# Patient Record
Sex: Male | Born: 1950
Health system: Southern US, Community
[De-identification: ages and names within clinical notes are randomized; demographics above are authoritative.]

## PROBLEM LIST (undated history)

## (undated) DIAGNOSIS — I219 Acute myocardial infarction, unspecified: Secondary | ICD-10-CM

## (undated) DIAGNOSIS — Z720 Tobacco use: Secondary | ICD-10-CM

## (undated) DIAGNOSIS — I714 Abdominal aortic aneurysm, without rupture, unspecified: Secondary | ICD-10-CM

## (undated) DIAGNOSIS — G4700 Insomnia, unspecified: Secondary | ICD-10-CM

## (undated) DIAGNOSIS — F329 Major depressive disorder, single episode, unspecified: Secondary | ICD-10-CM

## (undated) DIAGNOSIS — K259 Gastric ulcer, unspecified as acute or chronic, without hemorrhage or perforation: Secondary | ICD-10-CM

## (undated) DIAGNOSIS — D696 Thrombocytopenia, unspecified: Secondary | ICD-10-CM

## (undated) DIAGNOSIS — Z951 Presence of aortocoronary bypass graft: Secondary | ICD-10-CM

## (undated) DIAGNOSIS — I10 Essential (primary) hypertension: Secondary | ICD-10-CM

## (undated) DIAGNOSIS — F32A Depression, unspecified: Secondary | ICD-10-CM

## (undated) DIAGNOSIS — N200 Calculus of kidney: Secondary | ICD-10-CM

## (undated) DIAGNOSIS — I2510 Atherosclerotic heart disease of native coronary artery without angina pectoris: Secondary | ICD-10-CM

## (undated) DIAGNOSIS — K760 Fatty (change of) liver, not elsewhere classified: Secondary | ICD-10-CM

## (undated) DIAGNOSIS — F419 Anxiety disorder, unspecified: Secondary | ICD-10-CM

## (undated) DIAGNOSIS — E785 Hyperlipidemia, unspecified: Secondary | ICD-10-CM

## (undated) DIAGNOSIS — E119 Type 2 diabetes mellitus without complications: Secondary | ICD-10-CM

## (undated) DIAGNOSIS — N529 Male erectile dysfunction, unspecified: Secondary | ICD-10-CM

## (undated) HISTORY — PX: INGUINAL HERNIA REPAIR: SUR1180

## (undated) HISTORY — PX: LITHOTRIPSY: SUR834

## (undated) HISTORY — DX: Tobacco use: Z72.0

## (undated) HISTORY — DX: Depression, unspecified: F32.A

## (undated) HISTORY — DX: Abdominal aortic aneurysm, without rupture, unspecified: I71.40

## (undated) HISTORY — DX: Male erectile dysfunction, unspecified: N52.9

## (undated) HISTORY — DX: Major depressive disorder, single episode, unspecified: F32.9

## (undated) HISTORY — DX: Atherosclerotic heart disease of native coronary artery without angina pectoris: I25.10

## (undated) HISTORY — DX: Fatty (change of) liver, not elsewhere classified: K76.0

## (undated) HISTORY — DX: Abdominal aortic aneurysm, without rupture: I71.4

## (undated) HISTORY — DX: Calculus of kidney: N20.0

## (undated) HISTORY — DX: Hyperlipidemia, unspecified: E78.5

## (undated) HISTORY — PX: CORONARY ANGIOPLASTY WITH STENT PLACEMENT: SHX49

## (undated) HISTORY — DX: Thrombocytopenia, unspecified: D69.6

## (undated) HISTORY — DX: Insomnia, unspecified: G47.00

## (undated) HISTORY — DX: Anxiety disorder, unspecified: F41.9

---

## 1998-06-18 ENCOUNTER — Inpatient Hospital Stay (HOSPITAL_COMMUNITY): Admission: EM | Admit: 1998-06-18 | Discharge: 1998-06-21 | Payer: Self-pay | Admitting: Cardiology

## 2001-06-27 ENCOUNTER — Emergency Department (HOSPITAL_COMMUNITY): Admission: EM | Admit: 2001-06-27 | Discharge: 2001-06-27 | Payer: Self-pay | Admitting: Emergency Medicine

## 2001-06-27 ENCOUNTER — Encounter: Payer: Self-pay | Admitting: Emergency Medicine

## 2003-03-14 ENCOUNTER — Emergency Department (HOSPITAL_COMMUNITY): Admission: EM | Admit: 2003-03-14 | Discharge: 2003-03-14 | Payer: Self-pay | Admitting: *Deleted

## 2003-03-14 ENCOUNTER — Emergency Department (HOSPITAL_COMMUNITY): Admission: EM | Admit: 2003-03-14 | Discharge: 2003-03-15 | Payer: Self-pay | Admitting: *Deleted

## 2003-03-14 ENCOUNTER — Encounter: Payer: Self-pay | Admitting: *Deleted

## 2003-03-17 ENCOUNTER — Encounter: Payer: Self-pay | Admitting: Urology

## 2003-03-17 ENCOUNTER — Ambulatory Visit (HOSPITAL_COMMUNITY): Admission: RE | Admit: 2003-03-17 | Discharge: 2003-03-17 | Payer: Self-pay | Admitting: Urology

## 2003-03-22 ENCOUNTER — Encounter: Payer: Self-pay | Admitting: Urology

## 2003-03-22 ENCOUNTER — Ambulatory Visit (HOSPITAL_COMMUNITY): Admission: RE | Admit: 2003-03-22 | Discharge: 2003-03-22 | Payer: Self-pay | Admitting: *Deleted

## 2003-04-19 ENCOUNTER — Ambulatory Visit (HOSPITAL_COMMUNITY): Admission: RE | Admit: 2003-04-19 | Discharge: 2003-04-19 | Payer: Self-pay | Admitting: Urology

## 2003-04-19 ENCOUNTER — Encounter: Payer: Self-pay | Admitting: Urology

## 2003-04-26 ENCOUNTER — Encounter: Payer: Self-pay | Admitting: Urology

## 2003-04-26 ENCOUNTER — Ambulatory Visit (HOSPITAL_COMMUNITY): Admission: RE | Admit: 2003-04-26 | Discharge: 2003-04-26 | Payer: Self-pay | Admitting: Urology

## 2004-05-09 ENCOUNTER — Ambulatory Visit (HOSPITAL_COMMUNITY): Admission: RE | Admit: 2004-05-09 | Discharge: 2004-05-09 | Payer: Self-pay | Admitting: Family Medicine

## 2005-09-18 ENCOUNTER — Ambulatory Visit (HOSPITAL_COMMUNITY): Admission: RE | Admit: 2005-09-18 | Discharge: 2005-09-18 | Payer: Self-pay | Admitting: Family Medicine

## 2005-09-27 ENCOUNTER — Ambulatory Visit: Payer: Self-pay | Admitting: *Deleted

## 2005-09-28 ENCOUNTER — Ambulatory Visit (HOSPITAL_COMMUNITY): Admission: RE | Admit: 2005-09-28 | Discharge: 2005-09-28 | Payer: Self-pay | Admitting: *Deleted

## 2005-10-03 ENCOUNTER — Ambulatory Visit: Payer: Self-pay | Admitting: *Deleted

## 2005-10-09 ENCOUNTER — Ambulatory Visit (HOSPITAL_COMMUNITY): Admission: RE | Admit: 2005-10-09 | Discharge: 2005-10-09 | Payer: Self-pay | Admitting: General Surgery

## 2005-10-24 ENCOUNTER — Ambulatory Visit: Payer: Self-pay | Admitting: *Deleted

## 2006-01-09 ENCOUNTER — Encounter: Admission: RE | Admit: 2006-01-09 | Discharge: 2006-01-09 | Payer: Self-pay | Admitting: Oncology

## 2006-01-09 ENCOUNTER — Encounter (HOSPITAL_COMMUNITY): Admission: RE | Admit: 2006-01-09 | Discharge: 2006-02-08 | Payer: Self-pay | Admitting: Oncology

## 2006-01-09 ENCOUNTER — Ambulatory Visit (HOSPITAL_COMMUNITY): Payer: Self-pay | Admitting: Oncology

## 2006-02-15 ENCOUNTER — Emergency Department (HOSPITAL_COMMUNITY): Admission: EM | Admit: 2006-02-15 | Discharge: 2006-02-15 | Payer: Self-pay | Admitting: Emergency Medicine

## 2006-02-19 ENCOUNTER — Encounter: Admission: RE | Admit: 2006-02-19 | Discharge: 2006-02-19 | Payer: Self-pay | Admitting: Oncology

## 2006-02-19 ENCOUNTER — Ambulatory Visit (HOSPITAL_COMMUNITY): Admission: RE | Admit: 2006-02-19 | Discharge: 2006-02-19 | Payer: Self-pay | Admitting: Urology

## 2006-02-21 ENCOUNTER — Ambulatory Visit (HOSPITAL_COMMUNITY): Admission: RE | Admit: 2006-02-21 | Discharge: 2006-02-21 | Payer: Self-pay | Admitting: Urology

## 2006-02-26 ENCOUNTER — Ambulatory Visit (HOSPITAL_COMMUNITY): Payer: Self-pay | Admitting: Oncology

## 2006-02-26 ENCOUNTER — Ambulatory Visit (HOSPITAL_COMMUNITY): Admission: RE | Admit: 2006-02-26 | Discharge: 2006-02-26 | Payer: Self-pay | Admitting: Urology

## 2006-02-27 ENCOUNTER — Ambulatory Visit (HOSPITAL_COMMUNITY): Admission: RE | Admit: 2006-02-27 | Discharge: 2006-02-27 | Payer: Self-pay | Admitting: Urology

## 2006-03-01 ENCOUNTER — Ambulatory Visit (HOSPITAL_COMMUNITY): Admission: RE | Admit: 2006-03-01 | Discharge: 2006-03-01 | Payer: Self-pay | Admitting: Urology

## 2006-03-22 ENCOUNTER — Ambulatory Visit (HOSPITAL_COMMUNITY): Admission: RE | Admit: 2006-03-22 | Discharge: 2006-03-22 | Payer: Self-pay | Admitting: Urology

## 2006-04-03 ENCOUNTER — Ambulatory Visit: Payer: Self-pay | Admitting: Cardiology

## 2006-04-24 ENCOUNTER — Encounter (HOSPITAL_COMMUNITY): Admission: RE | Admit: 2006-04-24 | Discharge: 2006-05-24 | Payer: Self-pay | Admitting: Oncology

## 2006-04-24 ENCOUNTER — Encounter: Admission: RE | Admit: 2006-04-24 | Discharge: 2006-04-24 | Payer: Self-pay | Admitting: Oncology

## 2006-04-24 ENCOUNTER — Ambulatory Visit (HOSPITAL_COMMUNITY): Payer: Self-pay | Admitting: Oncology

## 2007-04-10 ENCOUNTER — Ambulatory Visit (HOSPITAL_COMMUNITY): Admission: RE | Admit: 2007-04-10 | Discharge: 2007-04-10 | Payer: Self-pay | Admitting: Family Medicine

## 2007-11-11 ENCOUNTER — Ambulatory Visit (HOSPITAL_COMMUNITY): Admission: RE | Admit: 2007-11-11 | Discharge: 2007-11-11 | Payer: Self-pay | Admitting: Family Medicine

## 2008-01-20 ENCOUNTER — Ambulatory Visit: Payer: Self-pay | Admitting: Cardiology

## 2008-01-20 ENCOUNTER — Inpatient Hospital Stay (HOSPITAL_COMMUNITY): Admission: AD | Admit: 2008-01-20 | Discharge: 2008-01-23 | Payer: Self-pay | Admitting: Internal Medicine

## 2008-01-22 ENCOUNTER — Encounter: Payer: Self-pay | Admitting: Internal Medicine

## 2008-01-23 ENCOUNTER — Encounter: Payer: Self-pay | Admitting: Internal Medicine

## 2008-01-23 ENCOUNTER — Ambulatory Visit: Payer: Self-pay | Admitting: Surgery

## 2008-01-29 ENCOUNTER — Ambulatory Visit: Payer: Self-pay | Admitting: Cardiology

## 2008-01-30 ENCOUNTER — Ambulatory Visit: Payer: Self-pay

## 2008-02-19 ENCOUNTER — Ambulatory Visit: Payer: Self-pay | Admitting: Cardiology

## 2008-03-17 ENCOUNTER — Ambulatory Visit: Payer: Self-pay

## 2008-04-06 ENCOUNTER — Inpatient Hospital Stay (HOSPITAL_COMMUNITY): Admission: EM | Admit: 2008-04-06 | Discharge: 2008-04-07 | Payer: Self-pay | Admitting: Emergency Medicine

## 2008-04-06 ENCOUNTER — Ambulatory Visit: Payer: Self-pay | Admitting: Cardiology

## 2008-04-07 ENCOUNTER — Encounter: Payer: Self-pay | Admitting: Cardiology

## 2008-04-12 ENCOUNTER — Emergency Department (HOSPITAL_COMMUNITY): Admission: EM | Admit: 2008-04-12 | Discharge: 2008-04-12 | Payer: Self-pay | Admitting: Emergency Medicine

## 2008-04-13 ENCOUNTER — Encounter: Payer: Self-pay | Admitting: Cardiology

## 2008-04-13 LAB — CONVERTED CEMR LAB
HCT: 45.9 %
Platelets: 105 10*3/uL

## 2008-04-17 ENCOUNTER — Emergency Department (HOSPITAL_COMMUNITY): Admission: EM | Admit: 2008-04-17 | Discharge: 2008-04-17 | Payer: Self-pay | Admitting: Emergency Medicine

## 2008-04-20 ENCOUNTER — Ambulatory Visit (HOSPITAL_COMMUNITY): Admission: RE | Admit: 2008-04-20 | Discharge: 2008-04-20 | Payer: Self-pay | Admitting: Family Medicine

## 2008-05-03 ENCOUNTER — Emergency Department (HOSPITAL_COMMUNITY): Admission: EM | Admit: 2008-05-03 | Discharge: 2008-05-03 | Payer: Self-pay | Admitting: Emergency Medicine

## 2008-05-11 ENCOUNTER — Ambulatory Visit: Payer: Self-pay | Admitting: Cardiology

## 2008-05-19 ENCOUNTER — Ambulatory Visit (HOSPITAL_COMMUNITY): Admission: RE | Admit: 2008-05-19 | Discharge: 2008-05-19 | Payer: Self-pay | Admitting: Urology

## 2008-05-26 ENCOUNTER — Ambulatory Visit (HOSPITAL_COMMUNITY): Admission: RE | Admit: 2008-05-26 | Discharge: 2008-05-26 | Payer: Self-pay | Admitting: Urology

## 2008-08-06 ENCOUNTER — Emergency Department (HOSPITAL_COMMUNITY): Admission: EM | Admit: 2008-08-06 | Discharge: 2008-08-06 | Payer: Self-pay | Admitting: Emergency Medicine

## 2008-08-13 ENCOUNTER — Emergency Department (HOSPITAL_COMMUNITY): Admission: EM | Admit: 2008-08-13 | Discharge: 2008-08-13 | Payer: Self-pay | Admitting: Emergency Medicine

## 2008-09-06 ENCOUNTER — Ambulatory Visit: Payer: Self-pay | Admitting: Cardiology

## 2008-11-25 IMAGING — CR DG ABDOMEN 1V
1 series · 1 of 1 positions shown · non-contrast
Comparison: 05/19/2008

CLINICAL DATA: Left ureteral calculus.

ABDOMEN - 1 VIEW

[view not recorded]
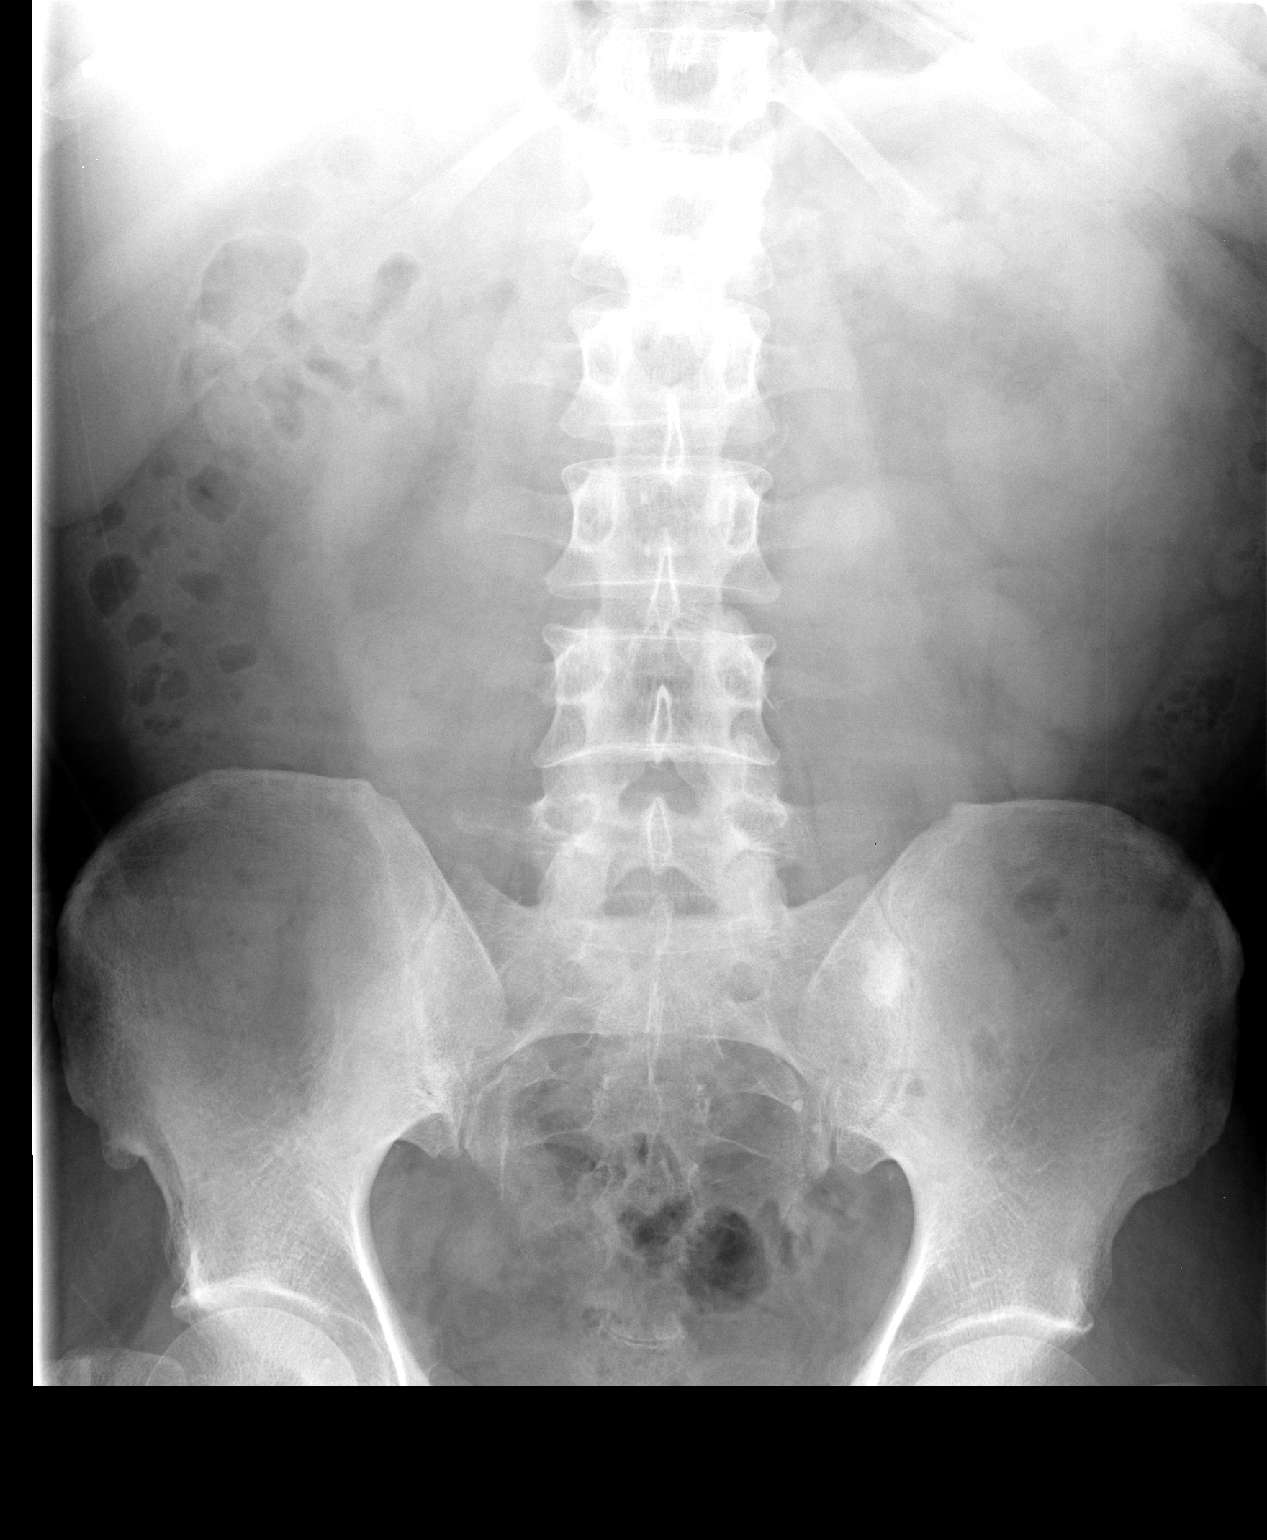

[1 of 1 positions shown; findings below may reference images not displayed]

FINDINGS: A calcific density seen in the left paraspinal region on
the prior study, is not well seen today.  Previously seen 5 x 6 mm
calcification in the left anatomic pelvis, adjacent to the left
sacral ala, is again seen.  The stones seen slightly more
inferiorly in the left anatomic pelvis on the prior study, are no
longer visualized.  There are phleboliths in the anatomic pelvis.
Bowel gas pattern unremarkable.
IMPRESSION: 1.  Of the three possible left ureteral stones seen on the prior
study, only one is still visualized, along the lateral aspect of
the left sacrum.

## 2009-03-10 ENCOUNTER — Encounter: Payer: Self-pay | Admitting: Cardiology

## 2009-03-11 LAB — CONVERTED CEMR LAB
Albumin: 4.4 g/dL (ref 3.5–5.2)
BUN: 18 mg/dL (ref 6–23)
CO2: 23 meq/L (ref 19–32)
Calcium: 9.5 mg/dL (ref 8.4–10.5)
Chloride: 106 meq/L (ref 96–112)
Glucose, Bld: 122 mg/dL — ABNORMAL HIGH (ref 70–99)
HDL: 44 mg/dL (ref >39)
Potassium: 4.7 meq/L (ref 3.5–5.3)
Triglycerides: 141 mg/dL (ref <150)

## 2009-03-14 ENCOUNTER — Encounter (INDEPENDENT_AMBULATORY_CARE_PROVIDER_SITE_OTHER): Payer: Self-pay | Admitting: *Deleted

## 2009-06-22 ENCOUNTER — Emergency Department (HOSPITAL_COMMUNITY): Admission: EM | Admit: 2009-06-22 | Discharge: 2009-06-23 | Payer: Self-pay | Admitting: Emergency Medicine

## 2009-06-25 ENCOUNTER — Emergency Department (HOSPITAL_COMMUNITY): Admission: EM | Admit: 2009-06-25 | Discharge: 2009-06-25 | Payer: Self-pay | Admitting: Emergency Medicine

## 2009-06-26 ENCOUNTER — Emergency Department (HOSPITAL_COMMUNITY): Admission: EM | Admit: 2009-06-26 | Discharge: 2009-06-26 | Payer: Self-pay | Admitting: Emergency Medicine

## 2009-11-03 ENCOUNTER — Ambulatory Visit (HOSPITAL_BASED_OUTPATIENT_CLINIC_OR_DEPARTMENT_OTHER): Admission: RE | Admit: 2009-11-03 | Discharge: 2009-11-03 | Payer: Self-pay | Admitting: Urology

## 2009-11-07 ENCOUNTER — Encounter: Payer: Self-pay | Admitting: Adult Health

## 2009-11-07 ENCOUNTER — Ambulatory Visit: Payer: Self-pay | Admitting: Cardiology

## 2009-11-07 DIAGNOSIS — I714 Abdominal aortic aneurysm, without rupture, unspecified: Secondary | ICD-10-CM

## 2009-11-07 HISTORY — DX: Abdominal aortic aneurysm, without rupture: I71.4

## 2009-11-07 HISTORY — DX: Abdominal aortic aneurysm, without rupture, unspecified: I71.40

## 2009-11-18 ENCOUNTER — Encounter: Payer: Self-pay | Admitting: Cardiology

## 2009-11-18 ENCOUNTER — Ambulatory Visit (HOSPITAL_COMMUNITY): Admission: RE | Admit: 2009-11-18 | Discharge: 2009-11-18 | Payer: Self-pay | Admitting: Cardiology

## 2009-11-18 ENCOUNTER — Ambulatory Visit: Payer: Self-pay | Admitting: Cardiology

## 2009-11-22 ENCOUNTER — Ambulatory Visit: Payer: Self-pay | Admitting: Cardiology

## 2010-06-09 ENCOUNTER — Encounter (INDEPENDENT_AMBULATORY_CARE_PROVIDER_SITE_OTHER): Payer: Self-pay | Admitting: *Deleted

## 2010-07-20 ENCOUNTER — Emergency Department (HOSPITAL_COMMUNITY)
Admission: EM | Admit: 2010-07-20 | Discharge: 2010-07-20 | Payer: Self-pay | Source: Home / Self Care | Admitting: Emergency Medicine

## 2010-07-28 ENCOUNTER — Encounter: Payer: Self-pay | Admitting: Cardiology

## 2010-07-28 LAB — CONVERTED CEMR LAB
Cholesterol: 144 mg/dL
LDL (calc): 86 mg/dL

## 2010-08-01 NOTE — Assessment & Plan Note (Signed)
Summary: f1y   Visit Type:  Follow-up Primary Provider:  Dr.Mark Nobie Putnam   History of Present Illness: Mr. Bearse is a 60 CM with known history of CAD, (s/p cardic cath in 7/09 with stent to the RCA using a BMS, with history of stent to LAD in 1999 found to be patent, with a 50-60% prox circumflex stenosis.), AAA with most recent abdominal study Dec 2010, revealing  3.6 cm focal abdominal aortic aneurysm at the distal abdominal aorta; the IMA arises from the aneurysmal dilatation; recent admission for renal calculi removal, diabetes, and anxiety.  During procedure to remove renal calculi, the patient states that the anesthesiologist thought that the EKG was concerning.  We, unfortunately do not have a copy of that recording.  He was told to make sure he followed up with his cardiologist.  He was due for an appt at this time and therefore is here for continued assessment.  He states that he has been having palpatations more frequently lately, and exertional chest discomfort over the last couple of months. However, he states that he rarely exerts himself.  He denies SOB, dizziness, radiation of discomfort.  No abdominal pain.   Current Medications (verified): 1)  Niacin 500 Mg Tabs (Niacin) .... Take 1 Tab Three Times A Day 2)  Glimepiride 4 Mg Tabs (Glimepiride) .... Take 1 Tab Daily 3)  Metoprolol Succinate 100 Mg Xr24h-Tab (Metoprolol Succinate) .... Take 1 Tab Daily 4)  Actoplus Met 15-500 Mg Tabs (Pioglitazone Hcl-Metformin Hcl) .... Take 1 Tab Two Times A Day 5)  Ramipril 10 Mg Caps (Ramipril) .... Take 1 Tab Daily 6)  Lipitor 10 Mg Tabs (Atorvastatin Calcium) .... Take 1 Tab Daily 7)  Norvasc 2.5 Mg Tabs (Amlodipine Besylate) .... Take 1 Tab Daily 8)  Aspir-Low 81 Mg Tbec (Aspirin) .... Take 1 Tab Daily 9)  Fish Oil 1000 Mg Caps (Omega-3 Fatty Acids) .... Take 1 Tab Two Times A Day  Allergies (verified): 1)  ! Demerol 2)  ! Phenergan (Promethazine Hcl)  Social History: Tobacco Use -  Yes  Alcohol Use - no Regular Exercise - no Drug Use - no  Review of Systems       The patient complains of chest pain.  The patient denies syncope.         Pain is usually with exertion. All other systems have been reviewed and are negative unless stated above.   Vital Signs:  Patient profile:   60 year old male Height:      71 inches Weight:      187 pounds BMI:     26.18 Pulse rate:   74 / minute BP sitting:   133 / 81  (right arm)  Vitals Entered By: Dreama Saa, CNA (Nov 07, 2009 4:02 PM)  Physical Exam  General:  Well developed, well nourished, in no acute distress. Neck:  Neck supple, no JVD. No masses, thyromegaly or abnormal cervical nodes. Lungs:  Clear bilaterally to auscultation and percussion. Heart:  RRR without MRG Abdomen:  No abdominal bruits are appreciated. Msk:  Back normal, normal gait. Muscle strength and tone normal. Extremities:  No clubbing or cyanosis. Neurologic:  Alert and oriented x 3. Psych:  Normal affect.   CT Scan  Procedure date:  06/25/2009  Findings:      IMPRESSION:CT SCAN PELVIS   Resolution of prior left hydronephrosis. Small nonobstructing left   renal calculi remain.  CT Scan  Procedure date:  06/25/2009  Findings:  IMPRESSION:CT SCAN ABDOMEN   2 mm calcification within the urinary bladder consistent with a   recently passed ureteral calculus presumably from the left.   EKG  Procedure date:  11/07/2009  Findings:       CLINICAL DATA:  A 60 year old gentleman with previous myocardial   infarction and percutaneous intervention, now admitted to hospital with   chest pain.   1. Treadmill exercise performed to a workload of 12.8 METS and a heart       rate of 163, 98% of age - predicted maximum.  Exercise discontinued       due to dyspnea and fatigue; no chest pain reported.   2. Blood pressure increased from a resting value of 145/80-180/80       during exercise and 210/70 early in recovery, a mildly  hypertensive       response.   3. No arrhythmias is noted.   4. EKG:  Sinus bradycardia; prominent QRS voltage; slightly delayed R-       wave progression; otherwise unremarkable.   Stress EKG:  Prominent, but insignificant, upsloping ST-segment   depression reaching a maximum of 2 mm in lead II.  Tracings reverted   towards normal within the first minute of recovery.   1. Baseline echocardiogram:  Normal chamber dimensions; no significant       valvular abnormalities; normal left ventricular size and function.      Post - exercise echocardiogram:  Increased contractility in all   myocardial segments.      IMPRESSION:  Negative stress echocardiogram revealing no significant   structural heart disease, good exercise tolerance, and neither   electrocardiographic nor echocardiographic evidence for myocardial   ischemia or infarction.  Other findings as noted.          Normal sinus rhythm with rate of:    Echocardiogram  Procedure date:  04/07/2008  Findings:       CLINICAL DATA:  A 60 year old gentleman with previous myocardial   infarction and percutaneous intervention, now admitted to hospital with   chest pain.   1. Treadmill exercise performed to a workload of 12.8 METS and a heart       rate of 163, 98% of age - predicted maximum.  Exercise discontinued       due to dyspnea and fatigue; no chest pain reported.   2. Blood pressure increased from a resting value of 145/80-180/80       during exercise and 210/70 early in recovery, a mildly hypertensive       response.   3. No arrhythmias is noted.   4. EKG:  Sinus bradycardia; prominent QRS voltage; slightly delayed R-       wave progression; otherwise unremarkable.   Stress EKG:  Prominent, but insignificant, upsloping ST-segment   depression reaching a maximum of 2 mm in lead II.  Tracings reverted   towards normal within the first minute of recovery.   1. Baseline echocardiogram:  Normal chamber dimensions; no significant         valvular abnormalities; normal left ventricular size and function.      Post - exercise echocardiogram:  Increased contractility in all   myocardial segments.      IMPRESSION:  Negative stress echocardiogram revealing no significant   structural heart disease, good exercise tolerance, and neither   electrocardiographic nor echocardiographic evidence for myocardial   ischemia or infarction.  Other findings as noted.            Impression &  Recommendations:  Problem # 1:  CORONARY ATHEROSCLEROSIS NATIVE CORONARY ARTERY (ICD-414.01) He complains of angina pain for the last couple of months.  I have discussed this with Dr. Dietrich Pates in the setting of known CAD with stents to the LAD and RCA with Cx disease.  After his assessment of the patient, review of EKG, we have elected to proceed with stress echo.  He knows to call us if chest pain becomes more prominent on occurs nonexertionally. His updated medication list for this problem includes:    Metoprolol Succinate 100 Mg Xr24h-tab (Metoprolol succinate) .Marland Kitchen... Take 1 tab daily    Ramipril 10 Mg Caps (Ramipril) .Marland Kitchen... Take 1 tab daily    Norvasc 2.5 Mg Tabs (Amlodipine besylate) .Marland Kitchen... Take 1 tab daily    Aspir-low 81 Mg Tbec (Aspirin) .Marland Kitchen... Take 1 tab daily  Orders: Stress Echo (Stress Echo)  Problem # 2:  ABDOMINAL AORTIC ANEURYSM (ICD-441.4) We have reviewed the most  3.6 cm focal abdominal aortic aneurysm at the distal abdominal   aorta; the IMA arises from the aneurysmal dilatation, increased in size from 3.1 in 2009.  We will continue to follow this.  No intervention at this time.  Patient Instructions: 1)  Your physician recommends that you schedule a follow-up appointment in: post stress echo 2)  Your physician recommends that you continue on your current medications as directed. Please refer to the Current Medication list given to you today. 3)  Your physician has requested that you have a stress echocardiogram. For further  information please visit [email or web address]  Please follow instruction sheet as given.

## 2010-08-01 NOTE — Letter (Signed)
Summary: Stress Echocardiogram Information Sheet  [facility]  [address], [place] [address]   Phone: [phone]  Fax: [phone]      Nov 07, 2009 MRN: [id number] light prior to the test.   Cain Sieve  Doctor: Appointment Date: Appointment Time: Appointment Location: [facility]  Stress Echocardiogram Information Sheet    Instructions:   1. DO NOT  take your Metoprolol, Glimepiride and Actoplus Met  the morning of test.  2. Eat light prior to the test.  3. Dress prepared to exercise.  4. DO NOT use ANY caffine or tobacco products 3 hours before appointment.  5. Report to the Short Stay Center on the1st floor.  6. Please bring all current prescription medications.  7. If you have any questions, please call [phone]

## 2010-08-01 NOTE — Assessment & Plan Note (Signed)
Summary: rov   Visit Type:  Follow-up Primary Provider:  Dr.Mark Nobie Putnam  CC:  no cardiology complaints.  History of Present Illness: [name] is a 68 CM with known history of CAD, (s/p cardic cath in 7/09 with stent to the RCA using a BMS, with history of stent to LAD in 1999 found to be patent, with a 50-60% prox circumflex stenosis.), AAA with most recent abdominal study Dec 2010, revealing  3.6 cm focal abdominal aortic aneurysm at the distal abdominal aorta; the IMA arises from the aneurysmal dilatation; recent admission for renal calculi removal, diabetes, and anxiety.  During procedure to remove renal calculi, the patient states that the anesthesiologist thought that the EKG was concerning.  We, unfortunately do not have a copy of that recording.  He was told to make sure he followed up with his cardiologist.    We saw him 1 week ago and planned a stress echo.  He continues to have "some" chest pain, with palpatations,.  He admits to being under a lot of stress with sister who is termally ill.  Current Medications (verified): 1)  Glimepiride 4 Mg Tabs (Glimepiride) .... Take 1 Tab Daily 2)  Metoprolol Succinate 100 Mg Xr24h-Tab (Metoprolol Succinate) .... Take 1 Tab Daily 3)  Actoplus Met 15-500 Mg Tabs (Pioglitazone Hcl-Metformin Hcl) .... Take 1 Tab Two Times A Day 4)  Ramipril 10 Mg Caps (Ramipril) .... Take 1 Tab Daily 5)  Lipitor 10 Mg Tabs (Atorvastatin Calcium) .... Take 1 Tab Daily 6)  Norvasc 2.5 Mg Tabs (Amlodipine Besylate) .... Take 1 Tab Daily 7)  Aspir-Low 81 Mg Tbec (Aspirin) .... Take 1 Tab Daily 8)  Fish Oil 1000 Mg Caps (Omega-3 Fatty Acids) .... Take 1 Tab Two Times A Day  Allergies (verified): 1)  ! Demerol 2)  ! Phenergan (Promethazine Hcl)  Review of Systems       Palpatations.  All other systems have been reviewed and are negative unless stated above.   Vital Signs:  Patient profile:   60 year old male Weight:      187 pounds BMI:     26.18 Pulse  rate:   75 / minute BP sitting:   130 / 68  (right arm)  Vitals Entered By: Dreama Saa, CNA (Nov 22, 2009 3:30 PM)  Physical Exam  General:  Well developed, well nourished, in no acute distress. Lungs:  Clear bilaterally to auscultation and percussion. Heart:  Non-displaced PMI, chest non-tender; regular rate and rhythm, S1, S2 without murmurs, rubs or gallops. Carotid upstroke normal, no bruit. Normal abdominal aortic size, no bruits. Femorals normal pulses, no bruits. Pedals normal pulses. No edema, no varicosities. Psych:  depressed affect.     Impression & Recommendations:  Problem # 1:  CORONARY ATHEROSCLEROSIS NATIVE CORONARY ARTERY (ICD-414.01) Review of the stress echo revealed no echocardiographic evidence for stress induced ischemia.  LVEF was 60-65%.  Reassurance was given to the patient concerning test results.  If he continues to have chest discomfort or worsening symptoms, will proceed with cardiac cath for further evaluation.  He wishes to wait to proceed with this until symptoms worsen. His updated medication list for this problem includes:    Metoprolol Succinate 100 Mg Xr24h-tab (Metoprolol succinate) .Marland Kitchen... Take 1 tab daily    Ramipril 10 Mg Caps (Ramipril) .Marland Kitchen... Take 1 tab daily    Norvasc 2.5 Mg Tabs (Amlodipine besylate) .Marland Kitchen... Take 1 tab daily    Aspir-low 81 Mg Tbec (Aspirin) .Marland Kitchen... Take 1 tab  daily  Problem # 2:  ANXIETY STATE, UNSPECIFIED (ICD-300.00) He admits to more stress in his life with sister who is terminal along with worries about his own health.  He is advised to take xanax which he has a prescription, on a as needed basis.  Counseling is also recommended if anxiety becomes unmanageble.  Patient Instructions: 1)  Your physician recommends that you schedule a follow-up appointment in: 6 months 2)  Your physician recommends that you continue on your current medications as directed. Please refer to the Current Medication list given to you today.

## 2010-08-01 NOTE — Letter (Signed)
Summary: Appointment - Reminder 2  [facility]. [address]   [place], [place] [address]   Phone: [phone]  Fax: [phone]     June 09, 2010 MRN: [id number]   [name] [address] [place], [place]  [address]   Dear [name],  Our records indicate that it is time to schedule a follow-up appointment.  Dr.  Dietrich Pates        recommended that you follow up with [place] in 11.2011 PAST DUE           . It is very important that we reach you to schedule this appointment. We look forward to participating in your health care needs. Please contact us at the number listed above at your earliest convenience to schedule your appointment.  If you are unable to make an appointment at this time, give [place] a call so we can update our records.     Sincerely,   Glass blower/designer

## 2010-08-09 ENCOUNTER — Encounter: Payer: Self-pay | Admitting: Cardiology

## 2010-08-09 ENCOUNTER — Ambulatory Visit (INDEPENDENT_AMBULATORY_CARE_PROVIDER_SITE_OTHER): Payer: PRIVATE HEALTH INSURANCE | Admitting: Cardiology

## 2010-08-09 DIAGNOSIS — D696 Thrombocytopenia, unspecified: Secondary | ICD-10-CM | POA: Insufficient documentation

## 2010-08-09 DIAGNOSIS — G4700 Insomnia, unspecified: Secondary | ICD-10-CM | POA: Insufficient documentation

## 2010-08-09 DIAGNOSIS — N200 Calculus of kidney: Secondary | ICD-10-CM

## 2010-08-09 DIAGNOSIS — N529 Male erectile dysfunction, unspecified: Secondary | ICD-10-CM | POA: Insufficient documentation

## 2010-08-09 DIAGNOSIS — E785 Hyperlipidemia, unspecified: Secondary | ICD-10-CM

## 2010-08-09 DIAGNOSIS — I2510 Atherosclerotic heart disease of native coronary artery without angina pectoris: Secondary | ICD-10-CM | POA: Insufficient documentation

## 2010-08-09 DIAGNOSIS — I10 Essential (primary) hypertension: Secondary | ICD-10-CM

## 2010-08-09 DIAGNOSIS — E1169 Type 2 diabetes mellitus with other specified complication: Secondary | ICD-10-CM | POA: Insufficient documentation

## 2010-08-09 DIAGNOSIS — E119 Type 2 diabetes mellitus without complications: Secondary | ICD-10-CM

## 2010-08-09 DIAGNOSIS — E782 Mixed hyperlipidemia: Secondary | ICD-10-CM

## 2010-08-09 HISTORY — DX: Hyperlipidemia, unspecified: E78.5

## 2010-08-09 HISTORY — DX: Thrombocytopenia, unspecified: D69.6

## 2010-08-09 HISTORY — DX: Type 2 diabetes mellitus without complications: E11.9

## 2010-08-09 HISTORY — DX: Calculus of kidney: N20.0

## 2010-08-17 ENCOUNTER — Telehealth (INDEPENDENT_AMBULATORY_CARE_PROVIDER_SITE_OTHER): Payer: Self-pay

## 2010-08-23 NOTE — Assessment & Plan Note (Signed)
Summary: [facility]   Visit Type:  Follow-up Primary Provider:  Robbie Lis   History of Present Illness: [name] returns to the office for continued assessment treatment of coronary disease and cardiovascular risk factors.  Since his last visit, he has done quite well with no chest discomfort and no dyspnea.  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  He has recently received alprazolam and zolpidem for treatment of anxiety and insomnia.  With the latter, he is able to fall asleep, but awakens in the early morning hours.  He has noted some pressure in his left upper quadrant at the costal margin that he attributes to irritation from his belt.  He was seen in the emergency department for an abdominal wall mass and told that he has a hernia.   Stress Echocardiogram  Procedure date:  11/18/2009  Findings:       Maximal heart rate during stress was 149bpm (91% of maximal predicted heart rate). The maximal predicted heart rate was 164bpm. The target heart rate was achieved. The heart rate response to stress was normal. There was a normal resting blood pressure with an appropriate response to stress. The rate-pressure product for the peak heart rate and blood pressure was Hg/min. The patient experienced no chest pain during stress. Functional capacity was normal.  Baseline:   - LV size was normal. Normal left atrium, aortic root, mitral valve,     aortic valve and RV.   - LV global systolic function was normal. The estimated LV ejection     fraction was 60% to 65%.   - Normal wall motion; no LV regional wall motion abnormalities.   Immediate post stress; treadmill 3.4mph, 14degrees; ; 3 min:   Study Conclusions    - Stress ECG conclusions: There were no significant stress     arrhythmias or conduction abnormalities. Occassional PVCs were     present, more frequent in early recovery. The stress ECG was     negative for  ischemia with insignificant rapidly upsloping ST     segment depression noted.   - Staged echo: There was no echocardiographic evidence for     stress-induced ischemia.    -  Date:  07/28/2010    Cholesterol: 144    LDL-calculated: 86    HDL: 34    Triglycerides: 696  Date:  04/13/2008    TSH: 0.62    WBC: 9.1    HGB: 15.4    HCT: 45.9    PLT: 105    MCV: 88   Current Medications (verified): 1)  Glimepiride 4 Mg Tabs (Glimepiride) .... Take 1 Tab Daily 2)  Metoprolol Succinate 100 Mg Xr24h-Tab (Metoprolol Succinate) .... Take 1 Tab Daily 3)  Actoplus Met 15-500 Mg Tabs (Pioglitazone Hcl-Metformin Hcl) .... Take 1 Tab Two Times A Day 4)  Ramipril 10 Mg Caps (Ramipril) .... Take 1 Tab Daily 5)  Lipitor 20 Mg Tabs (Atorvastatin Calcium) .... Take 1 Tablet By Mouth At Bedtime 6)  Norvasc 2.5 Mg Tabs (Amlodipine Besylate) .... Take 1 Tab Daily 7)  Aspir-Low 81 Mg Tbec (Aspirin) .... Take 1 Tab Daily 8)  Fish Oil 1000 Mg Caps (Omega-3 Fatty Acids) .... Take 1 Tab Two Times A Day 9)  Meloxicam 7.5 Mg Tabs (Meloxicam) .... Take 1 Tab Two Times A Day 10)  Alprazolam 0.5 Mg Tabs (Alprazolam) .... Take 1 Tab Three Times A Day 11)  Zolpidem Tartrate 10 Mg Tabs (Zolpidem  Tartrate) .... Take 1 Tab At Bedtime  Allergies (verified): 1)  ! Demerol 2)  ! Phenergan (Promethazine Hcl)  Comments:  Nurse/Medical Assistant: [facility] [place] also reviewed med from last ov  Past History:  PMH, FH, and Social History reviewed and updated.  Past Medical History: ASCVD: Acute MI requiring stent to LAD in 1999;  BMS to RCA in 12/2007, 60% cx+OM1 Abdominal aortic aneurysm: 3.6 cm in 06/2009 Hyperlipidemia Diabetes-no insulin Thrombocytopenia: 90,000 in 7/09 Tobacco abuse-40-50 pack years discontinued in 12/2007 Hepatic steatosis Erectile dysfunction Nephrolithiasis Anxiety/depression/insomnia  Past Surgical History: CABG Lithotripsy for  nephrolithiasis  Review of Systems       The patient complains of abdominal pain.  The patient denies weight loss, weight gain, decreased hearing, hoarseness, chest pain, syncope, dyspnea on exertion, peripheral edema, prolonged cough, and headaches.    Vital Signs:  Patient profile:   60 year old male Weight:      187 pounds BMI:     26.18 O2 Sat:      97 % on Room air Pulse rate:   74 / minute BP sitting:   144 / 85  (left arm)  Vitals Entered By: Dreama Saa, CNA (August 09, 2010 2:39 PM)  O2 Flow:  Room air  Physical Exam  General:  Mildly overweight; well developed; no acute distress:   Neck-No JVD; no carotid bruits: Lungs-No tachypnea, no rales; no rhonchi; no wheezes: Cardiovascular-normal PMI; normal S1 and S2: Abdomen-BS normal; soft and non-tender without masses or organomegaly:  Musculoskeletal-No deformities, no cyanosis or clubbing: Neurologic-Normal cranial nerves; symmetric strength and tone:  Skin-Warm, no significant lesions: Extremities-Nl distal pulses; no edema:     Impression & Recommendations:  Problem # 1:  ATHEROSCLEROTIC CARDIOVASCULAR DISEASE (ICD-429.2) [name] is asymptomatic with no apparent recurrent myocardial ischemia.  Treatment will continue to focus on optimal control of cardiovascular risk factors.  Problem # 2:  HYPERLIPIDEMIA (ICD-272.4) Recent lipid profile was good on just 10 mg of atorvastatin.  In order to lower LDL further, his dose will be increased to 20 mg q.d.  CHOL: 144 (07/28/2010)   LDL: 86 (07/28/2010)   HDL: 34 (07/28/2010)   TG: 124 (07/28/2010)  Problem # 3:  INSOMNIA (ICD-780.52) We discussed strategies for using alprazolam and zolpidem more effectively to treat his difficulty falling asleep and early morning awakening.  Problem # 4:  ? of ABDOMINAL WALL HERNIA (ICD-553.20) This appears to be a diastases recti on my exam.  I advised the patient that this is a normal variant without pathologic  significance.  I will reassess this nice gentleman in one year.  Other Orders: Durable Medical Equipment (DME)  Patient Instructions: 1)  Your physician recommends that you schedule a follow-up appointment in: 1 year 2)  Your physician has recommended you make the following change in your medication: Increase Lipitor to 20mg  by mouth at bedtime  3)  ***Please use Xanax and Zolpidem as discussed with Dr. Dietrich Pates*** 4)  You have been given a prescription for a Blood Glucosemeter machine to monitor BS daily Prescriptions: LIPITOR 20 MG TABS (ATORVASTATIN CALCIUM) take 1 tablet by mouth at bedtime  #30 x 12   Entered by:   Larita Fife Via LPN   Authorized by:   Kathlen Brunswick, MD, [facility]   Signed by:   Larita Fife Via LPN on 04/54/0981   Method used:   [facility]ically to        [facility] [phone]* (retail)  [address]       [place], [place]  [address]       Ph: [phone]       Fax: [phone]   RxID:   [id number]

## 2010-08-23 NOTE — Progress Notes (Signed)
**Note De-Identified Catlin Doria Obfuscation** Summary: NEED [facility] CALLED [facility]  Phone Note Call from Patient Call back at [phone]   Caller: PT WIFE SHARON Reason for Call: Talk to Nurse Summary of Call: WHEN PATIENT WAS HER LAST HE WAS GIVEN A PAPER TO BE ABLE TO GET FREE DIABETIC MEDS, BUT WHEN THEY GAVE IT TO THE PHARMACY THEY WERE TOLD THAT IT WAS NOT A RX AND NEED TO GET A RX CALLED IN. [facility] AND WANTS TO KNOW IF THEY WILL STILL BE FREE. Initial call taken by: Faythe Ghee,  August 17, 2010 11:13 AM  Follow-up for Phone Call        Marcelino Duster, [facility]ist at [facility], states that they do except DME orders for medical equipment and asked that we ask pt. to bring RX back to them and they will fill. Pt's wife, Jasmine December, advised. Follow-up by: Larita Fife Aleera Gilcrease LPN,  August 17, 2010 1:06 PM

## 2010-09-19 LAB — BASIC METABOLIC PANEL
CO2: 29 mEq/L (ref 19–32)
Calcium: 9.6 mg/dL (ref 8.4–10.5)
Chloride: 108 mEq/L (ref 96–112)
Creatinine, Ser: 1.04 mg/dL (ref 0.4–1.5)
Glucose, Bld: 81 mg/dL (ref 70–99)
Sodium: 144 mEq/L (ref 135–145)

## 2010-09-19 LAB — GLUCOSE, CAPILLARY: Glucose-Capillary: 132 mg/dL — ABNORMAL HIGH (ref 70–99)

## 2010-10-02 LAB — URINALYSIS, ROUTINE W REFLEX MICROSCOPIC
Bilirubin Urine: NEGATIVE
Bilirubin Urine: NEGATIVE
Glucose, UA: NEGATIVE mg/dL
Ketones, ur: NEGATIVE mg/dL
Leukocytes, UA: NEGATIVE
Leukocytes, UA: NEGATIVE
Nitrite: NEGATIVE
Nitrite: NEGATIVE
Protein, ur: NEGATIVE mg/dL
Specific Gravity, Urine: 1.015 (ref 1.005–1.030)
Specific Gravity, Urine: 1.02 (ref 1.005–1.030)
Urobilinogen, UA: 0.2 mg/dL (ref 0.0–1.0)
Urobilinogen, UA: 0.2 mg/dL (ref 0.0–1.0)
pH: 6 (ref 5.0–8.0)
pH: 6 (ref 5.0–8.0)

## 2010-10-02 LAB — BASIC METABOLIC PANEL
BUN: 14 mg/dL (ref 6–23)
CO2: 23 mEq/L (ref 19–32)
Calcium: 9.5 mg/dL (ref 8.4–10.5)
Creatinine, Ser: 0.99 mg/dL (ref 0.4–1.5)
GFR calc non Af Amer: >60 mL/min (ref >60)
Glucose, Bld: 153 mg/dL — ABNORMAL HIGH (ref 70–99)
Sodium: 140 mEq/L (ref 135–145)

## 2010-10-02 LAB — CBC
Hemoglobin: 15 g/dL (ref 13.0–17.0)
MCHC: 33.9 g/dL (ref 30.0–36.0)
Platelets: 88 10*3/uL — ABNORMAL LOW (ref 150–400)
RDW: 13.5 % (ref 11.5–15.5)

## 2010-10-02 LAB — DIFFERENTIAL
Basophils Absolute: 0 10*3/uL (ref 0.0–0.1)
Basophils Relative: 0 % (ref 0–1)
Lymphocytes Relative: 22 % (ref 12–46)
Neutro Abs: 3.4 10*3/uL (ref 1.7–7.7)
Neutrophils Relative %: 67 % (ref 43–77)

## 2010-10-02 LAB — URINE MICROSCOPIC-ADD ON

## 2010-10-17 LAB — GLUCOSE, CAPILLARY: Glucose-Capillary: 118 mg/dL — ABNORMAL HIGH (ref 70–99)

## 2010-10-17 LAB — URINALYSIS, ROUTINE W REFLEX MICROSCOPIC
Glucose, UA: NEGATIVE mg/dL
Leukocytes, UA: NEGATIVE
pH: 5.5 (ref 5.0–8.0)

## 2010-10-17 LAB — URINE MICROSCOPIC-ADD ON

## 2010-11-14 NOTE — Letter (Signed)
September 06, 2008    [name], MD  [address], [address]  [place], [place] [address]   RE:  KENLY, XIAO  MRN:  [id number]  /  DOB:  April 29, 1951   Dear Loraine Leriche,   Mr. Guerrette returns to the office for continued assessment and treatment  of coronary disease, now 10 years following acute myocardial infarction.  Unfortunately, he continues to smoke cigarettes, albeit at a reduced  rate.  His diabetes is controlled with a fairly low dose of a compound  agent.  His lipid profile has been good, but somewhat suboptimal with  elevated triglycerides and a low HDL.  Fortunately, his issues with  depression and anxiety have completely resolved.  He has returned to  work and has not required any psychoactive agents in recent months.  I  was called to give permission for him to stop antiplatelet agents for  lithotripsy, but he ended up passing small stones after temporary  treatment with Flomax.   PHYSICAL EXAMINATION:  GENERAL:  Pleasant gentleman in no acute  distress.  VITAL SIGNS:  The weight is 193, 5 pounds more than 4 months ago.  Blood  pressure 150/90, heart rate 64 and regular, respirations 14 and  unlabored.  NECK:  No jugular venous distention; no carotid bruits.  LUNGS:  Clear.  CARDIAC:  Normal first and second heart sounds, fourth heart sound  present.  ABDOMEN:  Soft and nontender; no masses; no bruits.  EXTREMITIES:  Normal distal pulses; no edema.   IMPRESSION:  Mr. Braniff is doing quite well with no symptomatic  manifestations of coronary disease.  I explained that his prognosis  would be much improved if he completely gave up tobacco products.  At  any rate, a substantial reduction is of some benefit.  Diabetic control  appears to be excellent - I have no fasting blood glucose values over  115.  You may wish to check a hemoglobin A1c level.  He has a minimal  abdominal aortic aneurysm that was checked twice in 2009 incidentally  during CT scans.  There has been no  change in the diameter, which is  around 3.2 cm.  Due to his young age, I would like for him to have a  lipid profile as close to optimal as possible.  We will start niacin 100  mg t.i.d. and attempt to titrate to 500 mg t.i.d.  A lipid profile will  be reassessed in 6 months with a return visit in 1 year.    Sincerely,      Gerrit Friends. Dietrich Pates, MD, [facility]  Electronically Signed    RMR/MedQ  DD: 09/06/2008  DT: 09/07/2008  Job #: [id number]

## 2010-11-14 NOTE — H&P (Signed)
[name] Kitchen  Guy, Thompson NO.:  [id number]   MEDICAL RECORD NO.:  [id number]          PATIENT TYPE:  INP   LOCATION:  2903                         FACILITY:  [facility]   PHYSICIAN:  Guy Friends. Dietrich Pates, MD, FACCDATE OF BIRTH:  02/11/1951   DATE OF ADMISSION:  01/20/2008  DATE OF DISCHARGE:                              HISTORY & PHYSICAL   PRIMARY CARDIOLOGIST:  Guy Friends. Dietrich Pates, MD, [place].   PRIMARY CARE PHYSICIAN:  [name], M.D.   HISTORY OF PRESENT ILLNESS:  A 60 year old Caucasian male with known  history of CAD, stent to the LAD in 1999, status post MI in 1999.  He  also has a history of diabetes.  Today, while eating an early dinner, he  felt pressure in his chest radiating into both arms with no associated  symptoms of shortness of breath, nausea, vomiting, or diaphoresis.  He  admits to having chest pain and pressure over the last couple of months  with and without exertion, usually feels it in the evening.  As a result  of this, he went to [facility] emergency room, at which time the patient  had cardiac markers cycled along with other labs, also had an EKG  completed, which revealed normal sinus rhythm without evidence of acute  ischemic event.  The patient was given Lovenox subcutaneously and  nitroglycerin sublingual and placed on a nitroglycerin patch 1-inch, and  transferred to [facility] after talking with Dr. Arvilla Thompson and accepting the patient with need for cardiac  catheterization.   REVIEW OF SYSTEMS:  Positive for chest pain, mild shortness of breath,  and generalized fatigue, otherwise negative for nausea, vomiting,  diaphoresis, or syncope.  Review of systems has been examined and found  to be negative.   PAST MEDICAL HISTORY:  Myocardial infarction in 1999.  The patient also  has a history of a stent to his LAD in 1999 as well.  He has a history  of nephrolithiasis, with lithotripsy.  He also has a history of non-  insulin-dependent diabetes, thrombocytopenia, and a triple A which is  being followed by Dr. Nobie Thompson and was recently evaluated per ultrasound  within the last 6 months.   CURRENT LABORATORIES:  Sodium 139, potassium 3.8, chloride 107, CO2 of  25, glucose 127, BUN 19, creatinine 1.13, and calcium 9.5.  Hemoglobin  15.6, hematocrit 46.0, white blood cells 7.2, and platelets 101.  EKG  revealing normal sinus rhythm without acute ST-T wave abnormalities.   CURRENT MEDICATIONS:  1. Aspirin 325 mg daily.  2. Atorvastatin 10 mg at bedtime.  3. Metoprolol 100 mg daily.  4. Actos/metformin 15/500 mg daily.  5. Ramipril 10 mg b.i.d.   ALLERGIES:  DEMEROL and PHENERGAN.   SOCIAL HISTORY:  The patient lives in [place] with his wife.  He is a  Emergency planning/management officer.  He is a 58-pack-year smoker.  Negative EtOH or drug  use.   FAMILY HISTORY:  Diabetes on his mother's side and father deceased from  lung disease.  He has siblings with no history of cardiac disease.   PHYSICAL  EXAMINATION:  GENERAL:  The patient is awake, alert, and  oriented in no acute distress.  HEENT:  Head is normocephalic and atraumatic.  Eyes, PERRLA.  Mucous  membranes of mouth pink and moist.  Tongue is midline.  NECK:  Supple without JVD or carotid bruits appreciated.  CARDIOVASCULAR:  Regular rate and rhythm without murmurs, rubs, or  gallops.  LUNGS:  Clear to auscultation.  ABDOMEN:  Soft, nontender, 2+ bowel sounds.  EXTREMITIES:  Without clubbing, cyanosis, or edema.  NEUROLOGIC:  Cranial nerves II through XII are grossly intact.   IMPRESSION:  1. Chest pain worrisome for angina in a patient with known coronary      artery disease with stent to the left anterior descending with no      EKG changes at present.  2. History of diabetes.  3. History of thrombocytopenia.  4. History of abdominal aortic aneurysm.   PLAN:  The patient has been transferred from [facility] and  will be prepared for cardiac  catheterization in the a.m.  Risks,  benefits, and description of the procedure have been discussed with the  patient and his wife.  They verbalize understanding and are willing to  proceed.  The patient in the interim will be monitored closely on  telemetry with continued lab work.  We will hold his metformin secondary  to catheterization in the morning and make further recommendation based  upon results of catheterization and the patient's response during  hospitalization.      Guy Mare. Lyman Bishop, NP      Guy Friends. Dietrich Pates, MD, [facility]  Electronically Signed    KML/MEDQ  D:  01/20/2008  T:  01/21/2008  Job:  [id number]   cc:   [name], M.D.

## 2010-11-14 NOTE — Cardiovascular Report (Signed)
NAME:  [name], [name] NO.:  [id number]   MEDICAL RECORD NO.:  [id number]          PATIENT TYPE:  INP   LOCATION:  3740                         FACILITY:  [facility]   PHYSICIAN:  Marca Ancona, MD      DATE OF BIRTH:  01-04-1951   DATE OF PROCEDURE:  01/21/2008  DATE OF DISCHARGE:                            CARDIAC CATHETERIZATION   I did this case with Dr. Shawnie Pons as a proctor.   PROCEDURE:  Left heart catheterization and left ventriculography.   INDICATION:  Unstable angina.   PROCEDURE NOTE:  After informed consent was obtained, the right groin  was sterilely prepped and draped.  1% lidocaine was used to locally  anesthetize the right groin site.  The right common femoral artery was  entered using Seldinger technique and a 5-French sheath was placed in  the artery.  The left coronary artery was engaged with the 5-French JL4  catheter and the right coronary artery was engaged with the 5-French JR4  catheter and the ventricle was entered using the 5-French pigtail  catheter.  There were no complications.   FINDINGS:  1. Left ventriculography, EF was estimated at 65%.  There was normal      wall motion.  There was 1+ mitral regurgitation.  LVEDP was 18      mmHg.  2. Coronary angiography.  The patient has a right dominant system.      The left main coronary artery had mild 20% distal stenosis.  There      was an approximately 50% proximal LAD stenosis just proximal to the      prior placed proximal LAD stent.  Within the proximal LAD stent,      there was approximately 30% in-stent restenosis.  There was 60%      ostial circumflex stenosis, which also involved the ostium of a      high obtuse marginal with approximately 60% stenosis as well.  The      RCA had a 99% midvessel stenosis, this is a long lesion.  There      were left to right collaterals.   ASSESSMENT:  There is critical mid RCA stenosis, this is likely the  patient's culprit lesion.  This  case is complicated by the patient's  history of chronic thrombocytopenia with the platelets being 101,000  today.   PLAN:  The case was discussed with the interventional cardiologist, Dr.  Riley Kill and we planned a percutaneous coronary intervention to the right  coronary artery.  Given the patient's history of low platelets, we  elected to place a bare-metal stent to allow a lesser absolute duration  of Plavix.   This heart catheterization was done under Dr. Shawnie Pons as a  proctor.      Marca Ancona, MD  Electronically Signed     DM/MEDQ  D:  01/21/2008  T:  01/22/2008  Job:  [id number]

## 2010-11-14 NOTE — Consult Note (Signed)
NAME:  NOBLE, CICALESE NO.:  [id number]   MEDICAL RECORD NO.:  [id number]          PATIENT TYPE:  EMS   LOCATION:  ED                            FACILITY:  [facility]   PHYSICIAN:  Gerrit Friends. Dietrich Pates, MD, FACCDATE OF BIRTH:  1950/08/14   DATE OF CONSULTATION:  04/06/2008  DATE OF DISCHARGE:                                 CONSULTATION   PRIMARY CARE PHYSICIAN:  [name], M.D.   CARDIOLOGIST:  Gerrit Friends. Dietrich Pates, MD.   REASON FOR REFERRAL:  Chest pain.   HISTORY OF PRESENT ILLNESS:  Mr. Gassett is a 60 year old male patient  with a history of coronary artery disease status post remote stenting in  1999 who recently presented with what sounds like non-ST-elevation  myocardial infarction.  He was treated at [facility] in July  2009.  It should be noted that our computer system is currently down and  we do not have complete access to all of his records.  I did obtain a  cath report that demonstrated 50% proximal LAD stenosis at that time  with patent stent, 50-60% proximal circumflex stenosis and mid RCA that  was subtotally occluded with collateralization.  This was treated with a  bare-metal stent to the RCA.  His EF was well preserved.  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  He has had serial followup arterial Dopplers in our  [place] office.  Those have been stable over time.  The patient  recently developed some flu-like symptoms with fevers and cough.  His  cough has been fairly nonproductive.  He saw a physician at an urgent  care facility late last week who treated him empirically with Tamiflu.  The patient complained of significant malaise throughout the weekend,  but is overall feeling better from an infectious symptom standpoint.  He  does note a history of chest pain over the last 3-4 days.  This has only  been with lying down at night.  He can only describe it as a discomfort.  At its worst, it has been 1-2/10 on a pain scale.  He denies any  associated dyspnea, nausea or diaphoresis.  His symptoms last throughout  the night and they are  intermittent.  He denies any aggravating or  alleviating factors.  He denies any exertional pain.  Last evening, he  could not sleep.  He was worried about his symptoms.  He did come to our  office, but after he was told that the schedule was full, he went to the  emergency room.  The ER physician reports that his symptoms resolved  with sublingual nitroglycerin.  The patient notes that his symptoms are  not entirely similar to what he had in July.  In July before his  myocardial infarction, he did have some exertional symptoms and he had  continuous chest pain, and he presented to the emergency room.  It  should be noted that his present symptoms were not made any worse by his  recent flu-like symptoms.   PAST MEDICAL HISTORY:  1. Coronary  disease as outlined above.  2. In addition, he has a history of diabetes mellitus.  3. Hyperlipidemia.  4. Hypertension.  5. He has a significant history of nephrolithiasis and has undergone      lithotripsy many times in the past.  6. He also has a history of thrombocytopenia.  This has been      previously evaluated by Dr. Mariel Sleet.  7. He is status post right inguinal hernia repair in the past.   HOME MEDICATIONS:  1. Aspirin 81 mg daily.  2. Glimepiride 4 mg daily.  3. Metoprolol 50 mg 3 times daily.  4. Actoplus/MET 15/500 mg b.i.d.  5. Lipitor 10 mg daily.  6. Ramipril 10 mg daily.  7. Fish oil 1 gm b.i.d.   ALLERGIES:  He thinks he has had some sort of reaction to [facility] in  the past.   SOCIAL HISTORY:  The patient lives in [place] with his wife.  He has  3 children.  He has a 35 pack year history of smoking and quit in July  2009.  He denies alcohol or drug abuse.  He works as a Psychologist, forensic at  [employer].   FAMILY HISTORY:  Insignificant for premature CAD.   His mother is still  alive at age 30.  His father died at age 8 from some type of lung  issue.  Multiple family members have diabetes mellitus.   REVIEW OF SYSTEMS:  Please see HPI.  He does have noted fevers, but has  not recorded his temperature.  He denies headaches or sore throat.  He  does note sinus congestion.  He denies rash.  He denies dysuria or  hematuria.  He does note fatigue.  He denies bright red blood per rectum  or melena.  He denies dysphagia or belching.  He does note some  cervicalgia which is worse with moving his head to the right.  He denies  orthopnea, PND or pedal edema.  He denies syncope.  He does note he has  been under quite a bit of stress recently.  He is concerned about dying.  He is also concerned about his stent closing off.  He has been on report  through the police department for some type of incident recently.  He is  concerned that most of his symptoms may be due to anxiety at this time.  Rest of the review of systems are negative.   PHYSICAL EXAMINATION:  GENERAL:  He is a well-nourished, well-developed  male in no acute distress.  VITAL SIGNS:  Blood pressure upon presentation in the emergency room  149/83, pulse 59, respirations 20, temperature not recorded, oxygen  saturation 97% on room air.  HEENT:  Normal.  NECK:  Without JVD or lymphadenopathy.  ENDOCRINE:  Without thyromegaly.  CARDIAC:  Normal S1 and S2, regular rate and rhythm without murmur.  LUNGS:  Clear to auscultation bilaterally.  SKIN:  Without rash.  ABDOMEN:  Soft, nontender with normoactive bowel sounds.  No  organomegaly.  EXTREMITIES:  No clubbing, cyanosis or edema.  MUSCULOSKELETAL:  Without joint deformity.  NEUROLOGIC:  He is alert and oriented x3.  Cranial nerves II- XII are  grossly intact.  VASCULAR:  He has no carotid artery bruits noted  bilaterally.  His dorsalis pedis and posterior tibialis pulses are 2+ on  the left.  His dorsalis pedis is trace to 1+ on the  right, his posterior  tibialis pulses are 2+ on the right.   DIAGNOSTICS:  1. Chest x-ray is pending.  2. EKG:  Normal sinus rhythm with heart rate of 61, normal axis, no      acute changes.   LABORATORY DATA:  White count 6100, hemoglobin 15, platelet count  116,000, sodium 140, potassium 4.4, BUN 14, creatinine 1.09, glucose  122.  Cardiac markers pending.   ASSESSMENT/PLAN:  1. Chest pain.  His symptoms are somewhat atypical.  He has also had      recent URI symptoms and has been treated empirically for the flu.      He also has a great deal of anxiety.  His symptoms are not entirely      like his previous angina.  The patient should be observed overnight      at [facility] and ruled out for myocardial infarction.  If      he does rule out for myocardial infarction, then we could plan on a      stress echocardiogram in the morning.  Proton pump inhibitor will      be added to his medical therapy to cover for a GI etiology.  2. Coronary artery disease status post bare-metal stenting to the      right coronary artery in July 2009 in the setting of what sounds      like a non-ST elevation myocardial infarction.  He will be      continued on aspirin and beta blocker therapy at this time.  3. Hyperlipidemia.  He will be continued on Lipitor.  4. Diabetes mellitus.  His oral hypoglycemics will be continued per      his primary care service.  5. Thrombocytopenia.  This has been evaluated in the past by Dr.      Mariel Sleet.  His platelet count seems to be stable.  6. 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  This seems to be stable overall.  He has been followed      closely by Dr. Riley Kill in [place].   DISPOSITION:  Thank you very much for consultation.  We will follow the  patient throughout the remainder of this admission.      Tereso Newcomer, PA-C      Gerrit Friends. Dietrich Pates, MD, [facility]  Electronically Signed    SW/MEDQ  D:   04/06/2008  T:  04/06/2008  Job:  [id number]   cc:   Rhae Lerner. Margretta Ditty, M.D.  [address]  [place]  [place] [address]

## 2010-11-14 NOTE — Letter (Signed)
May 11, 2008    [name], MD  [address], [address]  [place], [place] [address]   RE:  [name], [name]  MRN:  [id number]  /  DOB:  [birth year]   Dear [name],   [name] returns to the office for continued assessment and treatment  of coronary artery disease today.  As you know, he has had a very  difficult time of that in recent weeks.  He has had an episode  consistent with influenza for which he received antiviral agents.  He  has completely recovered from those symptoms.  He subsequently developed  severe anxiety and has been unable to work.  He was seen by a  psychiatrist at [facility] on one occasion, but was unable to  appear for his scheduled followup visit.  He has improved substantially  with alprazolam 0.5 mg q.i.d. and Lexapro 10 mg daily.  He reports no  chest discomfort or dyspnea.  He developed flank pain and was found to  have recurrent nephrolithiasis.  He is scheduled to see Dr. Rito Ehrlich  tomorrow for further evaluation of that issue.   CURRENT MEDICATIONS:  1. Glimepiride 4 mg daily.  2. Metoprolol 50 mg b.i.d.  3. Actos/metformin 15/500 mg b.i.d.  4. Ramipril 10 mg daily.  5. Atorvastatin 10 mg daily.  6. Aspirin 81 mg daily.  7. Fish oil 4000 mg daily.  8. Lexapro 10 mg daily.  9. Flomax 0.4 mg nightly.  10.Alprazolam 0.5 mg q.6 h.   PHYSICAL EXAMINATION:  GENERAL:  Pleasant gentleman in no acute  distress.  VITAL SIGNS:  The weight is 188, stable.  Blood pressure 135/80, heart  rate 65 and regular, and respirations 14.  NECK:  No jugular venous distention; normal carotid upstrokes without  bruits.  LUNGS:  Clear.  CARDIAC:  Normal first and second heart sounds; fourth heart sound  present.  ABDOMEN:  Soft and nontender; no bruits; no masses; no organomegaly.  EXTREMITIES:  No edema.   A recent lipid profile was obtained at work.  This shows a total  cholesterol 136, HDL 41, triglycerides of 208, and LDL of 53.   IMPRESSION:  Mr.  Thompson is doing well from a cardiovascular standpoint.  Unfortunately, he has numerous other health challenges.  I encouraged  him to schedule a followup with his psychiatrist.  He appears to be  doing well with his current therapy, but wonders if he should be  working.  I told him that I was afraid that there would be significant  liability performing his work while taking moderate doses of  benzodiazepines.  He will follow up with you for these issues.  Control  of hyperlipidemia and other cardiovascular risk factors is good.  I will  reassess this nice gentleman in 4 months.    Sincerely,      Gerrit Friends. Dietrich Pates, MD, [facility]  Electronically Signed    RMR/MedQ  DD: 05/11/2008  DT: 05/12/2008  Job #: [id number]

## 2010-11-14 NOTE — Cardiovascular Report (Signed)
NAME:  [name], [name] NO.:  [id number]   MEDICAL RECORD NO.:  [id number]          PATIENT TYPE:  INP   LOCATION:  2903                         FACILITY:  [facility]   PHYSICIAN:  Arturo Morton. Riley Kill, MD, FACCDATE OF BIRTH:  02/09/1951   DATE OF PROCEDURE:  DATE OF DISCHARGE:                            CARDIAC CATHETERIZATION   INDICATIONS:  Mr. Strohecker is a 60 year old who underwent diagnostic  cardiac catheterization by Dr. Marca Ancona.  This revealed  approximately 50% proximal LAD disease with a patent stent previously  placed in 1999.  He had probably 50-60% proximal circumflex stenosis as  well.  There was subtotal occlusion of the mid right coronary with left  to right collaterals and preserved overall LV function.  The patient had  a platelet count of 101,000.  The patient tells me that he has had a  workup by Dr. Mariel Sleet.  We called Dr. Thornton Papas office.  He has not  had inordinate bleeding.  The patient has remained generally stable.  As  a result, and given his presentation with non-ST-elevation MI, it was  felt that we should proceed with percutaneous intervention.  This was  discussed with the patient and the patient was agreeable to proceed.   PROCEDURE:  Percutaneous stenting of the right coronary artery using a  non-drug-eluting platform.   DESCRIPTION OF PROCEDURE:  The procedure was performed by Dr. Clifton James  with me in attendance.  The right sheath was in place, bivalirudin was  given according to protocol, and oral clopidogrel was administered.  The  patient had previous chewable aspirin.  A right guiding catheter was  utilized, JR-4.  A Cougar wire was used to cross into the distal vessel.  There was nice crossing and a 2.25 x 12 apex balloon was then passed  across the lesion and the lesion was predilated.  There was a nice  angiographic appearance.  We then passed a 15 x 2.75 mm Multi-Link  Vision stent.  This was deployed at between 14 and 15  atmospheres with  an excellent appearance.  A 3.25 x 12 [place] balloon was then taken to the  lesion, and deployed at 16 atmospheres.  There was a nice step-up and  step-down with an excellent angiographic appearance.  Following this,  Dr. Clifton James prepped the right groin.  A femoral angiogram was  performed.  An Angio-Seal closure device was then deployed, but  continued to ooze after the deployment.  Direct manual compression was  then applied to the site, and the patient taken to the holding area  where Dr. Clifton James accompanied the patient to continue direct manual  compression.   ANGIOGRAPHIC DATA:  The right coronary artery demonstrates a subtotal  occlusion near the junction of the proximal and mid vessel.  Just distal  to this, there is diffuse luminal irregularity with multiple areas of 20-  30% narrowing.  In addition, as noted with a diagnostic study, there are  some diffuse distal luminal irregularities.  No  critical obstruction is noted.  Following percutaneous intervention,  there is a step-up and step-down with a negative 10%  stenosis.  Final  MLD is optimal.  There is no evidence of edge tear.   The patient will be continued to be held in the holding area.      Arturo Morton. Riley Kill, MD, [facility]  Electronically Signed     TDS/MEDQ  D:  01/21/2008  T:  01/22/2008  Job:  [id number]   cc:   [name], M.D.  Gerrit Friends. Dietrich Pates, MD, [facility]

## 2010-11-14 NOTE — H&P (Signed)
NAME:  [name], [name]               ACCOUNT NO.:  [id number]   MEDICAL RECORD NO.:  [id number]          PATIENT TYPE:  AMB   LOCATION:  DAY                           FACILITY:  [facility]   PHYSICIAN:  Ky Barban, M.D.DATE OF BIRTH:  26-Jan-1951   DATE OF ADMISSION:  DATE OF DISCHARGE:  LH                              HISTORY & PHYSICAL   CHIEF COMPLAINT:  Left renal colic.   HISTORY:  This patient who has history of multiple kidney stones went to  the emergency room on November 2 at [facility].  He is a 60 years old  with a left renal colic.  CT scan that showed there were 2 stones in the  left ureter with distal stone measuring 5 x 7 x 9 at the level of the  iliac crossover, moderate left hydroureter.  There is a 5-mm stone in  the left inter-pole collecting system.  A 2-mm nonobstructing calculus  in the right renal collecting system.  So, in the past, he had stone  basket and lithotripsies done.  I have told him that I would recommend  to go ahead and have ESL done, so we will try to break the lower stone  on the left ureter to see if it comes out.   PAST HISTORY:  He has non-insulin-dependent diabetes and also has open  heart surgery.  He had myocardial infarction in 1998, he subsequently  had a coronary angioplasty done with stent placement.  He was taking  blood thinner, but it has been discontinued, and the only medicine he is  taking is aspirin.  I have called his cardiologist and spoke to Dr.  Dietrich Pates, he said that it is okay to proceed and do his lithotripsy and  he assured me he is not taking any Plavix or any other blood thinner.  So we have discontinued his aspirin 3 days before his scheduled ESL.   ALLERGIES:  He is allergic to DEMEROL and PHENERGAN.   PERSONAL HISTORY:  Does not smoke or drink.   REVIEW OF SYSTEMS:  Unremarkable.   PHYSICAL EXAMINATION:  GENERAL:  Well-nourished, well-developed male not  in acute distress, fully conscious, alert, oriented.  VITAL SIGNS:  Blood pressure is 130/80 and temperature is normal.  CENTRAL NERVOUS SYSTEM:  Negative.  HEAD, NECK, EYE, AND ENT:  Negative.  CHEST:  Symmetrical.  HEART:  Regular sinus rhythm, no murmur.  ABDOMEN:  Soft and flat.  Liver, spleen, and kidneys are not palpable.  No CVA tenderness  EXTERNAL GENITALIA:  Unremarkable.   IMPRESSION:  1. Left distal ureteral calculi, left renal calculus, and has tiny      right renal calculus.  2. Non-insulin-dependent diabetes.   PLAN:  ESL, distal left ureteral calculus.      Ky Barban, M.D.  Electronically Signed     MIJ/MEDQ  D:  05/18/2008  T:  05/19/2008  Job:  [id number]

## 2010-11-14 NOTE — H&P (Signed)
NAME:  [name], [name] NO.:  [id number]   MEDICAL RECORD NO.:  [id number]          PATIENT TYPE:  INP   LOCATION:  A303                          FACILITY:  [facility]   PHYSICIAN:  Skeet Latch, DO    DATE OF BIRTH:  Sep 30, 1950   DATE OF ADMISSION:  04/06/2008  DATE OF DISCHARGE:  LH                              HISTORY & PHYSICAL   PRIMARY CARE PHYSICIAN:  Dr. Nobie Putnam.   PRIMARY CARDIOLOGIST:  Dr. Dietrich Pates.   HISTORY OF PRESENT ILLNESS:  This is a 60 year old Caucasian male who  has a history of coronary artery disease with 2 stents, 1 in 1999, last  one done this past summer, who presents with some chest discomfort.  The  patient was treated at [facility] back in January 2009 with I  believe chest pain.  Had his second stent placed.  The EF seems to be  preserved.  The patient states that he had some dull chest discomfort  the last 2 days, has progressively gotten worse.  The patient states,  last night, he could not sleep, upset worrying about his history of  coronary artery disease with stent placement, and decided to come to the  emergency room to be evaluated.  The patient also complains of flu-like  symptoms and went to Urgent Care 4 to 5 days ago.  For his flu-like  symptoms was placed on Tamiflu, and states that his flu-like symptoms  have improved.  On my exam, the patient states that he still has some  discomfort, but rates it 1/10.   PAST MEDICAL HISTORY:  Includes coronary artery disease with stent  placement, hyperlipidemia, diabetes, hypertension, nephrolithiasis,  thrombocytopenia.   PAST SURGICAL HISTORY:  Two stent placements, hernia right inguinal  repair, multiple lithotripsies.   HOME MEDICATIONS:  1. Aspirin 81 mg daily.  2. Loperamide 40 mg daily.  3. Metoprolol tartrate 50 mg t.i.d.  4. Actos Plus Met 50/500 mg twice a day.  5. Ramipril 10 mg daily.  6. Lipitor 10 mg daily.  7. Fish oil 1000 mg b.i.d.   ALLERGIES:  The  patient is not sure if he is allergic to Phenergan.   SOCIAL HISTORY:  Lives in [place] with his wife.  Has 3 children.  He  has a 35-pack-year history of smoking.  Quit in 2009 in July.  No  history of alcohol or illicit drug use.   FAMILY HISTORY:  States his mother has diabetes.   REVIEW OF SYSTEMS:  Please see HPI.   PHYSICAL EXAM:  VITAL SIGNS:  Temperature is 98.2, pulse 61,  respirations 18, blood pressure 143/76, satting 95% on room air.  GENERAL:  He is awake and alert, well-developed, well-nourished well-  hydrated in no acute distress.  HEENT:  Normocephalic, atraumatic.  Eyes PERRLA, EOMI.  No scleral  icterus.  NECK:  Soft, supple, nontender, nondistended.  HEART:  Regular rate and rhythm.  No murmurs, rubs, or gallops.  LUNGS:  Clear to auscultation bilaterally.  No rhonchi, rales, or  wheezing.  ABDOMEN:  Soft, nontender, nondistended.  No rigidity or guarding.  No  hepatosplenomegaly.  EXTREMITIES:  He has trace edema to the bilateral lower extremities.  No  clubbing or cyanosis.  No erythema.   LABS:  White count is 6.1, hemoglobin 15.0, hematocrit 44.5, platelet  count 116,000.  Total creatinine kinase 55, CK-MB 0.8, troponin 0.02.  Total bilirubin 1.0, direct bilirubin 0.2, alkaline phosphatase 656, AST  21, ALT 30, total protein 6.5, albumin 3.7.  Sodium 140, potassium 4.4,  chloride 104, CO2 21, glucose 122, BUN 14, creatinine 1.09, calcium 9.5.  Chest x-ray showed chronic lung changes and mild cardiac enlargement.  No acute pulmonary findings.   ASSESSMENT:  1. Atypical chest pain.  2. History of coronary artery disease with stent placement.  3. History of type 2 diabetes.  4. History of hypertension.  5. History of hypercholesterolemia.   PLAN:  1. The patient will be admitted to service of [facility] to telemetry      unit.  2. For his chest pain, Cardiology is seeing the patient.  The patient      will be on nitro as needed for any pain.  Will  add IV pain      medication in case pain returns and is severe.  3. The patient scheduled to be n.p.o. at midnight for stress test.  4. Coronary artery disease.  The patient continue aspirin as well as      beta blocker and Statin, and ACE inhibitor at this time.  5. Diabetes.  The patient will be placed on sliding scale with night      coverage.  The patient will get CBG q.a.c. and      at bedtime.  6. Hypertension and hyperlipidemia.  The patient will continue his      home medications.  7. The patient will have DVT as well as GI prophylaxis.      Skeet Latch, DO  Electronically Signed     SM/MEDQ  D:  04/06/2008  T:  04/06/2008  Job:  [id number]   cc:   [name], M.D.  Fax: [phone]   Gerrit Friends. Dietrich Pates, MD, [facility]  [address]  [place], [place] [address]

## 2010-11-14 NOTE — Letter (Signed)
February 19, 2008    [name], MD  [address]  [place], [place] [address]   RE:  TAVARIS, EUDY  MRN:  [id number]  /  DOB:  Jan 02, 1951   Dear Loraine Leriche,   Mr. Waterson returns to the office for continued assessment and treatment  of coronary artery disease, now 48-month following a non-Q myocardial  infarction.  He continues to do well from a cardiac standpoint with no  chest discomfort nor dyspnea.  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  He wonders whether he can resume sexual  activity with the help of Cialis.   His other medications include,  1. Glimepiride 4 mg daily.  2. Metoprolol 50 mg b.i.d.  3. ActoMet 15/500 mg b.i.d.  4. Ramipril 10 mg daily.  5. Atorvastatin 10 mg daily.  6. Clopidogrel 75 mg daily.  7. Aspirin 81 mg daily.   He has chronic thrombocytopenia.  His last platelet count was 90,000 in  July 2009.  His catheterization was complicated by stenosis of the right  femoral artery due to his closure device.  A repeat ultrasound study at  the end of July 2009 showed no change in this problem.  A subsequent  followup study is planned in September 2009.   PHYSICAL EXAMINATION:  GENERAL:  A pleasant proportionate young  gentleman in no acute distress.  VITAL SIGNS:  The weight is 187, 2 pounds more than in July 2009.  Blood  pressure 135/80, heart rate 60 and regular, and respirations 16.  NECK:  No jugular venous distention; no carotid bruits.  LUNGS:  Clear.  CARDIAC:  Normal first and second heart sounds; fourth heart sound  present.  ABDOMEN:  Soft and nontender; no organomegaly.  EXTREMITIES:  No bruit over the right femoral artery; somewhat decreased  distal pulses on the right; normal on the left.   IMPRESSION:  Mr. Camey is doing well.  His CBC and chemistry profile  will be repeated.  His last lipid profile was only a month ago and was  excellent.  Atorvastatin will be continued.  Clopidogrel will be  discontinued.   I will see this nice gentleman again in 3 months.     Sincerely,      Gerrit Friends. Dietrich Pates, MD, [facility]  Electronically Signed    RMR/MedQ  DD: 02/19/2008  DT: 02/20/2008  Job #: [id number]

## 2010-11-14 NOTE — Discharge Summary (Signed)
NAME:  [name], [name] NO.:  [id number]   MEDICAL RECORD NO.:  [id number]          PATIENT TYPE:  INP   LOCATION:  A303                          FACILITY:  [facility]   PHYSICIAN:  Osvaldo Shipper, MD     DATE OF BIRTH:  03-09-1951   DATE OF ADMISSION:  04/06/2008  DATE OF DISCHARGE:  10/07/2009LH                               DISCHARGE SUMMARY   Please review H and P dictated by Dr. Lilian Kapur for details regarding the  patient's presenting illness.   PRIMARY CARE PHYSICIAN:  The patient's PMD is Dr. Nobie Putnam.   CARDIOLOGIST:  Dr. Dietrich Pates.   DISCHARGE DIAGNOSES:  1. Chest pain atypical for coronary artery disease.  2. History of coronary artery disease status post PTCA.  3. Dyslipidemia.  4. Diabetes type 2.  5. Hypertension.   BRIEF HOSPITAL COURSE:  Briefly, this is a 60 year old Caucasian male  who presented to the hospital with complaints of chest pain.  He had  undergone a PTCI with stent placement to his right coronary artery back  in July of 2009.  The patient sees Dr. Dietrich Pates at [facility].  The patient's EKG did not show any acute findings.  He ruled out for  acute coronary syndrome by serial cardiac enzymes.  He underwent a  stress echocardiogram yesterday which I was told was negative and so Dr.  Dietrich Pates cleared him for discharge yesterday evening and the patient  went home last night.   DISCHARGE MEDICATIONS:  1. Include aspirin 81 mg daily.  2. Glimepiride 4 mg daily.  3. Metoprolol 50 mg t.i.d.  4. ACTOplus/met 15/500 b.i.d.  5. Avapro 10 mg daily.  6. Lipitor 10 mg daily.  7. Fish oil b.i.d.   FOLLOWUP:  Follow up with Dr. Dietrich Pates as determined by the group, with  PMD as needed.   DIET:  Heart-healthy, modified carbohydrate.   PHYSICAL ACTIVITY:  As before.   HOSPITALIZATION STUDIES:  Include a chest x-ray which revealed chronic  lung changes and mild cardiomegaly but no acute findings.   TOTAL TIME DISCHARGE ENCOUNTER:  Less  than 30 minutes.      Osvaldo Shipper, MD  Electronically Signed     GK/MEDQ  D:  04/08/2008  T:  04/08/2008  Job:  [id number]   cc:   Gerrit Friends. Dietrich Pates, MD, [facility]  [address]  [place], [place] [address]   [name], M.D.  Fax: [phone]

## 2010-11-14 NOTE — Procedures (Signed)
NAMEHERSHEL, CORKERY NO.:  [id number]   MEDICAL RECORD NO.:  [id number]          PATIENT TYPE:  INP   LOCATION:  A303                          FACILITY:  [facility]   PHYSICIAN:  Gerrit Friends. Dietrich Pates, MD, FACCDATE OF BIRTH:  Nov 08, 1950   DATE OF PROCEDURE:  04/07/2008  DATE OF DISCHARGE:  04/07/2008                                ECHOCARDIOGRAM   REFERRING PHYSICIAN:  Osvaldo Shipper, MD.   CLINICAL DATA:  A 60 year old gentleman with previous myocardial  infarction and percutaneous intervention, now admitted to hospital with  chest pain.  1. Treadmill exercise performed to a workload of 12.8 METS and a heart      rate of 163, 98% of age - predicted maximum.  Exercise discontinued      due to dyspnea and fatigue; no chest pain reported.  2. Blood pressure increased from a resting value of 145/80-180/80      during exercise and 210/70 early in recovery, a mildly hypertensive      response.  3. No arrhythmias is noted.  4. EKG:  Sinus bradycardia; prominent QRS voltage; slightly delayed R-      wave progression; otherwise unremarkable.  Stress EKG:  Prominent, but insignificant, upsloping ST-segment  depression reaching a maximum of 2 mm in lead II.  Tracings reverted  towards normal within the first minute of recovery.  1. Baseline echocardiogram:  Normal chamber dimensions; no significant      valvular abnormalities; normal left ventricular size and function.   Post - exercise echocardiogram:  Increased contractility in all  myocardial segments.   IMPRESSION:  Negative stress echocardiogram revealing no significant  structural heart disease, good exercise tolerance, and neither  electrocardiographic nor echocardiographic evidence for myocardial  ischemia or infarction.  Other findings as noted.      Gerrit Friends. Dietrich Pates, MD, [facility]  Electronically Signed     RMR/MEDQ  D:  04/08/2008  T:  04/09/2008  Job:  [id number]

## 2010-11-14 NOTE — Letter (Signed)
January 29, 3008    [name], M.D.  [address]  [place], [place] [address]   RE:  Guy, Thompson  MRN:  [id number]  /  DOB:  [birth year]   Dear Loraine Leriche:   [name] returns to the office following recent admission to [facility] for unstable angina.  Cardiac catheterization revealed moderate  stenosis of the left anterior descending and circumflex with a total  obstruction of the right.  The right was opened and stented with a bare-  metal stent.  [name] has chronic thrombocytopenia, which was mild.  Unfortunately, he was noted to have problems with the right femoral  artery closure site.  An ultrasound study revealed stenosis of the  vessel related to the closure device with an intraluminal density,  perhaps a thrombus.  His typical post stent anticoagulation with Plavix  and aspirin was thought to be adequate therapy.  He is scheduled for  repeat ultrasound tomorrow.  He reports no symptoms either  cardiopulmonary or related to the right lower extremity.   PHYSICAL EXAMINATION:  Pleasant young gentleman in no acute distress.  The lungs are clear.  Cardiac examination is unremarkable.  There is minimal bruising in the right inguinal region with a short  early systolic bruits and a good femoral pulse.  Both dorsalis pedis  pulses are absent, but both posterior tibial pulses are normal, both by  physical exam and by Doppler.   IMPRESSION:  [name] is stable following catheterization and  percutaneous intervention.  The importance of not interrupting Plavix  therapy was explained to him.  The vascular surgeon who saw him in the  hospital thought that this would resolve spontaneously.  I will reassess  this nice gentleman in 3 weeks.  He can return to work in 1 week.    Sincerely,      Gerrit Friends. Dietrich Pates, MD, [facility]  Electronically Signed    RMR/MedQ  DD: 01/29/2008  DT: 01/30/2008  Job #: [id number]

## 2010-11-14 NOTE — Discharge Summary (Signed)
NAME:  [name], [name] NO.:  [id number]   MEDICAL RECORD NO.:  [id number]          PATIENT TYPE:  INP   LOCATION:  3740                         FACILITY:  [facility]   PHYSICIAN:  Arturo Morton. Riley Kill, MD, FACCDATE OF BIRTH:  09/14/1950   DATE OF ADMISSION:  01/20/2008  DATE OF DISCHARGE:  01/23/2008                               DISCHARGE SUMMARY   PRIMARY CARDIOLOGIST:  Gerrit Friends. Dietrich Pates, MD, [facility]   PRIMARY CARE PHYSICIAN:  [name], MD   PROCEDURES PERFORMED DURING HOSPITALIZATION:  1. Cardiac catheterization per Dr. Shawnie Pons completed on January 21, 2008.      a.     Critical mid right coronary artery stenosis of 99% in the       mid vessel otherwise nonobstructive coronary artery disease with       30% in-stent stenosis of the left anterior descending.  2. Status post 2.75 x 50-mm Vision stent multilength bare-metal stent      placement per Dr. Tonny Bollman.  3. Diabetes.  4. Thrombocytopenia.  5. Ongoing tobacco abuse.  6. History of abdominal aortic aneurysm.  7. Right groin bruit.      a.     Revealing some narrowing at the Angio-Seal site with 4 msec       jet.   HOSPITAL COURSE:  This is a 60 year old Caucasian male with known  history of CAD and stent to the LAD in 1999 and status post MI in 1999  with history of diabetes who while eating dinner felt pressure in his  chest radiating into both arms with no associated symptoms of shortness  of breath, nausea, or vomiting.  However, the patient admitted to having  chest pain and pressure over the last couple months without exertion  usually feeling in the evening.  The patient went to [facility]  Emergency Room and had cardiac markers cycled along with other labs and  EKG completed which was normal at that time; however, the patient had  had recurrent discomfort in the [facility] and it was felt to be benefit  to the patient to be transferred to [facility].  The patient  was given  Lovenox subcutaneously, nitroglycerin, and placed on the  nitroglycerin drip prior to transfer and was arriving at [facility] for  followup and planned cardiac catheterization.   The patient did arrive at [facility] on January 21, 2008 and was followed  by Dr. Marca Ancona on arrival after being seen initially by Dr. [place] Bing.  The patient was made comfortable.  The patient was without  complaints of pain on the nitroglycerin.  The patient had cardiac  catheterization planned the following morning for evaluation of known  CAD.  Cardiac catheterization did reveal a 20% distal stenosis and 50%  proximal LAD proximal to the prior stent with 30% in-stent stenosis of  the proximal LAD and 60% proximal circumflex with a 99% mid right  coronary artery long lesion with left-to-right collaterals.  The case  was reviewed by Dr. Riley Kill and felt the patient would benefit  from PCI  to the right coronary artery.  The patient did undergo PCI of the right  coronary artery using a 2.75 x 15-mm Vision non-drug-eluting stent.  The  patient recovered well without evidence of infection or hematoma.  On  followup check post catheterization 24 hours later, the patient was  noted to have a right groin bruit.  Doppler studies were completed to  evaluate for pseudoaneurysm which was negative.  However, the patient  was kept overnight to evaluate the patient further with a followup  ultrasound in 10 days for re-evaluation.  The patient was instructed on  noting for coolness or discoloration in the lower extremities and  evaluation of pulse in that extremity at home.  The patient will be sent  home on Plavix and aspirin for his outpatient Plavix to be taken for  approximately 1 month.   LABORATORY DATA ON DISCHARGE:  Hemoglobin 15.9, hematocrit 47.8, white  blood cells 5.2, platelets 90, cholesterol 119, lipids 127, HDL 32, LDL  62, sodium 141, potassium 3.9, chloride 107, CO2 29, glucose 96, BUN 10,  and  creatinine 1.17.   VITAL SIGNS ON DISCHARGE:  Blood pressure 135/86, heart rate 64,  respirations 20, temperature 98% on room air.   DISCHARGE MEDICATIONS:  1. Plavix 75 mg 1 p.o. daily x30 days.  2. Aspirin 325 mg daily.  3. Lipitor 80 mg daily.  4. Lopressor 50 mg twice a day.  5. Ramipril 10 mg daily.  6. Nitroglycerin 0.4 mg p.r.n. chest discomfort.  7. Actos metformin 15/500 daily to start 24 hours after discharge.  8. Loperamide 4 mg daily.   ALLERGIES:  To PHENERGAN.   FOLLOWUP PLANS AND APPOINTMENT:  1. The patient is to follow up with Dr. [place] Bing in [place]      on February 13, 2008, at 10:15 a.m.  2. The patient is to have a BMET and a CBC 1 week post discharge.  3. The patient is to follow up with primary care physician for      continued medical management.  4. The patient is has been given discharge instructions on cardiac      catheterization with particular emphasis on the right groin site      for checking all pulses, signs of hematoma, bleeding, infection, or      for coolness in the extremities.   TIME SPENT WITH THE PATIENT TO INCLUDE PHYSICIAN TIME:  1 hour.      Bettey Mare. Lyman Bishop, NP      Arturo Morton. Riley Kill, MD, [facility]  Electronically Signed    KML/MEDQ  D:  02/15/2008  T:  02/16/2008  Job:  [id number]

## 2010-11-16 ENCOUNTER — Ambulatory Visit (INDEPENDENT_AMBULATORY_CARE_PROVIDER_SITE_OTHER): Payer: PRIVATE HEALTH INSURANCE | Admitting: Internal Medicine

## 2010-11-16 DIAGNOSIS — R195 Other fecal abnormalities: Secondary | ICD-10-CM

## 2010-11-16 DIAGNOSIS — Z7982 Long term (current) use of aspirin: Secondary | ICD-10-CM

## 2010-11-17 NOTE — H&P (Signed)
NAME:  [name], [name]                         ACCOUNT NO.:  [id number]   MEDICAL RECORD NO.:  [id number]                   PATIENT TYPE:  AMB   LOCATION:  DAY                                  FACILITY:  [facility]   PHYSICIAN:  Dennie Maizes, M.D.                DATE OF BIRTH:  [birth year]   DATE OF ADMISSION:  03/17/2003  DATE OF DISCHARGE:                                HISTORY & PHYSICAL   CHIEF COMPLAINT:  Severe left flank pain radiating to the front, mild  hematuria.   HISTORY OF PRESENT ILLNESS:  This 60 year old male has a past history of  recurrent lithiasis for about 10 years. He experienced the sudden onset of  severe left flank pain, radiating to the front. This was associated with  mild intermittent hematuria. He denied having any fever, chills, or voiding  difficulty. He had been to the emergency room x2 at [facility].  A  contrast spiral CT scan of the abdomen and pelvis has been done. This  revealed multiple bilateral small renal calculi. There is a 1 cm size left  proximal ureteral calculus with obstruction. There is left hydronephrosis  with perinephric stranding. The patient has not passed the stone. He still  has intermittent moderate flank pain. He was brought to the day hospital  today for cystoscopy, left retrograde pyelogram and left ureteral stent  placement.   PAST MEDICAL HISTORY:  History of recurrent lithiasis, status post ESL x2,  status post ureteroscopic stone extraction. Status post right inguinal  hernia repair. History of coronary artery disease and diabetes mellitus.  History of acute MI in 1999. Status post coronary artery stent placement.   MEDICATIONS:  Amaryl, Glucophage, Lipitor. Toprol XL, Altace and aspirin.  The patient does not remember the doses. The aspirin has been stopped for  the surgery and lithotripsy.   ALLERGIES:  None.   PHYSICAL EXAMINATION:  VITAL SIGNS:  Height 5'11, weight 186 pounds.  HEENT:  Normal.  NECK:  No  masses.  LUNGS:  Clear to auscultation.  HEART:  Regular rate and rhythm. No murmurs.  ABDOMEN:  Soft. No palpable flank mass. Moderate left costovertebral angle  tenderness was noted.  GENITOURINARY:  Bladder not palpable. Penis and testes are normal.  RECTAL:  Benign prostate about 30 g in size.   IMPRESSION:  Left upper ureteral calculus with obstruction, left  hydronephrosis, bilateral renal calculi, left renal colic.    PLAN:  Cystoscopy, left retrograde pyelogram and left ureteral stent  placement under anesthesia in the hospital. I have discussed with the  patient regarding the diagnosis, operative details, outcome, alternate  treatments, possible risks and complications and he has agreed for the  procedure to be done.  The obstructing left upper ureteral calculus will  later undergo ESL as an outpatient.  Dennie Maizes, M.D.    SK/MEDQ  D:  03/16/2003  T:  03/16/2003  Job:  [id number]   cc:   [name], M.D.  [address]  [place]  [place] [address]  Fax: [phone]

## 2010-11-17 NOTE — Op Note (Signed)
NAME:  [name], [name] NO.:  [id number]   MEDICAL RECORD NO.:  [id number]          PATIENT TYPE:  AMB   LOCATION:  DAY                           FACILITY:  [facility]   PHYSICIAN:  Dennie Maizes, M.D.   DATE OF BIRTH:  Apr 13, 1951   DATE OF PROCEDURE:  02/21/2006  DATE OF DISCHARGE:                                 OPERATIVE REPORT   PREOPERATIVE DIAGNOSIS:  Large left upper ureteral calculi with obstruction,  left hydronephrosis, left renal colic.   POSTOPERATIVE DIAGNOSIS:  Large left upper ureteral calculi with  obstruction, left hydronephrosis, left renal colic.   OPERATIVE PROCEDURE:  Cystoscopy, left retrograde pyelogram and left  ureteral stent placement.   ANESTHESIA:  General.   SURGEON:  Dennie Maizes, M.D.   COMPLICATIONS:  None.   DRAINS:  4.8 x 26 cm size left ureteral stent.   SPECIMEN:  None.   INDICATIONS FOR PROCEDURE:  This 60 year old male has a history of recurrent  urolithiasis in the past.  He was evaluated for severe left flank pain.  His  x-rays revealed three large left upper ureteral calculi with obstruction and  hydronephrosis.  The calculi measured 10 x 9, 10 x 8 and 6 x 5 mm in size.  The patient was taken to the OR today for cystoscopy, left retrograde  pyelogram and left ureteral stent placement.  Obstructing calculi will later  be treated ESL as an outpatient.   DESCRIPTION OF PROCEDURE:  General anesthesia was induced and the patient  was placed on the OR table in the dorsal lithotomy position.  The lower  abdomen and genitalia were prepped and draped in a sterile fashion.  Cystoscopy was done with the 25-French scope.  The urethra, bladder, and  prostate were normal.  There was no abnormality inside the bladder.  A 5-  French catheter was then placed in the left ureteral orifice.  About 7 mL of  Renografin 60 was injected into the collecting system and retrograde  pyelogram was done.  The distal ureter was normal.  The large  filling defect  in the left upper ureter suggestive of large stones.  There was proximal  hydronephrosis.   A 5-French open-ended catheter was then placed in the left distal ureter.  A  0.038 inch Bentson guide with a flexible tip was then advanced into the  ureter.  Obstruction was noted at the level of the stones and this guidewire  could not be passed beyond the stones.  The Bentson guide was then there was  then removed.  A Glidewire was then inserted and this could be passed into  the renal pelvis without any difficulty.  The open-ended catheter was then  pushed over the guidewire above the level of the stone.  The Glidewire was  then removed and a 0.038-inch Bentson guidewire was inserted into the  collecting system through the open-ended catheter.  The open-ended catheter  was then removed.  The 4.8 French 26 cm size stent was then inserted to the  left collecting system.  A 6-French stent could not be inserted due to  obstruction  at the level of the stone.  The instruments were then removed.  The patient was transferred to the PACU in a satisfactory condition.   The obstructing left upper ureteral calculi will later be treated with ESL  as an outpatient.      Dennie Maizes, M.D.  Electronically Signed     SK/MEDQ  D:  02/21/2006  T:  02/21/2006  Job:  [id number]   cc:   [name], M.D.  Fax: [phone]

## 2010-11-17 NOTE — H&P (Signed)
NAME:  [name], [name] NO.:  [id number]   MEDICAL RECORD NO.:  [id number]          PATIENT TYPE:  AMB   LOCATION:  DAY                           FACILITY:  [facility]   PHYSICIAN:  Dennie Maizes, M.D.   DATE OF BIRTH:  03/23/1951   DATE OF ADMISSION:  02/26/2006  DATE OF DISCHARGE:  LH                                HISTORY & PHYSICAL   CHIEF COMPLAINT:  Severe left flank pain radiating to the front, left  ureteral calculi with obstruction, status post left ureteral stent  placement.   HISTORY OF PRESENT ILLNESS:  This is 60 year old male has a history of  recurrent urolithiasis in the past.  He has had kidney stones for over 12  years.  He has undergone several lithotripsies as well as ureteroscopic  stone extractions in the past.  He has passed three stones recently.  He  experienced sudden onset of severe left flank pain radiating to the front.  He came to the emergency room at [facility] on February 15, 2006.  He  was evaluated with CT scan of the abdomen and pelvis.  This revealed  multiple stones in the left kidney as well as a 9 by 8 mm size stone in the  upj area.  He could not pass the stone.  He had persistent severe left flank  pain.  There is no history of fever, chills, or hematuria.  An x-ray in the  KUB area revealed three large stones in the proximal left ureter.  The  stones measured 10 by 9 mm, 10 by 8 mm, and 6 by 5 mm in size.  The patient  has undergone cystoscopy, left retrograde pyelogram, and left ureteral stent  placement on February 21, 2006.  He is brought to the short stay center today  for extracorporeal shock wave lithotripsy of the left ureteral calculi.   PAST MEDICAL HISTORY:  Type 2 diabetes mellitus. Recurrent urolithiasis  status post multiple ESWLs and ureteroscopic stone extractions. Coronary  artery disease status post acute MI in 1998.  He has undergone angioplasty  as well as stent placement in the past.   MEDICATIONS:   Amaryl, Metformin, Altace, Toprol, Lipitor, aspirin which has  been stopped for the surgery.   ALLERGIES:  DEMEROL AND PHENERGAN.   PHYSICAL EXAMINATION:  HEENT:  Normal.  NECK:  No masses.  LUNGS:  Clear to auscultation.  HEART:  Regular rate and rhythm, no murmurs.  ABDOMEN:  Soft, no palpable flank mass or costovertebral angle tenderness.  Bladder not palpable.  GU:  Penis and testes are normal.   IMPRESSION:  Left upper ureteral calculus with obstruction, left  hydronephrosis, left renal colic, status post left ureteral stent placement.   PLAN:  Extracorporeal shock wave lithotripsy of the left ureteral calculi  with IV sedation in day hospital.  I have discussed with the patient  regarding the diagnosis, operative details, alternate treatments, outcome,  possible risks and complications, and he has agreed for the procedure to be  done.  The patient has multiple large stones.  He may require more than  one  session of treatment.  This has been explained to the patient in detail.      Dennie Maizes, M.D.  Electronically Signed     SK/MEDQ  D:  02/26/2006  T:  02/26/2006  Job:  [id number]   cc:   [name], M.D.  Fax: [phone]

## 2010-11-17 NOTE — H&P (Signed)
NAME:  JEARL, SOTO NO.:  [id number]   MEDICAL RECORD NO.:  [id number]          PATIENT TYPE:  AMB   LOCATION:  DAY                           FACILITY:  [facility]   PHYSICIAN:  Dennie Maizes, M.D.   DATE OF BIRTH:  01/31/1951   DATE OF ADMISSION:  02/21/2006  DATE OF DISCHARGE:  LH                                HISTORY & PHYSICAL   CHIEF COMPLAINT:  Severe left flank pain radiating into the front  ,left  ureteral calculi   HISTORY OF PRESENT ILLNESS:  This is a 60 year old male with a past history  of recurrent urolithiasis.  He has had kidney stones for about 12 years.He  has  undergone several lithotripsies as well as ureteroscopic stone  extraction in the past.  He passed 3 stones recently.  He experienced sudden  onset of severe left flank pain radiating to the front.  He went to the  emergency room at [facility] on February 15, 2006.  Non contrast CT  Scan revealed multiple stones in the left kidney as well as a 9 x 8 mm size  stone in the ureteropelvic junction.  The patient has not passed the stones.  He has persistentsevere left flank pain.  He did no have any fever, chills  or gross hematuria.  A x-ray of the KUB area was done on February 19, 2006.  This revealed the 3 large stones in the proximal left ureter.  The stones  measured 9 x 10 mm, 10 x 8 mm and 6 x 5 mm in size.  The patient is brought  to the short stay center today for cystoscopy, left retrograde pyelogram and  left ureteral stent placement.  Obstructing stones will later be treated  with ESL as an outpatient.   PAST MEDICAL HISTORY:  1. History of type 2 diabetes mellitus.  2. Recurrent urolithiasis, status post multiple ESLs and ureteroscopic      stone extractions.  3. Coronary artery disease, status post acute MI in 1998.  The patient has      undergone angioplasty as well as stent placement.   MEDICATIONS:  Amaryl, metformin, Altace, Toprol, Lipitor, aspirin (which has  been  stopped for the surgery).   ALLERGIES:  DEMEROL and PHENERGAN.   PHYSICAL EXAMINATION:  HEENT:  Normal.  NECK:  No masses.  LUNGS:  Clear to auscultation.  HEART:  Regular rate and rhythm, no murmurs.  ABDOMEN:  Soft, no palpable flank mass.  Moderate costovertebral angle  tenderness is noted.  Bladder is not palpable.  GENITOURINARY:  Penis and testes are normal.   IMPRESSION:  1. Left upper ureteral calculi with obstruction.  2. Left hydronephrosis.  3. Left renal colic.   PLAN:  Cystoscopy, left retrograde pyelogram, left ureteral stent placement  with anesthesia in short stay center.  I have discussed with the patient  regarding the diagnosis, operative details, alternate treatments, outcome,  possible risks and complications, and he has agreed for the procedure to be  done.  Obstructing calculi will later be treated with ESL as an outpatient.  Dennie Maizes, M.D.  Electronically Signed     SK/MEDQ  D:  02/21/2006  T:  02/21/2006  Job:  [id number]   cc:   [name], M.D.  Fax: [phone]   [facility]  Fax: [phone]

## 2010-11-17 NOTE — Op Note (Signed)
NAME:  [name], [name]                         ACCOUNT NO.:  [id number]   MEDICAL RECORD NO.:  [id number]                   PATIENT TYPE:  AMB   LOCATION:  DAY                                  FACILITY:  [facility]   PHYSICIAN:  Dennie Maizes, M.D.                DATE OF BIRTH:  01-Dec-1950   DATE OF PROCEDURE:  03/17/2003  DATE OF DISCHARGE:  03/17/2003                                 OPERATIVE REPORT   PREOPERATIVE DIAGNOSES:  1. Left upper ureteral calculus.  2. Left hydronephrosis.  3. Left renal colic.   POSTOPERATIVE DIAGNOSES:  1. Left upper ureteral calculus.  2. Left hydronephrosis.  3. Left renal colic.   OPERATIVE PROCEDURES:  1. Cystoscopy.  2. Left retrograde pyelogram.  3. Left ureteral stent placement.   ANESTHESIA:  General.   SURGEON:  Dennie Maizes, M.D.   COMPLICATIONS:  None.   DRAINS:  6 French 26 cm size left ureteral stent.   INDICATIONS FOR THE PROCEDURE:  This 60 year old male was evaluated for left  renal colic.  He had a 1 cm sized left proximal ureteral calculus with  obstruction.  Also had left hydronephrosis and paranephric stranding.  He  was taken to the OR today for a cystoscopy, left retrograde pyelogram, and  left ureteral stent placement.   DESCRIPTION OF PROCEDURE:  General anesthesia was induced, and the patient  was placed on the OR table in the dorsal lithotomy position.  The lower  abdomen and genitalia were prepped and draped in a sterile fashion.  Cystoscopy was done with the 24 [place] scope.  The urethra, prostate, and  bladder were normal.  No abnormality was noted inside the bladder.  A 5  French rigid catheter was then placed in the left ureteral orifice.  About 7  mL of Renografin 60 was injected into the collecting system, and a  retrograde pyelogram was done.  This revealed a large filling defect in the  left upper ureter.   An open-ended catheter was then placed in the left distal ureter.  A 0.038  tension Bentson  guidewire with the flexible tip was then advanced into the  left renal pelvis without any difficulty.  The open-ended catheter was  removed.  A 6 French 26 cm size stent was then inserted into the left  collecting system.  The instruments are removed.  The patient was  transferred to the PACU in a satisfactory condition.      ___________________________________________                                            Dennie Maizes, M.D.   SK/MEDQ  D:  05/23/2003  T:  05/23/2003  Job:  [id number]

## 2010-11-17 NOTE — Letter (Signed)
April 03, 2006     [name], M.D.  [address], [address]  [place], [place] [address]   RE:  MACKEY, VARRICCHIO  MRN:  [id number]  /  DOB:  [birth year]   Dear Loraine Leriche,   Mr. Helt returns to the office for control of cardiovascular risk factors  now eight years following acute myocardial infarction for which a stent was  placed in to the left anterior descending artery.  He is doing well from a  cardiac standpoint.  He has no chest discomfort nor dyspnea.  Unfortunately,  he continues to smoke cigarettes.  He recently had significant problems with  a recurrent nephrolithiasis requiring lithotripsy.  Diabetic control has  recently been improved with modification of his medical regimen.  His last  lipid profile from March was good except for elevated triglycerides which  may be related to his hemoglobin A1c value of 8.5 at that time.  He is being  followed by Dr. Mariel Sleet for thrombocytopenia, which is mild.  He was found  to have a very small abdominal aortic aneurysm during the past year.   CURRENT MEDICATIONS:  1. Include aspirin 325 mg daily.  2. Atorvastatin 10 mg daily.  3. Metoprolol 100 mg daily.  4. Actos/metformin 15/500 mg daily.  5. Ramipril 10 mg b.i.d.   On exam, youthful, pleasant gentleman in no acute distress.  The weight is  179-stable.  Blood pressure 120/80, heart rate 70 and regular, respirations  16.  NECK:  No jugular venous distention; normal carotid upstrokes without  bruits.  LUNGS:  Clear.  CARDIAC:  Normal first and second heart sounds.  ABDOMEN:  Soft and nontender.  No organomegaly.  EXTREMITIES:  No edema.   IMPRESSION:  Mr. Saric is doing generally well except for cigarette smoking.  He has a nicotine inhaler and will start to use this in the near future.  His lipid profile is suboptimal but was obtained when diabetes was under  less good control.  A hemoglobin A1c level will be reassessed as well as a  chemistry profile.  We will also add  fish oil two capsules b.i.d. to his  medical regimen.   His abdominal aortic aneurysm is miniscule and will not need additional  imaging for perhaps two years.  We will decrease his dose of aspirin to 81  mg daily and Ramipril to 10 mg daily.  He will follow blood pressures and  return in seven months for reassessment.    Sincerely,       Gerrit Friends. Dietrich Pates, MD, [facility]     RMR/MedQ  DD:  04/03/2006  DT:  04/05/2006  Job #:  [id number]

## 2010-11-17 NOTE — H&P (Signed)
NAME:  [name], [name] NO.:  [id number]   MEDICAL RECORD NO.:  [id number]          PATIENT TYPE:  AMB   LOCATION:                                FACILITY:  [facility]   PHYSICIAN:  Dalia Heading, M.D.  DATE OF BIRTH:  [birth year]   DATE OF ADMISSION:  DATE OF DISCHARGE:  LH                                HISTORY & PHYSICAL   CHIEF COMPLAINT:  Need for screening colonoscopy.   HISTORY OF PRESENT ILLNESS:  The patient is a 60 year old white male who was  referred for endoscopic evaluation.  He needs a colonoscopy for screening  purposes.  No abdominal pain, weight loss, nausea, vomiting, diarrhea,  constipation, melena or hematochezia been noted.  He has never had a  colonoscopy.  No family history colon carcinoma.   PAST MEDICAL HISTORY:  Hypertension and non-insulin-dependent diabetes  mellitus.   PAST SURGICAL HISTORY:  Kidney stone removal.   CURRENT MEDICATIONS:  Amaryl, Toprol, metformin, Lipitor, Altace, baby  aspirin.   ALLERGIES:  No known drug allergies.   REVIEW OF SYSTEMS:  The patient smokes a pack cigarettes a day.  He denies  alcohol use.  He denies any other cardiopulmonary difficulties or bleeding  disorders.   PHYSICAL EXAMINATION:  GENERAL:  On physical examination, the patient is a  well-developed, well-nourished white male in no acute distress.  LUNGS:  Clear to auscultation with equal breath sounds bilaterally.  HEART:  Heart examination reveals a regular rate rhythm without S3, S4,  murmurs.  ABDOMEN:  The abdomen is soft, nontender, nondistended.  No  hepatosplenomegaly or masses noted.  RECTAL:  Rectal examination was deferred to the procedure.   IMPRESSION:  Need for screening colonoscopy.   PLAN:  The patient is scheduled for colonoscopy on October 09, 2005.  Risks  and benefits of the procedure including bleeding and perforation were fully  explained to the patient, who gave informed consent.      Dalia Heading, M.D.  Electronically Signed     MAJ/MEDQ  D:  09/25/2005  T:  09/25/2005  Job:  [id number]   cc:   [facility]  Fax: [phone]   [name], M.D.  Fax: [phone]

## 2010-11-21 ENCOUNTER — Encounter (HOSPITAL_BASED_OUTPATIENT_CLINIC_OR_DEPARTMENT_OTHER): Payer: PRIVATE HEALTH INSURANCE | Admitting: Internal Medicine

## 2010-11-21 ENCOUNTER — Other Ambulatory Visit (INDEPENDENT_AMBULATORY_CARE_PROVIDER_SITE_OTHER): Payer: Self-pay | Admitting: Internal Medicine

## 2010-11-21 ENCOUNTER — Ambulatory Visit (HOSPITAL_COMMUNITY)
Admission: RE | Admit: 2010-11-21 | Discharge: 2010-11-21 | Disposition: A | Discharge status: Home / Self Care | Payer: PRIVATE HEALTH INSURANCE | Source: Ambulatory Visit | Attending: Internal Medicine | Admitting: Internal Medicine

## 2010-11-21 DIAGNOSIS — Z860100 Personal history of colon polyps, unspecified: Secondary | ICD-10-CM | POA: Insufficient documentation

## 2010-11-21 DIAGNOSIS — R198 Other specified symptoms and signs involving the digestive system and abdomen: Secondary | ICD-10-CM | POA: Insufficient documentation

## 2010-11-21 DIAGNOSIS — Z7982 Long term (current) use of aspirin: Secondary | ICD-10-CM | POA: Insufficient documentation

## 2010-11-21 DIAGNOSIS — K921 Melena: Secondary | ICD-10-CM

## 2010-11-21 DIAGNOSIS — D126 Benign neoplasm of colon, unspecified: Secondary | ICD-10-CM | POA: Insufficient documentation

## 2010-11-21 DIAGNOSIS — K644 Residual hemorrhoidal skin tags: Secondary | ICD-10-CM

## 2010-11-21 DIAGNOSIS — E119 Type 2 diabetes mellitus without complications: Secondary | ICD-10-CM | POA: Insufficient documentation

## 2010-11-21 LAB — GLUCOSE, CAPILLARY: Glucose-Capillary: 140 mg/dL — ABNORMAL HIGH (ref 70–99)

## 2010-12-10 ENCOUNTER — Emergency Department (HOSPITAL_COMMUNITY)
Admission: EM | Admit: 2010-12-10 | Discharge: 2010-12-10 | Disposition: A | Discharge status: Home / Self Care | Payer: PRIVATE HEALTH INSURANCE | Attending: Emergency Medicine | Admitting: Emergency Medicine

## 2010-12-10 ENCOUNTER — Emergency Department (HOSPITAL_COMMUNITY): Payer: PRIVATE HEALTH INSURANCE

## 2010-12-10 DIAGNOSIS — E119 Type 2 diabetes mellitus without complications: Secondary | ICD-10-CM | POA: Insufficient documentation

## 2010-12-10 DIAGNOSIS — I10 Essential (primary) hypertension: Secondary | ICD-10-CM | POA: Insufficient documentation

## 2010-12-10 DIAGNOSIS — N200 Calculus of kidney: Secondary | ICD-10-CM | POA: Insufficient documentation

## 2010-12-10 DIAGNOSIS — N390 Urinary tract infection, site not specified: Secondary | ICD-10-CM | POA: Insufficient documentation

## 2010-12-10 DIAGNOSIS — F17200 Nicotine dependence, unspecified, uncomplicated: Secondary | ICD-10-CM | POA: Insufficient documentation

## 2010-12-10 LAB — BASIC METABOLIC PANEL
CO2: 29 mEq/L (ref 19–32)
Chloride: 104 mEq/L (ref 96–112)
Glucose, Bld: 162 mg/dL — ABNORMAL HIGH (ref 70–99)
Potassium: 4.4 mEq/L (ref 3.5–5.1)
Sodium: 141 mEq/L (ref 135–145)

## 2010-12-10 LAB — URINALYSIS, ROUTINE W REFLEX MICROSCOPIC
Glucose, UA: NEGATIVE mg/dL
Protein, ur: >300 mg/dL — AB
pH: 6.5 (ref 5.0–8.0)

## 2010-12-10 LAB — DIFFERENTIAL
Basophils Relative: 1 % (ref 0–1)
Eosinophils Absolute: 0.6 10*3/uL (ref 0.0–0.7)
Lymphs Abs: 1.8 10*3/uL (ref 0.7–4.0)
Monocytes Absolute: 0.6 10*3/uL (ref 0.1–1.0)
Monocytes Relative: 7 % (ref 3–12)
Neutro Abs: 5.1 10*3/uL (ref 1.7–7.7)

## 2010-12-10 LAB — CBC
Hemoglobin: 14.3 g/dL (ref 13.0–17.0)
MCH: 29.8 pg (ref 26.0–34.0)
MCHC: 32.8 g/dL (ref 30.0–36.0)
MCV: 90.8 fL (ref 78.0–100.0)

## 2010-12-10 LAB — URINE MICROSCOPIC-ADD ON

## 2010-12-11 NOTE — Consult Note (Addendum)
NAME:  [name], [name] NO.:  [id number]  MEDICAL RECORD NO.:  [id number]           PATIENT TYPE:  LOCATION:                                 FACILITY:  PHYSICIAN:  [name], M.D.    DATE OF BIRTH:  11/29/1950  DATE OF CONSULTATION:  11/16/2010 DATE OF DISCHARGE:                                CONSULTATION   REASON FOR CONSULT:  Change in his stools, rectal bleeding.  HISTORY OF PRESENT ILLNESS:  [name] is a 60 year old white male referred to our office by Dr. Phillips Odor.  He states that his stools have been flat for 6-7 months.  He also states that he noticed pinkish blood on the toilet tissue when he wipes.  He denies similar symptoms in the past.  He has had no weight loss. His appetite is good.  He does complain of left flank pain at times.  He does have a history of kidney stones and he wears a gun belt which he thinks may aggravate this pain.  He does state this flank pain is worse after eating a large meal.  There is no family history of colon cancer; however, he does have a sister that is deceased at age 57 from ovarian cancer.  His last colonoscopy was in 2007 by Dr. Lovell Sheehan which revealed a small pale, hyperplastic polyp in the rectum.  Colonoscopy, otherwise normal.  He did have several other small hyperplastic polyps that were fulgurated in the rectum.  Per medical record, there is no pathology report on these polyps.  He denies any melena.  No fever, fatigue, dysphagia.  ALLERGIES:  No known allergies.  SURGERIES:  He had two cardiac stents.  One was placed in 1999, the other in 2009.  He did have an MI in 1999.  He had a right hernia repair in 1986.  He has had lithotripsy in the past multiple times for kidney stones.  Medical history includes an MI in 1999, history of multiple kidney stones and he has an aortic aneurysm which is being observed at present. Recently, this month he underwent a cystoscopy with removal of a prostatic ureteral  stone.  He had been a diabetic type 2 since 1997.  FAMILY HISTORY:  His mother is alive with CAD and a pacemaker at age 36. Father is deceased from lung disease.  Six sisters, five are alive in good health, two, however, have diabetes type 2, one sister is deceased from ovarian cancer at age 65.  He has three brothers, two are in good health, one has diabetes.  He is married.  He is a Emergency planning/management officer.  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  He smokes about a half- a-pack a day for over 30 years.  He does not drink or do drugs, and he has two children in good health.  HOME MEDICATIONS: 1. Fish oil twice a day. 2. Maalox again 7.5 twice a day. 3. Actoplus Met 50/500 mg twice a day. 4. Metoprolol 100 mg a day. 5. Ramipril 10 mg a day. 6. Norvasc 2.5 a day. 7. Lipitor 20 mg day. 8. Aspirin 81  mg a day.  OBJECTIVE:  His weight is 187, his height is 5 feet 11 inches, his temperature is 97, his blood pressure is 114/70, his pulse is 72.  He has natural teeth in good condition.  His oral mucosa is moist.  There are no lesions.  His conjunctivae are pink.  His sclerae are anicteric. His thyroid is normal.  There is no cervical lymphadenopathy.  His lungs are clear.  Heart regular rate and rhythm.  His abdomen is soft.  Bowel sounds are positive.  No masses and no tenderness.  His stool is brown today, guaiac positive.  There is no edema to his lower extremities.  ASSESSMENT:  [name] is a 60 year old male presenting today with complaints of rectal bleeding and change in stool caliber.  His stools are flat.  He is guaiac-positive today.  I did discuss this case with Dr. Karilyn Cota.  A colonic neoplasm or polyp needs to be ruled out as well as an arteriovenous malformation.  He has been on chronic nonsteroidal antiinflammatory drug therapy.  RECOMMENDATIONS:  Will schedule a colonoscopy next week.  We will also get a CBC on him today as a baseline.  The benefits and risks were  reviewed with the patient as he is agreeable.     ______________________________ Dorene Ar, NP   ______________________________ [name], M.D.    TS/MEDQ  D:  11/16/2010  T:  11/16/2010  Job:  [id number]  cc:   Dr. Phillips Odor.  Electronically Signed by Dorene Ar PA on 11/24/2010 09:36:40 AM Electronically Signed by [name] M.D. on 12/11/2010 11:19:03 AM

## 2010-12-11 NOTE — Op Note (Signed)
  NAME:  [name] NO.:  [id number]  MEDICAL RECORD NO.:  [id number]           PATIENT TYPE:  O  LOCATION:  DAYP                          FACILITY:  [facility]  PHYSICIAN:  [name], M.D.    DATE OF BIRTH:  07-14-1950  DATE OF PROCEDURE:  11/21/2010 DATE OF DISCHARGE:                              OPERATIVE REPORT   PROCEDURE:  Colonoscopy.  INDICATIONS:  [name] is a 60 year old Caucasian male who presents with few month history of change in his bowel habits.  He is passing flat stools.  He has also noted scant amount of blood with his bowel movements.  He is undergoing diagnostic colonoscopy.  Family history is negative for colorectal carcinoma.  Procedure risks were reviewed with the patient and informed consent was obtained.  MEDICATIONS FOR CONSCIOUS SEDATION: 1. Demerol 50 mg IV. 2. Versed 6 mg IV.  FINDINGS:  Procedure performed in endoscopy suite.  The patient's vital signs and O2 sats were monitored during the procedure and remained stable.  The patient was placed in left lateral position and rectal examination performed.  No abnormality noted on external or digital exam.  Pentax videoscope was placed in the rectum and advanced under vision into sigmoid colon and beyond.  Preparation was excellent.  Scope was passed into cecum which was identified by appendiceal orifice and ileocecal valve.  Short segment was also examined was normal.  As the scope was withdrawn, the colonic mucosa was carefully examined.  In the proximal sigmoid colon, there were three small polyps 3-4 mm, these were ablated via cold biopsy and submitted in one container and the distal sigmoid colon.  There was another 4-mm polyp which was ablated via cold biopsy and submitted in separate container.  Mucosa in the rest of the colon was normal.  Rectal mucosa similarly was normal.  The scope was retroflexed to examine anorectal junction and small hemorrhoids noted below the  dentate line.  Endoscope was withdrawn.  Withdrawal time was 12 minutes.  The patient tolerated the procedure well.  FINAL DIAGNOSIS:  Normal terminal ileum.  Four small polyps ablated via cold biopsy, three from proximal sigmoid colon and fourth one from distal sigmoid.  External hemorrhoids.  RECOMMENDATIONS:  High-fiber diet plus fiber supplement 3-4 g daily.  Anusol-HC suppository one per rectum at bedtime daily for 2 weeks, prescription given for 14 with one refill.  I will be contacting the patient with results of biopsy and further recommendations.          ______________________________ [name], M.D.     NR/MEDQ  D:  11/21/2010  T:  11/21/2010  Job:  [id number]  cc:   Corrie Mckusick, M.D. Fax: [phone]  Electronically Signed by [name] M.D. on 12/11/2010 11:19:21 AM

## 2010-12-12 LAB — URINE CULTURE
Colony Count: NO GROWTH
Culture  Setup Time: 201206101939
Culture: NO GROWTH

## 2010-12-15 ENCOUNTER — Ambulatory Visit (INDEPENDENT_AMBULATORY_CARE_PROVIDER_SITE_OTHER): Payer: PRIVATE HEALTH INSURANCE | Admitting: Urology

## 2010-12-15 DIAGNOSIS — N200 Calculus of kidney: Secondary | ICD-10-CM

## 2010-12-15 DIAGNOSIS — N201 Calculus of ureter: Secondary | ICD-10-CM

## 2011-03-30 LAB — CBC
HCT: 46
HCT: 47.8
MCV: 86
Platelets: 101 — ABNORMAL LOW
Platelets: 76 — ABNORMAL LOW
Platelets: 90 — ABNORMAL LOW
RDW: 14
WBC: 7.2
WBC: 5.2
WBC: 5.7

## 2011-03-30 LAB — LIPID PANEL
Cholesterol: 109
Triglycerides: 127
VLDL: 25

## 2011-03-30 LAB — HEMOGLOBIN A1C: Mean Plasma Glucose: 129

## 2011-03-30 LAB — COMPREHENSIVE METABOLIC PANEL
ALT: 20
AST: 16
Albumin: 3.6
BUN: 15
CO2: 26
Calcium: 9
Chloride: 107
Chloride: 109
Creatinine, Ser: 1.07
Creatinine, Ser: 1.17
GFR calc Af Amer: >60
GFR calc non Af Amer: >60
Potassium: 3.9
Sodium: 141
Total Bilirubin: 0.9
Total Bilirubin: 1

## 2011-03-30 LAB — CARDIAC PANEL(CRET KIN+CKTOT+MB+TROPI)
CK, MB: 1.2
CK, MB: 1.5
CK, MB: 1.6
Relative Index: INVALID
Relative Index: INVALID
Total CK: 58
Total CK: 80
Troponin I: 0.06
Troponin I: 0.11 — ABNORMAL HIGH

## 2011-03-30 LAB — BASIC METABOLIC PANEL
BUN: 19
Creatinine, Ser: 1.13
GFR calc non Af Amer: >60
Glucose, Bld: 127 — ABNORMAL HIGH
Potassium: 3.8

## 2011-03-30 LAB — POCT CARDIAC MARKERS
CKMB, poc: 1
Operator id: 207241
Troponin i, poc: <0.05

## 2011-03-30 LAB — PROTIME-INR
INR: 1
Prothrombin Time: 13.5

## 2011-03-30 LAB — APTT: aPTT: 31

## 2011-04-02 LAB — BASIC METABOLIC PANEL
BUN: 20
CO2: 26
CO2: 29
Calcium: 9.2
Calcium: 9.5
Chloride: 104
Chloride: 105
Chloride: 106
Creatinine, Ser: 1.09
GFR calc Af Amer: >60
GFR calc non Af Amer: >60
Glucose, Bld: 149 — ABNORMAL HIGH
Glucose, Bld: 84
Potassium: 3.6
Sodium: 139
Sodium: 139

## 2011-04-02 LAB — COMPREHENSIVE METABOLIC PANEL
Alkaline Phosphatase: 63
BUN: 17
Chloride: 110
Creatinine, Ser: 1.1
GFR calc non Af Amer: >60
Glucose, Bld: 83
Potassium: 3.7
Total Bilirubin: 0.7

## 2011-04-02 LAB — POCT CARDIAC MARKERS
Myoglobin, poc: 44.8
Myoglobin, poc: 92.3
Troponin i, poc: <0.05

## 2011-04-02 LAB — DIFFERENTIAL
Basophils Absolute: 0
Basophils Absolute: 0
Basophils Absolute: 0
Basophils Relative: 0
Basophils Relative: 0
Basophils Relative: 0
Eosinophils Absolute: 0.2
Eosinophils Absolute: 0.3
Eosinophils Absolute: 0.3
Eosinophils Relative: 3
Eosinophils Relative: 4
Lymphocytes Relative: 24
Lymphocytes Relative: 32
Lymphs Abs: 2
Monocytes Absolute: 0.5
Monocytes Absolute: 0.6
Monocytes Relative: 8
Neutro Abs: 3.2
Neutro Abs: 4.4
Neutrophils Relative %: 63
Neutrophils Relative %: 67

## 2011-04-02 LAB — CBC
HCT: 42.6
HCT: 45.2
Hemoglobin: 14.4
Hemoglobin: 14.5
Hemoglobin: 15.3
MCHC: 33.7
MCHC: 34.3
MCV: 87.4
MCV: 87.5
MCV: 87.6
MCV: 87.9
Platelets: 102 — ABNORMAL LOW
Platelets: 116 — ABNORMAL LOW
RDW: 13.5
RDW: 13.6
RDW: 13.7
WBC: 6.6

## 2011-04-02 LAB — CARDIAC PANEL(CRET KIN+CKTOT+MB+TROPI)
CK, MB: 0.7
Relative Index: INVALID
Total CK: 48
Total CK: 52
Troponin I: 0.01
Troponin I: 0.02

## 2011-04-02 LAB — ETHANOL: Alcohol, Ethyl (B): <5

## 2011-04-02 LAB — HEPATIC FUNCTION PANEL
ALT: 30
AST: 21
Indirect Bilirubin: 0.8
Total Protein: 6.5

## 2011-04-02 LAB — GLUCOSE, CAPILLARY
Glucose-Capillary: 112 — ABNORMAL HIGH
Glucose-Capillary: 185 — ABNORMAL HIGH
Glucose-Capillary: 95

## 2011-04-03 LAB — CBC
HCT: 45.9
Hemoglobin: 15.4
MCHC: 33.6
MCV: 88.4
RBC: 5.19
WBC: 9.1

## 2011-04-03 LAB — URINE MICROSCOPIC-ADD ON

## 2011-04-03 LAB — BASIC METABOLIC PANEL
BUN: 16
CO2: 28
Calcium: 9.7
Glucose, Bld: 105 — ABNORMAL HIGH
Sodium: 140

## 2011-04-03 LAB — URINALYSIS, ROUTINE W REFLEX MICROSCOPIC
Nitrite: POSITIVE — AB
Specific Gravity, Urine: >1.03 — ABNORMAL HIGH
Urobilinogen, UA: 1
pH: 5.5

## 2011-04-03 LAB — DIFFERENTIAL
Eosinophils Absolute: 0.3
Eosinophils Relative: 3
Lymphs Abs: 1.9
Monocytes Absolute: 0.7
Monocytes Relative: 8

## 2011-04-03 LAB — GLUCOSE, CAPILLARY

## 2011-06-13 ENCOUNTER — Ambulatory Visit (HOSPITAL_COMMUNITY)
Admission: RE | Admit: 2011-06-13 | Discharge: 2011-06-13 | Disposition: A | Discharge status: Home / Self Care | Payer: PRIVATE HEALTH INSURANCE | Source: Ambulatory Visit | Attending: Urology | Admitting: Urology

## 2011-06-13 ENCOUNTER — Other Ambulatory Visit: Payer: Self-pay | Admitting: Urology

## 2011-06-13 DIAGNOSIS — N200 Calculus of kidney: Secondary | ICD-10-CM

## 2011-06-13 DIAGNOSIS — R109 Unspecified abdominal pain: Secondary | ICD-10-CM | POA: Insufficient documentation

## 2011-06-15 ENCOUNTER — Ambulatory Visit (INDEPENDENT_AMBULATORY_CARE_PROVIDER_SITE_OTHER): Payer: PRIVATE HEALTH INSURANCE | Admitting: Urology

## 2011-06-15 DIAGNOSIS — N200 Calculus of kidney: Secondary | ICD-10-CM

## 2011-06-22 ENCOUNTER — Encounter: Payer: Self-pay | Admitting: Cardiology

## 2011-08-09 ENCOUNTER — Ambulatory Visit (INDEPENDENT_AMBULATORY_CARE_PROVIDER_SITE_OTHER): Payer: PRIVATE HEALTH INSURANCE | Admitting: Adult Health

## 2011-08-09 ENCOUNTER — Encounter: Payer: Self-pay | Admitting: Adult Health

## 2011-08-09 VITALS — BP 148/88 | HR 58 | Resp 16 | Ht 71.0 in | Wt 180.0 lb

## 2011-08-09 DIAGNOSIS — I2510 Atherosclerotic heart disease of native coronary artery without angina pectoris: Secondary | ICD-10-CM

## 2011-08-09 DIAGNOSIS — E785 Hyperlipidemia, unspecified: Secondary | ICD-10-CM

## 2011-08-09 NOTE — Assessment & Plan Note (Signed)
This is being checked in a couple of weeks. He is to continue his low fat diet and exercise.

## 2011-08-09 NOTE — Progress Notes (Signed)
HPI: [name] is a 61 y/o patient of Dr. Dietrich Pates we are following for ongoing assessment and treatment of CAD,(s/p cardic cath in 7/09 with stent to the RCA using a BMS, with history of stent to LAD in 1999 found to be patent, with a 50-60% prox circumflex stenosis.), AAA with most recent abdominal study Dec 2010, revealing 3.6 cm focal abdominal aortic aneurysm at the distal abdominal aorta; the IMA arises from the aneurysmal dilatation; recent admission for renal calculi removal, diabetes, and anxiety. He comes today without complaint. He is now retired for one month and is golfing more and walking 3-4 times a week. He has lost 8 lbs since being seen last. His stress level is completely manageable now. He has not been seen in the hospital or ER since last office visit. He is going to a health fair in 2 weeks to have his cholesterol checked.   Allergies  Allergen Reactions  . Meperidine Hcl   . Promethazine Hcl     Current Outpatient Prescriptions  Medication Sig Dispense Refill  . ALPRAZolam (XANAX) 0.5 MG tablet Take 0.5 mg by mouth 3 (three) times daily.        Marland Kitchen amLODipine (NORVASC) 2.5 MG tablet Take 2.5 mg by mouth daily.        Marland Kitchen aspirin 81 MG tablet Take 81 mg by mouth daily.        Marland Kitchen atorvastatin (LIPITOR) 20 MG tablet Take 20 mg by mouth at bedtime.        . fish oil-omega-3 fatty acids 1000 MG capsule Take 1 capsule by mouth 2 (two) times daily.        . Linagliptin-Metformin HCl (JENTADUETO) 2.5-500 MG TABS Take by mouth 2 (two) times daily.      . meloxicam (MOBIC) 7.5 MG tablet Take 7.5 mg by mouth 2 (two) times daily.        . metoprolol (TOPROL-XL) 100 MG 24 hr tablet Take 100 mg by mouth 2 (two) times daily.       . potassium chloride (K-DUR) 10 MEQ tablet Take 10 mEq by mouth 3 (three) times daily.      . ramipril (ALTACE) 10 MG tablet Take 10 mg by mouth daily.        . tadalafil (CIALIS) 10 MG tablet Take 10 mg by mouth daily as needed.      . zolpidem (AMBIEN) 10 MG  tablet Take 10 mg by mouth at bedtime as needed.          Past Medical History  Diagnosis Date  . ASCVD (arteriosclerotic cardiovascular disease)   . ASCVD (arteriosclerotic cardiovascular disease)   . AAA (abdominal aortic aneurysm)   . Hyperlipidemia   . Diabetes mellitus   . Thrombocytopenia   . Tobacco abuse   . Hepatic steatosis   . ED (erectile dysfunction)   . Nephrolithiasis   . Anxiety   . Depression   . Insomnia     Past Surgical History  Procedure Date  . Coronary artery bypass graft   . Lithotripsy     OZH:YQMVHQ of systems complete and found to be negative unless listed above PHYSICAL EXAM BP 148/88  Pulse 58  Resp 16  Ht 5\' 11"  (1.803 m)  Wt 180 lb (81.647 kg)  BMI 25.10 kg/m2  General: Well developed, well nourished, in no acute distress Head: Eyes PERRLA, No xanthomas.   Normal cephalic and atramatic  Lungs: Clear bilaterally to auscultation and percussion. Heart: HRRR S1 S2,  without MRG.  Pulses are 2+ & equal.            No carotid bruit. No JVD.  No abdominal bruits. No femoral bruits. Abdomen: Bowel sounds are positive, abdomen soft and non-tender without masses or                  Hernia's noted. Msk:  Back normal, normal gait. Normal strength and tone for age. Extremities: No clubbing, cyanosis or edema.  DP +1 Neuro: Alert and oriented X 3. Psych:  Good affect, responds appropriately  EKG:NSR rate of 60 bpm.  ASSESSMENT AND PLAN

## 2011-08-09 NOTE — Assessment & Plan Note (Addendum)
He is stable and without complaint today. He is more active and is less stressed since retirement one month ago. Will continue to see him biannually. Refills provided on his medications. His BP can be better controlled and I will therefore increase his norvasc to 5 mg daily from 2.5 mg. Will monitor the results.

## 2011-08-09 NOTE — Patient Instructions (Signed)
Your physician recommends that you schedule a follow-up appointment in: 6 months  Your physician has recommended you make the following change in your medication:  INCREASE Norvasc to 5 mg daily

## 2011-08-16 ENCOUNTER — Other Ambulatory Visit: Payer: Self-pay | Admitting: Cardiology

## 2011-08-16 NOTE — Telephone Encounter (Signed)
PT IS OUT OF  MEDS

## 2011-09-05 ENCOUNTER — Encounter: Payer: Self-pay | Admitting: Adult Health

## 2012-01-01 ENCOUNTER — Other Ambulatory Visit: Payer: Self-pay | Admitting: Urology

## 2012-01-01 ENCOUNTER — Ambulatory Visit (HOSPITAL_COMMUNITY)
Admission: RE | Admit: 2012-01-01 | Discharge: 2012-01-01 | Disposition: A | Discharge status: Home / Self Care | Payer: PRIVATE HEALTH INSURANCE | Source: Ambulatory Visit | Attending: Urology | Admitting: Urology

## 2012-01-01 DIAGNOSIS — N200 Calculus of kidney: Secondary | ICD-10-CM

## 2012-01-11 ENCOUNTER — Ambulatory Visit (INDEPENDENT_AMBULATORY_CARE_PROVIDER_SITE_OTHER): Payer: PRIVATE HEALTH INSURANCE | Admitting: Urology

## 2012-01-11 DIAGNOSIS — N200 Calculus of kidney: Secondary | ICD-10-CM

## 2012-01-11 DIAGNOSIS — N201 Calculus of ureter: Secondary | ICD-10-CM

## 2012-02-05 ENCOUNTER — Ambulatory Visit: Payer: PRIVATE HEALTH INSURANCE | Admitting: Adult Health

## 2012-02-11 ENCOUNTER — Ambulatory Visit: Payer: PRIVATE HEALTH INSURANCE | Admitting: Adult Health

## 2012-02-13 ENCOUNTER — Ambulatory Visit (HOSPITAL_COMMUNITY)
Admission: RE | Admit: 2012-02-13 | Discharge: 2012-02-13 | Disposition: A | Discharge status: Home / Self Care | Payer: PRIVATE HEALTH INSURANCE | Source: Ambulatory Visit | Attending: Urology | Admitting: Urology

## 2012-02-13 ENCOUNTER — Other Ambulatory Visit: Payer: Self-pay | Admitting: Urology

## 2012-02-13 DIAGNOSIS — N201 Calculus of ureter: Secondary | ICD-10-CM

## 2012-02-13 DIAGNOSIS — I998 Other disorder of circulatory system: Secondary | ICD-10-CM | POA: Insufficient documentation

## 2012-02-18 ENCOUNTER — Ambulatory Visit: Payer: PRIVATE HEALTH INSURANCE | Admitting: Adult Health

## 2012-02-26 ENCOUNTER — Ambulatory Visit (INDEPENDENT_AMBULATORY_CARE_PROVIDER_SITE_OTHER): Payer: PRIVATE HEALTH INSURANCE | Admitting: Cardiology

## 2012-02-26 ENCOUNTER — Encounter: Payer: Self-pay | Admitting: Cardiology

## 2012-02-26 VITALS — BP 128/75 | HR 67 | Ht 71.0 in | Wt 172.0 lb

## 2012-02-26 DIAGNOSIS — E785 Hyperlipidemia, unspecified: Secondary | ICD-10-CM

## 2012-02-26 DIAGNOSIS — N200 Calculus of kidney: Secondary | ICD-10-CM

## 2012-02-26 DIAGNOSIS — N529 Male erectile dysfunction, unspecified: Secondary | ICD-10-CM

## 2012-02-26 DIAGNOSIS — I714 Abdominal aortic aneurysm, without rupture: Secondary | ICD-10-CM

## 2012-02-26 DIAGNOSIS — I2510 Atherosclerotic heart disease of native coronary artery without angina pectoris: Secondary | ICD-10-CM

## 2012-02-26 DIAGNOSIS — E119 Type 2 diabetes mellitus without complications: Secondary | ICD-10-CM

## 2012-02-26 DIAGNOSIS — G4700 Insomnia, unspecified: Secondary | ICD-10-CM

## 2012-02-26 DIAGNOSIS — D696 Thrombocytopenia, unspecified: Secondary | ICD-10-CM

## 2012-02-26 MED ORDER — METOPROLOL SUCCINATE ER 50 MG PO TB24: 50.0000 mg | ORAL_TABLET | Freq: Every day | ORAL | Status: DC

## 2012-02-26 NOTE — Assessment & Plan Note (Signed)
Patient has had multiple frequent recurrent episodes of symptomatic nephrolithiasis.  He is followed by Dr. Annabell Howells for this problem.

## 2012-02-26 NOTE — Patient Instructions (Addendum)
Your physician recommends that you schedule a follow-up appointment in: 1 year  Your physician recommends that you return for lab work in: Within the week  Your physician has recommended you make the following change in your medication:  1 - DECREASE Metoprolol to 50 mg daily - Call if Blood pressure trends > 140 (top number) and > 90 (bottom number)

## 2012-02-26 NOTE — Assessment & Plan Note (Addendum)
Patient reports using hypnotics 3-4 times per week.  He does not note mental clouding in the morning.  I suggest he attempt to minimize use of these agents but to continue to take them as necessary to provide reasonable sleep.

## 2012-02-26 NOTE — Assessment & Plan Note (Addendum)
Dr. Mariel Sleet assessed thrombocytopenia in 2007.  Apparently his diagnosis was mild idiopathic thrombocytopenia purpura requiring no specific therapy.

## 2012-02-26 NOTE — Progress Notes (Signed)
Patient ID: CHRISTINE SCHIEFELBEIN, male   DOB: [birth year], 61 y.o.   MRN: [id number]  HPI: Scheduled return visit for this nice gentleman with coronary artery disease.  Since his last visit, he is retired from work with the [employer] department.  So far, he describes retirement as enjoyable and busy.  He has moderated his diet with a substantial resulting weight loss.  He exerts himself without significant difficulty, but does report a degree of fatigue and exercise intolerance.  Prior to Admission medications   Medication Sig Start Date End Date Taking? Authorizing Provider  ALPRAZolam Prudy Feeler) 0.5 MG tablet Take 0.5 mg by mouth 3 (three) times daily.     Yes Historical Provider, MD  amLODipine (NORVASC) 2.5 MG tablet Take 2.5 mg by mouth daily.     Yes Historical Provider, MD  aspirin 81 MG tablet Take 81 mg by mouth daily.     Yes Historical Provider, MD  atorvastatin (LIPITOR) 20 MG tablet TAKE 1 TABLET BY MOUTH AT BEDTIME 08/16/11  Yes Jodelle Gross, NP  fish oil-omega-3 fatty acids 1000 MG capsule Take 1 capsule by mouth 2 (two) times daily.     Yes Historical Provider, MD  Linagliptin-Metformin HCl (JENTADUETO) 2.5-500 MG TABS Take 2 tablets by mouth 2 (two) times daily.    Yes Historical Provider, MD  meloxicam (MOBIC) 7.5 MG tablet Take 7.5 mg by mouth 2 (two) times daily.     Yes Historical Provider, MD  potassium chloride (K-DUR) 10 MEQ tablet Take 10 mEq by mouth 3 (three) times daily.   Yes Historical Provider, MD  ramipril (ALTACE) 10 MG tablet Take 10 mg by mouth daily.     Yes Historical Provider, MD  tadalafil (CIALIS) 10 MG tablet Take 10 mg by mouth daily as needed.   Yes Historical Provider, MD  zolpidem (AMBIEN) 10 MG tablet Take 10 mg by mouth at bedtime as needed.     Yes Historical Provider, MD  metoprolol succinate (TOPROL-XL) 50 MG 24 hr tablet Take 1 tablet (50 mg total) by mouth daily. Take with or immediately following a meal. 02/26/12 02/25/13  Kathlen Brunswick, MD     Allergies  Allergen Reactions  . Meperidine Hcl   . Promethazine Hcl       Past medical history, social history, and family history reviewed and updated.  ROS: Denies pedal edema, orthopnea, PND, palpitations, lightheadedness or syncope.  All other systems reviewed and are negative.  PHYSICAL EXAM: BP 128/75  Pulse 67  Ht 5\' 11"  (1.803 m)  Wt 78.019 kg (172 lb)  BMI 23.99 kg/m2  General-Well developed; no acute distress Body habitus-proportionate weight and height Neck-No JVD; no carotid bruits Lungs-clear lung fields; resonant to percussion Cardiovascular-normal PMI; normal S1 and S2; grade 1-2/6 systolic ejection murmur; S4 present Abdomen-normal bowel sounds; soft and non-tender without masses or organomegaly Musculoskeletal-No deformities, no cyanosis or clubbing Neurologic-Normal cranial nerves; symmetric strength and tone Skin-Warm, no significant lesions Extremities-distal pulses intact; no edema  ASSESSMENT AND PLAN:  Volcano Bing, MD 02/26/2012 5:45 PM

## 2012-02-26 NOTE — Assessment & Plan Note (Signed)
Abdominal aortic aneurysm remains minimal and likely will not present any clinical issues for quite a few years.  Repeat imaging anticipated in approximately 2 years.

## 2012-02-26 NOTE — Progress Notes (Deleted)
Name: [name]    DOB: April 26, 1951  Age: 61 y.o.  MR#: [id number]       PCP:  Colette Ribas, MD      Insurance: @PAYORNAME @   CC:    Chief Complaint  Patient presents with  . Fatigue    States fatigued / Med list/TC    VS BP 128/75  Pulse 67  Ht 5\' 11"  (1.803 m)  Wt 172 lb (78.019 kg)  BMI 23.99 kg/m2  Weights Current Weight  02/26/12 172 lb (78.019 kg)  08/09/11 180 lb (81.647 kg)  08/09/10 187 lb (84.823 kg)    Blood Pressure  BP Readings from Last 3 Encounters:  02/26/12 128/75  08/09/11 148/88  08/09/10 144/85     Admit date:  (Not on file) Last encounter with RMR:  Visit date not found   Allergy Allergies  Allergen Reactions  . Meperidine Hcl   . Promethazine Hcl     Current Outpatient Prescriptions  Medication Sig Dispense Refill  . ALPRAZolam (XANAX) 0.5 MG tablet Take 0.5 mg by mouth 3 (three) times daily.        Marland Kitchen amLODipine (NORVASC) 2.5 MG tablet Take 2.5 mg by mouth daily.        Marland Kitchen aspirin 81 MG tablet Take 81 mg by mouth daily.        Marland Kitchen atorvastatin (LIPITOR) 20 MG tablet TAKE 1 TABLET BY MOUTH AT BEDTIME  30 tablet  6  . fish oil-omega-3 fatty acids 1000 MG capsule Take 1 capsule by mouth 2 (two) times daily.        . Linagliptin-Metformin HCl (JENTADUETO) 2.5-500 MG TABS Take 2 tablets by mouth 2 (two) times daily.       . meloxicam (MOBIC) 7.5 MG tablet Take 7.5 mg by mouth 2 (two) times daily.        . metoprolol (TOPROL-XL) 100 MG 24 hr tablet Take 100 mg by mouth 2 (two) times daily.       . potassium chloride (K-DUR) 10 MEQ tablet Take 10 mEq by mouth 3 (three) times daily.      . ramipril (ALTACE) 10 MG tablet Take 10 mg by mouth daily.        . tadalafil (CIALIS) 10 MG tablet Take 10 mg by mouth daily as needed.      . zolpidem (AMBIEN) 10 MG tablet Take 10 mg by mouth at bedtime as needed.          Discontinued Meds:   There are no discontinued medications.  Patient Active Problem List  Diagnosis  . ABDOMINAL AORTIC  ANEURYSM  . DIABETES MELLITUS, TYPE II  . HYPERLIPIDEMIA  . THROMBOCYTOPENIA  . ATHEROSCLEROTIC CARDIOVASCULAR DISEASE  . NEPHROLITHIASIS, RECURRENT  . ERECTILE DYSFUNCTION, ORGANIC  . INSOMNIA    LABS No visits with results within 3 Month(s) from this visit. Latest known visit with results is:  Admission on 12/10/2010, Discharged on 12/10/2010  Component Date Value  . Neutrophils Relative 12/10/2010 62   . Neutro Abs 12/10/2010 5.1   . Lymphocytes Relative 12/10/2010 22   . Lymphs Abs 12/10/2010 1.8   . Monocytes Relative 12/10/2010 7   . Monocytes Absolute 12/10/2010 0.6   . Eosinophils Relative 12/10/2010 8*  . Eosinophils Absolute 12/10/2010 0.6   . Basophils Relative 12/10/2010 1   . Basophils Absolute 12/10/2010 0.0   . WBC 12/10/2010 8.1   . RBC 12/10/2010 4.80   . Hemoglobin 12/10/2010 14.3   . HCT 12/10/2010 43.6   .  MCV 12/10/2010 90.8   . [facility] 12/10/2010 29.8   . MCHC 12/10/2010 32.8   . RDW 12/10/2010 13.5   . Platelets 12/10/2010 114*  . Color, Urine 12/10/2010 AMBER*  . APPearance 12/10/2010 CLOUDY*  . Specific Gravity, Urine 12/10/2010 >1.030*  . pH 12/10/2010 6.5   . Glucose, UA 12/10/2010 NEGATIVE   . Hgb urine dipstick 12/10/2010 LARGE*  . Bilirubin Urine 12/10/2010 SMALL*  . Ketones, ur 12/10/2010 TRACE*  . Protein, ur 12/10/2010 >300*  . Urobilinogen, UA 12/10/2010 1.0   . Nitrite 12/10/2010 POSITIVE*  . Leukocytes, UA 12/10/2010 TRACE*  . WBC, UA 12/10/2010 21-50   . RBC / HPF 12/10/2010 TOO NUMEROUS TO COUNT   . Bacteria, UA 12/10/2010 MANY*  . Sodium 12/10/2010 141   . Potassium 12/10/2010 4.4   . Chloride 12/10/2010 104   . CO2 12/10/2010 29   . Glucose, Bld 12/10/2010 162*  . BUN 12/10/2010 24*  . Creatinine, Ser 12/10/2010 1.25   . Calcium 12/10/2010 10.6*  . GFR calc non Af Amer 12/10/2010 60*  . GFR calc Af Amer 12/10/2010 >60   . Specimen Description 12/10/2010 URINE, CLEAN CATCH   . Special Requests 12/10/2010 NONE   . Culture   Setup Time 12/10/2010 [id number]   . Colony Count 12/10/2010 NO GROWTH   . Culture 12/10/2010 NO GROWTH   . Report Status 12/10/2010 12/12/2010 FINAL      Results for this Opt Visit:     Results for orders placed during the hospital encounter of 12/10/10  DIFFERENTIAL      Component Value Range   Neutrophils Relative 62  43 - 77 %   Neutro Abs 5.1  1.7 - 7.7 K/uL   Lymphocytes Relative 22  12 - 46 %   Lymphs Abs 1.8  0.7 - 4.0 K/uL   Monocytes Relative 7  3 - 12 %   Monocytes Absolute 0.6  0.1 - 1.0 K/uL   Eosinophils Relative 8 (*) 0 - 5 %   Eosinophils Absolute 0.6  0.0 - 0.7 K/uL   Basophils Relative 1  0 - 1 %   Basophils Absolute 0.0  0.0 - 0.1 K/uL  CBC      Component Value Range   WBC 8.1  4.0 - 10.5 K/uL   RBC 4.80  4.22 - 5.81 MIL/uL   Hemoglobin 14.3  13.0 - 17.0 g/dL   HCT 81.1  91.4 - 78.2 %   MCV 90.8  78.0 - 100.0 fL   MCH 29.8  26.0 - 34.0 pg   MCHC 32.8  30.0 - 36.0 g/dL   RDW 95.6  21.3 - 08.6 %   Platelets 114 (*) 150 - 400 K/uL  URINALYSIS, ROUTINE W REFLEX MICROSCOPIC      Component Value Range   Color, Urine AMBER (*) YELLOW   APPearance CLOUDY (*) CLEAR   Specific Gravity, Urine >1.030 (*) 1.005 - 1.030   pH 6.5  5.0 - 8.0   Glucose, UA NEGATIVE  NEGATIVE mg/dL   Hgb urine dipstick LARGE (*) NEGATIVE   Bilirubin Urine SMALL (*) NEGATIVE   Ketones, ur TRACE (*) NEGATIVE mg/dL   Protein, ur >578 (*) NEGATIVE mg/dL   Urobilinogen, UA 1.0  0.0 - 1.0 mg/dL   Nitrite POSITIVE (*) NEGATIVE   Leukocytes, UA TRACE (*) NEGATIVE  URINE MICROSCOPIC-ADD ON      Component Value Range   WBC, UA 21-50  <3 WBC/hpf   RBC / HPF TOO NUMEROUS TO COUNT  <  3 RBC/hpf   Bacteria, UA MANY (*) RARE  BASIC METABOLIC PANEL      Component Value Range   Sodium 141  135 - 145 mEq/L   Potassium 4.4  3.5 - 5.1 mEq/L   Chloride 104  96 - 112 mEq/L   CO2 29  19 - 32 mEq/L   Glucose, Bld 162 (*) 70 - 99 mg/dL   BUN 24 (*) 6 - 23 mg/dL   Creatinine, Ser 6.21  0.4 - 1.5  mg/dL   Calcium 30.8 (*) 8.4 - 10.5 mg/dL   GFR calc non Af Amer 60 (*) >60 mL/min   GFR calc Af Amer >60  >60 mL/min  URINE CULTURE      Component Value Range   Specimen Description URINE, CLEAN CATCH     Special Requests NONE     Culture  Setup Time [id number]     Colony Count NO GROWTH     Culture NO GROWTH     Report Status 12/12/2010 FINAL      EKG Orders placed in visit on 08/09/11  . EKG 12-LEAD     Prior Assessment and Plan Problem List as of 02/26/2012            Cardiology Problems   ABDOMINAL AORTIC ANEURYSM   HYPERLIPIDEMIA   Last Assessment & Plan Note   08/09/2011 Office Visit Signed 08/09/2011  4:34 PM by Jodelle Gross, NP    This is being checked in a couple of weeks. He is to continue his low fat diet and exercise.    ATHEROSCLEROTIC CARDIOVASCULAR DISEASE   Last Assessment & Plan Note   08/09/2011 Office Visit Addendum 08/09/2011  4:33 PM by Jodelle Gross, NP    He is stable and without complaint today. He is more active and is less stressed since retirement one month ago. Will continue to see him biannually. Refills provided on his medications. His BP can be better controlled and I will therefore increase his norvasc to 5 mg daily from 2.5 mg. Will monitor the results.      Other   DIABETES MELLITUS, TYPE II   THROMBOCYTOPENIA   NEPHROLITHIASIS, RECURRENT   ERECTILE DYSFUNCTION, ORGANIC   INSOMNIA       Imaging: Dg Abd 1 View  02/13/2012  *RADIOLOGY REPORT*  Clinical Data: Ureteral calculus  ABDOMEN - 1 VIEW  Comparison: 01/01/2012  Findings: There is nonspecific nonobstructive bowel gas pattern. Again noted small calcified calculi in the left renal region the largest measures 4.7 mm.  A probable bone island left iliac bone is stable. Left pelvic phleboliths are stable.  No definite ureteral calculi are identified.  Ovoid density is noted adjacent to lower endplate L3 vertebral body on the left side measures about 1.8 cm. This is less likely to  represent a ureteral calculus and was not present on the prior exam.  IMPRESSION:  Again noted small calcified calculi in the left renal region the largest measures 4.7 mm.  A probable bone island left iliac bone is stable. Left pelvic phleboliths are stable.  No definite ureteral calculi are identified.  Ovoid density is noted adjacent to lower endplate L3 vertebral body on the left side measures about 1.8 cm. This is less likely to represent a ureteral calculus and was not present on the prior exam.  Original Report Authenticated By: Natasha Mead, M.D.     [facility] Calculation: Score not calculated. Missing: Total Cholesterol

## 2012-02-27 ENCOUNTER — Encounter: Payer: Self-pay | Admitting: Cardiology

## 2012-02-27 LAB — CBC
MCV: 86.1 fL (ref 78.0–100.0)
Platelets: 120 10*3/uL — ABNORMAL LOW (ref 150–400)
RDW: 14.1 % (ref 11.5–15.5)
WBC: 9 10*3/uL (ref 4.0–10.5)

## 2012-02-27 LAB — LIPID PANEL
HDL: 34 mg/dL — ABNORMAL LOW (ref >39)
LDL Cholesterol: 43 mg/dL (ref 0–99)
Total CHOL/HDL Ratio: 2.7 Ratio
VLDL: 16 mg/dL (ref 0–40)

## 2012-02-27 NOTE — Assessment & Plan Note (Signed)
Patient is doing well symptomatically with respect to coronary disease.  He reports nonspecific symptoms, which could reflect an adverse effect of beta blocker.  The dose of metoprolol will be decreased by 50%.  He will call if control of hypertension is lost or if fatigue and exercise intolerance persists.

## 2012-02-27 NOTE — Assessment & Plan Note (Signed)
Control of hyperlipidemia was adequate when last assessed in 2012; repeat profile will be obtained.

## 2012-03-02 ENCOUNTER — Encounter: Payer: Self-pay | Admitting: Cardiology

## 2012-03-04 ENCOUNTER — Encounter: Payer: Self-pay | Admitting: *Deleted

## 2012-03-20 ENCOUNTER — Other Ambulatory Visit: Payer: Self-pay | Admitting: Adult Health

## 2012-03-25 ENCOUNTER — Emergency Department (HOSPITAL_COMMUNITY)
Admission: EM | Admit: 2012-03-25 | Discharge: 2012-03-25 | Disposition: A | Discharge status: Home / Self Care | Payer: PRIVATE HEALTH INSURANCE | Attending: Emergency Medicine | Admitting: Emergency Medicine

## 2012-03-25 ENCOUNTER — Emergency Department (HOSPITAL_COMMUNITY): Payer: PRIVATE HEALTH INSURANCE

## 2012-03-25 ENCOUNTER — Encounter (HOSPITAL_COMMUNITY): Payer: Self-pay | Admitting: Emergency Medicine

## 2012-03-25 DIAGNOSIS — K3189 Other diseases of stomach and duodenum: Secondary | ICD-10-CM

## 2012-03-25 DIAGNOSIS — I714 Abdominal aortic aneurysm, without rupture, unspecified: Secondary | ICD-10-CM | POA: Insufficient documentation

## 2012-03-25 DIAGNOSIS — N200 Calculus of kidney: Secondary | ICD-10-CM | POA: Insufficient documentation

## 2012-03-25 DIAGNOSIS — I2510 Atherosclerotic heart disease of native coronary artery without angina pectoris: Secondary | ICD-10-CM | POA: Insufficient documentation

## 2012-03-25 DIAGNOSIS — N210 Calculus in bladder: Secondary | ICD-10-CM

## 2012-03-25 DIAGNOSIS — R109 Unspecified abdominal pain: Secondary | ICD-10-CM | POA: Insufficient documentation

## 2012-03-25 DIAGNOSIS — Z79899 Other long term (current) drug therapy: Secondary | ICD-10-CM | POA: Insufficient documentation

## 2012-03-25 DIAGNOSIS — K319 Disease of stomach and duodenum, unspecified: Secondary | ICD-10-CM | POA: Insufficient documentation

## 2012-03-25 DIAGNOSIS — E119 Type 2 diabetes mellitus without complications: Secondary | ICD-10-CM | POA: Insufficient documentation

## 2012-03-25 LAB — CBC WITH DIFFERENTIAL/PLATELET
Basophils Absolute: 0 10*3/uL (ref 0.0–0.1)
Basophils Relative: 0 % (ref 0–1)
Hemoglobin: 16.1 g/dL (ref 13.0–17.0)
MCHC: 34.8 g/dL (ref 30.0–36.0)
Monocytes Relative: 4 % (ref 3–12)
Neutro Abs: 11.1 10*3/uL — ABNORMAL HIGH (ref 1.7–7.7)
Neutrophils Relative %: 83 % — ABNORMAL HIGH (ref 43–77)
RDW: 13.4 % (ref 11.5–15.5)
WBC: 13.4 10*3/uL — ABNORMAL HIGH (ref 4.0–10.5)

## 2012-03-25 LAB — BASIC METABOLIC PANEL
Chloride: 101 mEq/L (ref 96–112)
GFR calc Af Amer: >90 mL/min (ref >90)
Potassium: 4.3 mEq/L (ref 3.5–5.1)

## 2012-03-25 LAB — HEPATIC FUNCTION PANEL
Albumin: 4.5 g/dL (ref 3.5–5.2)
Alkaline Phosphatase: 81 U/L (ref 39–117)
Indirect Bilirubin: 0.5 mg/dL (ref 0.3–0.9)
Total Bilirubin: 0.6 mg/dL (ref 0.3–1.2)

## 2012-03-25 LAB — URINALYSIS, ROUTINE W REFLEX MICROSCOPIC
Leukocytes, UA: NEGATIVE
Nitrite: NEGATIVE
Specific Gravity, Urine: 1.01 (ref 1.005–1.030)
pH: 7 (ref 5.0–8.0)

## 2012-03-25 LAB — LIPASE, BLOOD: Lipase: 37 U/L (ref 11–59)

## 2012-03-25 MED ORDER — MORPHINE SULFATE 4 MG/ML IJ SOLN
4.0000 mg | Freq: Once | INTRAMUSCULAR | Status: AC
  Administered 2012-03-25: 4 mg via INTRAVENOUS
  Filled 2012-03-25: qty 1

## 2012-03-25 MED ORDER — TRAMADOL-ACETAMINOPHEN 37.5-325 MG PO TABS: ORAL_TABLET | ORAL | Status: DC

## 2012-03-25 MED ORDER — SODIUM CHLORIDE 0.9 % IV SOLN
1000.0000 mL | Freq: Once | INTRAVENOUS | Status: AC
  Administered 2012-03-25: 1000 mL via INTRAVENOUS

## 2012-03-25 MED ORDER — SODIUM CHLORIDE 0.9 % IV SOLN
20.0000 mL | INTRAVENOUS | Status: DC
  Administered 2012-03-25: 20 mL via INTRAVENOUS

## 2012-03-25 MED ORDER — ONDANSETRON HCL 4 MG PO TABS: 4.0000 mg | ORAL_TABLET | Freq: Four times a day (QID) | ORAL | Status: DC

## 2012-03-25 MED ORDER — METOCLOPRAMIDE HCL 5 MG/ML IJ SOLN
10.0000 mg | Freq: Once | INTRAMUSCULAR | Status: AC
  Administered 2012-03-25: 10 mg via INTRAVENOUS
  Filled 2012-03-25: qty 2

## 2012-03-25 MED ORDER — DIPHENHYDRAMINE HCL 50 MG/ML IJ SOLN
50.0000 mg | Freq: Once | INTRAMUSCULAR | Status: AC
  Administered 2012-03-25: 50 mg via INTRAVENOUS
  Filled 2012-03-25: qty 1

## 2012-03-25 MED ORDER — IOHEXOL 300 MG/ML  SOLN
100.0000 mL | Freq: Once | INTRAMUSCULAR | Status: AC | PRN
  Administered 2012-03-25: 100 mL via INTRAVENOUS

## 2012-03-25 MED ORDER — SODIUM CHLORIDE 0.9 % IV SOLN
1000.0000 mL | INTRAVENOUS | Status: DC
  Administered 2012-03-25: 1000 mL via INTRAVENOUS

## 2012-03-25 MED ORDER — PANTOPRAZOLE SODIUM 20 MG PO TBEC: DELAYED_RELEASE_TABLET | ORAL | Status: DC

## 2012-03-25 NOTE — ED Provider Notes (Cosign Needed)
History   This chart was scribed for Ward Givens, MD by Gerlean Ren. This patient was seen in room [facility]04/[facility]04 and the patient's care was started at 1:53PM.   CSN: [id number]  Arrival date & time 03/25/12  1238   First MD Initiated Contact with Patient 03/25/12 1257      Chief Complaint  Patient presents with  . Abdominal Pain    (Consider location/radiation/quality/duration/timing/severity/associated sxs/prior treatment) Patient is a 61 y.o. male presenting with abdominal pain. The history is provided by the patient. No language interpreter was used.  Abdominal Pain The primary symptoms of the illness include abdominal pain.   [name] is a 61 y.o. male who presents to the Emergency Department complaining of waxing-and-waning, cramping, non-radiating LUQ and LLQ pain that is described as moderate to severe (7/10 now up to 9.5/10 earlier) that pt noticed 9 hours ago shortly after waking up.  Pain is not worsened or improved by any particular activities or positions, although he states holding pressure over the area makes it feel better.   Pt reports nausea and 2 episodes of non-bloody,bilious emesis since pain onset.  Pt has a h/o kidney stones, but states that current symptoms are quite different from those with kidney stones.  Pt denies diarrhea, fever, back pain, and groin pain, however he did have diarrhea a few days ago.  He denies having this pain before.  Pt reports eating hot dog and tater tots for dinner last night.  Pt also ate ice cream that he claims may have been in the freezer for an extended period of time.  Pt has a h/o DM, anxiety, and depression.  Pt is a current everyday smoker (0.5pack/day) but denies alcohol use.     PCP is Dr. Sherwood Gambler. Dr. Dietrich Pates is Cardiologist.    Past Medical History  Diagnosis Date  . ASCVD (arteriosclerotic cardiovascular disease)     LAD stent in 1999; RCA bare-metal stent in 12/2007, LAD stent patent, 50-60% proximal Cx lesion treated  medically  . AAA (abdominal aortic aneurysm)     3.3 cm in 2012  . Hyperlipidemia   . Diabetes mellitus   . Thrombocytopenia     Chronic; evaluated by Dr. Mariel Sleet in 2007  . Tobacco abuse   . Hepatic steatosis   . ED (erectile dysfunction)   . Nephrolithiasis   . Anxiety   . Depression   . Insomnia     Past Surgical History  Procedure Date  . Coronary artery bypass graft   . Lithotripsy   . Cardiac surgery   . Inguinal hernia repair     No family history on file.  History  Substance Use Topics  . Smoking status: Current Every Day Smoker  . Smokeless tobacco: Not on file  . Alcohol Use: No   retired Patent examiner   Review of Systems  Gastrointestinal: Positive for abdominal pain.  All other systems reviewed and are negative.    Allergies  Meperidine hcl and Promethazine hcl  Home Medications   Current Outpatient Rx  Name Route Sig Dispense Refill  . ALPRAZOLAM 0.5 MG PO TABS Oral Take 0.5 mg by mouth 3 (three) times daily.      Marland Kitchen AMLODIPINE BESYLATE 2.5 MG PO TABS Oral Take 2.5 mg by mouth daily.      . ASPIRIN 81 MG PO TABS Oral Take 81 mg by mouth daily.      . ATORVASTATIN CALCIUM 20 MG PO TABS  TAKE 1 TABLET BY MOUTH  AT BEDTIME 30 tablet 6  . OMEGA-3 FATTY ACIDS 1000 MG PO CAPS Oral Take 1 capsule by mouth 2 (two) times daily.      Marland Kitchen LINAGLIPTIN-METFORMIN HCL 2.5-500 MG PO TABS Oral Take 2 tablets by mouth 2 (two) times daily.     . MELOXICAM 7.5 MG PO TABS Oral Take 7.5 mg by mouth 2 (two) times daily.      Marland Kitchen POTASSIUM CHLORIDE ER 10 MEQ PO TBCR Oral Take 10 mEq by mouth 3 (three) times daily.    Marland Kitchen RAMIPRIL 10 MG PO TABS Oral Take 10 mg by mouth daily.      Marland Kitchen TADALAFIL 10 MG PO TABS Oral Take 10 mg by mouth daily as needed.    Marland Kitchen ZOLPIDEM TARTRATE 10 MG PO TABS Oral Take 10 mg by mouth at bedtime as needed.        BP 152/85  Pulse 89  Temp 97.8 F (36.6 C) (Oral)  Resp 19  Ht 5\' 11"  (1.803 m)  Wt 172 lb (78.019 kg)  BMI 23.99 kg/m2  SpO2  96%  Laboratory interpretation all normal except hypertension  Physical Exam  Nursing note and vitals reviewed. Constitutional: He is oriented to person, place, and time. He appears well-developed and well-nourished.  Non-toxic appearance. He does not appear ill. No distress.  HENT:  Head: Normocephalic and atraumatic.  Right Ear: External ear normal.  Left Ear: External ear normal.  Nose: Nose normal. No mucosal edema or rhinorrhea.  Mouth/Throat: Mucous membranes are normal. No dental abscesses or uvula swelling.       Dry tongue.  Eyes: Conjunctivae normal and EOM are normal. Pupils are equal, round, and reactive to light.  Neck: Normal range of motion and full passive range of motion without pain. Neck supple.  Cardiovascular: Normal rate, regular rhythm and normal heart sounds.  Exam reveals no gallop and no friction rub.   No murmur heard. Pulmonary/Chest: Effort normal and breath sounds normal. No respiratory distress. He has no wheezes. He has no rhonchi. He has no rales. He exhibits no tenderness and no crepitus.  Abdominal: Soft. Normal appearance and bowel sounds are normal. He exhibits no distension. There is no tenderness. There is no rebound and no guarding.       Non-tender to palpation but he indicates entire left abdomen as location of pain.    Musculoskeletal: Normal range of motion. He exhibits no edema and no tenderness.       Moves all extremities well.   Neurological: He is alert and oriented to person, place, and time. He has normal strength. No cranial nerve deficit.  Skin: Skin is warm, dry and intact. No rash noted. No erythema. No pallor.  Psychiatric: He has a normal mood and affect. His speech is normal and behavior is normal. His mood appears not anxious.    ED Course  Procedures (including critical care time)   Medications  0.9 %  sodium chloride infusion (20 mL Intravenous New Bag/Given 03/25/12 1337)  0.9 %  sodium chloride infusion (1000 mL  Intravenous New Bag/Given 03/25/12 1411)    Followed by  0.9 %  sodium chloride infusion (not administered)  morphine 4 MG/ML injection 4 mg (not administered)  metoCLOPramide (REGLAN) injection 10 mg (10 mg Intravenous Given 03/25/12 1412)  diphenhydrAMINE (BENADRYL) injection 50 mg (50 mg Intravenous Given 03/25/12 1411)  morphine 4 MG/ML injection 4 mg (4 mg Intravenous Given 03/25/12 1412)    DIAGNOSTIC STUDIES: Oxygen Saturation is 96% on  room air, adequate by my interpretation.    COORDINATION OF CARE: 2:15PM- Ordered reglan, benadryl, morphine, IV fluids, and abdominal CT.  We discussed all his CT results including bladder stones, renal stones, AAA and thickened stomach wall. Have discussed need to have endoscopy, he does not have a GI physician will give name of on call doctor.    Results for orders placed during the hospital encounter of 03/25/12  CBC WITH DIFFERENTIAL      Component Value Range   WBC 13.4 (*) 4.0 - 10.5 K/uL   RBC 5.34  4.22 - 5.81 MIL/uL   Hemoglobin 16.1  13.0 - 17.0 g/dL   HCT 52.8  41.3 - 24.4 %   MCV 86.7  78.0 - 100.0 fL   MCH 30.1  26.0 - 34.0 pg   MCHC 34.8  30.0 - 36.0 g/dL   RDW 01.0  27.2 - 53.6 %   Platelets 119 (*) 150 - 400 K/uL   Neutrophils Relative 83 (*) 43 - 77 %   Neutro Abs 11.1 (*) 1.7 - 7.7 K/uL   Lymphocytes Relative 11 (*) 12 - 46 %   Lymphs Abs 1.4  0.7 - 4.0 K/uL   Monocytes Relative 4  3 - 12 %   Monocytes Absolute 0.6  0.1 - 1.0 K/uL   Eosinophils Relative 2  0 - 5 %   Eosinophils Absolute 0.3  0.0 - 0.7 K/uL   Basophils Relative 0  0 - 1 %   Basophils Absolute 0.0  0.0 - 0.1 K/uL  BASIC METABOLIC PANEL      Component Value Range   Sodium 138  135 - 145 mEq/L   Potassium 4.3  3.5 - 5.1 mEq/L   Chloride 101  96 - 112 mEq/L   CO2 23  19 - 32 mEq/L   Glucose, Bld 146 (*) 70 - 99 mg/dL   BUN 18  6 - 23 mg/dL   Creatinine, Ser 6.44  0.50 - 1.35 mg/dL   Calcium 03.4  8.4 - 74.2 mg/dL   GFR calc non Af Amer 81 (*) >90  mL/min   GFR calc Af Amer >90  >90 mL/min  URINALYSIS, ROUTINE W REFLEX MICROSCOPIC      Component Value Range   Color, Urine YELLOW  YELLOW   APPearance CLEAR  CLEAR   Specific Gravity, Urine 1.010  1.005 - 1.030   pH 7.0  5.0 - 8.0   Glucose, UA NEGATIVE  NEGATIVE mg/dL   Hgb urine dipstick NEGATIVE  NEGATIVE   Bilirubin Urine NEGATIVE  NEGATIVE   Ketones, ur TRACE (*) NEGATIVE mg/dL   Protein, ur NEGATIVE  NEGATIVE mg/dL   Urobilinogen, UA 0.2  0.0 - 1.0 mg/dL   Nitrite NEGATIVE  NEGATIVE   Leukocytes, UA NEGATIVE  NEGATIVE  HEPATIC FUNCTION PANEL      Component Value Range   Total Protein 7.8  6.0 - 8.3 g/dL   Albumin 4.5  3.5 - 5.2 g/dL   AST 23  0 - 37 U/L   ALT 40  0 - 53 U/L   Alkaline Phosphatase 81  39 - 117 U/L   Total Bilirubin 0.6  0.3 - 1.2 mg/dL   Bilirubin, Direct 0.1  0.0 - 0.3 mg/dL   Indirect Bilirubin 0.5  0.3 - 0.9 mg/dL  LIPASE, BLOOD      Component Value Range   Lipase 37  11 - 59 U/L   Laboratory interpretation all normal except leukocytosis   Ct  Abdomen Pelvis W Contrast  03/25/2012  *RADIOLOGY REPORT*  Clinical Data: Pain mostly on left side, vomiting, history of kidney stones, question colitis; history abdominal aortic aneurysm, diabetes, smoking  CT ABDOMEN AND PELVIS WITH CONTRAST  Technique:  Multidetector CT imaging of the abdomen and pelvis was performed following the standard protocol during bolus administration of intravenous contrast. Sagittal and coronal MPR images reconstructed from axial data set.  Contrast: OMNIPAQUE IOHEXOL 300 MG/ML  SOLN Dilute oral contrast.  Comparison: 12/10/2010  Findings: Dependent atelectasis at lung bases. Bilateral nonobstructing renal calculi and renal cysts. Calcified granuloma within right lobe liver. Liver, spleen, pancreas, and adrenal glands normal appearance. Tiny high attenuation nodule posterior right kidney 8 mm diameter image 36 unchanged. Abdominal aortic aneurysm, 3.6 x 3.6 cm, grossly unchanged.  Scattered atherosclerotic calcifications aorta and iliac arteries. Normal appendix.  Dependent calculi within urinary bladder. Minimal bladder wall thickening could be related to chronic outlet obstruction, with mild enlargement of prostate gland noted. Fat distends the left inguinal canal question tiny hernia. No distant ureteral calculi identified. Bowel loops grossly normal appearance. Wall thickening of gastric antrum. No mass, adenopathy, free fluid or inflammatory process. Large bone island left ilium. No acute osseous findings.  IMPRESSION: Stable fusiform abdominal aortic aneurysm 3.6 x 3.6 cm. Wall thickening of the gastric antrum could be related to ulcer disease or gastric neoplasm; recommend endoscopic evaluation. Bilateral nonobstructing renal calculi and cysts. Two dependent calculi within urinary bladder, cannot exclude passed ureteral calculi; recommend correlation with urinalysis. Potential tiny left inguinal hernia.   Original Report Authenticated By: Lollie Marrow, M.D.      Date: 03/25/2012  Rate: 78  Rhythm: normal sinus rhythm  QRS Axis: normal  Intervals: normal  ST/T Wave abnormalities: normal  Conduction Disutrbances:none  Narrative Interpretation: rare PVC  Old EKG Reviewed: unchanged from 5/5 2011    1. Abdominal pain   2. Bladder stones   3. Gastric wall thickening     New Prescriptions   ONDANSETRON (ZOFRAN) 4 MG TABLET    Take 1 tablet (4 mg total) by mouth every 6 (six) hours.   PANTOPRAZOLE (PROTONIX) 20 MG TABLET    Take twice a day for 2 weeks then once a day   TRAMADOL-ACETAMINOPHEN (ULTRACET) 37.5-325 MG PER TABLET    2 tabs po QID prn pain    Plan discharge  Devoria Albe, MD, FACEP   MDM   I personally performed the services described in this documentation, which was scribed in my presence. The recorded information has been reviewed and considered.  Devoria Albe, MD, FACEP         Ward Givens, MD 03/25/12 [id number]

## 2012-03-25 NOTE — ED Notes (Signed)
Pt is aware of a urine sample, pt states he will let staff know when he can void.

## 2012-03-25 NOTE — ED Notes (Signed)
Patient with no complaints at this time. Respirations even and unlabored. Skin warm/dry. Discharge instructions reviewed with patient at this time. Patient given opportunity to voice concerns/ask questions. IV removed per policy and band-aid applied to site. Patient discharged at this time and left Emergency Department with family and with steady gait.

## 2012-03-25 NOTE — ED Notes (Signed)
Patient c/o increased pain. Dr Lynelle Doctor aware and verbal order for 4 mg morphine obtained.

## 2012-03-25 NOTE — ED Notes (Signed)
Patient with c/o diffuse abdominal pain since 0530 today, describes pain as sharp. +n/v. Denies diarrhea.

## 2012-03-31 ENCOUNTER — Other Ambulatory Visit (INDEPENDENT_AMBULATORY_CARE_PROVIDER_SITE_OTHER): Payer: Self-pay | Admitting: *Deleted

## 2012-03-31 ENCOUNTER — Encounter (INDEPENDENT_AMBULATORY_CARE_PROVIDER_SITE_OTHER): Payer: Self-pay | Admitting: Internal Medicine

## 2012-03-31 ENCOUNTER — Ambulatory Visit (INDEPENDENT_AMBULATORY_CARE_PROVIDER_SITE_OTHER): Payer: No Typology Code available for payment source | Admitting: Internal Medicine

## 2012-03-31 ENCOUNTER — Encounter (INDEPENDENT_AMBULATORY_CARE_PROVIDER_SITE_OTHER): Payer: Self-pay | Admitting: *Deleted

## 2012-03-31 VITALS — BP 124/60 | HR 64 | Temp 98.7°F | Ht 71.0 in | Wt 167.0 lb

## 2012-03-31 DIAGNOSIS — R935 Abnormal findings on diagnostic imaging of other abdominal regions, including retroperitoneum: Secondary | ICD-10-CM

## 2012-03-31 DIAGNOSIS — R933 Abnormal findings on diagnostic imaging of other parts of digestive tract: Secondary | ICD-10-CM

## 2012-03-31 NOTE — Progress Notes (Signed)
Subjective:     Patient ID: [name], male   DOB: 1951/02/14, 61 y.o.   MRN: [id number]  HPIHere today for f/u after recent visit to the ED. He was having nausea, cramps, vomiting. He tried to lie down, but he still had nausea and severe pain across his upper abdomen. The pain lasted for 24 hrs and then resolved. In the ED he underwent a CT abdomen/pelvis. He was started on Protonix daily. Appetite is pretty good but he is afraid to eat. He has lost 20 pounds over the past year which was unintentional. He has cut back on the amt of food he eats. The morning his symptoms had resolved. Sometimes he feels pressure against his left upper rib cage. CT/ abdomen/pelvis with cm: IMPRESSION:  Stable fusiform abdominal aortic aneurysm 3.6 x 3.6 cm.  Wall thickening of the gastric antrum could be related to ulcer  disease or gastric neoplasm; recommend endoscopic evaluation.  Bilateral nonobstructing renal calculi and cysts.  Two dependent calculi within urinary bladder, cannot exclude passed  ureteral calculi; recommend correlation with urinalysis.  Potential tiny left inguinal hernia.   CBC    Component Value Date/Time   WBC 13.4* 03/25/2012 1308   RBC 5.34 03/25/2012 1308   HGB 16.1 03/25/2012 1308   HCT 46.3 03/25/2012 1308   PLT 119* 03/25/2012 1308   MCV 86.7 03/25/2012 1308   MCH 30.1 03/25/2012 1308   MCHC 34.8 03/25/2012 1308   RDW 13.4 03/25/2012 1308   LYMPHSABS 1.4 03/25/2012 1308   MONOABS 0.6 03/25/2012 1308   EOSABS 0.3 03/25/2012 1308   BASOSABS 0.0 03/25/2012 1308          Review of Systems see hpi Current Outpatient Prescriptions  Medication Sig Dispense Refill  . ALPRAZolam (XANAX) 0.5 MG tablet Take 0.5 mg by mouth at bedtime as needed. Sleep      . amLODipine (NORVASC) 2.5 MG tablet Take 2.5 mg by mouth daily.        Marland Kitchen aspirin 81 MG tablet Take 81 mg by mouth daily.        Marland Kitchen atorvastatin (LIPITOR) 20 MG tablet Take 20 mg by mouth daily.      . fish oil-omega-3  fatty acids 1000 MG capsule Take 1 capsule by mouth 2 (two) times daily.        . Linagliptin-Metformin HCl (JENTADUETO) 2.5-500 MG TABS Take 2 tablets by mouth 2 (two) times daily.       . meloxicam (MOBIC) 7.5 MG tablet Take 7.5 mg by mouth 2 (two) times daily.        . metoprolol succinate (TOPROL-XL) 100 MG 24 hr tablet Take 100 mg by mouth 2 (two) times daily. Take with or immediately following a meal.      . pantoprazole (PROTONIX) 20 MG tablet Take twice a day for 2 weeks then once a day  60 tablet  0  . Potassium Citrate (UROCIT-K 15) 15 MEQ (1620 MG) TBCR Take 1 tablet by mouth 2 (two) times daily.      . ramipril (ALTACE) 10 MG tablet Take 10 mg by mouth daily.        . tadalafil (CIALIS) 10 MG tablet Take 10 mg by mouth daily as needed.      . zolpidem (AMBIEN) 10 MG tablet Take 10 mg by mouth at bedtime as needed.         Past Medical History  Diagnosis Date  . ASCVD (arteriosclerotic cardiovascular disease)  LAD stent in 1999; RCA bare-metal stent in 12/2007, LAD stent patent, 50-60% proximal Cx lesion treated medically  . AAA (abdominal aortic aneurysm)     3.3 cm in 2012  . Hyperlipidemia   . Diabetes mellitus   . Thrombocytopenia     Chronic; evaluated by Dr. Mariel Sleet in 2007  . Tobacco abuse   . Hepatic steatosis   . ED (erectile dysfunction)   . Nephrolithiasis   . Anxiety   . Depression   . Insomnia    History   Social History  . Marital Status: Married    Spouse Name: N/A    Number of Children: N/A  . Years of Education: N/A   Occupational History  . Not on file.   Social History Main Topics  . Smoking status: Current Every Day Smoker  . Smokeless tobacco: Not on file   Comment: 1/2 pack a day  . Alcohol Use: No  . Drug Use: No  . Sexually Active: Not on file   Other Topics Concern  . Not on file   Social History Narrative   No regular exercise   Family Status  Relation Status Death Age  . Mother Alive     DM2  . Father Deceased      lung disease  . Sister Deceased     One deceased from ovarian cancer. 5 in pretty good health  . Brother Alive     pretty good health. Some have DM2   Allergies  Allergen Reactions  . Meperidine Hcl Other (See Comments)    Was mixed with phenergan and turned blood vessels red.   . Promethazine Hcl Other (See Comments)    Was mixed with the Demerol and caused veins to turn red.         Objective:   Physical Exam Filed Vitals:   03/31/12 1411  BP: 124/60  Pulse: 64  Temp: 98.7 F (37.1 C)  Height: 5\' 11"  (1.803 m)  Weight: 167 lb (75.751 kg)  Alert and oriented. Skin warm and dry. Oral mucosa is moist.   . Sclera anicteric, conjunctivae is pink. Thyroid not enlarged. No cervical lymphadenopathy. Lungs clear. Heart regular rate and rhythm.  Abdomen is soft. Bowel sounds are positive. No hepatomegaly. No abdominal masses felt. No tenderness.  No edema to lower extremities.        Assessment:    Abnormal CT which show wall thickening of the gastric antrum.    Plan:    EGD.

## 2012-03-31 NOTE — Patient Instructions (Addendum)
EGD. The risks and benefits such as perforation, bleeding, and infection were reviewed with the patient and is agreeable. 

## 2012-04-04 ENCOUNTER — Encounter (HOSPITAL_COMMUNITY)
Admission: RE | Disposition: A | Discharge status: Home / Self Care | Payer: Self-pay | Source: Ambulatory Visit | Attending: Internal Medicine

## 2012-04-04 ENCOUNTER — Encounter (HOSPITAL_COMMUNITY): Payer: Self-pay | Admitting: *Deleted

## 2012-04-04 ENCOUNTER — Ambulatory Visit (HOSPITAL_COMMUNITY)
Admission: RE | Admit: 2012-04-04 | Discharge: 2012-04-04 | Disposition: A | Discharge status: Home / Self Care | Payer: PRIVATE HEALTH INSURANCE | Source: Ambulatory Visit | Attending: Internal Medicine | Admitting: Internal Medicine

## 2012-04-04 ENCOUNTER — Telehealth: Payer: Self-pay | Admitting: Internal Medicine

## 2012-04-04 DIAGNOSIS — K2940 Chronic atrophic gastritis without bleeding: Secondary | ICD-10-CM | POA: Insufficient documentation

## 2012-04-04 DIAGNOSIS — R112 Nausea with vomiting, unspecified: Secondary | ICD-10-CM | POA: Insufficient documentation

## 2012-04-04 DIAGNOSIS — R109 Unspecified abdominal pain: Secondary | ICD-10-CM | POA: Insufficient documentation

## 2012-04-04 DIAGNOSIS — E119 Type 2 diabetes mellitus without complications: Secondary | ICD-10-CM | POA: Insufficient documentation

## 2012-04-04 DIAGNOSIS — K257 Chronic gastric ulcer without hemorrhage or perforation: Secondary | ICD-10-CM | POA: Insufficient documentation

## 2012-04-04 DIAGNOSIS — R935 Abnormal findings on diagnostic imaging of other abdominal regions, including retroperitoneum: Secondary | ICD-10-CM

## 2012-04-04 DIAGNOSIS — R933 Abnormal findings on diagnostic imaging of other parts of digestive tract: Secondary | ICD-10-CM

## 2012-04-04 DIAGNOSIS — E785 Hyperlipidemia, unspecified: Secondary | ICD-10-CM | POA: Insufficient documentation

## 2012-04-04 HISTORY — PX: ESOPHAGOGASTRODUODENOSCOPY: SHX5428

## 2012-04-04 SURGERY — EGD (ESOPHAGOGASTRODUODENOSCOPY)
Anesthesia: Moderate Sedation

## 2012-04-04 MED ORDER — SODIUM CHLORIDE 0.45 % IV SOLN
INTRAVENOUS | Status: DC
  Administered 2012-04-04: 08:00:00 via INTRAVENOUS

## 2012-04-04 MED ORDER — MIDAZOLAM HCL 5 MG/5ML IJ SOLN
INTRAMUSCULAR | Status: AC
  Filled 2012-04-04: qty 10

## 2012-04-04 MED ORDER — STERILE WATER FOR IRRIGATION IR SOLN
Status: DC | PRN
  Administered 2012-04-04: 08:00:00

## 2012-04-04 MED ORDER — FENTANYL CITRATE 0.05 MG/ML IJ SOLN
INTRAMUSCULAR | Status: DC | PRN
  Administered 2012-04-04 (×2): 25 ug via INTRAVENOUS

## 2012-04-04 MED ORDER — MEPERIDINE HCL 50 MG/ML IJ SOLN
INTRAMUSCULAR | Status: AC
  Filled 2012-04-04: qty 1

## 2012-04-04 MED ORDER — FENTANYL CITRATE 0.05 MG/ML IJ SOLN
INTRAMUSCULAR | Status: AC
  Filled 2012-04-04: qty 2

## 2012-04-04 MED ORDER — MIDAZOLAM HCL 5 MG/5ML IJ SOLN
INTRAMUSCULAR | Status: DC | PRN
  Administered 2012-04-04 (×4): 2 mg via INTRAVENOUS

## 2012-04-04 MED ORDER — BUTAMBEN-TETRACAINE-BENZOCAINE 2-2-14 % EX AERO
INHALATION_SPRAY | CUTANEOUS | Status: DC | PRN
  Administered 2012-04-04: 2 via TOPICAL

## 2012-04-04 NOTE — Telephone Encounter (Signed)
noted 

## 2012-04-04 NOTE — Telephone Encounter (Signed)
pts wife called today asking if pt could have noodles in chicken noodle soup. EGD report reviewed; no post procedure N/V noted; I recommended full liquids for today and advance diet slowly over weekend - reinterated recommendations from Dr. Lorrin Jackson PPI and no NSAIDS)

## 2012-04-04 NOTE — H&P (Signed)
[name] is an 61 y.o. male.   Chief Complaint: Patient is here for esophagogastroduodenoscopy. HPI:  Patient is 61 year old Caucasian male who became acutely ill 10 days ago with severe pain across his upper abdomen with nausea and vomiting. He was seen in emergency room. He had renal calculi without obstruction. The thickening to distal stomach. One of his acute symptoms have resolved. Because of CT findings further evaluation is advised. He denies anorexia or involuntary weight loss. He also denies melena or rectal bleeding. He may have occasional fullness at left mid abdomen after meals. I have reviewed the patient's abdominopelvic CT Past Medical History  Diagnosis Date  . ASCVD (arteriosclerotic cardiovascular disease)     LAD stent in 1999; RCA bare-metal stent in 12/2007, LAD stent patent, 50-60% proximal Cx lesion treated medically  . AAA (abdominal aortic aneurysm)     3.3 cm in 2012  . Hyperlipidemia   . Diabetes mellitus   . Thrombocytopenia     Chronic; evaluated by Dr. Mariel Sleet in 2007  . Tobacco abuse   . Hepatic steatosis   . ED (erectile dysfunction)   . Nephrolithiasis   . Anxiety   . Depression   . Insomnia     Past Surgical History  Procedure Date  . Coronary artery bypass graft   . Lithotripsy   . Cardiac surgery   . Inguinal hernia repair   . Coronary angioplasty with stent placement     1999, 2009    Family History  Problem Relation Age of Onset  . Ovarian cancer Sister    Social History:  reports that he has been smoking Cigarettes.  He has a 20 pack-year smoking history. He does not have any smokeless tobacco history on file. He reports that he does not drink alcohol or use illicit drugs.  Allergies:  Allergies  Allergen Reactions  . Meperidine Hcl Other (See Comments)    Was mixed with phenergan and turned blood vessels red.   . Promethazine Hcl Other (See Comments)    Was mixed with the Demerol and caused veins to turn red.      Medications Prior to Admission  Medication Sig Dispense Refill  . ALPRAZolam (XANAX) 0.5 MG tablet Take 0.5 mg by mouth at bedtime as needed. Sleep      . amLODipine (NORVASC) 2.5 MG tablet Take 2.5 mg by mouth daily.        Marland Kitchen aspirin 81 MG tablet Take 81 mg by mouth daily.        Marland Kitchen atorvastatin (LIPITOR) 20 MG tablet Take 20 mg by mouth daily.      . fish oil-omega-3 fatty acids 1000 MG capsule Take 1 capsule by mouth 2 (two) times daily.        . Linagliptin-Metformin HCl (JENTADUETO) 2.5-500 MG TABS Take 2 tablets by mouth 2 (two) times daily.       . meloxicam (MOBIC) 7.5 MG tablet Take 7.5 mg by mouth 2 (two) times daily.        . metoprolol succinate (TOPROL-XL) 100 MG 24 hr tablet Take 100 mg by mouth 2 (two) times daily. Take with or immediately following a meal.      . pantoprazole (PROTONIX) 20 MG tablet Take twice a day for 2 weeks then once a day  60 tablet  0  . Potassium Citrate (UROCIT-K 15) 15 MEQ (1620 MG) TBCR Take 1 tablet by mouth 2 (two) times daily.      . ramipril (ALTACE) 10 MG  tablet Take 10 mg by mouth daily.        . tadalafil (CIALIS) 10 MG tablet Take 10 mg by mouth daily as needed.      . zolpidem (AMBIEN) 10 MG tablet Take 10 mg by mouth at bedtime as needed.          No results found for this or any previous visit (from the past 48 hour(s)). No results found.  ROS  Blood pressure 120/67, pulse 74, temperature 97.9 F (36.6 C), temperature source Oral, resp. rate 14, SpO2 98.00%. Physical Exam  Constitutional: He appears well-developed and well-nourished.  HENT:  Mouth/Throat: Oropharynx is clear and moist.  Eyes: Conjunctivae normal are normal. No scleral icterus.  Neck: No thyromegaly present.  Cardiovascular: Normal rate, regular rhythm and normal heart sounds.   No murmur heard. Respiratory: Effort normal and breath sounds normal.  GI: Soft. He exhibits no distension and no mass. There is no tenderness. There is no guarding.  Musculoskeletal:  He exhibits no edema.  Lymphadenopathy:    He has no cervical adenopathy.  Neurological: He is alert.  Skin: Skin is warm.     Assessment/Plan Abnormal CT revealing thickening to gastric wall. Study done in emergency room for acute symptoms which have resolved. Diagnostic EGD.  [name] 04/04/2012, 8:24 AM

## 2012-04-04 NOTE — Op Note (Signed)
EGD PROCEDURE REPORT  PATIENT:  [name]  MR#:  [id number] Birthdate:  07/06/1950, 61 y.o., male Endoscopist:  Dr. Malissa Hippo, MD Referred By:  Dr. Colette Ribas, MD Procedure Date: 04/04/2012  Procedure:   EGD  Indications:  Patient is 61 year old Caucasian male who was seen in emergency room for acute onset of nausea vomiting abdominal pain and at CT revealing thickening to gastric antrum. He is undergoing diagnostic EGD. Patient states he has lost 20 pounds voluntarily since he retired 9 or 10 months ago. Patient is on low-dose aspirin and meloxicam.            Informed Consent:  The risks, benefits, alternatives & imponderables which include, but are not limited to, bleeding, infection, perforation, drug reaction and potential missed lesion have been reviewed.  The potential for biopsy, lesion removal, esophageal dilation, etc. have also been discussed.  Questions have been answered.  All parties agreeable.  Please see history & physical in medical record for more information.  Medications:  Demerol 50 mg IV Versed 8 mg IV Cetacaine spray topically for oropharyngeal anesthesia  Description of procedure:  The endoscope was introduced through the mouth and advanced to the second portion of the duodenum without difficulty or limitations. The mucosal surfaces were surveyed very carefully during advancement of the scope and upon withdrawal.  Findings:  Esophagus:  Mucosa of the esophagus was normal. GE junction was unremarkable. GEJ:  43.cm Stomach:  Moderate to large amount of food debris noted in the stomach. Therefore examination was limited. Large ulcer noted at antrum involving two thirds of the circumference about 15 mm wide in the middle. Pyloric channel was patent. End breast fundus and cardia were examined by retroflexing the scope and were normal. Duodenum:  Normal bulbar and post bulbar mucosa.  Therapeutic/Diagnostic Maneuvers Performed:  Multiple biopsies taken  from ulcer margin.  Complications:  None  Impression: Large ulcer at gastric antrum involving two thirds of the circumference. Suspect chronic benign ulcer secondary to NSAID therapy. He could also have H. pylori infection as well Food debris in the stomach with patent pylorus.  Recommendations:  Discontinue meloxicam. Hold aspirin for disease 2 weeks. Continue pantoprazole 40 mg by mouth twice a day. I will contact patient with biopsy results and further recommendations.  REHMAN,NAJEEB U  04/04/2012  8:53 AM  CC: Dr. Colette Ribas, MD & Dr. Bonnetta Barry ref. provider found

## 2012-04-08 ENCOUNTER — Other Ambulatory Visit (INDEPENDENT_AMBULATORY_CARE_PROVIDER_SITE_OTHER): Payer: Self-pay | Admitting: Internal Medicine

## 2012-04-08 ENCOUNTER — Telehealth (INDEPENDENT_AMBULATORY_CARE_PROVIDER_SITE_OTHER): Payer: Self-pay | Admitting: *Deleted

## 2012-04-08 DIAGNOSIS — R109 Unspecified abdominal pain: Secondary | ICD-10-CM

## 2012-04-08 NOTE — Telephone Encounter (Signed)
Per Dr.Rehman the patient needs to have H-Pylori done

## 2012-04-09 ENCOUNTER — Ambulatory Visit (HOSPITAL_COMMUNITY)
Admission: RE | Admit: 2012-04-09 | Discharge: 2012-04-09 | Disposition: A | Discharge status: Home / Self Care | Payer: PRIVATE HEALTH INSURANCE | Source: Ambulatory Visit | Attending: Urology | Admitting: Urology

## 2012-04-09 ENCOUNTER — Other Ambulatory Visit: Payer: Self-pay | Admitting: Urology

## 2012-04-09 ENCOUNTER — Encounter (INDEPENDENT_AMBULATORY_CARE_PROVIDER_SITE_OTHER): Payer: Self-pay | Admitting: *Deleted

## 2012-04-09 ENCOUNTER — Encounter (HOSPITAL_COMMUNITY): Payer: Self-pay | Admitting: Internal Medicine

## 2012-04-09 DIAGNOSIS — N201 Calculus of ureter: Secondary | ICD-10-CM

## 2012-04-09 DIAGNOSIS — N200 Calculus of kidney: Secondary | ICD-10-CM | POA: Insufficient documentation

## 2012-04-09 LAB — H. PYLORI ANTIBODY, IGG: H Pylori IgG: 0.6 {ISR}

## 2012-04-10 ENCOUNTER — Encounter (INDEPENDENT_AMBULATORY_CARE_PROVIDER_SITE_OTHER): Payer: Self-pay | Admitting: *Deleted

## 2012-04-11 ENCOUNTER — Ambulatory Visit (INDEPENDENT_AMBULATORY_CARE_PROVIDER_SITE_OTHER): Payer: No Typology Code available for payment source | Admitting: Urology

## 2012-04-11 DIAGNOSIS — N210 Calculus in bladder: Secondary | ICD-10-CM

## 2012-04-11 DIAGNOSIS — N200 Calculus of kidney: Secondary | ICD-10-CM

## 2012-04-11 DIAGNOSIS — N401 Enlarged prostate with lower urinary tract symptoms: Secondary | ICD-10-CM

## 2012-04-14 ENCOUNTER — Telehealth (INDEPENDENT_AMBULATORY_CARE_PROVIDER_SITE_OTHER): Payer: Self-pay | Admitting: *Deleted

## 2012-04-14 NOTE — Telephone Encounter (Signed)
Per Dr Karilyn Cota op note, patient needs repeat EGD in 12 weeks, added to recall, patient aware I will mail letter as it gets closer to recall date

## 2012-04-14 NOTE — Telephone Encounter (Signed)
Patient rec'd letter and called our office for results. Results given. He would like to know when his next EGD will be. Next one is in 12 weeks per [facility]

## 2012-07-07 ENCOUNTER — Encounter (INDEPENDENT_AMBULATORY_CARE_PROVIDER_SITE_OTHER): Payer: Self-pay | Admitting: *Deleted

## 2012-07-09 ENCOUNTER — Other Ambulatory Visit (INDEPENDENT_AMBULATORY_CARE_PROVIDER_SITE_OTHER): Payer: Self-pay | Admitting: *Deleted

## 2012-07-09 ENCOUNTER — Encounter (INDEPENDENT_AMBULATORY_CARE_PROVIDER_SITE_OTHER): Payer: Self-pay | Admitting: *Deleted

## 2012-07-09 ENCOUNTER — Encounter (HOSPITAL_COMMUNITY): Payer: Self-pay | Admitting: Pharmacy Technician

## 2012-07-09 ENCOUNTER — Telehealth (INDEPENDENT_AMBULATORY_CARE_PROVIDER_SITE_OTHER): Payer: Self-pay | Admitting: *Deleted

## 2012-07-09 DIAGNOSIS — K259 Gastric ulcer, unspecified as acute or chronic, without hemorrhage or perforation: Secondary | ICD-10-CM

## 2012-07-09 NOTE — Telephone Encounter (Signed)
  Procedure: egd  Reason/Indication:  Gastric ulcer  Has patient had this procedure before?  yes  If so, when, by whom and where?  04/2012  Is there a family history of colon cancer?    Who?  What age when diagnosed?    Is patient diabetic?   yes      Does patient have prosthetic heart valve?  no  Do you have a pacemaker?  no  Has patient had joint replacement within last 12 months?  no  Is patient on Coumadin, Plavix and/or Aspirin? yes  Medications: see EPIC  Allergies: see EPIC  Medication Adjustment: asa 2 days  Procedure date & time: 07/25/12 at 830

## 2012-07-09 NOTE — Telephone Encounter (Signed)
agree

## 2012-07-25 ENCOUNTER — Encounter (HOSPITAL_COMMUNITY): Payer: Self-pay | Admitting: *Deleted

## 2012-07-25 ENCOUNTER — Ambulatory Visit (HOSPITAL_COMMUNITY)
Admission: RE | Admit: 2012-07-25 | Discharge: 2012-07-25 | Disposition: A | Discharge status: Home / Self Care | Payer: PRIVATE HEALTH INSURANCE | Source: Ambulatory Visit | Attending: Internal Medicine | Admitting: Internal Medicine

## 2012-07-25 ENCOUNTER — Encounter (HOSPITAL_COMMUNITY)
Admission: RE | Disposition: A | Discharge status: Home / Self Care | Payer: Self-pay | Source: Ambulatory Visit | Attending: Internal Medicine

## 2012-07-25 DIAGNOSIS — R1013 Epigastric pain: Secondary | ICD-10-CM | POA: Insufficient documentation

## 2012-07-25 DIAGNOSIS — R112 Nausea with vomiting, unspecified: Secondary | ICD-10-CM | POA: Insufficient documentation

## 2012-07-25 DIAGNOSIS — E119 Type 2 diabetes mellitus without complications: Secondary | ICD-10-CM | POA: Insufficient documentation

## 2012-07-25 DIAGNOSIS — K2970 Gastritis, unspecified, without bleeding: Secondary | ICD-10-CM | POA: Insufficient documentation

## 2012-07-25 DIAGNOSIS — Z01812 Encounter for preprocedural laboratory examination: Secondary | ICD-10-CM | POA: Insufficient documentation

## 2012-07-25 DIAGNOSIS — K2990 Gastroduodenitis, unspecified, without bleeding: Secondary | ICD-10-CM | POA: Insufficient documentation

## 2012-07-25 DIAGNOSIS — K259 Gastric ulcer, unspecified as acute or chronic, without hemorrhage or perforation: Secondary | ICD-10-CM

## 2012-07-25 HISTORY — DX: Gastric ulcer, unspecified as acute or chronic, without hemorrhage or perforation: K25.9

## 2012-07-25 HISTORY — PX: ESOPHAGOGASTRODUODENOSCOPY: SHX5428

## 2012-07-25 LAB — GLUCOSE, CAPILLARY

## 2012-07-25 SURGERY — EGD (ESOPHAGOGASTRODUODENOSCOPY)
Anesthesia: Moderate Sedation

## 2012-07-25 MED ORDER — BUTAMBEN-TETRACAINE-BENZOCAINE 2-2-14 % EX AERO
INHALATION_SPRAY | CUTANEOUS | Status: DC | PRN
  Administered 2012-07-25: 2 via TOPICAL

## 2012-07-25 MED ORDER — FENTANYL CITRATE 0.05 MG/ML IJ SOLN
INTRAMUSCULAR | Status: DC | PRN
  Administered 2012-07-25 (×2): 25 ug via INTRAVENOUS

## 2012-07-25 MED ORDER — MIDAZOLAM HCL 5 MG/5ML IJ SOLN
INTRAMUSCULAR | Status: AC
  Filled 2012-07-25: qty 5

## 2012-07-25 MED ORDER — SODIUM CHLORIDE 0.45 % IV SOLN
INTRAVENOUS | Status: DC
  Administered 2012-07-25: 08:00:00 via INTRAVENOUS

## 2012-07-25 MED ORDER — MEPERIDINE HCL 50 MG/ML IJ SOLN
INTRAMUSCULAR | Status: AC
  Filled 2012-07-25: qty 1

## 2012-07-25 MED ORDER — FENTANYL CITRATE 0.05 MG/ML IJ SOLN
INTRAMUSCULAR | Status: AC
  Filled 2012-07-25: qty 2

## 2012-07-25 MED ORDER — MIDAZOLAM HCL 5 MG/5ML IJ SOLN
INTRAMUSCULAR | Status: DC | PRN
  Administered 2012-07-25 (×3): 2 mg via INTRAVENOUS

## 2012-07-25 MED ORDER — STERILE WATER FOR IRRIGATION IR SOLN
Status: DC | PRN
  Administered 2012-07-25: 09:00:00

## 2012-07-25 MED ORDER — MIDAZOLAM HCL 5 MG/5ML IJ SOLN
INTRAMUSCULAR | Status: AC
  Filled 2012-07-25: qty 10

## 2012-07-25 NOTE — H&P (Signed)
[name] is an 62 y.o. male.   Chief Complaint: Patient is here for EGD. HPI: Patient is 63 year old Caucasian male who was found to have large gastric ulcer in October 2013. Histology was benign. He is returning for repeat EGD to document complete healing. He denies nausea vomiting abdominal pain melena or rectal bleeding. He has not lost any weight.  Past Medical History  Diagnosis Date  . ASCVD (arteriosclerotic cardiovascular disease)     LAD stent in 1999; RCA bare-metal stent in 12/2007, LAD stent patent, 50-60% proximal Cx lesion treated medically  . AAA (abdominal aortic aneurysm)     3.3 cm in 2012  . Hyperlipidemia   . Diabetes mellitus   . Thrombocytopenia     Chronic; evaluated by Dr. Mariel Sleet in 2007  . Tobacco abuse   . Hepatic steatosis   . ED (erectile dysfunction)   . Nephrolithiasis   . Anxiety   . Depression   . Insomnia   . Gastric ulcer     Past Surgical History  Procedure Date  . Coronary artery bypass graft   . Lithotripsy   . Cardiac surgery   . Inguinal hernia repair   . Coronary angioplasty with stent placement     1999, 2009  . Esophagogastroduodenoscopy 04/04/2012    Procedure: ESOPHAGOGASTRODUODENOSCOPY (EGD);  Surgeon: Malissa Hippo, MD;  Location: [facility];  Service: Endoscopy;  Laterality: N/A;  830    Family History  Problem Relation Age of Onset  . Ovarian cancer Sister    Social History:  reports that he has been smoking Cigarettes.  He has a 20 pack-year smoking history. He does not have any smokeless tobacco history on file. He reports that he does not drink alcohol or use illicit drugs.  Allergies:  Allergies  Allergen Reactions  . Meperidine Hcl Other (See Comments)    Was mixed with phenergan and turned blood vessels red.   . Promethazine Hcl Other (See Comments)    Was mixed with the Demerol and caused veins to turn red.     Medications Prior to Admission  Medication Sig Dispense Refill  . ALPRAZolam (XANAX)  0.5 MG tablet Take 0.5 mg by mouth at bedtime as needed. Sleep      . amLODipine (NORVASC) 2.5 MG tablet Take 2.5 mg by mouth daily.        Marland Kitchen atorvastatin (LIPITOR) 20 MG tablet Take 20 mg by mouth daily.      . fish oil-omega-3 fatty acids 1000 MG capsule Take 1 capsule by mouth 2 (two) times daily.        . Linagliptin-Metformin HCl (JENTADUETO) 2.5-500 MG TABS Take 2 tablets by mouth 2 (two) times daily.       . metoprolol succinate (TOPROL-XL) 50 MG 24 hr tablet Take 50 mg by mouth daily. Take with or immediately following a meal.      . pantoprazole (PROTONIX) 20 MG tablet Take 40 mg by mouth daily.      . Potassium Citrate (UROCIT-K 15) 15 MEQ (1620 MG) TBCR Take 1 tablet by mouth 2 (two) times daily.      . ramipril (ALTACE) 10 MG tablet Take 10 mg by mouth daily.        Marland Kitchen aspirin EC 81 MG tablet Take 81 mg by mouth daily.      . tadalafil (CIALIS) 10 MG tablet Take 10 mg by mouth daily as needed.      . zolpidem (AMBIEN) 10 MG tablet Take  10 mg by mouth at bedtime as needed. Sleep        Results for orders placed during the hospital encounter of 07/25/12 (from the past 48 hour(s))  GLUCOSE, CAPILLARY     Status: Abnormal   Collection Time   07/25/12  7:53 AM      Component Value Range Comment   Glucose-Capillary 133 (*) 70 - 99 mg/dL    Comment 1 Documented in Chart      No results found.  ROS  Blood pressure 137/88, pulse 62, temperature 97.7 F (36.5 C), temperature source Oral, resp. rate 19, height 5\' 11"  (1.803 m), weight 164 lb (74.39 kg), SpO2 99.00%. Physical Exam  Constitutional: He appears well-developed and well-nourished.  HENT:  Mouth/Throat: Oropharynx is clear and moist.  Eyes: Conjunctivae normal are normal. No scleral icterus.  Neck: No thyromegaly present.  Cardiovascular: Normal rate, regular rhythm and normal heart sounds.   No murmur heard. Respiratory: Effort normal and breath sounds normal.  GI: He exhibits no distension and no mass. There is no  tenderness.  Musculoskeletal: He exhibits no edema.  Lymphadenopathy:    He has no cervical adenopathy.  Neurological: He is alert.  Skin: Skin is warm and dry.     Assessment/Plan History of gastric ulcer. EGD.  [name] 07/25/2012, 8:44 AM

## 2012-07-25 NOTE — Op Note (Signed)
EGD PROCEDURE REPORT  PATIENT:  [name]  MR#:  [id number] Birthdate:  1950/11/15, 62 y.o., male Endoscopist:  Dr. Malissa Hippo, MD Referred By:  Dr. Colette Ribas, MD Procedure Date: 07/25/2012  Procedure:   EGD  Indications:  Patient is 62 year old Caucasian male who was found to have large gastric ulcer in October 2013. He presented emergency room with epigastric pain nausea and vomiting and CT revealed marked thickening to gastric wall leading to EGD which revealed large antral ulcer involving half the circumference. Biopsy revealed benign etiology and H. pylori stains and serology were negative. He's been asymptomatic on PPI. He is on low-dose aspirin. He is undergoing EGD to document complete healing of this ulcer.            Informed Consent:  The risks, benefits, alternatives & imponderables which include, but are not limited to, bleeding, infection, perforation, drug reaction and potential missed lesion have been reviewed.  The potential for biopsy, lesion removal, esophageal dilation, etc. have also been discussed.  Questions have been answered.  All parties agreeable.  Please see history & physical in medical record for more information.  Medications:  Fentanyl 50 mcg IV Versed 6 mg IV Cetacaine spray topically for oropharyngeal anesthesia  Description of procedure:  The endoscope was introduced through the mouth and advanced to the second portion of the duodenum without difficulty or limitations. The mucosal surfaces were surveyed very carefully during advancement of the scope and upon withdrawal.  Findings:  Esophagus:  Mucosa of the esophagus was normal. GE junction was unremarkable. GEJ:  41 cm Stomach:  Stomach was empty and distended very well with insufflation. Folds in the proximal stomach were normal. Examination of mucosa revealed single erosion at antrum and focal erythema. Large scar noted at antrum site of previous ulceration. Pyloric channel was patent.  Annularis fundus and cardia were examined by retroflexing the scope and were normal. Duodenum:  Normal bulbar and post bulbar mucosa.  Therapeutic/Diagnostic Maneuvers Performed:  None  Complications:  None  Impression: Gastric ulcer has completely healed. Antral gastritis with single erosion [name] secondary to aspirin use.   Recommendations:  Continue pantoprazole at 40 mg by mouth every morning. Can stay on aspirin 81 mg by mouth daily but do not take other NSAIDs.   [name] U  07/25/2012  9:09 AM  CC: Dr. Phillips Odor, Chancy Hurter, MD & Dr. Bonnetta Barry ref. provider found

## 2012-07-28 ENCOUNTER — Encounter (HOSPITAL_COMMUNITY): Payer: Self-pay | Admitting: Internal Medicine

## 2012-10-02 ENCOUNTER — Encounter (HOSPITAL_COMMUNITY): Payer: Self-pay | Admitting: *Deleted

## 2012-10-02 ENCOUNTER — Emergency Department (HOSPITAL_COMMUNITY): Payer: PRIVATE HEALTH INSURANCE

## 2012-10-02 ENCOUNTER — Inpatient Hospital Stay (HOSPITAL_COMMUNITY)
Admission: EM | Admit: 2012-10-02 | Discharge: 2012-10-11 | DRG: 236 | Disposition: A | Discharge status: Home / Self Care | Payer: PRIVATE HEALTH INSURANCE | Attending: Thoracic Surgery (Cardiothoracic Vascular Surgery) | Admitting: Thoracic Surgery (Cardiothoracic Vascular Surgery)

## 2012-10-02 DIAGNOSIS — G4700 Insomnia, unspecified: Secondary | ICD-10-CM | POA: Diagnosis present

## 2012-10-02 DIAGNOSIS — F17200 Nicotine dependence, unspecified, uncomplicated: Secondary | ICD-10-CM | POA: Diagnosis present

## 2012-10-02 DIAGNOSIS — R079 Chest pain, unspecified: Secondary | ICD-10-CM | POA: Diagnosis present

## 2012-10-02 DIAGNOSIS — Z7982 Long term (current) use of aspirin: Secondary | ICD-10-CM

## 2012-10-02 DIAGNOSIS — F329 Major depressive disorder, single episode, unspecified: Secondary | ICD-10-CM | POA: Diagnosis present

## 2012-10-02 DIAGNOSIS — E8779 Other fluid overload: Secondary | ICD-10-CM | POA: Diagnosis not present

## 2012-10-02 DIAGNOSIS — E785 Hyperlipidemia, unspecified: Secondary | ICD-10-CM | POA: Diagnosis present

## 2012-10-02 DIAGNOSIS — F411 Generalized anxiety disorder: Secondary | ICD-10-CM | POA: Diagnosis present

## 2012-10-02 DIAGNOSIS — I252 Old myocardial infarction: Secondary | ICD-10-CM

## 2012-10-02 DIAGNOSIS — Z9861 Coronary angioplasty status: Secondary | ICD-10-CM

## 2012-10-02 DIAGNOSIS — E1169 Type 2 diabetes mellitus with other specified complication: Secondary | ICD-10-CM | POA: Diagnosis present

## 2012-10-02 DIAGNOSIS — I200 Unstable angina: Secondary | ICD-10-CM

## 2012-10-02 DIAGNOSIS — N529 Male erectile dysfunction, unspecified: Secondary | ICD-10-CM | POA: Diagnosis present

## 2012-10-02 DIAGNOSIS — E119 Type 2 diabetes mellitus without complications: Secondary | ICD-10-CM

## 2012-10-02 DIAGNOSIS — K7689 Other specified diseases of liver: Secondary | ICD-10-CM | POA: Diagnosis present

## 2012-10-02 DIAGNOSIS — D62 Acute posthemorrhagic anemia: Secondary | ICD-10-CM | POA: Diagnosis not present

## 2012-10-02 DIAGNOSIS — F3289 Other specified depressive episodes: Secondary | ICD-10-CM | POA: Diagnosis present

## 2012-10-02 DIAGNOSIS — I714 Abdominal aortic aneurysm, without rupture, unspecified: Secondary | ICD-10-CM | POA: Diagnosis present

## 2012-10-02 DIAGNOSIS — Z79899 Other long term (current) drug therapy: Secondary | ICD-10-CM

## 2012-10-02 DIAGNOSIS — D696 Thrombocytopenia, unspecified: Secondary | ICD-10-CM | POA: Diagnosis present

## 2012-10-02 DIAGNOSIS — I2510 Atherosclerotic heart disease of native coronary artery without angina pectoris: Principal | ICD-10-CM | POA: Diagnosis present

## 2012-10-02 DIAGNOSIS — Z951 Presence of aortocoronary bypass graft: Secondary | ICD-10-CM

## 2012-10-02 DIAGNOSIS — I10 Essential (primary) hypertension: Secondary | ICD-10-CM | POA: Diagnosis present

## 2012-10-02 DIAGNOSIS — Z87442 Personal history of urinary calculi: Secondary | ICD-10-CM

## 2012-10-02 DIAGNOSIS — Z8711 Personal history of peptic ulcer disease: Secondary | ICD-10-CM

## 2012-10-02 HISTORY — DX: Abdominal aortic aneurysm, without rupture: I71.4

## 2012-10-02 HISTORY — DX: Presence of aortocoronary bypass graft: Z95.1

## 2012-10-02 HISTORY — DX: Type 2 diabetes mellitus without complications: E11.9

## 2012-10-02 HISTORY — DX: Atherosclerotic heart disease of native coronary artery without angina pectoris: I25.10

## 2012-10-02 HISTORY — DX: Thrombocytopenia, unspecified: D69.6

## 2012-10-02 HISTORY — DX: Acute myocardial infarction, unspecified: I21.9

## 2012-10-02 HISTORY — DX: Calculus of kidney: N20.0

## 2012-10-02 HISTORY — DX: Hyperlipidemia, unspecified: E78.5

## 2012-10-02 LAB — CBC
Platelets: 124 10*3/uL — ABNORMAL LOW (ref 150–400)
RBC: 5.03 MIL/uL (ref 4.22–5.81)
WBC: 9.2 10*3/uL (ref 4.0–10.5)

## 2012-10-02 LAB — POCT I-STAT, CHEM 8
BUN: 26 mg/dL — ABNORMAL HIGH (ref 6–23)
Chloride: 107 mEq/L (ref 96–112)
Sodium: 142 mEq/L (ref 135–145)

## 2012-10-02 LAB — POCT I-STAT TROPONIN I: Troponin i, poc: 0 ng/mL (ref 0.00–0.08)

## 2012-10-02 MED ORDER — POTASSIUM CITRATE ER 10 MEQ (1080 MG) PO TBCR
10.0000 meq | EXTENDED_RELEASE_TABLET | Freq: Two times a day (BID) | ORAL | Status: DC
  Administered 2012-10-03 – 2012-10-05 (×5): 10 meq via ORAL
  Filled 2012-10-02 (×6): qty 1

## 2012-10-02 MED ORDER — PANTOPRAZOLE SODIUM 20 MG PO TBEC
20.0000 mg | DELAYED_RELEASE_TABLET | Freq: Every day | ORAL | Status: DC
  Administered 2012-10-03 – 2012-10-06 (×4): 20 mg via ORAL
  Filled 2012-10-02 (×5): qty 1

## 2012-10-02 MED ORDER — RAMIPRIL 10 MG PO CAPS
10.0000 mg | ORAL_CAPSULE | Freq: Every day | ORAL | Status: DC
  Administered 2012-10-03 – 2012-10-06 (×4): 10 mg via ORAL
  Filled 2012-10-02 (×5): qty 1

## 2012-10-02 MED ORDER — ZOLPIDEM TARTRATE 5 MG PO TABS: 10.0000 mg | ORAL_TABLET | Freq: Every evening | ORAL | Status: DC | PRN

## 2012-10-02 MED ORDER — NITROGLYCERIN 0.4 MG/HR TD PT24: 0.4000 mg | MEDICATED_PATCH | Freq: Every day | TRANSDERMAL | Status: DC

## 2012-10-02 MED ORDER — POTASSIUM CITRATE ER 15 MEQ (1620 MG) PO TBCR: 1.0000 | EXTENDED_RELEASE_TABLET | Freq: Two times a day (BID) | ORAL | Status: DC

## 2012-10-02 MED ORDER — ALPRAZOLAM 0.5 MG PO TABS
0.5000 mg | ORAL_TABLET | Freq: Every evening | ORAL | Status: DC | PRN
  Administered 2012-10-07: 0.5 mg via ORAL
  Filled 2012-10-02: qty 1

## 2012-10-02 MED ORDER — AMLODIPINE BESYLATE 2.5 MG PO TABS
2.5000 mg | ORAL_TABLET | Freq: Every day | ORAL | Status: DC
  Administered 2012-10-03 – 2012-10-06 (×4): 2.5 mg via ORAL
  Filled 2012-10-02 (×7): qty 1

## 2012-10-02 MED ORDER — SODIUM CHLORIDE 0.9 % IV SOLN
INTRAVENOUS | Status: AC
  Administered 2012-10-02: 22:00:00 via INTRAVENOUS

## 2012-10-02 MED ORDER — SODIUM CHLORIDE 0.9 % IJ SOLN: 3.0000 mL | Freq: Two times a day (BID) | INTRAMUSCULAR | Status: DC

## 2012-10-02 MED ORDER — ASPIRIN 81 MG PO CHEW
324.0000 mg | CHEWABLE_TABLET | Freq: Once | ORAL | Status: AC
  Administered 2012-10-02: 324 mg via ORAL
  Filled 2012-10-02: qty 4

## 2012-10-02 MED ORDER — OMEGA-3-ACID ETHYL ESTERS 1 G PO CAPS
1.0000 g | ORAL_CAPSULE | Freq: Two times a day (BID) | ORAL | Status: DC
  Administered 2012-10-03 – 2012-10-04 (×3): 1 g via ORAL
  Filled 2012-10-02 (×4): qty 1

## 2012-10-02 MED ORDER — ASPIRIN EC 325 MG PO TBEC
325.0000 mg | DELAYED_RELEASE_TABLET | Freq: Every day | ORAL | Status: DC
  Administered 2012-10-04 – 2012-10-06 (×3): 325 mg via ORAL
  Filled 2012-10-02 (×5): qty 1

## 2012-10-02 MED ORDER — INSULIN ASPART 100 UNIT/ML ~~LOC~~ SOLN
0.0000 [IU] | Freq: Three times a day (TID) | SUBCUTANEOUS | Status: DC
  Administered 2012-10-04 (×2): 1 [IU] via SUBCUTANEOUS
  Administered 2012-10-04: 2 [IU] via SUBCUTANEOUS
  Administered 2012-10-05 – 2012-10-06 (×4): 1 [IU] via SUBCUTANEOUS
  Administered 2012-10-06: 2 [IU] via SUBCUTANEOUS

## 2012-10-02 MED ORDER — SODIUM CHLORIDE 0.9 % IV SOLN
INTRAVENOUS | Status: DC
  Administered 2012-10-03: 20 mL/h via INTRAVENOUS
  Administered 2012-10-03: 1000 mL via INTRAVENOUS

## 2012-10-02 MED ORDER — ONDANSETRON HCL 4 MG PO TABS
4.0000 mg | ORAL_TABLET | Freq: Four times a day (QID) | ORAL | Status: DC | PRN
  Filled 2012-10-02: qty 0.5

## 2012-10-02 MED ORDER — ENOXAPARIN SODIUM 40 MG/0.4ML ~~LOC~~ SOLN: 40.0000 mg | SUBCUTANEOUS | Status: DC

## 2012-10-02 MED ORDER — RAMIPRIL 10 MG PO TABS: 10.0000 mg | ORAL_TABLET | Freq: Every day | ORAL | Status: DC

## 2012-10-02 MED ORDER — ACETAMINOPHEN 325 MG PO TABS
650.0000 mg | ORAL_TABLET | Freq: Four times a day (QID) | ORAL | Status: DC | PRN
  Administered 2012-10-04 – 2012-10-05 (×2): 650 mg via ORAL
  Filled 2012-10-02 (×2): qty 2

## 2012-10-02 MED ORDER — NITROGLYCERIN 0.4 MG/HR TD PT24
0.4000 mg | MEDICATED_PATCH | Freq: Every day | TRANSDERMAL | Status: DC
  Administered 2012-10-03 – 2012-10-06 (×5): 0.4 mg via TRANSDERMAL
  Filled 2012-10-02 (×6): qty 1

## 2012-10-02 MED ORDER — NITROGLYCERIN 2 % TD OINT
1.0000 [in_us] | TOPICAL_OINTMENT | Freq: Once | TRANSDERMAL | Status: AC
  Administered 2012-10-02: 1 [in_us] via TOPICAL
  Filled 2012-10-02: qty 1

## 2012-10-02 MED ORDER — ATORVASTATIN CALCIUM 20 MG PO TABS: 20.0000 mg | ORAL_TABLET | Freq: Every day | ORAL | Status: DC

## 2012-10-02 MED ORDER — ONDANSETRON HCL 4 MG/2ML IJ SOLN: 4.0000 mg | Freq: Four times a day (QID) | INTRAMUSCULAR | Status: DC | PRN

## 2012-10-02 MED ORDER — METOPROLOL SUCCINATE ER 50 MG PO TB24
50.0000 mg | ORAL_TABLET | Freq: Every day | ORAL | Status: DC
  Administered 2012-10-03 – 2012-10-07 (×5): 50 mg via ORAL
  Filled 2012-10-02 (×5): qty 1

## 2012-10-02 MED ORDER — OMEGA-3 FATTY ACIDS 1000 MG PO CAPS: 1.0000 | ORAL_CAPSULE | Freq: Two times a day (BID) | ORAL | Status: DC

## 2012-10-02 MED ORDER — ACETAMINOPHEN 650 MG RE SUPP: 650.0000 mg | Freq: Four times a day (QID) | RECTAL | Status: DC | PRN

## 2012-10-02 NOTE — ED Provider Notes (Signed)
History     CSN: [id number]  Arrival date & time 10/02/12  1742   First MD Initiated Contact with Patient 10/02/12 1930      Chief Complaint  Patient presents with  . Chest Pain    (Consider location/radiation/quality/duration/timing/severity/associated sxs/prior treatment) Patient is a 62 y.o. male presenting with chest pain. The history is provided by the patient. No language interpreter was used.  Chest Pain Pain location:  Substernal area Pain quality: aching, dull and sharp   Pain radiates to:  Does not radiate Pain radiates to the back: no   Pain severity:  Mild Onset quality:  Sudden Timing:  Constant Progression:  Unable to specify (Pain is not as sharp but more of a dull ache right now) Chronicity:  New Relieved by:  None tried Ineffective treatments:  None tried Associated symptoms: no abdominal pain, no back pain, no diaphoresis, no dizziness, no fever, no headache, no nausea and not vomiting   Pt with hx of 2 MIs presents to ED with sternal CP and pressure while playing golf.  Originally felt like muscle pull but pressure and discomfort has persisted.  Nothing makes the pain better or worse. Pt has not had nitro or ASA PTA. Pt last cardiology appointment was last year.  States this pain is similar to his 2nd MI.  Also c/o of some shortness of breath.  No n/v or sweats.    Past Medical History  Diagnosis Date  . ASCVD (arteriosclerotic cardiovascular disease)     LAD stent in 1999; RCA bare-metal stent in 12/2007, LAD stent patent, 50-60% proximal Cx lesion treated medically  . AAA (abdominal aortic aneurysm)     3.3 cm in 2012  . Hyperlipidemia   . Diabetes mellitus   . Thrombocytopenia     Chronic; evaluated by Dr. Mariel Sleet in 2007  . Tobacco abuse   . Hepatic steatosis   . ED (erectile dysfunction)   . Nephrolithiasis   . Anxiety   . Depression   . Insomnia   . Gastric ulcer   . Coronary artery disease   . MI (myocardial infarction)     Past Surgical  History  Procedure Laterality Date  . Coronary artery bypass graft    . Lithotripsy    . Cardiac surgery    . Inguinal hernia repair    . Coronary angioplasty with stent placement      1999, 2009  . Esophagogastroduodenoscopy  04/04/2012    Procedure: ESOPHAGOGASTRODUODENOSCOPY (EGD);  Surgeon: Malissa Hippo, MD;  Location: [facility];  Service: Endoscopy;  Laterality: N/A;  830  . Esophagogastroduodenoscopy  07/25/2012    Procedure: ESOPHAGOGASTRODUODENOSCOPY (EGD);  Surgeon: Malissa Hippo, MD;  Location: [facility];  Service: Endoscopy;  Laterality: N/A;  830    Family History  Problem Relation Age of Onset  . Ovarian cancer Sister     History  Substance Use Topics  . Smoking status: Current Every Day Smoker -- 0.50 packs/day for 40 years    Types: Cigarettes  . Smokeless tobacco: Not on file  . Alcohol Use: No      Review of Systems  Constitutional: Negative for fever and diaphoresis.  Cardiovascular: Positive for chest pain.  Gastrointestinal: Negative for nausea, vomiting and abdominal pain.  Musculoskeletal: Negative for back pain.  Neurological: Negative for dizziness and headaches.    Allergies  Meperidine hcl and Promethazine hcl  Home Medications   Current Outpatient Rx  Name  Route  Sig  Dispense  Refill  . ALPRAZolam (XANAX) 0.5 MG tablet   Oral   Take 0.5 mg by mouth at bedtime as needed. Sleep         . amLODipine (NORVASC) 2.5 MG tablet   Oral   Take 2.5 mg by mouth daily.           Marland Kitchen aspirin EC 81 MG tablet   Oral   Take 81 mg by mouth daily.         Marland Kitchen atorvastatin (LIPITOR) 20 MG tablet   Oral   Take 20 mg by mouth daily.         . fish oil-omega-3 fatty acids 1000 MG capsule   Oral   Take 1 capsule by mouth 2 (two) times daily.           . Linagliptin-Metformin HCl (JENTADUETO) 2.5-500 MG TABS   Oral   Take 2 tablets by mouth 2 (two) times daily.          . metoprolol succinate (TOPROL-XL) 50 MG 24 hr tablet    Oral   Take 50 mg by mouth daily. Take with or immediately following a meal.         . pantoprazole (PROTONIX) 20 MG tablet   Oral   Take 20 mg by mouth daily.          . Potassium Citrate (UROCIT-K 15) 15 MEQ (1620 MG) TBCR   Oral   Take 1 tablet by mouth 2 (two) times daily.         . ramipril (ALTACE) 10 MG tablet   Oral   Take 10 mg by mouth daily.           . tadalafil (CIALIS) 10 MG tablet   Oral   Take 10 mg by mouth daily as needed.         . zolpidem (AMBIEN) 10 MG tablet   Oral   Take 10 mg by mouth at bedtime as needed. Sleep           BP 139/75  Pulse 75  Temp(Src) 98.1 F (36.7 C) (Oral)  Resp 15  Ht 5\' 11"  (1.803 m)  Wt 165 lb (74.844 kg)  BMI 23.02 kg/m2  SpO2 98%  Physical Exam  Nursing note and vitals reviewed. Constitutional: He appears well-developed and well-nourished. No distress.  HENT:  Head: Normocephalic and atraumatic.  Eyes: Conjunctivae are normal.  Neck: Normal range of motion. Neck supple. No JVD present. No tracheal deviation present.  Cardiovascular: Normal rate, regular rhythm and normal heart sounds.   Pulmonary/Chest: Effort normal and breath sounds normal. No respiratory distress. He has no wheezes. He has no rales. He exhibits no tenderness.  Abdominal: Soft. Bowel sounds are normal. He exhibits no distension. There is no tenderness.  Musculoskeletal: Normal range of motion.  Neurological: He is alert.  Skin: Skin is warm and dry. He is not diaphoretic.    ED Course  Procedures (including critical care time)  Labs Reviewed  CBC - Abnormal; Notable for the following:    Platelets 124 (*)    All other components within normal limits  POCT I-STAT, CHEM 8 - Abnormal; Notable for the following:    BUN 26 (*)    Glucose, Bld 126 (*)    All other components within normal limits  POCT I-STAT TROPONIN I   Dg Chest 2 View  10/02/2012  *RADIOLOGY REPORT*  Clinical Data: Hypertension and diabetes.  Chest pain  CHEST -  2 VIEW  Comparison: Chest x-ray 04/12/2008.  Findings: Normal heart size with clear lung fields.  No bony abnormality.  IMPRESSION: No active cardiopulmonary disease.  Similar appearance to priors.   Original Report Authenticated By: Davonna Belling, M.D.      1. Chest pain   2. Hypertension   3. Type II or unspecified type diabetes mellitus without mention of complication, not stated as uncontrolled       MDM  Pt with hx of 2 MIs.  Last was in 2009.  Pt last cardiologist appointment was last year.  C/o sharp cp while playing golf.  Felt like a muscle pull but still continues to feel discomfort.  Pt has not had anything for pain.  Did not take nitro, stating he is out.  Pain is not reproducible.  Pain does not radiate.  Denies fever, chills, n/v, or sweats.    POC-troponin: 0.0  Will give pt ASA and nitro paste.   Discussed pt with Dr. Ranae Palms.  Will be admitting pt do to cardiac hx.   Vitals: unremarkable. Admitted to medicine floor in stable condition.    Discussed pt with attending during ED encounter.         Junius Finner, PA-C 10/02/12 2254

## 2012-10-02 NOTE — ED Provider Notes (Signed)
Medical screening examination/treatment/procedure(s) were conducted as a shared visit with non-physician practitioner(s) and myself.  I personally evaluated the patient during the encounter  Given risk factors and persistent mild pain will admit for further cardiac workup  Loren Racer, MD 10/02/12 2357

## 2012-10-02 NOTE — ED Notes (Signed)
Pt w/hx of 2 MI's to ED c/o sternal chest pain/pressure while playing golf.  Originally felt like pulled muscle, but pain has not left and pt continues to feel chest tightness.  Denies nausea, is non diaphoretic but c/o sob.  No nitro or asa taken.

## 2012-10-03 ENCOUNTER — Encounter (HOSPITAL_COMMUNITY)
Admission: EM | Disposition: A | Discharge status: Home / Self Care | Payer: Self-pay | Source: Home / Self Care | Attending: Thoracic Surgery (Cardiothoracic Vascular Surgery)

## 2012-10-03 ENCOUNTER — Other Ambulatory Visit: Payer: Self-pay

## 2012-10-03 ENCOUNTER — Encounter (HOSPITAL_COMMUNITY): Payer: Self-pay | Admitting: *Deleted

## 2012-10-03 DIAGNOSIS — I200 Unstable angina: Secondary | ICD-10-CM

## 2012-10-03 DIAGNOSIS — I2510 Atherosclerotic heart disease of native coronary artery without angina pectoris: Secondary | ICD-10-CM

## 2012-10-03 DIAGNOSIS — D696 Thrombocytopenia, unspecified: Secondary | ICD-10-CM

## 2012-10-03 HISTORY — PX: LEFT HEART CATHETERIZATION WITH CORONARY ANGIOGRAM: SHX5451

## 2012-10-03 LAB — GLUCOSE, CAPILLARY
Glucose-Capillary: 103 mg/dL — ABNORMAL HIGH (ref 70–99)
Glucose-Capillary: 111 mg/dL — ABNORMAL HIGH (ref 70–99)
Glucose-Capillary: 154 mg/dL — ABNORMAL HIGH (ref 70–99)
Glucose-Capillary: 96 mg/dL (ref 70–99)

## 2012-10-03 LAB — CBC
HCT: 40.3 % (ref 39.0–52.0)
HCT: 39.9 % (ref 39.0–52.0)
Hemoglobin: 14 g/dL (ref 13.0–17.0)
MCH: 29.1 pg (ref 26.0–34.0)
MCHC: 34.7 g/dL (ref 30.0–36.0)
MCV: 85.4 fL (ref 78.0–100.0)
Platelets: 109 10*3/uL — ABNORMAL LOW (ref 150–400)
RBC: 4.81 MIL/uL (ref 4.22–5.81)
RBC: 4.67 MIL/uL (ref 4.22–5.81)
RDW: 14 % (ref 11.5–15.5)
WBC: 7.6 10*3/uL (ref 4.0–10.5)

## 2012-10-03 LAB — BASIC METABOLIC PANEL
CO2: 27 mEq/L (ref 19–32)
Chloride: 105 mEq/L (ref 96–112)
GFR calc Af Amer: 80 mL/min — ABNORMAL LOW (ref >90)
Potassium: 3.5 mEq/L (ref 3.5–5.1)
Sodium: 140 mEq/L (ref 135–145)

## 2012-10-03 LAB — MRSA PCR SCREENING: MRSA by PCR: NEGATIVE

## 2012-10-03 LAB — TROPONIN I
Troponin I: <0.3 ng/mL (ref <0.30)
Troponin I: <0.3 ng/mL (ref <0.30)

## 2012-10-03 LAB — CREATININE, SERUM
Creatinine, Ser: 1.11 mg/dL (ref 0.50–1.35)
GFR calc non Af Amer: 71 mL/min — ABNORMAL LOW (ref >90)

## 2012-10-03 LAB — HEPARIN LEVEL (UNFRACTIONATED): Heparin Unfractionated: 0.18 IU/mL — ABNORMAL LOW (ref 0.30–0.70)

## 2012-10-03 SURGERY — LEFT HEART CATHETERIZATION WITH CORONARY ANGIOGRAM
Anesthesia: LOCAL

## 2012-10-03 MED ORDER — SODIUM CHLORIDE 0.9 % IV SOLN: 250.0000 mL | INTRAVENOUS | Status: DC | PRN

## 2012-10-03 MED ORDER — SODIUM CHLORIDE 0.9 % IJ SOLN: 3.0000 mL | INTRAMUSCULAR | Status: DC | PRN

## 2012-10-03 MED ORDER — HEPARIN (PORCINE) IN NACL 100-0.45 UNIT/ML-% IJ SOLN
1300.0000 [IU]/h | INTRAMUSCULAR | Status: DC
  Administered 2012-10-04: 1400 [IU]/h via INTRAVENOUS
  Administered 2012-10-04: 1100 [IU]/h via INTRAVENOUS
  Administered 2012-10-05: 1400 [IU]/h via INTRAVENOUS
  Administered 2012-10-06: 1300 [IU]/h via INTRAVENOUS
  Filled 2012-10-03 (×8): qty 250

## 2012-10-03 MED ORDER — VERAPAMIL HCL 2.5 MG/ML IV SOLN
INTRAVENOUS | Status: AC
  Filled 2012-10-03: qty 2

## 2012-10-03 MED ORDER — HEPARIN BOLUS VIA INFUSION
4000.0000 [IU] | Freq: Once | INTRAVENOUS | Status: AC
  Administered 2012-10-03: 4000 [IU] via INTRAVENOUS
  Filled 2012-10-03: qty 4000

## 2012-10-03 MED ORDER — DIAZEPAM 5 MG PO TABS
5.0000 mg | ORAL_TABLET | ORAL | Status: AC
  Administered 2012-10-03: 5 mg via ORAL
  Filled 2012-10-03: qty 1

## 2012-10-03 MED ORDER — HEPARIN (PORCINE) IN NACL 2-0.9 UNIT/ML-% IJ SOLN
INTRAMUSCULAR | Status: AC
  Filled 2012-10-03: qty 1000

## 2012-10-03 MED ORDER — POTASSIUM CHLORIDE CRYS ER 20 MEQ PO TBCR
40.0000 meq | EXTENDED_RELEASE_TABLET | Freq: Once | ORAL | Status: AC
  Administered 2012-10-03: 40 meq via ORAL
  Filled 2012-10-03: qty 2

## 2012-10-03 MED ORDER — LIDOCAINE HCL (PF) 1 % IJ SOLN
INTRAMUSCULAR | Status: AC
  Filled 2012-10-03: qty 30

## 2012-10-03 MED ORDER — ASPIRIN 81 MG PO CHEW
324.0000 mg | CHEWABLE_TABLET | ORAL | Status: AC
  Administered 2012-10-03: 324 mg via ORAL
  Filled 2012-10-03: qty 4

## 2012-10-03 MED ORDER — SODIUM CHLORIDE 0.9 % IV SOLN: INTRAVENOUS | Status: DC

## 2012-10-03 MED ORDER — SODIUM CHLORIDE 0.9 % IJ SOLN
3.0000 mL | Freq: Two times a day (BID) | INTRAMUSCULAR | Status: DC
  Administered 2012-10-03: 3 mL via INTRAVENOUS

## 2012-10-03 MED ORDER — HEPARIN (PORCINE) IN NACL 100-0.45 UNIT/ML-% IJ SOLN
900.0000 [IU]/h | INTRAMUSCULAR | Status: DC
  Administered 2012-10-03: 900 [IU]/h via INTRAVENOUS
  Filled 2012-10-03: qty 250

## 2012-10-03 MED ORDER — SODIUM CHLORIDE 0.9 % IJ SOLN: 3.0000 mL | Freq: Two times a day (BID) | INTRAMUSCULAR | Status: DC

## 2012-10-03 MED ORDER — MIDAZOLAM HCL 2 MG/2ML IJ SOLN
INTRAMUSCULAR | Status: AC
  Filled 2012-10-03: qty 2

## 2012-10-03 MED ORDER — HEPARIN (PORCINE) IN NACL 100-0.45 UNIT/ML-% IJ SOLN
1100.0000 [IU]/h | INTRAMUSCULAR | Status: DC
  Filled 2012-10-03: qty 250

## 2012-10-03 MED ORDER — HEPARIN BOLUS VIA INFUSION
2000.0000 [IU] | Freq: Once | INTRAVENOUS | Status: AC
  Administered 2012-10-03: 2000 [IU] via INTRAVENOUS
  Filled 2012-10-03: qty 2000

## 2012-10-03 MED ORDER — FENTANYL CITRATE 0.05 MG/ML IJ SOLN
INTRAMUSCULAR | Status: AC
  Filled 2012-10-03: qty 2

## 2012-10-03 MED ORDER — ATORVASTATIN CALCIUM 80 MG PO TABS
80.0000 mg | ORAL_TABLET | Freq: Every day | ORAL | Status: DC
  Administered 2012-10-03 – 2012-10-06 (×5): 80 mg via ORAL
  Filled 2012-10-03 (×8): qty 1

## 2012-10-03 NOTE — H&P (Signed)
[facility] History and Physical  [name] JYN:[id number] DOB: [birth year] DOA: 10/02/2012  Referring physician: ER physician. PCP: Colette Ribas, MD  Specialists: Dr. Jiles Crocker cardiologist.  Chief Complaint: Chest pain.  HPI: [name] is a 62 y.o. male history of CAD status post stenting started experiencing chest pain while playing golf. Chest pain lasted for around 15-20 minutes and improved. On the way back home patient started developing chest pain again and came to the ER. His chest pain improved with nitroglycerin but still has mild persistent chest pain. Cardiac enzymes and EKG chest x-ray were unremarkable. Patient at this time has been admitted for further management. Cardiologist on call for [place] was consulted. Patient denies any shortness of breath palpitation dizziness or diaphoresis.  Review of Systems: As presented in the history of presenting illness, rest negative.  Past Medical History  Diagnosis Date  . ASCVD (arteriosclerotic cardiovascular disease)     LAD stent in 1999; RCA bare-metal stent in 12/2007, LAD stent patent, 50-60% proximal Cx lesion treated medically  . AAA (abdominal aortic aneurysm)     3.3 cm in 2012  . Hyperlipidemia   . Diabetes mellitus   . Thrombocytopenia     Chronic; evaluated by Dr. Mariel Sleet in 2007  . Tobacco abuse   . Hepatic steatosis   . ED (erectile dysfunction)   . Nephrolithiasis   . Anxiety   . Depression   . Insomnia   . Gastric ulcer   . Coronary artery disease   . MI (myocardial infarction)    Past Surgical History  Procedure Laterality Date  . Coronary artery bypass graft    . Lithotripsy    . Cardiac surgery    . Inguinal hernia repair    . Coronary angioplasty with stent placement      1999, 2009  . Esophagogastroduodenoscopy  04/04/2012    Procedure: ESOPHAGOGASTRODUODENOSCOPY (EGD);  Surgeon: Malissa Hippo, MD;  Location: [facility];  Service: Endoscopy;  Laterality: N/A;  830   . Esophagogastroduodenoscopy  07/25/2012    Procedure: ESOPHAGOGASTRODUODENOSCOPY (EGD);  Surgeon: Malissa Hippo, MD;  Location: [facility];  Service: Endoscopy;  Laterality: N/A;  830   Social History:  reports that he has been smoking Cigarettes.  He has a 20 pack-year smoking history. He does not have any smokeless tobacco history on file. He reports that he does not drink alcohol or use illicit drugs. Lives at home. where does patient live-- Can do ADLs. Can patient participate in ADLs?  Allergies  Allergen Reactions  . Meperidine Hcl Other (See Comments)    Was mixed with phenergan and turned blood vessels red.   . Promethazine Hcl Other (See Comments)    Was mixed with the Demerol and caused veins to turn red.     Family History  Problem Relation Age of Onset  . Ovarian cancer Sister       Prior to Admission medications   Medication Sig Start Date End Date Taking? Authorizing Provider  ALPRAZolam Prudy Feeler) 0.5 MG tablet Take 0.5 mg by mouth at bedtime as needed. Sleep   Yes Historical Provider, MD  amLODipine (NORVASC) 2.5 MG tablet Take 2.5 mg by mouth daily.     Yes Historical Provider, MD  aspirin EC 81 MG tablet Take 81 mg by mouth daily.   Yes Historical Provider, MD  atorvastatin (LIPITOR) 20 MG tablet Take 20 mg by mouth daily.   Yes Historical Provider, MD  fish oil-omega-3 fatty acids  1000 MG capsule Take 1 capsule by mouth 2 (two) times daily.     Yes Historical Provider, MD  Linagliptin-Metformin HCl (JENTADUETO) 2.5-500 MG TABS Take 2 tablets by mouth 2 (two) times daily.    Yes Historical Provider, MD  metoprolol succinate (TOPROL-XL) 50 MG 24 hr tablet Take 50 mg by mouth daily. Take with or immediately following a meal.   Yes Historical Provider, MD  pantoprazole (PROTONIX) 20 MG tablet Take 20 mg by mouth daily.  03/25/12  Yes Ward Givens, MD  Potassium Citrate (UROCIT-K 15) 15 MEQ (1620 MG) TBCR Take 1 tablet by mouth 2 (two) times daily.   Yes Historical  Provider, MD  ramipril (ALTACE) 10 MG tablet Take 10 mg by mouth daily.     Yes Historical Provider, MD  tadalafil (CIALIS) 10 MG tablet Take 10 mg by mouth daily as needed.   Yes Historical Provider, MD  zolpidem (AMBIEN) 10 MG tablet Take 10 mg by mouth at bedtime as needed. Sleep   Yes Historical Provider, MD   Physical Exam: Filed Vitals:   10/02/12 2100 10/02/12 2200 10/02/12 2250 10/03/12 0000  BP: 131/72 139/75 126/71 118/62  Pulse: 73 75 77 75  Temp:   98 F (36.7 C) 97.9 F (36.6 C)  TempSrc:   Oral Oral  Resp: 18 15 16 15   Height:   5\' 11"  (1.803 m)   Weight:   75.3 kg (166 lb 0.1 oz)   SpO2: 96% 98% 96% 94%     General:  Well-developed well-nourished.  Eyes: Anicteric no pallor.  ENT: No discharge from the ears eyes nose and mouth.  Neck: No JVD.  Cardiovascular: S1-S2 heard.  Respiratory: No rhonchi or crepitations.  Abdomen: Soft nontender bowel sounds present.  Skin: No rash.  Musculoskeletal: No edema.  Psychiatric: Appears normal.  Neurologic: Alert awake oriented to time place and person. Moves all extremities.  Labs on Admission:  Basic Metabolic Panel:  Recent Labs Lab 10/02/12 1824 10/02/12 2304  NA 142  --   K 3.9  --   CL 107  --   GLUCOSE 126*  --   BUN 26*  --   CREATININE 1.30 1.11   Liver Function Tests: No results found for this basename: AST, ALT, ALKPHOS, BILITOT, PROT, ALBUMIN,  in the last 168 hours No results found for this basename: LIPASE, AMYLASE,  in the last 168 hours No results found for this basename: AMMONIA,  in the last 168 hours CBC:  Recent Labs Lab 10/02/12 1813 10/02/12 1824 10/02/12 2304  WBC 9.2  --  9.1  HGB 14.9 15.0 14.0  HCT 42.7 44.0 40.3  MCV 84.9  --  83.8  PLT 124*  --  118*   Cardiac Enzymes:  Recent Labs Lab 10/02/12 2304  TROPONINI <0.30    BNP (last 3 results) No results found for this basename: PROBNP,  in the last 8760 hours CBG:  Recent Labs Lab 10/03/12 0018  GLUCAP  154*    Radiological Exams on Admission: Dg Chest 2 View  10/02/2012  *RADIOLOGY REPORT*  Clinical Data: Hypertension and diabetes.  Chest pain  CHEST - 2 VIEW  Comparison: Chest x-ray 04/12/2008.  Findings: Normal heart size with clear lung fields.  No bony abnormality.  IMPRESSION: No active cardiopulmonary disease.  Similar appearance to priors.   Original Report Authenticated By: Davonna Belling, M.D.     EKG: Independently reviewed. Normal sinus rhythm.  Assessment/Plan Principal Problem:   Chest pain Active  Problems:   DIABETES MELLITUS, TYPE II   HYPERLIPIDEMIA   Hypertension   1. Chest pain with history of CAD status post stenting concerning for unstable angina -  Cycle cardiac markers. Aspirin. Nitroglycerin patch. Patient states he had Cialis more than a week ago. Cardiology consult. Patient will be kept n.p.o. in anticipation of possible procedure. 2. Diabetes mellitus type 2 - place patient on sliding-scale insulin. 3. Hyperlipidemia - continue present medications.  4. Hypertension - continue present medications.    Code Status: Full code.  Family Communication: None.  Disposition Plan: Admit to inpatient.    Parvin Stetzer N. [facility] Pager [phone].  If 7PM-7AM, please contact night-coverage [email or web address] Password [email or web address] 10/03/2012, 2:08 AM

## 2012-10-03 NOTE — Consult Note (Signed)
Reason for Consult: chest pain/unstable angina Referring Physician: Dr. Dwana Melena [name] is an 61 y.o. male.  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 He first noticed the chest discomfort/pressure while swinging and lasted 2-3 minutes. He noticed the pressure sensation come on off/on throughout the afternoon but it persisted a bit during his drive home leading to presentation here. He has not had this pain since his last LHC in 2009. The pain is similar but not quite as severe - less heavy. The pain is left sided, no real associated symptoms of nausea/left arm/neck/shoulder pain/numbness. Otherwise, he walks frequent without no exertional limitations, no syncope. He is compliant with his medications and daily aspirin but continues to smoke 1/2 ppd despite our teaching/education.   Past Medical History  Diagnosis Date  . ASCVD (arteriosclerotic cardiovascular disease)     LAD stent in 1999; RCA bare-metal stent in 12/2007, LAD stent patent, 50-60% proximal Cx lesion treated medically  . AAA (abdominal aortic aneurysm)     3.3 cm in 2012  . Hyperlipidemia   . Diabetes mellitus   . Thrombocytopenia     Chronic; evaluated by Dr. Mariel Sleet in 2007  . Tobacco abuse   . Hepatic steatosis   . ED (erectile dysfunction)   . Nephrolithiasis   . Anxiety   . Depression   . Insomnia   . Gastric ulcer   . Coronary artery disease   . MI (myocardial infarction)     Past Surgical History  Procedure Laterality Date  . Coronary artery bypass graft    . Lithotripsy    . Cardiac surgery    . Inguinal hernia repair    . Coronary angioplasty with stent placement      1999, 2009  . Esophagogastroduodenoscopy  04/04/2012    Procedure: ESOPHAGOGASTRODUODENOSCOPY (EGD);  Surgeon: Malissa Hippo, MD;  Location: [facility];   Service: Endoscopy;  Laterality: N/A;  830  . Esophagogastroduodenoscopy  07/25/2012    Procedure: ESOPHAGOGASTRODUODENOSCOPY (EGD);  Surgeon: Malissa Hippo, MD;  Location: [facility];  Service: Endoscopy;  Laterality: N/A;  830    Family History  Problem Relation Age of Onset  . Ovarian cancer Sister     Social History:  reports that he has been smoking Cigarettes.  He has a 20 pack-year smoking history. He does not have any smokeless tobacco history on file. He reports that he does not drink alcohol or use illicit drugs.  Allergies:  Allergies  Allergen Reactions  . Meperidine Hcl Other (See Comments)    Was mixed with phenergan and turned blood vessels red.   . Promethazine Hcl Other (See Comments)    Was mixed with the Demerol and caused veins to turn red.      Medications:  I have reviewed the patient's current medications. Prior to Admission:  Prescriptions prior to admission  Medication Sig Dispense Refill  . ALPRAZolam (XANAX) 0.5 MG tablet Take 0.5 mg by mouth at bedtime as needed. Sleep      . amLODipine (NORVASC) 2.5 MG tablet Take 2.5 mg by mouth daily.        Marland Kitchen aspirin EC 81 MG tablet Take 81 mg by mouth daily.      Marland Kitchen atorvastatin (LIPITOR) 20 MG tablet Take 20 mg by mouth daily.      Marland Kitchen  fish oil-omega-3 fatty acids 1000 MG capsule Take 1 capsule by mouth 2 (two) times daily.        . Linagliptin-Metformin HCl (JENTADUETO) 2.5-500 MG TABS Take 2 tablets by mouth 2 (two) times daily.       . metoprolol succinate (TOPROL-XL) 50 MG 24 hr tablet Take 50 mg by mouth daily. Take with or immediately following a meal.      . pantoprazole (PROTONIX) 20 MG tablet Take 20 mg by mouth daily.       . Potassium Citrate (UROCIT-K 15) 15 MEQ (1620 MG) TBCR Take 1 tablet by mouth 2 (two) times daily.      . ramipril (ALTACE) 10 MG tablet Take 10 mg by mouth daily.        . tadalafil (CIALIS) 10 MG tablet Take 10 mg by mouth daily as needed.      . zolpidem (AMBIEN) 10 MG tablet  Take 10 mg by mouth at bedtime as needed. Sleep       Scheduled: . sodium chloride   Intravenous STAT  . amLODipine  2.5 mg Oral Daily  . aspirin EC  325 mg Oral Daily  . atorvastatin  20 mg Oral q1800  . insulin aspart  0-9 Units Subcutaneous TID WC  . metoprolol succinate  50 mg Oral Daily  . nitroGLYCERIN  0.4 mg Transdermal Daily  . omega-3 acid ethyl esters  1 g Oral BID  . pantoprazole  20 mg Oral Daily  . potassium citrate  10 mEq Oral BID  . ramipril  10 mg Oral Daily  . sodium chloride  3 mL Intravenous Q12H    Results for orders placed during the hospital encounter of 10/02/12 (from the past 48 hour(s))  CBC     Status: Abnormal   Collection Time    10/02/12  6:13 PM      Result Value Range   WBC 9.2  4.0 - 10.5 K/uL   RBC 5.03  4.22 - 5.81 MIL/uL   Hemoglobin 14.9  13.0 - 17.0 g/dL   HCT 24.4  01.0 - 27.2 %   MCV 84.9  78.0 - 100.0 fL   MCH 29.6  26.0 - 34.0 pg   MCHC 34.9  30.0 - 36.0 g/dL   RDW 53.6  64.4 - 03.4 %   Platelets 124 (*) 150 - 400 K/uL  POCT I-STAT TROPONIN I     Status: None   Collection Time    10/02/12  6:21 PM      Result Value Range   Troponin i, poc 0.00  0.00 - 0.08 ng/mL   Comment 3            Comment: Due to the release kinetics of cTnI,     a negative result within the first hours     of the onset of symptoms does not rule out     myocardial infarction with certainty.     If myocardial infarction is still suspected,     repeat the test at appropriate intervals.  POCT I-STAT, CHEM 8     Status: Abnormal   Collection Time    10/02/12  6:24 PM      Result Value Range   Sodium 142  135 - 145 mEq/L   Potassium 3.9  3.5 - 5.1 mEq/L   Chloride 107  96 - 112 mEq/L   BUN 26 (*) 6 - 23 mg/dL   Creatinine, Ser 7.42  0.50 - 1.35 mg/dL  Glucose, Bld 126 (*) 70 - 99 mg/dL   Calcium, Ion 4.54  1.12 - 1.23 mmol/L   TCO2 24  0 - 100 mmol/L   Hemoglobin 15.0  13.0 - 17.0 g/dL   HCT 09.8  11.9 - 14.7 %  TROPONIN I     Status: None    Collection Time    10/02/12 11:04 PM      Result Value Range   Troponin I <0.30  <0.30 ng/mL   Comment:            Due to the release kinetics of cTnI,     a negative result within the first hours     of the onset of symptoms does not rule out     myocardial infarction with certainty.     If myocardial infarction is still suspected,     repeat the test at appropriate intervals.  CBC     Status: Abnormal   Collection Time    10/02/12 11:04 PM      Result Value Range   WBC 9.1  4.0 - 10.5 K/uL   RBC 4.81  4.22 - 5.81 MIL/uL   Hemoglobin 14.0  13.0 - 17.0 g/dL   HCT 82.9  56.2 - 13.0 %   MCV 83.8  78.0 - 100.0 fL   MCH 29.1  26.0 - 34.0 pg   MCHC 34.7  30.0 - 36.0 g/dL   RDW 86.5  78.4 - 69.6 %   Platelets 118 (*) 150 - 400 K/uL   Comment: PLATELET COUNT CONFIRMED BY SMEAR  CREATININE, SERUM     Status: Abnormal   Collection Time    10/02/12 11:04 PM      Result Value Range   Creatinine, Ser 1.11  0.50 - 1.35 mg/dL   GFR calc non Af Amer 71 (*) >90 mL/min   GFR calc Af Amer 82 (*) >90 mL/min   Comment:            The eGFR has been calculated     using the CKD EPI equation.     This calculation has not been     validated in all clinical     situations.     eGFR's persistently     <90 mL/min signify     possible Chronic Kidney Disease.  GLUCOSE, CAPILLARY     Status: Abnormal   Collection Time    10/03/12 12:18 AM      Result Value Range   Glucose-Capillary 154 (*) 70 - 99 mg/dL   Comment 1 Documented in Chart     Comment 2 Notify RN      Dg Chest 2 View  10/02/2012  *RADIOLOGY REPORT*  Clinical Data: Hypertension and diabetes.  Chest pain  CHEST - 2 VIEW  Comparison: Chest x-ray 04/12/2008.  Findings: Normal heart size with clear lung fields.  No bony abnormality.  IMPRESSION: No active cardiopulmonary disease.  Similar appearance to priors.   Original Report Authenticated By: Davonna Belling, M.D.     Review of Systems  Constitutional: Negative for fever, chills and  weight loss.  HENT: Negative for hearing loss, neck pain and tinnitus.   Eyes: Negative for blurred vision, double vision and photophobia.  Respiratory: Negative for cough, hemoptysis, sputum production and shortness of breath.   Cardiovascular: Positive for chest pain. Negative for palpitations, orthopnea, claudication and leg swelling.  Gastrointestinal: Negative for heartburn, nausea, vomiting and abdominal pain.  Musculoskeletal: Negative for myalgias and back pain.  Skin:  Negative for itching and rash.  Neurological: Negative for dizziness, tingling, tremors and headaches.  Endo/Heme/Allergies: Negative for environmental allergies. Does not bruise/bleed easily.  Psychiatric/Behavioral: Negative for depression, suicidal ideas and substance abuse.   Blood pressure 118/62, pulse 75, temperature 97.9 F (36.6 C), temperature source Oral, resp. rate 15, height 5\' 11"  (1.803 m), weight 75.3 kg (166 lb 0.1 oz), SpO2 94.00%. Physical Exam  Nursing note and vitals reviewed. Constitutional: He is oriented to person, place, and time. He appears well-developed and well-nourished. No distress.  HENT:  Head: Normocephalic and atraumatic.  Nose: Nose normal.  Mouth/Throat: Oropharynx is clear and moist. No oropharyngeal exudate.  Eyes: Conjunctivae and EOM are normal. Pupils are equal, round, and reactive to light. No scleral icterus.  Neck: Normal range of motion. Neck supple. No JVD present. No tracheal deviation present. No thyromegaly present.  Cardiovascular: Normal rate, regular rhythm and intact distal pulses.  Exam reveals no gallop.   No murmur heard. Respiratory: Effort normal. No respiratory distress. He has no wheezes. He has rales.  Bibasilar rales that clear superiorly  GI: Soft. Bowel sounds are normal. He exhibits no distension. There is no tenderness. There is no rebound.  Musculoskeletal: Normal range of motion. He exhibits no edema and no tenderness.  Neurological: He is alert  and oriented to person, place, and time. No cranial nerve deficit.  Skin: Skin is warm and dry. No rash noted. He is not diaphoretic. No erythema.  Psychiatric: He has a normal mood and affect. His behavior is normal.  ECG reviewed and NSR Labs reviewed; plt 124, h/h 14/43, na 142, K 3.9, bun/cr 26/1.3, glucose 126 Chest x-ray unrevealing  Problem List Chest Pain Unstable angina Known Coronary Artery Disease with prior stents Hypertension Dyslipidemia AAA following last known 3.2 cm Tobacco abuse Renal stones - recurrent  Assessment/Plan: 62 yo man with PMH of tobacco use, hypertension, dyslipidemia, known coronary artery disease with 2 prior stents most recently 2009 who has been chest pain free until today. He has had stuttering chest pain throughout the day associated with exertion. Given new pain similar in character to prior MI, would lean towards left heart catheterization in AM.  - heparin gtt per pharmacy for UA/ACS - already received aspirin - NPO after MN for likely LHC in AM, medications ok - smoking cessation counseling provided and more motivation needed - defer P2Y12 to time of catheterization - continue aspirin 81 mg, increase atorva to 80 mg, continue toprol XL 50 mg and ramipril 10 mg  Karver Fadden 10/03/2012, 1:39 AM

## 2012-10-03 NOTE — Interval H&P Note (Signed)
History and Physical Interval Note:  10/03/2012 3:24 PM  [name]  has presented today for surgery, with the diagnosis of cp  The various methods of treatment have been discussed with the patient and family. After consideration of risks, benefits and other options for treatment, the patient has consented to  Procedure(s): LEFT HEART CATHETERIZATION WITH CORONARY ANGIOGRAM (N/A) as a surgical intervention .  The patient's history has been reviewed, patient examined, no change in status, stable for surgery.  I have reviewed the patient's chart and labs.  Questions were answered to the patient's satisfaction.     Rollene Rotunda

## 2012-10-03 NOTE — Progress Notes (Signed)
ANTICOAGULATION CONSULT NOTE - Follow Up Consult  Pharmacy Consult for heparin Indication: chest pain/ACS  Allergies  Allergen Reactions  . Meperidine Hcl Other (See Comments)    Was mixed with phenergan and turned blood vessels red.   . Promethazine Hcl Other (See Comments)    Was mixed with the Demerol and caused veins to turn red.     Patient Measurements: Height: 5\' 11"  (180.3 cm) Weight: 166 lb 0.1 oz (75.3 kg) IBW/kg (Calculated) : 75.3  Vital Signs: Temp: 97.5 F (36.4 C) (04/04 0728) Temp src: Oral (04/04 0728) BP: 117/68 mmHg (04/04 0726) Pulse Rate: 65 (04/04 1504)  Labs:  Recent Labs  10/02/12 1813 10/02/12 1824 10/02/12 2304 10/03/12 0440 10/03/12 0819 10/03/12 1100  HGB 14.9 15.0 14.0 13.3  --   --   HCT 42.7 44.0 40.3 39.9  --   --   PLT 124*  --  118* 109*  --   --   LABPROT  --   --   --  14.3  --   --   INR  --   --   --  1.13  --   --   HEPARINUNFRC  --   --   --   --  0.18*  --   CREATININE  --  1.30 1.11 1.14  --   --   TROPONINI  --   --  <0.30 <0.30  --  <0.30    Estimated Creatinine Clearance: 74.3 ml/min (by C-G formula based on Cr of 1.14).  Assessment: Mr. Leasure presented with chest pain, he is now s/p LHC which revealed severe 2-vessel disease. Received orders to start heparin 8h post sheath removal pending CABG assessment. Sheath was pulled at 1650. Hgb 13.3 and plt= 109 (plt= 120 02/27/12). Patient is currently stable per RN.  Goal of Therapy:  Heparin level 0.3-0.7 units/ml Monitor platelets by anticoagulation protocol: Yes   Plan:  - At 0100 on 4/5, restart heparin drip at 1100 units/hr - check heparin level at 0700 tomorrow, then will f/up Daily heparin level and CBC  Thanks, Linkin Vizzini K. Allena Katz, PharmD, BCPS.  Clinical Pharmacist Pager [phone]. 10/03/2012 5:00 PM

## 2012-10-03 NOTE — H&P (View-Only) (Signed)
    SUBJECTIVE: Chest pain resolved.   BP 110/66  Pulse 66  Temp(Src) 97.5 F (36.4 C) (Oral)  Resp 14  Ht 5\' 11"  (1.803 m)  Wt 166 lb 0.1 oz (75.3 kg)  BMI 23.16 kg/m2  SpO2 94%  Intake/Output Summary (Last 24 hours) at 10/03/12 0741 Last data filed at 10/03/12 4782  Gross per 24 hour  Intake    576 ml  Output    650 ml  Net    -74 ml    PHYSICAL EXAM General: Well developed, well nourished, in no acute distress. Alert and oriented x 3.  Psych:  Good affect, responds appropriately Neck: No JVD. No masses noted.  Lungs: Clear bilaterally with no wheezes or rhonci noted.  Heart: RRR with no murmurs noted. Abdomen: Bowel sounds are present. Soft, non-tender.  Extremities: No lower extremity edema.   LABS: Basic Metabolic Panel:  Recent Labs  95/62/13 1824 10/02/12 2304 10/03/12 0440  NA 142  --  140  K 3.9  --  3.5  CL 107  --  105  CO2  --   --  27  GLUCOSE 126*  --  90  BUN 26*  --  23  CREATININE 1.30 1.11 1.14  CALCIUM  --   --  9.1   CBC:  Recent Labs  10/02/12 2304 10/03/12 0440  WBC 9.1 7.6  HGB 14.0 13.3  HCT 40.3 39.9  MCV 83.8 85.4  PLT 118* 109*   Cardiac Enzymes:  Recent Labs  10/02/12 2304 10/03/12 0440  TROPONINI <0.30 <0.30    Current Meds: . sodium chloride   Intravenous STAT  . amLODipine  2.5 mg Oral Daily  . aspirin EC  325 mg Oral Daily  . atorvastatin  80 mg Oral q1800  . insulin aspart  0-9 Units Subcutaneous TID WC  . metoprolol succinate  50 mg Oral Daily  . nitroGLYCERIN  0.4 mg Transdermal Daily  . omega-3 acid ethyl esters  1 g Oral BID  . pantoprazole  20 mg Oral Daily  . potassium citrate  10 mEq Oral BID  . ramipril  10 mg Oral Daily  . sodium chloride  3 mL Intravenous Q12H     ASSESSMENT AND PLAN:  1. CAD/unstable angina: Admitted with chest pain concerning for unstable angina.  Cardiac markers negative. He is known to have multi-vessel disease with last cath 2009 with bare metal stent placed in  subtotally occluded RCA, prior LAD stents with moderate disease in the LAD and Circumflex in 2009. Platelets are at his baseline of 110,000 which is well documented in previous PCI note in 2009.  Will plan cath later today to exclude progression of CAD. Risks and benefits reviewed with pt.   [name]  4/4/20147:41 AM

## 2012-10-03 NOTE — Progress Notes (Signed)
Utilization review completed.  

## 2012-10-03 NOTE — Progress Notes (Signed)
    SUBJECTIVE: Chest pain resolved.   BP 110/66  Pulse 66  Temp(Src) 97.5 F (36.4 C) (Oral)  Resp 14  Ht 5' 11" (1.803 m)  Wt 166 lb 0.1 oz (75.3 kg)  BMI 23.16 kg/m2  SpO2 94%  Intake/Output Summary (Last 24 hours) at 10/03/12 0741 Last data filed at 10/03/12 0728  Gross per 24 hour  Intake    576 ml  Output    650 ml  Net    -74 ml    PHYSICAL EXAM General: Well developed, well nourished, in no acute distress. Alert and oriented x 3.  Psych:  Good affect, responds appropriately Neck: No JVD. No masses noted.  Lungs: Clear bilaterally with no wheezes or rhonci noted.  Heart: RRR with no murmurs noted. Abdomen: Bowel sounds are present. Soft, non-tender.  Extremities: No lower extremity edema.   LABS: Basic Metabolic Panel:  Recent Labs  10/02/12 1824 10/02/12 2304 10/03/12 0440  NA 142  --  140  K 3.9  --  3.5  CL 107  --  105  CO2  --   --  27  GLUCOSE 126*  --  90  BUN 26*  --  23  CREATININE 1.30 1.11 1.14  CALCIUM  --   --  9.1   CBC:  Recent Labs  10/02/12 2304 10/03/12 0440  WBC 9.1 7.6  HGB 14.0 13.3  HCT 40.3 39.9  MCV 83.8 85.4  PLT 118* 109*   Cardiac Enzymes:  Recent Labs  10/02/12 2304 10/03/12 0440  TROPONINI <0.30 <0.30    Current Meds: . sodium chloride   Intravenous STAT  . amLODipine  2.5 mg Oral Daily  . aspirin EC  325 mg Oral Daily  . atorvastatin  80 mg Oral q1800  . insulin aspart  0-9 Units Subcutaneous TID WC  . metoprolol succinate  50 mg Oral Daily  . nitroGLYCERIN  0.4 mg Transdermal Daily  . omega-3 acid ethyl esters  1 g Oral BID  . pantoprazole  20 mg Oral Daily  . potassium citrate  10 mEq Oral BID  . ramipril  10 mg Oral Daily  . sodium chloride  3 mL Intravenous Q12H     ASSESSMENT AND PLAN:  1. CAD/unstable angina: Admitted with chest pain concerning for unstable angina.  Cardiac markers negative. He is known to have multi-vessel disease with last cath 2009 with bare metal stent placed in  subtotally occluded RCA, prior LAD stents with moderate disease in the LAD and Circumflex in 2009. Platelets are at his baseline of 110,000 which is well documented in previous PCI note in 2009.  Will plan cath later today to exclude progression of CAD. Risks and benefits reviewed with pt.   MCALHANY,CHRISTOPHER  4/4/20147:41 AM  

## 2012-10-03 NOTE — Progress Notes (Signed)
ANTICOAGULATION CONSULT NOTE - Follow Up Consult  Pharmacy Consult for heparin Indication: chest pain/ACS  Allergies  Allergen Reactions  . Meperidine Hcl Other (See Comments)    Was mixed with phenergan and turned blood vessels red.   . Promethazine Hcl Other (See Comments)    Was mixed with the Demerol and caused veins to turn red.     Patient Measurements: Height: 5\' 11"  (180.3 cm) Weight: 166 lb 0.1 oz (75.3 kg) IBW/kg (Calculated) : 75.3  Vital Signs: Temp: 97.5 F (36.4 C) (04/04 0728) Temp src: Oral (04/04 0728) BP: 110/66 mmHg (04/04 0400) Pulse Rate: 66 (04/04 0400)  Labs:  Recent Labs  10/02/12 1813 10/02/12 1824 10/02/12 2304 10/03/12 0440 10/03/12 0819  HGB 14.9 15.0 14.0 13.3  --   HCT 42.7 44.0 40.3 39.9  --   PLT 124*  --  118* 109*  --   LABPROT  --   --   --  14.3  --   INR  --   --   --  1.13  --   HEPARINUNFRC  --   --   --   --  0.18*  CREATININE  --  1.30 1.11 1.14  --   TROPONINI  --   --  <0.30 <0.30  --     Estimated Creatinine Clearance: 74.3 ml/min (by C-G formula based on Cr of 1.14).   Medications:  Scheduled:  . [COMPLETED] sodium chloride   Intravenous STAT  . amLODipine  2.5 mg Oral Daily  . [COMPLETED] aspirin  324 mg Oral Once  . [COMPLETED] aspirin  324 mg Oral Pre-Cath  . aspirin EC  325 mg Oral Daily  . atorvastatin  80 mg Oral q1800  . diazepam  5 mg Oral On Call  . [COMPLETED] heparin  4,000 Units Intravenous Once  . insulin aspart  0-9 Units Subcutaneous TID WC  . metoprolol succinate  50 mg Oral Daily  . nitroGLYCERIN  0.4 mg Transdermal Daily  . [COMPLETED] nitroGLYCERIN  1 inch Topical Once  . omega-3 acid ethyl esters  1 g Oral BID  . pantoprazole  20 mg Oral Daily  . [COMPLETED] potassium chloride  40 mEq Oral Once  . potassium citrate  10 mEq Oral BID  . ramipril  10 mg Oral Daily  . sodium chloride  3 mL Intravenous Q12H  . sodium chloride  3 mL Intravenous Q12H  . [DISCONTINUED] atorvastatin  20 mg Oral  q1800  . [DISCONTINUED] enoxaparin (LOVENOX) injection  40 mg Subcutaneous Q24H  . [DISCONTINUED] fish oil-omega-3 fatty acids  1 capsule Oral BID  . [DISCONTINUED] nitroGLYCERIN  0.4 mg Transdermal Daily  . [DISCONTINUED] Potassium Citrate  1 tablet Oral BID  . [DISCONTINUED] ramipril  10 mg Oral Daily    Assessment: 62 yo male presented with chest pain. Pharmacy consulted to manage IV heparin. The initial heparin level is 0.18; Hg noted at 13.3 and plt= 109 (plt= 120 02/27/12) . Patient noted for cath later today   Goal of Therapy:  Heparin level 0.3-0.7 units/ml Monitor platelets by anticoagulation protocol: Yes   Plan:  -Heparin 2000 unit bolus then increase infusion to 1100 units/hr -Will follow post cath  Harland German, Pharm D 10/03/2012 9:47 AM

## 2012-10-03 NOTE — Brief Op Note (Signed)
  Cardiac Catheterization Procedure Note  Name: [name] MRN: [id number] DOB: 11-12-1950  Procedure: Left Heart Cath, Selective Coronary Angiography, LV angiography  Indication:  Unstable angina.  Previous stenting of the RCA 2009.    Procedural details: The right groin was prepped, draped, and anesthetized with 1% lidocaine. Using modified Seldinger technique, a 5 French sheath was introduced into the right femoral artery. Standard Judkins catheters were used for coronary angiography and left ventriculography. Catheter exchanges were performed over a guidewire. There were no immediate procedural complications. The patient was transferred to the post catheterization recovery area for further monitoring.  I was attempting a radial cath but I was unable to cannulate the radial vessel.   Procedural Findings:   Hemodynamics:     AO 99/60    LV 92/5   Coronary angiography:   Coronary dominance: Right  Left mainstem:   LM distal 60% with plaque extending into the LAD/Circ  Left anterior descending (LAD):   Ostial calcification with 80% stenosis.  Mid diagonal large and normal.    Left circumflex (LCx):  AV groove ostial 95% stenosis.  Moderately severe calcification.  RI large with proximal calcifcation.  Long proximal 25%.  MOM large with long proximal 25%.   Right coronary artery (RCA):  Proximal stent widely patent.  Diffuse 25% lesions with moderate calcification.    Left ventriculography: LVEF is estimated at 55%, there is no significant mitral regurgitation .  There is mild inferior hypokinesis.    Final Conclusions:  Severe two vessel CAD with a preserved EF.  Recommendations: Plan CABG.    Rollene Rotunda 10/03/2012, 4:07 PM

## 2012-10-03 NOTE — Progress Notes (Signed)
[facility] Progress Note [place] TEAM 1 - Stepdown/ICU TEAM   [name] EXB:[id number] DOB: January 08, 1951 DOA: 10/02/2012 PCP: Colette Ribas, MD  Brief narrative: 62 y.o. male history of CAD status post stenting started experiencing chest pain while playing golf. Chest pain lasted for around 15-20 minutes and improved. On the way back home patient started developing chest pain again and came to the ER. His chest pain improved with nitroglycerin but he still had mild persistent chest pain. Cardiac enzymes, EKG, and chest x-ray were unremarkable. Patient denied any shortness of breath palpitation dizziness or diaphoresis.  Assessment/Plan:  Chest pain w/ hx of CAD LAD stent in 1999 - cardiac cath 12/2007 > RCA bare-metal stent placed, LAD stent patent, 50-60% proximal Cx lesion treated medically - Cards following - agree with cath planned for later today   DIABETES MELLITUS, TYPE II CBGs are well-controlled at the present time  HYPERLIPIDEMIA Continue medical management  Hypertension Blood pressure currently well controlled  AAA 3.3 cm in 2012   Chronic Thrombocytopenia Evaluated by Heme in 2007 - platelet count stable at baseline  Hepatic steatosis  Tobacco abuse Counseled patient on the absolute need to discontinue tobacco abuse  Code Status: FULL Family Communication: Spoke with patient and multiple family members at bedside Disposition Plan: depending on cath results - appears medically stable for telemetry bed at this time  Consultants: Cardiology  Procedures: 4/4 - cardiac cath - pending   Antibiotics: none  DVT prophylaxis: Full dose IV heparin  HPI/Subjective: Patient is resting comfortably in bed.  His chest pain has resolved.  He denies nausea vomiting fevers chills shortness of breath or abdominal pain.  He is quite anxious to "hurry up and get the catheterization done."  Objective: Blood pressure 117/68, pulse 69, temperature 97.5 F  (36.4 C), temperature source Oral, resp. rate 15, height 5\' 11"  (1.803 m), weight 75.3 kg (166 lb 0.1 oz), SpO2 96.00%.  Intake/Output Summary (Last 24 hours) at 10/03/12 1303 Last data filed at 10/03/12 1043  Gross per 24 hour  Intake   1442 ml  Output   1150 ml  Net    292 ml    Exam: General: No acute respiratory distress Lungs: Clear to auscultation bilaterally without wheezes or crackles Cardiovascular: Regular rate and rhythm without murmur gallop or rub normal S1 and S2 Abdomen: Nontender, nondistended, soft, bowel sounds positive, no rebound, no ascites, no appreciable mass Extremities: No significant cyanosis, clubbing, or edema bilateral lower extremities  Data Reviewed: Basic Metabolic Panel:  Recent Labs Lab 10/02/12 1824 10/02/12 2304 10/03/12 0440  NA 142  --  140  K 3.9  --  3.5  CL 107  --  105  CO2  --   --  27  GLUCOSE 126*  --  90  BUN 26*  --  23  CREATININE 1.30 1.11 1.14  CALCIUM  --   --  9.1   CBC:  Recent Labs Lab 10/02/12 1813 10/02/12 1824 10/02/12 2304 10/03/12 0440  WBC 9.2  --  9.1 7.6  HGB 14.9 15.0 14.0 13.3  HCT 42.7 44.0 40.3 39.9  MCV 84.9  --  83.8 85.4  PLT 124*  --  118* 109*   Cardiac Enzymes:  Recent Labs Lab 10/02/12 2304 10/03/12 0440 10/03/12 1100  TROPONINI <0.30 <0.30 <0.30   CBG:  Recent Labs Lab 10/03/12 0018 10/03/12 0327 10/03/12 0726  GLUCAP 154* 111* 103*    Recent Results (from the past 240 hour(s))  MRSA PCR SCREENING     Status: None   Collection Time    10/03/12  1:16 AM      Result Value Range Status   MRSA by PCR NEGATIVE  NEGATIVE Final   Comment:            The GeneXpert MRSA Assay (FDA     approved for NASAL specimens     only), is one component of a     comprehensive MRSA colonization     surveillance program. It is not     intended to diagnose MRSA     infection nor to guide or     monitor treatment for     MRSA infections.     Studies:  Recent x-ray studies have been  reviewed in detail by the Attending Physician  Scheduled Meds:  Scheduled Meds: . amLODipine  2.5 mg Oral Daily  . aspirin EC  325 mg Oral Daily  . atorvastatin  80 mg Oral q1800  . diazepam  5 mg Oral On Call  . insulin aspart  0-9 Units Subcutaneous TID WC  . metoprolol succinate  50 mg Oral Daily  . nitroGLYCERIN  0.4 mg Transdermal Daily  . omega-3 acid ethyl esters  1 g Oral BID  . pantoprazole  20 mg Oral Daily  . potassium citrate  10 mEq Oral BID  . ramipril  10 mg Oral Daily  . sodium chloride  3 mL Intravenous Q12H  . sodium chloride  3 mL Intravenous Q12H   Continuous Infusions: . sodium chloride 20 mL/hr (10/03/12 0812)  . [START ON 10/04/2012] sodium chloride 75 mL/hr at 10/03/12 0900  . heparin 1,100 Units/hr (10/03/12 1030)    Time spent on care of this patient:   [facility] T  [facility] Office  [phone] Pager - Text Page per Loretha Stapler as per below:  On-Call/Text Page:      [email or web address]      password [email or web address]  If 7PM-7AM, please contact night-coverage [email or web address] Password [email or web address] 10/03/2012, 1:03 PM   LOS: 1 day

## 2012-10-03 NOTE — Progress Notes (Signed)
ANTICOAGULATION CONSULT NOTE - Initial Consult  Pharmacy Consult for heparin Indication: chest pain/ACS  Allergies  Allergen Reactions  . Meperidine Hcl Other (See Comments)    Was mixed with phenergan and turned blood vessels red.   . Promethazine Hcl Other (See Comments)    Was mixed with the Demerol and caused veins to turn red.     Patient Measurements: Height: 5\' 11"  (180.3 cm) Weight: 166 lb 0.1 oz (75.3 kg) IBW/kg (Calculated) : 75.3 Heparin Dosing Weight: 75 kg  Vital Signs: Temp: 97.9 F (36.6 C) (04/04 0000) Temp src: Oral (04/04 0000) BP: 118/62 mmHg (04/04 0000) Pulse Rate: 75 (04/04 0000)  Labs:  Recent Labs  10/02/12 1813 10/02/12 1824 10/02/12 2304  HGB 14.9 15.0 14.0  HCT 42.7 44.0 40.3  PLT 124*  --  118*  CREATININE  --  1.30 1.11  TROPONINI  --   --  <0.30    Estimated Creatinine Clearance: 76.3 ml/min (by C-G formula based on Cr of 1.11).   Medical History: Past Medical History  Diagnosis Date  . ASCVD (arteriosclerotic cardiovascular disease)     LAD stent in 1999; RCA bare-metal stent in 12/2007, LAD stent patent, 50-60% proximal Cx lesion treated medically  . AAA (abdominal aortic aneurysm)     3.3 cm in 2012  . Hyperlipidemia   . Diabetes mellitus   . Thrombocytopenia     Chronic; evaluated by Dr. Mariel Sleet in 2007  . Tobacco abuse   . Hepatic steatosis   . ED (erectile dysfunction)   . Nephrolithiasis   . Anxiety   . Depression   . Insomnia   . Gastric ulcer   . Coronary artery disease   . MI (myocardial infarction)     Medications:  Scheduled:  . sodium chloride   Intravenous STAT  . amLODipine  2.5 mg Oral Daily  . [COMPLETED] aspirin  324 mg Oral Once  . aspirin EC  325 mg Oral Daily  . atorvastatin  20 mg Oral q1800  . enoxaparin (LOVENOX) injection  40 mg Subcutaneous Q24H  . insulin aspart  0-9 Units Subcutaneous TID WC  . metoprolol succinate  50 mg Oral Daily  . nitroGLYCERIN  0.4 mg Transdermal Daily  .  [COMPLETED] nitroGLYCERIN  1 inch Topical Once  . omega-3 acid ethyl esters  1 g Oral BID  . pantoprazole  20 mg Oral Daily  . potassium citrate  10 mEq Oral BID  . ramipril  10 mg Oral Daily  . sodium chloride  3 mL Intravenous Q12H  . [DISCONTINUED] fish oil-omega-3 fatty acids  1 capsule Oral BID  . [DISCONTINUED] nitroGLYCERIN  0.4 mg Transdermal Daily  . [DISCONTINUED] Potassium Citrate  1 tablet Oral BID  . [DISCONTINUED] ramipril  10 mg Oral Daily    Assessment: 62 yo male presented with chest pain. Pharmacy consulted to manage IV heparin. Baseline Hgb 14 and platelets 118. Patient with h/o of chronic thrombocytopenia.  Goal of Therapy:  Heparin level 0.3-0.7 units/ml Monitor platelets by anticoagulation protocol: Yes   Plan:  1. Heparin 4000 unit IV bolus x 1, then IV infusion of 900 units/hr.  2. Heparin level in 6 hours.  3. Daily CBC, heparin level  Emeline Gins 10/03/2012,1:41 AM

## 2012-10-04 DIAGNOSIS — I2510 Atherosclerotic heart disease of native coronary artery without angina pectoris: Secondary | ICD-10-CM

## 2012-10-04 DIAGNOSIS — I200 Unstable angina: Secondary | ICD-10-CM

## 2012-10-04 LAB — BLOOD GAS, ARTERIAL
Bicarbonate: 22.5 mEq/L (ref 20.0–24.0)
O2 Saturation: 96.8 %
Patient temperature: 98.6
TCO2: 23.7 mmol/L (ref 0–100)
pH, Arterial: 7.361 (ref 7.350–7.450)

## 2012-10-04 LAB — TYPE AND SCREEN: Antibody Screen: NEGATIVE

## 2012-10-04 LAB — GLUCOSE, CAPILLARY
Glucose-Capillary: 150 mg/dL — ABNORMAL HIGH (ref 70–99)
Glucose-Capillary: 121 mg/dL — ABNORMAL HIGH (ref 70–99)

## 2012-10-04 LAB — SURGICAL PCR SCREEN
MRSA, PCR: NEGATIVE
Staphylococcus aureus: POSITIVE — AB

## 2012-10-04 LAB — URINALYSIS, ROUTINE W REFLEX MICROSCOPIC
Bilirubin Urine: NEGATIVE
Glucose, UA: NEGATIVE mg/dL
Hgb urine dipstick: NEGATIVE
Ketones, ur: NEGATIVE mg/dL
Leukocytes, UA: NEGATIVE
Nitrite: NEGATIVE
Protein, ur: 30 mg/dL — AB
Specific Gravity, Urine: 1.01 (ref 1.005–1.030)
Urobilinogen, UA: 0.2 mg/dL (ref 0.0–1.0)
pH: 6.5 (ref 5.0–8.0)

## 2012-10-04 LAB — COMPREHENSIVE METABOLIC PANEL
CO2: 24 mEq/L (ref 19–32)
Calcium: 9 mg/dL (ref 8.4–10.5)
Chloride: 110 mEq/L (ref 96–112)
Creatinine, Ser: 1.05 mg/dL (ref 0.50–1.35)
GFR calc Af Amer: 88 mL/min — ABNORMAL LOW (ref >90)
GFR calc non Af Amer: 76 mL/min — ABNORMAL LOW (ref >90)
Glucose, Bld: 112 mg/dL — ABNORMAL HIGH (ref 70–99)
Total Bilirubin: 0.4 mg/dL (ref 0.3–1.2)

## 2012-10-04 LAB — CBC
MCHC: 33.8 g/dL (ref 30.0–36.0)
Platelets: 99 10*3/uL — ABNORMAL LOW (ref 150–400)
RDW: 13.8 % (ref 11.5–15.5)
WBC: 8.1 10*3/uL (ref 4.0–10.5)

## 2012-10-04 LAB — LIPID PANEL
Cholesterol: 77 mg/dL (ref 0–200)
LDL Cholesterol: 24 mg/dL (ref 0–99)
Total CHOL/HDL Ratio: 2.6 RATIO
Triglycerides: 113 mg/dL (ref <150)
VLDL: 23 mg/dL (ref 0–40)

## 2012-10-04 LAB — ABO/RH: ABO/RH(D): A POS

## 2012-10-04 LAB — URINE MICROSCOPIC-ADD ON

## 2012-10-04 NOTE — Progress Notes (Signed)
ANTICOAGULATION CONSULT NOTE - Follow Up Consult  Pharmacy Consult for heparin Indication: chest pain/ACS  Allergies  Allergen Reactions  . Meperidine Hcl Other (See Comments)    Was mixed with phenergan and turned blood vessels red.   . Promethazine Hcl Other (See Comments)    Was mixed with the Demerol and caused veins to turn red.     Patient Measurements: Height: 5\' 11"  (180.3 cm) Weight: 169 lb 8.5 oz (76.9 kg) IBW/kg (Calculated) : 75.3  Vital Signs: Temp: 97.7 F (36.5 C) (04/05 0800) Temp src: Oral (04/05 0800) BP: 146/79 mmHg (04/05 0800) Pulse Rate: 76 (04/05 0800)  Labs:  Recent Labs  10/02/12 2304 10/03/12 0440 10/03/12 0819 10/03/12 1100 10/04/12 0515 10/04/12 0807  HGB 14.0 13.3  --   --  14.0  --   HCT 40.3 39.9  --   --  41.4  --   PLT 118* 109*  --   --  99*  --   LABPROT  --  14.3  --   --   --   --   INR  --  1.13  --   --   --   --   HEPARINUNFRC  --   --  0.18*  --   --  0.21*  CREATININE 1.11 1.14  --   --  1.05  --   TROPONINI <0.30 <0.30  --  <0.30  --   --     Estimated Creatinine Clearance: 80.7 ml/min (by C-G formula based on Cr of 1.05).  Assessment: Mr. Wease presented with chest pain, he is now s/p LHC which revealed severe 2-vessel disease. Patient currently on heparin gtt 1100 units/hr with a subtherapeutic HL. Plts are trending down at 99 today (plt=120 02/27/12), Hgb stable. No note of bleeding.   Goal of Therapy:  Heparin level 0.3-0.7 units/ml Monitor platelets by anticoagulation protocol: Yes   Plan:  - Increase heparin gtt to 1250 units/hr (no bolus due to low platelets)  - 6h HL due @ 1600 - Monitor platelets and signs/sxs of bleeding   Thanks, [employer], Pharm.D. Clinical Pharmacist   Pager: [phone] 10/04/2012 9:16 AM

## 2012-10-04 NOTE — Progress Notes (Signed)
Patient ID: [name], male   DOB: 1951-06-17, 62 y.o.   MRN: [id number]    SUBJECTIVE: No chest pain this morning, no complaints.   Marland Kitchen amLODipine  2.5 mg Oral Daily  . aspirin EC  325 mg Oral Daily  . atorvastatin  80 mg Oral q1800  . insulin aspart  0-9 Units Subcutaneous TID WC  . metoprolol succinate  50 mg Oral Daily  . nitroGLYCERIN  0.4 mg Transdermal Daily  . omega-3 acid ethyl esters  1 g Oral BID  . pantoprazole  20 mg Oral Daily  . potassium citrate  10 mEq Oral BID  . ramipril  10 mg Oral Daily  . sodium chloride  3 mL Intravenous Q12H  heparin gtt    Filed Vitals:   10/04/12 0200 10/04/12 0300 10/04/12 0400 10/04/12 0800  BP: 120/66 122/66 129/76 146/79  Pulse: 72 79 63 76  Temp:   98.2 F (36.8 C) 97.7 F (36.5 C)  TempSrc:   Oral Oral  Resp: 13 16 15 15   Height:      Weight:      SpO2: 98% 98% 98% 100%    Intake/Output Summary (Last 24 hours) at 10/04/12 0914 Last data filed at 10/04/12 0700  Gross per 24 hour  Intake   1213 ml  Output   2650 ml  Net  -1437 ml    LABS: Basic Metabolic Panel:  Recent Labs  40/98/11 0440 10/04/12 0515  NA 140 143  K 3.5 4.7  CL 105 110  CO2 27 24  GLUCOSE 90 112*  BUN 23 15  CREATININE 1.14 1.05  CALCIUM 9.1 9.0   Liver Function Tests:  Recent Labs  10/04/12 0515  AST 19  ALT 31  ALKPHOS 68  BILITOT 0.4  PROT 6.5  ALBUMIN 3.6   No results found for this basename: LIPASE, AMYLASE,  in the last 72 hours CBC:  Recent Labs  10/03/12 0440 10/04/12 0515  WBC 7.6 8.1  HGB 13.3 14.0  HCT 39.9 41.4  MCV 85.4 86.6  PLT 109* 99*   Cardiac Enzymes:  Recent Labs  10/02/12 2304 10/03/12 0440 10/03/12 1100  TROPONINI <0.30 <0.30 <0.30   BNP: No components found with this basename: POCBNP,  D-Dimer: No results found for this basename: DDIMER,  in the last 72 hours Hemoglobin A1C: No results found for this basename: HGBA1C,  in the last 72 hours Fasting Lipid Panel:  Recent Labs  10/04/12 0515  CHOL 77  HDL 30*  LDLCALC 24  TRIG 914  CHOLHDL 2.6   Thyroid Function Tests: No results found for this basename: TSH, T4TOTAL, FREET3, T3FREE, THYROIDAB,  in the last 72 hours Anemia Panel: No results found for this basename: VITAMINB12, FOLATE, FERRITIN, TIBC, IRON, RETICCTPCT,  in the last 72 hours  RADIOLOGY: Dg Chest 2 View  10/02/2012  *RADIOLOGY REPORT*  Clinical Data: Hypertension and diabetes.  Chest pain  CHEST - 2 VIEW  Comparison: Chest x-ray 04/12/2008.  Findings: Normal heart size with clear lung fields.  No bony abnormality.  IMPRESSION: No active cardiopulmonary disease.  Similar appearance to priors.   Original Report Authenticated By: Davonna Belling, M.D.     PHYSICAL EXAM General: NAD Neck: No JVD, no thyromegaly or thyroid nodule.  Lungs: Clear to auscultation bilaterally with normal respiratory effort. CV: Nondisplaced PMI.  Heart regular S1/S2, no S3/S4, no murmur.  No peripheral edema.  No carotid bruit.  Normal pedal pulses.  Abdomen: Soft, nontender, no hepatosplenomegaly,  no distention.  Neurologic: Alert and oriented x 3.  Psych: Normal affect. Extremities: No clubbing or cyanosis.   TELEMETRY: Reviewed telemetry pt in NSR  ASSESSMENT AND PLAN: 62 yo with history of CAD, HTN presented with unstable angina and was found to have significant LM, LAD, and LCx disease.  Plan for CABG.   - Will make sure CVTS has been consulted.  - Continue current medication regimen.   Marca Ancona 10/04/2012 9:19 AM

## 2012-10-04 NOTE — Progress Notes (Signed)
1 Day Post-Op Procedure(s) (LRB): LEFT HEART CATHETERIZATION WITH CORONARY ANGIOGRAM (N/A) Subjective: 62 year old patient with prior history of PCI admitted with unstable angina and negative enzymes. Cardiac catheterization demonstrates moderate left main stenosis with multi-vessel- CAD. Past medical history significant for smoking, type 2 diabetes, and mild thrombocytopenia with current platelet count 100,000. The patient will be scheduled for CABG by Dr: Cornelius Moras Tuesday, April 8  Objective: Vital signs in last 24 hours: Temp:  [97.7 F (36.5 C)-98.7 F (37.1 C)] 97.7 F (36.5 C) (04/05 0800) Pulse Rate:  [57-79] 78 (04/05 0945) Cardiac Rhythm:  [-] Normal sinus rhythm (04/05 1100) Resp:  [11-20] 16 (04/05 0900) BP: (106-146)/(57-84) 145/72 mmHg (04/05 1043) SpO2:  [93 %-100 %] 99 % (04/05 0900) Weight:  [169 lb 8.5 oz (76.9 kg)] 169 lb 8.5 oz (76.9 kg) (04/04 1715)  Hemodynamic parameters for last 24 hours:   stable  Intake/Output from previous day: 04/04 0701 - 04/05 0700 In: 1886 [P.O.:855; I.V.:1031] Out: 3300 [Urine:3300] Intake/Output this shift: Total I/O In: 93.8 [I.V.:93.8] Out: 400 [Urine:400]  Sinus rhythm Cath site without hematoma Resting comfortably  Lab Results:  Recent Labs  10/03/12 0440 10/04/12 0515  WBC 7.6 8.1  HGB 13.3 14.0  HCT 39.9 41.4  PLT 109* 99*   BMET:  Recent Labs  10/03/12 0440 10/04/12 0515  NA 140 143  K 3.5 4.7  CL 105 110  CO2 27 24  GLUCOSE 90 112*  BUN 23 15  CREATININE 1.14 1.05  CALCIUM 9.1 9.0    PT/INR:  Recent Labs  10/03/12 0440  LABPROT 14.3  INR 1.13   ABG    Component Value Date/Time   PHART 7.361 10/04/2012 0510   HCO3 22.5 10/04/2012 0510   TCO2 23.7 10/04/2012 0510   ACIDBASEDEF 2.1* 10/04/2012 0510   O2SAT 96.8 10/04/2012 0510   CBG (last 3)   Recent Labs  10/03/12 1849 10/03/12 2059 10/04/12 0756  GLUCAP 108* 150* 121*    Assessment/Plan: S/P Procedure(s) (LRB): LEFT HEART CATHETERIZATION  WITH CORONARY ANGIOGRAM (N/A) CABG tentatively scheduled for Dr. Cornelius Moras Tuesday, April 8.   LOS: 2 days    VAN TRIGT III,PETER 10/04/2012

## 2012-10-04 NOTE — Progress Notes (Signed)
[facility] Progress Note [name] TEAM 1 - Stepdown/ICU TEAM   [name] QIO:[id number] DOB: 1951-02-21 DOA: 10/02/2012 PCP: Guy Ribas, MD  Brief narrative: 63 y.o. male history of CAD status post stenting started experiencing chest pain while playing golf. Chest pain lasted for around 15-20 minutes and improved. On the way back home patient started developing chest pain again and came to the ER. His chest pain improved with nitroglycerin but he still had mild persistent chest pain. Cardiac enzymes, EKG, and chest x-ray were unremarkable. Patient denied any shortness of breath palpitation dizziness or diaphoresis.  Assessment/Plan:  Chest pain w/ hx of CAD Found to have severe two-vessel coronary disease on cardiac cath 4/4 - TCTS has been consulted by Cardiology - CABG anticipated Tuesday  DIABETES MELLITUS, TYPE II CBGs are reasonably controlled at the present time  HYPERLIPIDEMIA Continue medical management  Hypertension Blood pressure currently reasonably controlled  AAA 3.3 cm in 2012   Chronic Thrombocytopenia Evaluated by Heme in 2007 - platelet count stable at baseline  Hepatic steatosis  Tobacco abuse Counseled patient on the absolute need to discontinue tobacco abuse  Code Status: FULL Family Communication: Spoke with patient and wife at bedside Disposition Plan: Step down unit  Consultants: Cardiology TCTS  Procedures: 4/4 - cardiac cath - severe two-vessel coronary disease  Antibiotics: none  DVT prophylaxis: Full dose IV heparin  HPI/Subjective: Patient is resting comfortably the present time.  He denies current chest pain.  He denies nausea vomiting or abdominal pain.  Objective: Blood pressure 145/72, pulse 78, temperature 97.7 F (36.5 C), temperature source Oral, resp. rate 16, height 5\' 11"  (1.803 m), weight 76.9 kg (169 lb 8.5 oz), SpO2 99.00%.  Intake/Output Summary (Last 24 hours) at 10/04/12 1123 Last data filed  at 10/04/12 1000  Gross per 24 hour  Intake 1136.77 ml  Output   2550 ml  Net -1413.23 ml    Exam: General: No acute respiratory distress Lungs: Clear to auscultation bilaterally without wheezes or crackles Cardiovascular: Regular rate and rhythm without murmur gallop or rub  Abdomen: Nontender, nondistended, soft, bowel sounds positive, no rebound, no ascites, no appreciable mass Extremities: No significant cyanosis, clubbing, or edema bilateral lower extremities  Data Reviewed: Basic Metabolic Panel:  Recent Labs Lab 10/02/12 1824 10/02/12 2304 10/03/12 0440 10/04/12 0515  NA 142  --  140 143  K 3.9  --  3.5 4.7  CL 107  --  105 110  CO2  --   --  27 24  GLUCOSE 126*  --  90 112*  BUN 26*  --  23 15  CREATININE 1.30 1.11 1.14 1.05  CALCIUM  --   --  9.1 9.0   CBC:  Recent Labs Lab 10/02/12 1813 10/02/12 1824 10/02/12 2304 10/03/12 0440 10/04/12 0515  WBC 9.2  --  9.1 7.6 8.1  HGB 14.9 15.0 14.0 13.3 14.0  HCT 42.7 44.0 40.3 39.9 41.4  MCV 84.9  --  83.8 85.4 86.6  PLT 124*  --  118* 109* 99*   Cardiac Enzymes:  Recent Labs Lab 10/02/12 2304 10/03/12 0440 10/03/12 1100  TROPONINI <0.30 <0.30 <0.30   CBG:  Recent Labs Lab 10/03/12 0726 10/03/12 1201 10/03/12 1849 10/03/12 2059 10/04/12 0756  GLUCAP 103* 96 108* 150* 121*    Recent Results (from the past 240 hour(s))  MRSA PCR SCREENING     Status: None   Collection Time    10/03/12  1:16 AM  Result Value Range Status   MRSA by PCR NEGATIVE  NEGATIVE Final   Comment:            The GeneXpert MRSA Assay (FDA     approved for NASAL specimens     only), is one component of a     comprehensive MRSA colonization     surveillance program. It is not     intended to diagnose MRSA     infection nor to guide or     monitor treatment for     MRSA infections.     Studies:  Recent x-ray studies have been reviewed in detail by the Attending Physician  Scheduled Meds:  Scheduled Meds: .  amLODipine  2.5 mg Oral Daily  . aspirin EC  325 mg Oral Daily  . atorvastatin  80 mg Oral q1800  . insulin aspart  0-9 Units Subcutaneous TID WC  . metoprolol succinate  50 mg Oral Daily  . nitroGLYCERIN  0.4 mg Transdermal Daily  . omega-3 acid ethyl esters  1 g Oral BID  . pantoprazole  20 mg Oral Daily  . potassium citrate  10 mEq Oral BID  . ramipril  10 mg Oral Daily  . sodium chloride  3 mL Intravenous Q12H   Continuous Infusions: . sodium chloride 20 mL/hr at 10/04/12 0700  . heparin 1,250 Units/hr (10/04/12 0929)    Time spent on care of this patient:   [facility]  [facility] Office  [phone] Pager - Text Page per [name] as per below:  On-Call/Text Page:      [name]      password [email or web address]  If 7PM-7AM, please contact night-coverage [email or web address] Password [email or web address] 10/04/2012, 11:23 AM   LOS: 2 days

## 2012-10-04 NOTE — Progress Notes (Signed)
ANTICOAGULATION CONSULT NOTE - Follow Up Consult  Pharmacy Consult for heparin Indication: chest pain/ACS  Allergies  Allergen Reactions  . Meperidine Hcl Other (See Comments)    Was mixed with phenergan and turned blood vessels red.   . Promethazine Hcl Other (See Comments)    Was mixed with the Demerol and caused veins to turn red.     Patient Measurements: Height: 5\' 11"  (180.3 cm) Weight: 169 lb 8.5 oz (76.9 kg) IBW/kg (Calculated) : 75.3  Vital Signs: Temp: 97.5 F (36.4 C) (04/05 1600) Temp src: Oral (04/05 1600) BP: 126/81 mmHg (04/05 1600) Pulse Rate: 74 (04/05 1600)  Labs:  Recent Labs  10/02/12 2304 10/03/12 0440 10/03/12 0819 10/03/12 1100 10/04/12 0515 10/04/12 0807 10/04/12 1532  HGB 14.0 13.3  --   --  14.0  --   --   HCT 40.3 39.9  --   --  41.4  --   --   PLT 118* 109*  --   --  99*  --   --   LABPROT  --  14.3  --   --   --   --   --   INR  --  1.13  --   --   --   --   --   HEPARINUNFRC  --   --  0.18*  --   --  0.21* 0.29*  CREATININE 1.11 1.14  --   --  1.05  --   --   TROPONINI <0.30 <0.30  --  <0.30  --   --   --     Estimated Creatinine Clearance: 80.7 ml/min (by C-G formula based on Cr of 1.05).  Assessment: Mr. Aikman presented with chest pain, he is now s/p LHC which revealed severe 2-vessel disease. Patient currently on heparin gtt 1250 units/hr , which is slightly subtherapeutic.  Will increase gtt rate.   Goal of Therapy:  Heparin level 0.3-0.7 units/ml Monitor platelets by anticoagulation protocol: Yes   Plan:  - Increase heparin gtt to 1400 units/hr and check level in 6 hrs.  Verlene Mayer, PharmD, BCPS Pager [phone] 10/04/2012 4:42 PM

## 2012-10-04 NOTE — Progress Notes (Signed)
Dr Shirlee Latch notified of frequent trigeminy PVCs. No orders received.

## 2012-10-05 DIAGNOSIS — I2510 Atherosclerotic heart disease of native coronary artery without angina pectoris: Secondary | ICD-10-CM

## 2012-10-05 LAB — HEPATIC FUNCTION PANEL
ALT: 33 U/L (ref 0–53)
AST: 20 U/L (ref 0–37)
Albumin: 3.7 g/dL (ref 3.5–5.2)
Alkaline Phosphatase: 76 U/L (ref 39–117)
Bilirubin, Direct: 0.1 mg/dL (ref 0.0–0.3)
Indirect Bilirubin: 0.3 mg/dL (ref 0.3–0.9)
Total Bilirubin: 0.4 mg/dL (ref 0.3–1.2)
Total Protein: 6.9 g/dL (ref 6.0–8.3)

## 2012-10-05 LAB — CBC
HCT: 41.1 % (ref 39.0–52.0)
Hemoglobin: 14 g/dL (ref 13.0–17.0)
MCHC: 34.1 g/dL (ref 30.0–36.0)
MCV: 85.4 fL (ref 78.0–100.0)

## 2012-10-05 LAB — HEPARIN LEVEL (UNFRACTIONATED): Heparin Unfractionated: 0.55 IU/mL (ref 0.30–0.70)

## 2012-10-05 LAB — GLUCOSE, CAPILLARY: Glucose-Capillary: 127 mg/dL — ABNORMAL HIGH (ref 70–99)

## 2012-10-05 NOTE — Progress Notes (Signed)
ANTICOAGULATION CONSULT NOTE - Follow Up Consult  Pharmacy Consult for heparin Indication: chest pain/ACS  Allergies  Allergen Reactions  . Meperidine Hcl Other (See Comments)    Was mixed with phenergan and turned blood vessels red.   . Promethazine Hcl Other (See Comments)    Was mixed with the Demerol and caused veins to turn red.     Patient Measurements: Height: 5\' 11"  (180.3 cm) Weight: 169 lb 8.5 oz (76.9 kg) IBW/kg (Calculated) : 75.3  Vital Signs: Temp: 97.6 F (36.4 C) (04/05 2300) Temp src: Oral (04/05 2300) BP: 150/87 mmHg (04/05 1944) Pulse Rate: 69 (04/05 1944)  Labs:  Recent Labs  10/02/12 2304 10/03/12 0440  10/03/12 1100 10/04/12 0515 10/04/12 0807 10/04/12 1532 10/04/12 2353  HGB 14.0 13.3  --   --  14.0  --   --   --   HCT 40.3 39.9  --   --  41.4  --   --   --   PLT 118* 109*  --   --  99*  --   --   --   LABPROT  --  14.3  --   --   --   --   --   --   INR  --  1.13  --   --   --   --   --   --   HEPARINUNFRC  --   --   < >  --   --  0.21* 0.29* 0.55  CREATININE 1.11 1.14  --   --  1.05  --   --   --   TROPONINI <0.30 <0.30  --  <0.30  --   --   --   --   < > = values in this interval not displayed.  Estimated Creatinine Clearance: 80.7 ml/min (by C-G formula based on Cr of 1.05).  Assessment: Mr. Donoghue presented with chest pain, he is now s/p LHC which revealed severe 2-vessel disease.   Heparin level (0.55) is at-goal on 1400 units/hr.   Goal of Therapy:  Heparin level 0.3-0.7 units/ml Monitor platelets by anticoagulation protocol: Yes   Plan:  1. Continue IV heparin at 1400 units/hr. 2. Daily CBC, heparin level  Lorre Munroe, PharmD  10/05/2012 1:16 AM

## 2012-10-05 NOTE — Plan of Care (Signed)
Problem: Phase III Progression Outcomes Goal: Other Phase III Outcomes/Goals Participating in prep for CABG( watching videos,using incentive spirometer,reading booklet,etc...)

## 2012-10-05 NOTE — Progress Notes (Signed)
Pt and spouse completed watching "Preparing for Heart Surgery" and Recovering from Heart Surgery" videos .Pt used incentive spirometer(correctly) to 2500 ml mark several times .Calf pumping exercises reviewed .Basic post-op routine care /activity reviewed .Basic post d/c home activity reviewed .Cardiac surgery booklet @ bedside .

## 2012-10-05 NOTE — Progress Notes (Signed)
  Echocardiogram 2D Echocardiogram has been performed.  Aneyah Lortz FRANCES 10/05/2012, 12:25 PM

## 2012-10-05 NOTE — Progress Notes (Signed)
[facility] Progress Note [place] TEAM 1 - Stepdown/ICU TEAM   [name] ZOX:[id number] DOB: 08/18/1950 DOA: 10/02/2012 PCP: Colette Ribas, MD  Brief narrative: 63 y.o. male history of CAD status post stenting started experiencing chest pain while playing golf. Chest pain lasted for around 15-20 minutes and improved. On the way back home patient started developing chest pain again and came to the ER. His chest pain improved with nitroglycerin but he still had mild persistent chest pain. Cardiac enzymes, EKG, and chest x-ray were unremarkable. Patient denied any shortness of breath palpitation dizziness or diaphoresis.  Assessment/Plan:  Chest pain w/ hx of CAD Found to have severe two-vessel coronary disease on cardiac cath 4/4 - TCTS has been consulted by Cardiology - CABG anticipated Tuesday  DIABETES MELLITUS, TYPE II CBGs are reasonably controlled at the present time  HYPERLIPIDEMIA Continue medical management - LDL 24  Hypertension Blood pressure currently reasonably controlled  AAA 3.3 cm in 2012   Chronic Thrombocytopenia Evaluated by Heme in 2007 - platelet count stable at his baseline  Hepatic steatosis LFTs have been normal  Tobacco abuse Counseled patient on the absolute need to discontinue tobacco abuse  Code Status: FULL Family Communication: Spoke with patient and wife at bedside Disposition Plan: Step down unit  Consultants: Cardiology TCTS  Procedures: 4/4 - cardiac cath - severe two-vessel coronary disease 4/6 - TTE - preserved LV systolic function without significant valvular abnormality  Antibiotics: none  DVT prophylaxis: Full dose IV heparin  HPI/Subjective: Patient is resting comfortably.  He denies chest pain, nausea vomiting or abdominal pain.  Objective: Blood pressure 140/79, pulse 65, temperature 97.7 F (36.5 C), temperature source Oral, resp. rate 17, height 5\' 11"  (1.803 m), weight 76.9 kg (169 lb 8.5 oz),  SpO2 96.00%.  Intake/Output Summary (Last 24 hours) at 10/05/12 1427 Last data filed at 10/05/12 1200  Gross per 24 hour  Intake 1412.13 ml  Output   3675 ml  Net -2262.87 ml    Exam: General: No acute respiratory distress Lungs: Clear to auscultation bilaterally without wheezes or crackles Cardiovascular: Regular rate and rhythm without murmur gallop or rub  Abdomen: Nontender, nondistended, soft, bowel sounds positive, no rebound, no ascites Extremities: No significant cyanosis, clubbing, or edema bilateral lower extremities  Data Reviewed: Basic Metabolic Panel:  Recent Labs Lab 10/02/12 1824 10/02/12 2304 10/03/12 0440 10/04/12 0515  NA 142  --  140 143  K 3.9  --  3.5 4.7  CL 107  --  105 110  CO2  --   --  27 24  GLUCOSE 126*  --  90 112*  BUN 26*  --  23 15  CREATININE 1.30 1.11 1.14 1.05  CALCIUM  --   --  9.1 9.0   CBC:  Recent Labs Lab 10/02/12 1813 10/02/12 1824 10/02/12 2304 10/03/12 0440 10/04/12 0515 10/05/12 0446  WBC 9.2  --  9.1 7.6 8.1 7.5  HGB 14.9 15.0 14.0 13.3 14.0 14.0  HCT 42.7 44.0 40.3 39.9 41.4 41.1  MCV 84.9  --  83.8 85.4 86.6 85.4  PLT 124*  --  118* 109* 99* 102*   Cardiac Enzymes:  Recent Labs Lab 10/02/12 2304 10/03/12 0440 10/03/12 1100  TROPONINI <0.30 <0.30 <0.30   CBG:  Recent Labs Lab 10/04/12 0756 10/04/12 1200 10/04/12 1658 10/04/12 2128 10/05/12 1220  GLUCAP 121* 125* 154* 118* 100*    Recent Results (from the past 240 hour(s))  MRSA PCR SCREENING  Status: None   Collection Time    10/03/12  1:16 AM      Result Value Range Status   MRSA by PCR NEGATIVE  NEGATIVE Final   Comment:            The GeneXpert MRSA Assay (FDA     approved for NASAL specimens     only), is one component of a     comprehensive MRSA colonization     surveillance program. It is not     intended to diagnose MRSA     infection nor to guide or     monitor treatment for     MRSA infections.  SURGICAL PCR SCREEN      Status: Abnormal   Collection Time    10/04/12  1:27 PM      Result Value Range Status   MRSA, PCR NEGATIVE  NEGATIVE Final   Staphylococcus aureus POSITIVE (*) NEGATIVE Final   Comment:            The Xpert SA Assay (FDA     approved for NASAL specimens     in patients over 22 years of age),     is one component of     a comprehensive surveillance     program.  Test performance has     been validated by [employer] for patients greater     than or equal to 63 year old.     It is not intended     to diagnose infection nor to     guide or monitor treatment.     Studies:  Recent x-ray studies have been reviewed in detail by the Attending Physician  Scheduled Meds:  Scheduled Meds: . amLODipine  2.5 mg Oral Daily  . aspirin EC  325 mg Oral Daily  . atorvastatin  80 mg Oral q1800  . insulin aspart  0-9 Units Subcutaneous TID WC  . metoprolol succinate  50 mg Oral Daily  . nitroGLYCERIN  0.4 mg Transdermal Daily  . pantoprazole  20 mg Oral Daily  . potassium citrate  10 mEq Oral BID  . ramipril  10 mg Oral Daily   Continuous Infusions: . sodium chloride 20 mL/hr at 10/04/12 0700  . heparin 1,400 Units/hr (10/05/12 0600)    Time spent on care of this patient:   [facility]  [facility] Office  [phone] Pager - Text Page per Loretha Stapler as per below:  On-Call/Text Page:      [email or web address]      password [email or web address]  If 7PM-7AM, please contact night-coverage [email or web address] Password [email or web address] 10/05/2012, 2:27 PM   LOS: 3 days

## 2012-10-05 NOTE — Progress Notes (Signed)
ANTICOAGULATION CONSULT NOTE - Follow Up Consult  Pharmacy Consult for heparin Indication: chest pain/ACS  Allergies  Allergen Reactions  . Meperidine Hcl Other (See Comments)    Was mixed with phenergan and turned blood vessels red.   . Promethazine Hcl Other (See Comments)    Was mixed with the Demerol and caused veins to turn red.     Patient Measurements: Height: 5\' 11"  (180.3 cm) Weight: 169 lb 8.5 oz (76.9 kg) IBW/kg (Calculated) : 75.3  Vital Signs: Temp: 97.9 F (36.6 C) (04/06 0318) Temp src: Oral (04/06 0318) BP: 122/84 mmHg (04/06 0318) Pulse Rate: 57 (04/06 0318)  Labs:  Recent Labs  10/02/12 2304 10/03/12 0440  10/03/12 1100 10/04/12 0515  10/04/12 1532 10/04/12 2353 10/05/12 0446  HGB 14.0 13.3  --   --  14.0  --   --   --  14.0  HCT 40.3 39.9  --   --  41.4  --   --   --  41.1  PLT 118* 109*  --   --  99*  --   --   --  102*  LABPROT  --  14.3  --   --   --   --   --   --   --   INR  --  1.13  --   --   --   --   --   --   --   HEPARINUNFRC  --   --   < >  --   --   < > 0.29* 0.55 0.64  CREATININE 1.11 1.14  --   --  1.05  --   --   --   --   TROPONINI <0.30 <0.30  --  <0.30  --   --   --   --   --   < > = values in this interval not displayed.  Estimated Creatinine Clearance: 80.7 ml/min (by C-G formula based on Cr of 1.05).  Assessment: [name] presented with chest pain, he is now s/p LHC which revealed severe 2-vessel disease.   Heparin level (0.64) is at-goal on 1400 units/hr.  CBC stable, no bleeding noted.  Planned for CABG on Tuesday.   Goal of Therapy:  Heparin level 0.3-0.7 units/ml Monitor platelets by anticoagulation protocol: Yes   Plan:  1. Continue IV heparin at 1400 units/hr. 2. Daily CBC, heparin level   Doris Cheadle, PharmD Clinical Pharmacist Pager: [phone] Phone: [phone] 10/05/2012 7:02 AM

## 2012-10-05 NOTE — Progress Notes (Signed)
Patient ID: [name], male   DOB: 09/04/1950, 62 y.o.   MRN: [id number]    SUBJECTIVE: Brief slight CP this morning, otherwise has been fine.    Marland Kitchen amLODipine  2.5 mg Oral Daily  . aspirin EC  325 mg Oral Daily  . atorvastatin  80 mg Oral q1800  . insulin aspart  0-9 Units Subcutaneous TID WC  . metoprolol succinate  50 mg Oral Daily  . nitroGLYCERIN  0.4 mg Transdermal Daily  . pantoprazole  20 mg Oral Daily  . potassium citrate  10 mEq Oral BID  . ramipril  10 mg Oral Daily  heparin gtt    Filed Vitals:   10/04/12 2300 10/04/12 2340 10/05/12 0318 10/05/12 0734  BP:  138/81 122/84 117/76  Pulse:  70 57 56  Temp: 97.6 F (36.4 C)  97.9 F (36.6 C) 97.5 F (36.4 C)  TempSrc: Oral  Oral Oral  Resp:  14 13 15   Height:      Weight:      SpO2:  94% 95% 97%    Intake/Output Summary (Last 24 hours) at 10/05/12 0948 Last data filed at 10/05/12 0830  Gross per 24 hour  Intake 1928.4 ml  Output   3700 ml  Net -1771.6 ml    LABS: Basic Metabolic Panel:  Recent Labs  40/98/11 0440 10/04/12 0515  NA 140 143  K 3.5 4.7  CL 105 110  CO2 27 24  GLUCOSE 90 112*  BUN 23 15  CREATININE 1.14 1.05  CALCIUM 9.1 9.0   Liver Function Tests:  Recent Labs  10/04/12 0515 10/05/12 0446  AST 19 20  ALT 31 33  ALKPHOS 68 76  BILITOT 0.4 0.4  PROT 6.5 6.9  ALBUMIN 3.6 3.7   No results found for this basename: LIPASE, AMYLASE,  in the last 72 hours CBC:  Recent Labs  10/04/12 0515 10/05/12 0446  WBC 8.1 7.5  HGB 14.0 14.0  HCT 41.4 41.1  MCV 86.6 85.4  PLT 99* 102*   Cardiac Enzymes:  Recent Labs  10/02/12 2304 10/03/12 0440 10/03/12 1100  TROPONINI <0.30 <0.30 <0.30   BNP: No components found with this basename: POCBNP,  D-Dimer: No results found for this basename: DDIMER,  in the last 72 hours Hemoglobin A1C:  Recent Labs  10/04/12 0515  HGBA1C 6.0*   Fasting Lipid Panel:  Recent Labs  10/04/12 0515  CHOL 77  HDL 30*  LDLCALC 24    TRIG 113  CHOLHDL 2.6   Thyroid Function Tests: No results found for this basename: TSH, T4TOTAL, FREET3, T3FREE, THYROIDAB,  in the last 72 hours Anemia Panel: No results found for this basename: VITAMINB12, FOLATE, FERRITIN, TIBC, IRON, RETICCTPCT,  in the last 72 hours  RADIOLOGY: Dg Chest 2 View  10/02/2012  *RADIOLOGY REPORT*  Clinical Data: Hypertension and diabetes.  Chest pain  CHEST - 2 VIEW  Comparison: Chest x-ray 04/12/2008.  Findings: Normal heart size with clear lung fields.  No bony abnormality.  IMPRESSION: No active cardiopulmonary disease.  Similar appearance to priors.   Original Report Authenticated By: Davonna Belling, M.D.     PHYSICAL EXAM General: NAD Neck: No JVD, no thyromegaly or thyroid nodule.  Lungs: Clear to auscultation bilaterally with normal respiratory effort. CV: Nondisplaced PMI.  Heart regular S1/S2, no S3/S4, no murmur.  No peripheral edema.  No carotid bruit.  Normal pedal pulses.  Abdomen: Soft, nontender, no hepatosplenomegaly, no distention.  Neurologic: Alert and oriented x 3.  Psych: Normal affect. Extremities: No clubbing or cyanosis.   TELEMETRY: Reviewed telemetry pt in NSR  ASSESSMENT AND PLAN: 62 yo with history of CAD, HTN presented with unstable angina and was found to have significant LM, LAD, and LCx disease.   1. CAD: Plan for CABG => Dr. Cornelius Moras on Tuesday.  Very mild brief CP this morning.  - Continue current medication regimen including IV heparin. 2. Thrombocytopenia: Mild, chronic (noted prior to admission at about the current level).    Marca Ancona 10/05/2012 9:48 AM

## 2012-10-06 ENCOUNTER — Encounter (HOSPITAL_COMMUNITY): Payer: Self-pay | Admitting: Thoracic Surgery (Cardiothoracic Vascular Surgery)

## 2012-10-06 ENCOUNTER — Inpatient Hospital Stay (HOSPITAL_COMMUNITY): Payer: PRIVATE HEALTH INSURANCE

## 2012-10-06 DIAGNOSIS — Z01810 Encounter for preprocedural cardiovascular examination: Secondary | ICD-10-CM

## 2012-10-06 DIAGNOSIS — I2510 Atherosclerotic heart disease of native coronary artery without angina pectoris: Secondary | ICD-10-CM

## 2012-10-06 LAB — HEPARIN LEVEL (UNFRACTIONATED)
Heparin Unfractionated: 0.8 IU/mL — ABNORMAL HIGH (ref 0.30–0.70)
Heparin Unfractionated: 0.7 IU/mL (ref 0.30–0.70)

## 2012-10-06 LAB — CBC
MCH: 28.6 pg (ref 26.0–34.0)
MCHC: 33.3 g/dL (ref 30.0–36.0)
Platelets: 107 10*3/uL — ABNORMAL LOW (ref 150–400)
RBC: 4.86 MIL/uL (ref 4.22–5.81)
RDW: 13.6 % (ref 11.5–15.5)

## 2012-10-06 LAB — GLUCOSE, CAPILLARY
Glucose-Capillary: 130 mg/dL — ABNORMAL HIGH (ref 70–99)
Glucose-Capillary: 153 mg/dL — ABNORMAL HIGH (ref 70–99)
Glucose-Capillary: 148 mg/dL — ABNORMAL HIGH (ref 70–99)

## 2012-10-06 LAB — BASIC METABOLIC PANEL
BUN: 15 mg/dL (ref 6–23)
Calcium: 9.4 mg/dL (ref 8.4–10.5)
Creatinine, Ser: 1.15 mg/dL (ref 0.50–1.35)
GFR calc Af Amer: 79 mL/min — ABNORMAL LOW (ref >90)
GFR calc non Af Amer: 68 mL/min — ABNORMAL LOW (ref >90)

## 2012-10-06 MED ORDER — VANCOMYCIN HCL 1000 MG IV SOLR
INTRAVENOUS | Status: AC
  Administered 2012-10-07: 17:00:00
  Filled 2012-10-06 (×2): qty 1000

## 2012-10-06 MED ORDER — DEXTROSE 5 % IV SOLN
750.0000 mg | INTRAVENOUS | Status: DC
  Filled 2012-10-06: qty 750

## 2012-10-06 MED ORDER — TEMAZEPAM 15 MG PO CAPS: 15.0000 mg | ORAL_CAPSULE | Freq: Once | ORAL | Status: AC | PRN

## 2012-10-06 MED ORDER — DOPAMINE-DEXTROSE 3.2-5 MG/ML-% IV SOLN
2.0000 ug/kg/min | INTRAVENOUS | Status: DC
  Filled 2012-10-06: qty 250

## 2012-10-06 MED ORDER — PHENYLEPHRINE HCL 10 MG/ML IJ SOLN
30.0000 ug/min | INTRAVENOUS | Status: DC
  Filled 2012-10-06: qty 2

## 2012-10-06 MED ORDER — ALBUTEROL SULFATE (5 MG/ML) 0.5% IN NEBU
2.5000 mg | INHALATION_SOLUTION | Freq: Once | RESPIRATORY_TRACT | Status: AC
  Administered 2012-10-06: 2.5 mg via RESPIRATORY_TRACT

## 2012-10-06 MED ORDER — VANCOMYCIN HCL 10 G IV SOLR
1250.0000 mg | INTRAVENOUS | Status: AC
  Administered 2012-10-07: 1250 mg via INTRAVENOUS
  Filled 2012-10-06 (×2): qty 1250

## 2012-10-06 MED ORDER — METOPROLOL TARTRATE 12.5 MG HALF TABLET
12.5000 mg | ORAL_TABLET | Freq: Once | ORAL | Status: AC
  Administered 2012-10-07: 12.5 mg via ORAL
  Filled 2012-10-06: qty 1

## 2012-10-06 MED ORDER — POTASSIUM CHLORIDE 2 MEQ/ML IV SOLN
80.0000 meq | INTRAVENOUS | Status: DC
  Filled 2012-10-06: qty 40

## 2012-10-06 MED ORDER — EPINEPHRINE HCL 1 MG/ML IJ SOLN
0.5000 ug/min | INTRAVENOUS | Status: DC
  Filled 2012-10-06: qty 4

## 2012-10-06 MED ORDER — PLASMA-LYTE 148 IV SOLN
INTRAVENOUS | Status: AC
  Administered 2012-10-07: 15:00:00
  Filled 2012-10-06: qty 2.5

## 2012-10-06 MED ORDER — DEXMEDETOMIDINE HCL IN NACL 400 MCG/100ML IV SOLN
0.1000 ug/kg/h | INTRAVENOUS | Status: AC
  Administered 2012-10-07: 0.3 ug/kg/h via INTRAVENOUS
  Filled 2012-10-06: qty 100

## 2012-10-06 MED ORDER — BISACODYL 5 MG PO TBEC
5.0000 mg | DELAYED_RELEASE_TABLET | Freq: Once | ORAL | Status: AC
  Administered 2012-10-06: 5 mg via ORAL
  Filled 2012-10-06: qty 1

## 2012-10-06 MED ORDER — CHLORHEXIDINE GLUCONATE 4 % EX LIQD: 60.0000 mL | Freq: Once | CUTANEOUS | Status: DC

## 2012-10-06 MED ORDER — SODIUM CHLORIDE 0.9 % IV SOLN
INTRAVENOUS | Status: AC
  Administered 2012-10-07: 1.8 [IU]/h via INTRAVENOUS
  Filled 2012-10-06: qty 1

## 2012-10-06 MED ORDER — DEXTROSE 5 % IV SOLN
1.5000 g | INTRAVENOUS | Status: AC
  Administered 2012-10-07: .75 g via INTRAVENOUS
  Administered 2012-10-07: 1.5 g via INTRAVENOUS
  Filled 2012-10-06 (×2): qty 1.5

## 2012-10-06 MED ORDER — NITROGLYCERIN IN D5W 200-5 MCG/ML-% IV SOLN
2.0000 ug/min | INTRAVENOUS | Status: AC
  Administered 2012-10-07: 5 ug/min via INTRAVENOUS
  Filled 2012-10-06: qty 250

## 2012-10-06 MED ORDER — MAGNESIUM SULFATE 50 % IJ SOLN
40.0000 meq | INTRAMUSCULAR | Status: DC
  Filled 2012-10-06: qty 10

## 2012-10-06 MED ORDER — CHLORHEXIDINE GLUCONATE 4 % EX LIQD
60.0000 mL | Freq: Once | CUTANEOUS | Status: DC
  Filled 2012-10-06: qty 15
  Filled 2012-10-06: qty 45
  Filled 2012-10-06: qty 15

## 2012-10-06 MED ORDER — SODIUM CHLORIDE 0.9 % IV SOLN
INTRAVENOUS | Status: AC
  Administered 2012-10-07: 69.8 mL/h via INTRAVENOUS
  Filled 2012-10-06: qty 40

## 2012-10-06 NOTE — Progress Notes (Signed)
SUBJECTIVE:  No chest pain.  No SOB.    PHYSICAL EXAM Filed Vitals:   10/06/12 0545 10/06/12 0600 10/06/12 0615 10/06/12 0630  BP:      Pulse:      Temp:      TempSrc:      Resp: 14 12 14 9   Height:      Weight:      SpO2:       General:  No distress Lungs:  Clear  Heart:  RRR, no murmur Abdomen:  Positive bowel sounds, no rebound no guarding Extremities:  Right wrist with small blood clots at the sight of previous radial sticks, right groin OK.    LABS:  Results for orders placed during the hospital encounter of 10/02/12 (from the past 24 hour(s))  GLUCOSE, CAPILLARY     Status: Abnormal   Collection Time    10/05/12  7:47 AM      Result Value Range   Glucose-Capillary 130 (*) 70 - 99 mg/dL  GLUCOSE, CAPILLARY     Status: Abnormal   Collection Time    10/05/12 12:20 PM      Result Value Range   Glucose-Capillary 100 (*) 70 - 99 mg/dL  GLUCOSE, CAPILLARY     Status: Abnormal   Collection Time    10/05/12  4:36 PM      Result Value Range   Glucose-Capillary 127 (*) 70 - 99 mg/dL  GLUCOSE, CAPILLARY     Status: Abnormal   Collection Time    10/05/12  9:41 PM      Result Value Range   Glucose-Capillary 153 (*) 70 - 99 mg/dL  CBC     Status: Abnormal   Collection Time    10/06/12  5:54 AM      Result Value Range   WBC 6.8  4.0 - 10.5 K/uL   RBC 4.86  4.22 - 5.81 MIL/uL   Hemoglobin 13.9  13.0 - 17.0 g/dL   HCT 16.1  09.6 - 04.5 %   MCV 85.8  78.0 - 100.0 fL   MCH 28.6  26.0 - 34.0 pg   MCHC 33.3  30.0 - 36.0 g/dL   RDW 40.9  81.1 - 91.4 %   Platelets 107 (*) 150 - 400 K/uL  HEPARIN LEVEL (UNFRACTIONATED)     Status: Abnormal   Collection Time    10/06/12  5:54 AM      Result Value Range   Heparin Unfractionated 0.80 (*) 0.30 - 0.70 IU/mL  BASIC METABOLIC PANEL     Status: Abnormal   Collection Time    10/06/12  5:54 AM      Result Value Range   Sodium 141  135 - 145 mEq/L   Potassium 4.9  3.5 - 5.1 mEq/L   Chloride 107  96 - 112 mEq/L   CO2 28  19 -  32 mEq/L   Glucose, Bld 131 (*) 70 - 99 mg/dL   BUN 15  6 - 23 mg/dL   Creatinine, Ser 7.82  0.50 - 1.35 mg/dL   Calcium 9.4  8.4 - 95.6 mg/dL   GFR calc non Af Amer 68 (*) >90 mL/min   GFR calc Af Amer 79 (*) >90 mL/min    Intake/Output Summary (Last 24 hours) at 10/06/12 0730 Last data filed at 10/06/12 0600  Gross per 24 hour  Intake   2362 ml  Output   1700 ml  Net    662 ml    ASSESSMENT  AND PLAN:  CAD:  For CABG in the AM. Pre CABG Dopplers and PFTs ordered.    Thrombocytopenia:  This has been chronic and stable.    AAA:  No imaging in 2 years.  I will order a Doppler.   Fayrene Fearing [facility] 10/06/2012 7:30 AM

## 2012-10-06 NOTE — Progress Notes (Signed)
ANTICOAGULATION CONSULT NOTE - Follow Up Consult  Pharmacy Consult for heparin Indication: chest pain/ACS  Allergies  Allergen Reactions  . Meperidine Hcl Other (See Comments)    Was mixed with phenergan and turned blood vessels red.   . Promethazine Hcl Other (See Comments)    Was mixed with the Demerol and caused veins to turn red.     Patient Measurements: Height: 5\' 11"  (180.3 cm) Weight: 169 lb 8.5 oz (76.9 kg) IBW/kg (Calculated) : 75.3  Vital Signs: Temp: 97.5 F (36.4 C) (04/06 2352) Temp src: Oral (04/06 2352) BP: 109/66 mmHg (04/06 2352)  Labs:  Recent Labs  10/03/12 1100  10/04/12 0515  10/04/12 2353 10/05/12 0446 10/06/12 0554  HGB  --   < > 14.0  --   --  14.0 13.9  HCT  --   --  41.4  --   --  41.1 41.7  PLT  --   --  99*  --   --  102* 107*  HEPARINUNFRC  --   --   --   < > 0.55 0.64 0.80*  CREATININE  --   --  1.05  --   --   --   --   TROPONINI <0.30  --   --   --   --   --   --   < > = values in this interval not displayed.  Estimated Creatinine Clearance: 80.7 ml/min (by C-G formula based on Cr of 1.05).  Assessment: Mr. Grounds presented with chest pain, he is now s/p LHC which revealed severe 2-vessel disease.   Heparin level (0.8) is above-goal on 1400 units/hr.  No bleeding per RN. Planned for CABG on Tuesday.   Goal of Therapy:  Heparin level 0.3-0.7 units/ml Monitor platelets by anticoagulation protocol: Yes   Plan:  1. Decrease IV heparin to 1300 units/hr.  2. Heparin level in 6 hours.   Lorre Munroe, PharmD 10/06/2012 6:26 AM

## 2012-10-06 NOTE — Progress Notes (Signed)
[id number] Cardiac Rehab Completed pre op education with pt and answered his questions. He voices understanding. Discussed sternal precautions, use of IS and ambulation. Melina Copa RN

## 2012-10-06 NOTE — Progress Notes (Signed)
VASCULAR LAB PRELIMINARY  PRELIMINARY  PRELIMINARY  PRELIMINARY  Pre-op Cardiac Surgery  Carotid Findings:  Within normal limits   Upper Extremity Right Left  Brachial Pressures 127 Triphasic  127 Within normal limits   Radial Waveforms Triphasic  Triphasic   Ulnar Waveforms Triphasic  Triphasic   Palmar Arch (Allen's Test) Doppler normal with radial and obliterates with ulnar compression. Doppler normal with radial and obliterates with ulnar compression.   Findings:      Lower  Extremity Right Left  Dorsalis Pedis    Anterior Tibial 130 Biphasic  127 Biphasic   Posterior Tibial 152 Biphasic  143 Biphasic   Ankle/Brachial Indices     >1   >1      Findings:    Sherren Kerns, RVS Reighlyn Elmes, RVT 10/06/2012, 4:13 PM

## 2012-10-06 NOTE — Consult Note (Signed)
CARDIOTHORACIC SURGERY CONSULTATION REPORT  PCP is Colette Ribas, MD Referring Provider is Rollene Rotunda, MD   Reason for consultation:  Coronary artery disease  HPI:  Patient is a 62 year old male with known history of coronary artery disease, hyperlipidemia, type 2 diabetes mellitus, and ongoing tobacco abuse who originally presented in 2009 with acute coronary syndrome at which time he was treated with PCI and stenting of the left anterior descending coronary artery. He underwent PCI and stenting of the right coronary artery using a bare metal stent in 2009. Since then he has continued to do well until 3 days ago when he developed sudden onset of sharp substernal chest pain while he was playing golf. The severe sharp pain resolved quickly but the patient had persistent vague mild chest discomfort that ultimately prompted him to present to the emergency department later that afternoon. In the emergency department his chest pain was improved with nitroglycerin. Baseline EKG and cardiac enzymes were normal.  Patient subsequently underwent cardiac catheterization on April 4 demonstrating severe two-vessel coronary artery disease with essentially the equivalent of left main disease including high-grade ostial stenosis of both the left anterior ascending coronary artery and the left circumflex coronary artery. Left ventricular function is preserved. Followup transthoracic echocardiogram also demonstrates normal left ventricular function. The patient has remained clinically stable on medical therapy, and cardiothoracic surgical consultation has been requested.  The patient states that prior to April 3 he had not had any recent episodes of severe chest pain as which brought him to the emergency room. However, he does note that beginning back in January he had some other dull chest pain located in the left lower chest. He was evaluated in January and thought perhaps to be suffering from  ulcer disease. He underwent upper GI endoscopy confirming with the patient reports to be an ulcer in the stomach. Symptoms seem to improve with medical therapy, although the patient reports that he has had some recurrence of similar dull chest pain in the left chest over the last few weeks. He denies any shortness of breath either with activity or at rest. He denies any PND, orthopnea, or lower extremity edema. He has never had any palpitations or syncope.  Past Medical History  Diagnosis Date  . ASCVD (arteriosclerotic cardiovascular disease)     LAD stent in 1999; RCA bare-metal stent in 12/2007, LAD stent patent, 50-60% proximal Cx lesion treated medically  . AAA (abdominal aortic aneurysm)     3.3 cm in 2012  . Hyperlipidemia   . Diabetes mellitus   . Thrombocytopenia     Chronic; evaluated by Dr. Mariel Sleet in 2007  . Tobacco abuse   . Hepatic steatosis   . ED (erectile dysfunction)   . Nephrolithiasis   . Anxiety   . Depression   . Insomnia   . Gastric ulcer   . Coronary artery disease   . MI (myocardial infarction)   . THROMBOCYTOPENIA 08/09/2010    Platelets 88-114,000 over the past 4 years; prior evaluation by Dr. Mariel Sleet without a specific etiology identified.  01/2012: Normal CBC ex platelets-120   . ABDOMINAL AORTIC ANEURYSM 11/07/2009    Diameter of 3.3 cm by CT in 12/2010   . NEPHROLITHIASIS, RECURRENT 08/09/2010    Obstruction of the ureter on CT scan of the abdomen resulting in right hydronephrosis in 12/2010; bladder outlet obstruction prior to that.  CT also revealed atherosclerotic changes in the abdominal arterial vessels, hepatic granuloma and  calcification of the prostate.   Marland Kitchen HYPERLIPIDEMIA 08/09/2010    Lipid profile in 07/2010:144, 124, 34, 86; 01/2012:93, 82, 34, 43   . DIABETES MELLITUS, TYPE II 08/09/2010    no insulin      Past Surgical History  Procedure Laterality Date  . Lithotripsy    . Inguinal hernia repair    . Coronary angioplasty with stent placement       1999, 2009  . Esophagogastroduodenoscopy  04/04/2012    Procedure: ESOPHAGOGASTRODUODENOSCOPY (EGD);  Surgeon: Malissa Hippo, MD;  Location: [facility];  Service: Endoscopy;  Laterality: N/A;  830  . Esophagogastroduodenoscopy  07/25/2012    Procedure: ESOPHAGOGASTRODUODENOSCOPY (EGD);  Surgeon: Malissa Hippo, MD;  Location: [facility];  Service: Endoscopy;  Laterality: N/A;  830    Family History  Problem Relation Age of Onset  . Ovarian cancer Sister     History   Social History  . Marital Status: Married    Spouse Name: N/A    Number of Children: N/A  . Years of Education: N/A   Occupational History  . Not on file.   Social History Main Topics  . Smoking status: Current Every Day Smoker -- 0.50 packs/day for 40 years    Types: Cigarettes  . Smokeless tobacco: Not on file  . Alcohol Use: No  . Drug Use: No  . Sexually Active: Not on file   Other Topics Concern  . Not on file   Social History Narrative   No regular exercise    Prior to Admission medications   Medication Sig Start Date End Date Taking? Authorizing Provider  ALPRAZolam Prudy Feeler) 0.5 MG tablet Take 0.5 mg by mouth at bedtime as needed. Sleep   Yes Historical Provider, MD  amLODipine (NORVASC) 2.5 MG tablet Take 2.5 mg by mouth daily.     Yes Historical Provider, MD  aspirin EC 81 MG tablet Take 81 mg by mouth daily.   Yes Historical Provider, MD  atorvastatin (LIPITOR) 20 MG tablet Take 20 mg by mouth daily.   Yes Historical Provider, MD  fish oil-omega-3 fatty acids 1000 MG capsule Take 1 capsule by mouth 2 (two) times daily.     Yes Historical Provider, MD  Linagliptin-Metformin HCl (JENTADUETO) 2.5-500 MG TABS Take 2 tablets by mouth 2 (two) times daily.    Yes Historical Provider, MD  metoprolol succinate (TOPROL-XL) 50 MG 24 hr tablet Take 50 mg by mouth daily. Take with or immediately following a meal.   Yes Historical Provider, MD  pantoprazole (PROTONIX) 20 MG tablet Take 20 mg by mouth  daily.  03/25/12  Yes Ward Givens, MD  Potassium Citrate (UROCIT-K 15) 15 MEQ (1620 MG) TBCR Take 1 tablet by mouth 2 (two) times daily.   Yes Historical Provider, MD  ramipril (ALTACE) 10 MG tablet Take 10 mg by mouth daily.     Yes Historical Provider, MD  tadalafil (CIALIS) 10 MG tablet Take 10 mg by mouth daily as needed.   Yes Historical Provider, MD  zolpidem (AMBIEN) 10 MG tablet Take 10 mg by mouth at bedtime as needed. Sleep   Yes Historical Provider, MD    Current Facility-Administered Medications  Medication Dose Route Frequency Provider Last Rate Last Dose  . 0.9 %  sodium chloride infusion   Intravenous Continuous Eduard Clos, MD 10 mL/hr at 10/05/12 2000    . acetaminophen (TYLENOL) tablet 650 mg  650 mg Oral Q6H PRN Eduard Clos, MD  650 mg at 10/05/12 2150   Or  . acetaminophen (TYLENOL) suppository 650 mg  650 mg Rectal Q6H PRN Eduard Clos, MD      . ALPRAZolam Prudy Feeler) tablet 0.5 mg  0.5 mg Oral QHS PRN Eduard Clos, MD      . amLODipine (NORVASC) tablet 2.5 mg  2.5 mg Oral Daily Eduard Clos, MD   2.5 mg at 10/05/12 1549  . aspirin EC tablet 325 mg  325 mg Oral Daily Eduard Clos, MD   325 mg at 10/05/12 1100  . atorvastatin (LIPITOR) tablet 80 mg  80 mg Oral q1800 Leeann Must, MD   80 mg at 10/05/12 1802  . heparin ADULT infusion 100 units/mL (25000 units/250 mL)  1,300 Units/hr Intravenous Continuous Lonia Blood, MD 13 mL/hr at 10/06/12 0656 1,300 Units/hr at 10/06/12 0656  . insulin aspart (novoLOG) injection 0-9 Units  0-9 Units Subcutaneous TID WC Eduard Clos, MD   1 Units at 10/06/12 0800  . metoprolol succinate (TOPROL-XL) 24 hr tablet 50 mg  50 mg Oral Daily Eduard Clos, MD   50 mg at 10/05/12 1100  . nitroGLYCERIN (NITRODUR - Dosed in mg/24 hr) patch 0.4 mg  0.4 mg Transdermal Daily Eduard Clos, MD   0.4 mg at 10/05/12 1100  . ondansetron (ZOFRAN) tablet 4 mg  4 mg Oral Q6H PRN Eduard Clos, MD       Or  . ondansetron [facility]) injection 4 mg  4 mg Intravenous Q6H PRN Eduard Clos, MD      . pantoprazole (PROTONIX) EC tablet 20 mg  20 mg Oral Daily Eduard Clos, MD   20 mg at 10/05/12 1100  . ramipril (ALTACE) capsule 10 mg  10 mg Oral Daily Eduard Clos, MD   10 mg at 10/05/12 1100  . zolpidem (AMBIEN) tablet 10 mg  10 mg Oral QHS PRN Eduard Clos, MD        Allergies  Allergen Reactions  . Meperidine Hcl Other (See Comments)    Was mixed with phenergan and turned blood vessels red.   . Promethazine Hcl Other (See Comments)    Was mixed with the Demerol and caused veins to turn red.       Review of Systems:   General:  normal appetite, normal energy, no weight gain, no weight loss, no fever  Cardiac:  + chest pain with exertion, no chest pain at rest, no SOB with exertion, no resting SOB, no PND, no orthopnea, no palpitations, no arrhythmia, no atrial fibrillation, no LE edema, no dizzy spells, no syncope  Respiratory:  no shortness of breath, no home oxygen, no productive cough, no dry cough, no bronchitis, no wheezing, no hemoptysis, no asthma, no pain with inspiration or cough, no sleep apnea, no CPAP at night  GI:   no difficulty swallowing, no reflux, no frequent heartburn, no hiatal hernia, + abdominal pain, + constipation, no diarrhea, no hematochezia, no hematemesis, no melena  GU:   no dysuria,  no frequency, no urinary tract infection, no hematuria, no enlarged prostate, + kidney stones, no kidney disease  Vascular:  no pain suggestive of claudication, no pain in feet, no leg cramps, no varicose veins, no DVT, no non-healing foot ulcer  Neuro:   no stroke, no TIA's, no seizures, no headaches, no temporary blindness one eye,  no slurred speech, no peripheral neuropathy, no chronic pain, no instability of gait, no memory/cognitive dysfunction  Musculoskeletal: + arthritis in right hand, no joint swelling, no myalgias, no  difficulty walking, normal mobility   Skin:   no rash, no itching, no skin infections, no pressure sores or ulcerations  Psych:   no anxiety, no depression, no nervousness, no unusual recent stress  Eyes:   no blurry vision, no floaters, no recent vision changes,  wears glasses or contacts  ENT:   no hearing loss, no loose or painful teeth, no dentures  Hematologic:  no easy bruising, no abnormal bleeding, no clotting disorder, no frequent epistaxis  Endocrine:  + diabetes, checks CBG's at home     Physical Exam:   BP 135/75  Pulse 57  Temp(Src) 98.2 F (36.8 C) (Oral)  Resp 18  Ht 5\' 11"  (1.803 m)  Wt 76.9 kg (169 lb 8.5 oz)  BMI 23.66 kg/m2  SpO2 96%  General:    well-appearing  HEENT:  Unremarkable   Neck:   no JVD, no bruits, no adenopathy   Chest:   clear to auscultation, symmetrical breath sounds, no wheezes, no rhonchi   CV:   RRR, no  murmur   Abdomen:  soft, non-tender, no masses   Extremities:  warm, well-perfused, pulses palpable  Rectal/GU  Deferred  Neuro:   Grossly non-focal and symmetrical throughout  Skin:   Clean and dry, no rashes, no breakdown  Diagnostic Tests:  Transthoracic Echocardiography  Patient: [name], [place] MR #: [id number] Study Date: 10/05/2012 Gender: M Age: 27 Height: 180.3cm Weight: 76.9kg BSA: 1.62m^2 Pt. Status: Room: 2924  PERFORMING [place], [facility] [place] SONOGRAPHER Christy Little, RCS cc:  ------------------------------------------------------------ LV EF: 55% - 60%  ------------------------------------------------------------ Indications: CAD of native vessels 414.01. Screening pre operative V72.81.  ------------------------------------------------------------ History: PMH: Chest pain. Risk factors: Current tobacco use. Diabetes mellitus. Dyslipidemia.  ------------------------------------------------------------ Study Conclusions  Left ventricle: The cavity size was normal. Wall  thickness was normal. Systolic function was normal. The estimated ejection fraction was in the range of 55% to 60%. Wall motion was normal; there were no regional wall motion abnormalities. Transthoracic echocardiography. M-mode, complete 2D, spectral Doppler, and color Doppler. Height: Height: 180.3cm. Height: 71in. Weight: Weight: 76.9kg. Weight: 169.2lb. Body mass index: BMI: 23.6kg/m^2. Body surface area: BSA: 1.8m^2. Blood pressure: 117/76. Patient status: Inpatient. Location: ICU/CCU  ------------------------------------------------------------  ------------------------------------------------------------ Left ventricle: The cavity size was normal. Wall thickness was normal. Systolic function was normal. The estimated ejection fraction was in the range of 55% to 60%. Wall motion was normal; there were no regional wall motion abnormalities.  ------------------------------------------------------------ Aortic valve: Structurally normal valve. Cusp separation was normal. Doppler: Transvalvular velocity was within the normal range. There was no stenosis. No regurgitation.  ------------------------------------------------------------ Aorta: Aortic root: The aortic root was normal in size. Ascending aorta: The ascending aorta was normal in size.  ------------------------------------------------------------ Mitral valve: Structurally normal valve. Leaflet separation was normal. Doppler: Transvalvular velocity was within the normal range. There was no evidence for stenosis. No regurgitation.  ------------------------------------------------------------ Left atrium: The atrium was normal in size.  ------------------------------------------------------------ Right ventricle: The cavity size was normal. Systolic function was normal.  ------------------------------------------------------------ Pulmonic valve: Doppler: No significant  regurgitation.  ------------------------------------------------------------ Tricuspid valve: Doppler: No significant regurgitation.  ------------------------------------------------------------ Right atrium: The atrium was normal in size.  ------------------------------------------------------------ Pericardium: There was no pericardial effusion.  ------------------------------------------------------------ Systemic veins: Inferior vena cava: The vessel was normal in size; the respirophasic diameter changes were in the normal range (= 50%); findings are consistent with normal central venous pressure.  ------------------------------------------------------------  2D  measurements Normal Doppler measurements Normal Left ventricle Left ventricle LVID ED, 49 mm 43-52 Ea, lat ann, 11. cm/s ------ chord, tiss DP 7 PLAX E/Ea, lat 5.5 ------ LVID ES, 29.2 mm 23-38 ann, tiss DP 3 chord, Ea, med ann, 9.3 cm/s ------ PLAX tiss DP 2 FS, chord, 40 % >29 E/Ea, med 6.9 ------ PLAX ann, tiss DP 4 LVPW, ED 8.46 mm ------ Mitral valve IVS/LVPW 1.02 <1.3 Peak E vel 64. cm/s ------ ratio, ED 7 Ventricular septum Peak A vel 51. cm/s ------ IVS, ED 8.61 mm ------ 8 Aorta Deceleration 183 ms 150-23 Root diam, 34 mm ------ time 0 ED Peak E/A 1.2 ------ Left atrium ratio AP dim 34 mm ------ Right ventricle AP dim 1.73 cm/m^2 <2.2 Sa vel, lat 13. cm/s ------ index ann, tiss DP 2  ------------------------------------------------------------ Prepared and Electronically Authenticated by  Kristeen Miss 2014-04-06T13:[id number]   Cardiac Catheterization Procedure Note  Name: DE JAWORSKI  MRN: [id number]  DOB: March 25, 1951  Procedure: Left Heart Cath, Selective Coronary Angiography, LV angiography  Indication: Unstable angina. Previous stenting of the RCA 2009.  Procedural details: The right groin was prepped, draped, and anesthetized with 1% lidocaine. Using modified Seldinger technique, a  5 French sheath was introduced into the right femoral artery. Standard Judkins catheters were used for coronary angiography and left ventriculography. Catheter exchanges were performed over a guidewire. There were no immediate procedural complications. The patient was transferred to the post catheterization recovery area for further monitoring. I was attempting a radial cath but I was unable to cannulate the radial vessel.  Procedural Findings:  Hemodynamics:  AO 99/60  LV 92/5  Coronary angiography:  Coronary dominance: Right  Left mainstem: LM distal 60% with plaque extending into the LAD/Circ  Left anterior descending (LAD): Ostial calcification with 80% stenosis. Mid diagonal large and normal.  Left circumflex (LCx): AV groove ostial 95% stenosis. Moderately severe calcification. RI large with proximal calcifcation. Long proximal 25%. MOM large with long proximal 25%.  Right coronary artery (RCA): Proximal stent widely patent. Diffuse 25% lesions with moderate calcification.  Left ventriculography: LVEF is estimated at 55%, there is no significant mitral regurgitation . There is mild inferior hypokinesis.  Final Conclusions: Severe two vessel CAD with a preserved EF.  Recommendations: Plan CABG.  Rollene Rotunda  10/03/2012, 4:07 PM      Impression:  Severe two-vessel coronary artery disease with left main equivalent and recent onset chest pain consistent with unstable angina. Left ventricular systolic function is preserved. I agree the patient would best be treated with surgical revascularization.    Plan:  I spent in excess of 30 minutes discussing the indications, risks, and potential benefits of surgery with the patient this morning. Alternative treatment strategies been discussed. The patient understands and accepts all potential associated risks of surgery including but not limited to risk of death, stroke, myocardial infarction, congestive heart failure, respiratory failure, renal  failure, pneumonia, bleeding requiring blood transfusion, arrhythmia, infection, late recurrence of symptomatic coronary artery disease. The importance of the need for the patient to quit smoking has been discussed in emphasized. The importance that the patient continue to keep benign on long-term management of his elevated cholesterol and diabetes is also been stressed. All of his questions been addressed. We plan to proceed with surgery tomorrow.    Salvatore Decent. Cornelius Moras, MD 10/06/2012 9:40 AM  I spent in excess of 45 minutes of time directly involved in the conduct of this consultation.

## 2012-10-06 NOTE — Progress Notes (Signed)
ANTICOAGULATION CONSULT NOTE - Follow Up Consult  Pharmacy Consult for Heparin Indication: CAD  Allergies  Allergen Reactions  . Meperidine Hcl Other (See Comments)    Was mixed with phenergan and turned blood vessels red.   . Promethazine Hcl Other (See Comments)    Was mixed with the Demerol and caused veins to turn red.     Patient Measurements: Height: 5\' 11"  (180.3 cm) Weight: 169 lb 8.5 oz (76.9 kg) IBW/kg (Calculated) : 75.3 Heparin Dosing Weight:   Vital Signs: Temp: 98.2 F (36.8 C) (04/07 1133) Temp src: Oral (04/07 1133) BP: 140/78 mmHg (04/07 0941) Pulse Rate: 70 (04/07 1133)  Labs:  Recent Labs  10/04/12 0515  10/05/12 0446 10/06/12 0554 10/06/12 1144  HGB 14.0  --  14.0 13.9  --   HCT 41.4  --  41.1 41.7  --   PLT 99*  --  102* 107*  --   HEPARINUNFRC  --   < > 0.64 0.80* 0.70  CREATININE 1.05  --   --  1.15  --   < > = values in this interval not displayed.  Estimated Creatinine Clearance: 73.7 ml/min (by C-G formula based on Cr of 1.15).  Assessment: CP  PMH: CAD w/ stent, AAA, HLP, DM, nephrolithiasis, depression, insomnia,   Anticoagulation: Heparin for 2 vessel severe CAD, CABG 4/8. Heparin level back in goal range having dropped from 0.8 to 0.7 after dose adjustment. Hgb stable. Platelets low but stable.  Infectious Disease: afeb, WBC 6.8  Cardiovascular: h/o CAD with PCI to LAD, RCA, AAA, HLD,  140/78, HR 70 on ASA 325mg , norvasc 2.5mg  ,Toprol XL, Lipitor, NTG patch,  Ramipril. Lipid panel with low HDL  Endocrinology: T2DM- CBGs 100-153 on SSI  Gastrointestinal / Nutrition: PPI, h/o hepatic steatosis, h/o gastric ulcer,   Neurology: on xanax, ambien for anxiety/depression  Nephrology: Scr 1.15  Pulmonary: ongoing tobacco  Hematology / Oncology: CBC stable, chronic known thrombocytopenia  PTA Medication Issues: Lovaza on hold, Linagliptin/metformin, Urocit K  Best Practices: hep   Goal of Therapy:  Heparin level 0.3-0.7  units/ml Monitor platelets by anticoagulation protocol: Yes   Plan:  Continue IV heparin at 1300 units/hr. Off at 0600 tomorrow am  Pasty Spillers 10/06/2012,1:34 PM

## 2012-10-07 ENCOUNTER — Encounter (HOSPITAL_COMMUNITY): Payer: Self-pay | Admitting: Certified Registered Nurse Anesthetist

## 2012-10-07 ENCOUNTER — Inpatient Hospital Stay (HOSPITAL_COMMUNITY): Payer: PRIVATE HEALTH INSURANCE

## 2012-10-07 ENCOUNTER — Encounter (HOSPITAL_COMMUNITY)
Admission: EM | Disposition: A | Discharge status: Home / Self Care | Payer: Self-pay | Source: Home / Self Care | Attending: Thoracic Surgery (Cardiothoracic Vascular Surgery)

## 2012-10-07 ENCOUNTER — Inpatient Hospital Stay (HOSPITAL_COMMUNITY): Payer: PRIVATE HEALTH INSURANCE | Admitting: Anesthesiology

## 2012-10-07 ENCOUNTER — Encounter (HOSPITAL_COMMUNITY): Payer: Self-pay | Admitting: Anesthesiology

## 2012-10-07 DIAGNOSIS — Z951 Presence of aortocoronary bypass graft: Secondary | ICD-10-CM

## 2012-10-07 DIAGNOSIS — I2510 Atherosclerotic heart disease of native coronary artery without angina pectoris: Secondary | ICD-10-CM

## 2012-10-07 HISTORY — PX: CORONARY ARTERY BYPASS GRAFT: SHX141

## 2012-10-07 HISTORY — DX: Presence of aortocoronary bypass graft: Z95.1

## 2012-10-07 HISTORY — PX: INTRAOPERATIVE TRANSESOPHAGEAL ECHOCARDIOGRAM: SHX5062

## 2012-10-07 LAB — POCT I-STAT 4, (NA,K, GLUC, HGB,HCT)
Glucose, Bld: 119 mg/dL — ABNORMAL HIGH (ref 70–99)
Glucose, Bld: 103 mg/dL — ABNORMAL HIGH (ref 70–99)
Glucose, Bld: 101 mg/dL — ABNORMAL HIGH (ref 70–99)
Glucose, Bld: 113 mg/dL — ABNORMAL HIGH (ref 70–99)
HCT: 36 % — ABNORMAL LOW (ref 39.0–52.0)
HCT: 28 % — ABNORMAL LOW (ref 39.0–52.0)
HCT: 26 % — ABNORMAL LOW (ref 39.0–52.0)
Hemoglobin: 12.2 g/dL — ABNORMAL LOW (ref 13.0–17.0)
Hemoglobin: 9.5 g/dL — ABNORMAL LOW (ref 13.0–17.0)
Hemoglobin: 10.2 g/dL — ABNORMAL LOW (ref 13.0–17.0)
Potassium: 4 mEq/L (ref 3.5–5.1)
Potassium: 4.1 mEq/L (ref 3.5–5.1)
Potassium: 5.3 mEq/L — ABNORMAL HIGH (ref 3.5–5.1)
Potassium: 4.1 mEq/L (ref 3.5–5.1)
Sodium: 141 mEq/L (ref 135–145)
Sodium: 134 mEq/L — ABNORMAL LOW (ref 135–145)
Sodium: 140 mEq/L (ref 135–145)

## 2012-10-07 LAB — CBC
HCT: 45.2 % (ref 39.0–52.0)
Hemoglobin: 11.6 g/dL — ABNORMAL LOW (ref 13.0–17.0)
MCV: 85.3 fL (ref 78.0–100.0)
RBC: 3.98 MIL/uL — ABNORMAL LOW (ref 4.22–5.81)
RDW: 13.7 % (ref 11.5–15.5)
WBC: 6.8 10*3/uL (ref 4.0–10.5)
WBC: 9 10*3/uL (ref 4.0–10.5)

## 2012-10-07 LAB — GLUCOSE, CAPILLARY
Glucose-Capillary: 126 mg/dL — ABNORMAL HIGH (ref 70–99)
Glucose-Capillary: 128 mg/dL — ABNORMAL HIGH (ref 70–99)
Glucose-Capillary: 101 mg/dL — ABNORMAL HIGH (ref 70–99)
Glucose-Capillary: 94 mg/dL (ref 70–99)
Glucose-Capillary: 95 mg/dL (ref 70–99)
Glucose-Capillary: 105 mg/dL — ABNORMAL HIGH (ref 70–99)

## 2012-10-07 LAB — PROTIME-INR
INR: 1.38 (ref 0.00–1.49)
Prothrombin Time: 16.6 seconds — ABNORMAL HIGH (ref 11.6–15.2)

## 2012-10-07 LAB — POCT I-STAT 3, ART BLOOD GAS (G3+)
Acid-Base Excess: 1 mmol/L (ref 0.0–2.0)
Bicarbonate: 23.6 mEq/L (ref 20.0–24.0)
Bicarbonate: 22.7 mEq/L (ref 20.0–24.0)
O2 Saturation: 100 %
Patient temperature: 35.7
TCO2: 24 mmol/L (ref 0–100)
pCO2 arterial: 41.9 mmHg (ref 35.0–45.0)
pH, Arterial: 7.361 (ref 7.350–7.450)
pH, Arterial: 7.49 — ABNORMAL HIGH (ref 7.350–7.450)
pH, Arterial: 7.378 (ref 7.350–7.450)
pO2, Arterial: 223 mmHg — ABNORMAL HIGH (ref 80.0–100.0)
pO2, Arterial: 72 mmHg — ABNORMAL LOW (ref 80.0–100.0)

## 2012-10-07 LAB — BASIC METABOLIC PANEL
BUN: 16 mg/dL (ref 6–23)
Chloride: 105 mEq/L (ref 96–112)
GFR calc non Af Amer: 63 mL/min — ABNORMAL LOW (ref >90)
Glucose, Bld: 121 mg/dL — ABNORMAL HIGH (ref 70–99)
Potassium: 4 mEq/L (ref 3.5–5.1)

## 2012-10-07 LAB — APTT: aPTT: 36 seconds (ref 24–37)

## 2012-10-07 LAB — HEMOGLOBIN AND HEMATOCRIT, BLOOD: HCT: 31 % — ABNORMAL LOW (ref 39.0–52.0)

## 2012-10-07 SURGERY — CORONARY ARTERY BYPASS GRAFTING (CABG)
Anesthesia: General | Site: Chest | Wound class: Clean

## 2012-10-07 MED ORDER — KETOROLAC TROMETHAMINE 30 MG/ML IJ SOLN: 30.0000 mg | Freq: Once | INTRAMUSCULAR | Status: AC | PRN

## 2012-10-07 MED ORDER — METOPROLOL TARTRATE 25 MG/10 ML ORAL SUSPENSION
12.5000 mg | Freq: Two times a day (BID) | ORAL | Status: DC
  Filled 2012-10-07 (×3): qty 5

## 2012-10-07 MED ORDER — INSULIN REGULAR BOLUS VIA INFUSION
0.0000 [IU] | Freq: Three times a day (TID) | INTRAVENOUS | Status: AC
  Filled 2012-10-07: qty 10

## 2012-10-07 MED ORDER — LIDOCAINE HCL (CARDIAC) 20 MG/ML IV SOLN
INTRAVENOUS | Status: DC | PRN
  Administered 2012-10-07: 40 mg via INTRAVENOUS

## 2012-10-07 MED ORDER — SODIUM CHLORIDE 0.9 % IJ SOLN
3.0000 mL | Freq: Two times a day (BID) | INTRAMUSCULAR | Status: DC
  Administered 2012-10-08 (×2): 3 mL via INTRAVENOUS

## 2012-10-07 MED ORDER — VECURONIUM BROMIDE 10 MG IV SOLR
INTRAVENOUS | Status: DC | PRN
  Administered 2012-10-07 (×2): 10 mg via INTRAVENOUS

## 2012-10-07 MED ORDER — MIDAZOLAM HCL 5 MG/5ML IJ SOLN
INTRAMUSCULAR | Status: DC | PRN
  Administered 2012-10-07: 2 mg via INTRAVENOUS
  Administered 2012-10-07: 6 mg via INTRAVENOUS
  Administered 2012-10-07: 2 mg via INTRAVENOUS

## 2012-10-07 MED ORDER — METOPROLOL TARTRATE 12.5 MG HALF TABLET
12.5000 mg | ORAL_TABLET | Freq: Two times a day (BID) | ORAL | Status: DC
  Administered 2012-10-08: 12.5 mg via ORAL
  Filled 2012-10-07 (×5): qty 1

## 2012-10-07 MED ORDER — DEXMEDETOMIDINE HCL IN NACL 400 MCG/100ML IV SOLN
0.4000 ug/kg/h | INTRAVENOUS | Status: DC
  Filled 2012-10-07: qty 100

## 2012-10-07 MED ORDER — NITROGLYCERIN IN D5W 200-5 MCG/ML-% IV SOLN
0.0000 ug/min | INTRAVENOUS | Status: DC
  Administered 2012-10-07: 20 ug/min via INTRAVENOUS

## 2012-10-07 MED ORDER — LACTATED RINGERS IV SOLN: INTRAVENOUS | Status: DC

## 2012-10-07 MED ORDER — METOPROLOL TARTRATE 1 MG/ML IV SOLN: 2.5000 mg | INTRAVENOUS | Status: DC | PRN

## 2012-10-07 MED ORDER — DEXMEDETOMIDINE HCL IN NACL 200 MCG/50ML IV SOLN: 0.1000 ug/kg/h | INTRAVENOUS | Status: DC

## 2012-10-07 MED ORDER — EPHEDRINE SULFATE 50 MG/ML IJ SOLN
INTRAMUSCULAR | Status: DC | PRN
  Administered 2012-10-07: 5 mg via INTRAVENOUS

## 2012-10-07 MED ORDER — HEPARIN (PORCINE) IN NACL 100-0.45 UNIT/ML-% IJ SOLN
INTRAMUSCULAR | Status: AC
  Administered 2012-10-07: 25000 [IU]
  Filled 2012-10-07: qty 250

## 2012-10-07 MED ORDER — SODIUM CHLORIDE 0.9 % IJ SOLN: 3.0000 mL | INTRAMUSCULAR | Status: DC | PRN

## 2012-10-07 MED ORDER — MAGNESIUM SULFATE 40 MG/ML IJ SOLN
INTRAMUSCULAR | Status: AC
  Filled 2012-10-07: qty 100

## 2012-10-07 MED ORDER — SODIUM CHLORIDE 0.9 % IJ SOLN
OROMUCOSAL | Status: DC | PRN
  Administered 2012-10-07: 15:00:00 via TOPICAL

## 2012-10-07 MED ORDER — ALBUMIN HUMAN 5 % IV SOLN
INTRAVENOUS | Status: DC | PRN
  Administered 2012-10-07 (×2): via INTRAVENOUS

## 2012-10-07 MED ORDER — ACETAMINOPHEN 160 MG/5ML PO SOLN: 975.0000 mg | Freq: Four times a day (QID) | ORAL | Status: DC

## 2012-10-07 MED ORDER — PROPOFOL 10 MG/ML IV BOLUS
INTRAVENOUS | Status: DC | PRN
  Administered 2012-10-07: 60 mg via INTRAVENOUS

## 2012-10-07 MED ORDER — LACTATED RINGERS IV SOLN
INTRAVENOUS | Status: DC | PRN
  Administered 2012-10-07 (×2): via INTRAVENOUS

## 2012-10-07 MED ORDER — PANTOPRAZOLE SODIUM 40 MG PO TBEC: 40.0000 mg | DELAYED_RELEASE_TABLET | Freq: Every day | ORAL | Status: DC

## 2012-10-07 MED ORDER — LACTATED RINGERS IV SOLN
INTRAVENOUS | Status: DC | PRN
  Administered 2012-10-07 (×2): via INTRAVENOUS

## 2012-10-07 MED ORDER — PHENYLEPHRINE HCL 10 MG/ML IJ SOLN
0.0000 ug/min | INTRAVENOUS | Status: DC
  Administered 2012-10-08: 10 ug/min via INTRAVENOUS
  Filled 2012-10-07 (×2): qty 2

## 2012-10-07 MED ORDER — ARTIFICIAL TEARS OP OINT
TOPICAL_OINTMENT | OPHTHALMIC | Status: DC | PRN
  Administered 2012-10-07: 1 via OPHTHALMIC

## 2012-10-07 MED ORDER — POTASSIUM CHLORIDE 10 MEQ/50ML IV SOLN: 10.0000 meq | INTRAVENOUS | Status: AC

## 2012-10-07 MED ORDER — BISACODYL 10 MG RE SUPP: 10.0000 mg | Freq: Every day | RECTAL | Status: DC

## 2012-10-07 MED ORDER — MORPHINE SULFATE 2 MG/ML IJ SOLN
2.0000 mg | INTRAMUSCULAR | Status: DC | PRN
  Administered 2012-10-08: 2 mg via INTRAVENOUS
  Filled 2012-10-07: qty 1

## 2012-10-07 MED ORDER — DOCUSATE SODIUM 100 MG PO CAPS
200.0000 mg | ORAL_CAPSULE | Freq: Every day | ORAL | Status: DC
  Administered 2012-10-08 – 2012-10-09 (×2): 200 mg via ORAL
  Administered 2012-10-10: 100 mg via ORAL
  Administered 2012-10-11: 200 mg via ORAL
  Filled 2012-10-07 (×4): qty 2

## 2012-10-07 MED ORDER — SODIUM CHLORIDE 0.9 % IV SOLN
INTRAVENOUS | Status: AC
  Filled 2012-10-07: qty 1

## 2012-10-07 MED ORDER — BISACODYL 5 MG PO TBEC
10.0000 mg | DELAYED_RELEASE_TABLET | Freq: Every day | ORAL | Status: DC
  Administered 2012-10-08 – 2012-10-11 (×4): 10 mg via ORAL
  Filled 2012-10-07: qty 1
  Filled 2012-10-07 (×2): qty 2
  Filled 2012-10-07 (×2): qty 1

## 2012-10-07 MED ORDER — FAMOTIDINE IN NACL 20-0.9 MG/50ML-% IV SOLN
20.0000 mg | Freq: Two times a day (BID) | INTRAVENOUS | Status: AC
  Administered 2012-10-07: 20 mg via INTRAVENOUS

## 2012-10-07 MED ORDER — HEPARIN SODIUM (PORCINE) 1000 UNIT/ML IJ SOLN
INTRAMUSCULAR | Status: DC | PRN
  Administered 2012-10-07: 22000 [IU] via INTRAVENOUS

## 2012-10-07 MED ORDER — OXYCODONE HCL 5 MG PO TABS
5.0000 mg | ORAL_TABLET | ORAL | Status: DC | PRN
  Administered 2012-10-08 – 2012-10-10 (×5): 10 mg via ORAL
  Filled 2012-10-07 (×7): qty 2

## 2012-10-07 MED ORDER — LACTATED RINGERS IV SOLN: 500.0000 mL | Freq: Once | INTRAVENOUS | Status: AC | PRN

## 2012-10-07 MED ORDER — PROTAMINE SULFATE 10 MG/ML IV SOLN
INTRAVENOUS | Status: DC | PRN
  Administered 2012-10-07: 190 mg via INTRAVENOUS

## 2012-10-07 MED ORDER — MORPHINE SULFATE 2 MG/ML IJ SOLN: 1.0000 mg | INTRAMUSCULAR | Status: AC | PRN

## 2012-10-07 MED ORDER — ACETAMINOPHEN 10 MG/ML IV SOLN: 1000.0000 mg | Freq: Once | INTRAVENOUS | Status: AC

## 2012-10-07 MED ORDER — ROCURONIUM BROMIDE 100 MG/10ML IV SOLN
INTRAVENOUS | Status: DC | PRN
  Administered 2012-10-07: 50 mg via INTRAVENOUS

## 2012-10-07 MED ORDER — LACTATED RINGERS IV SOLN
INTRAVENOUS | Status: DC | PRN
  Administered 2012-10-07 (×2): via INTRAVENOUS

## 2012-10-07 MED ORDER — CHLORHEXIDINE GLUCONATE CLOTH 2 % EX PADS: 6.0000 | MEDICATED_PAD | Freq: Every day | CUTANEOUS | Status: DC

## 2012-10-07 MED ORDER — MIDAZOLAM HCL 2 MG/2ML IJ SOLN: 2.0000 mg | INTRAMUSCULAR | Status: DC | PRN

## 2012-10-07 MED ORDER — CALCIUM CHLORIDE 10 % IV SOLN
1.0000 g | Freq: Once | INTRAVENOUS | Status: AC | PRN
  Filled 2012-10-07: qty 10

## 2012-10-07 MED ORDER — SODIUM CHLORIDE 0.9 % IV SOLN: INTRAVENOUS | Status: DC

## 2012-10-07 MED ORDER — DEXTROSE 5 % IV SOLN
1.5000 g | Freq: Two times a day (BID) | INTRAVENOUS | Status: DC
  Administered 2012-10-08 – 2012-10-09 (×3): 1.5 g via INTRAVENOUS
  Filled 2012-10-07 (×4): qty 1.5

## 2012-10-07 MED ORDER — VANCOMYCIN HCL IN DEXTROSE 1-5 GM/200ML-% IV SOLN
1000.0000 mg | Freq: Once | INTRAVENOUS | Status: AC
  Administered 2012-10-08: 1000 mg via INTRAVENOUS
  Filled 2012-10-07: qty 200

## 2012-10-07 MED ORDER — ONDANSETRON HCL 4 MG/2ML IJ SOLN
4.0000 mg | Freq: Four times a day (QID) | INTRAMUSCULAR | Status: DC | PRN
  Administered 2012-10-08 (×2): 4 mg via INTRAVENOUS
  Filled 2012-10-07: qty 2

## 2012-10-07 MED ORDER — SODIUM CHLORIDE 0.9 % IV SOLN: 250.0000 mL | INTRAVENOUS | Status: DC

## 2012-10-07 MED ORDER — MUPIROCIN 2 % EX OINT
1.0000 "application " | TOPICAL_OINTMENT | Freq: Two times a day (BID) | CUTANEOUS | Status: AC
  Administered 2012-10-07 – 2012-10-11 (×9): 1 via NASAL
  Filled 2012-10-07 (×2): qty 22

## 2012-10-07 MED ORDER — SODIUM CHLORIDE 0.45 % IV SOLN: INTRAVENOUS | Status: DC

## 2012-10-07 MED ORDER — ASPIRIN 81 MG PO CHEW: 324.0000 mg | CHEWABLE_TABLET | Freq: Every day | ORAL | Status: DC

## 2012-10-07 MED ORDER — ALBUMIN HUMAN 5 % IV SOLN
250.0000 mL | INTRAVENOUS | Status: AC | PRN
  Administered 2012-10-07: 250 mL via INTRAVENOUS

## 2012-10-07 MED ORDER — MAGNESIUM SULFATE 40 MG/ML IJ SOLN
4.0000 g | Freq: Once | INTRAMUSCULAR | Status: AC
  Administered 2012-10-07: 4 g via INTRAVENOUS

## 2012-10-07 MED ORDER — ACETAMINOPHEN 500 MG PO TABS
1000.0000 mg | ORAL_TABLET | Freq: Four times a day (QID) | ORAL | Status: DC
  Administered 2012-10-08 – 2012-10-11 (×14): 1000 mg via ORAL
  Filled 2012-10-07 (×18): qty 2

## 2012-10-07 MED ORDER — ASPIRIN EC 325 MG PO TBEC
325.0000 mg | DELAYED_RELEASE_TABLET | Freq: Every day | ORAL | Status: DC
  Administered 2012-10-08: 325 mg via ORAL
  Filled 2012-10-07 (×2): qty 1

## 2012-10-07 MED ORDER — FENTANYL CITRATE 0.05 MG/ML IJ SOLN
INTRAMUSCULAR | Status: DC | PRN
  Administered 2012-10-07: 200 ug via INTRAVENOUS
  Administered 2012-10-07: 250 ug via INTRAVENOUS
  Administered 2012-10-07: 150 ug via INTRAVENOUS
  Administered 2012-10-07 (×2): 250 ug via INTRAVENOUS
  Administered 2012-10-07: 100 ug via INTRAVENOUS
  Administered 2012-10-07 (×2): 250 ug via INTRAVENOUS
  Administered 2012-10-07: 100 ug via INTRAVENOUS
  Administered 2012-10-07: 200 ug via INTRAVENOUS
  Administered 2012-10-07: 100 ug via INTRAVENOUS
  Administered 2012-10-07: 150 ug via INTRAVENOUS
  Administered 2012-10-07: 50 ug via INTRAVENOUS
  Administered 2012-10-07: 100 ug via INTRAVENOUS
  Administered 2012-10-07: 250 ug via INTRAVENOUS
  Administered 2012-10-07: 100 ug via INTRAVENOUS

## 2012-10-07 SURGICAL SUPPLY — 122 items
ADAPTER CARDIO PERF ANTE/RETRO (ADAPTER) IMPLANT
ADH SKN CLS APL DERMABOND .7 (GAUZE/BANDAGES/DRESSINGS) ×2
ADPR PRFSN 84XANTGRD RTRGD (ADAPTER)
APL SKNCLS STERI-STRIP NONHPOA (GAUZE/BANDAGES/DRESSINGS) ×2
APPLIER CLIP 9.375 MED OPEN (MISCELLANEOUS)
APPLIER CLIP 9.375 SM OPEN (CLIP)
APR CLP MED 9.3 20 MLT OPN (MISCELLANEOUS)
APR CLP SM 9.3 20 MLT OPN (CLIP)
ATTRACTOMAT 16X20 MAGNETIC DRP (DRAPES) ×3 IMPLANT
BAG DECANTER FOR FLEXI CONT (MISCELLANEOUS) ×3 IMPLANT
BANDAGE ELASTIC 4 VELCRO ST LF (GAUZE/BANDAGES/DRESSINGS) ×3 IMPLANT
BANDAGE ELASTIC 6 VELCRO ST LF (GAUZE/BANDAGES/DRESSINGS) ×3 IMPLANT
BANDAGE GAUZE ELAST BULKY 4 IN (GAUZE/BANDAGES/DRESSINGS) ×3 IMPLANT
BASKET HEART (ORDER IN 25'S) (MISCELLANEOUS) ×1
BASKET HEART (ORDER IN 25S) (MISCELLANEOUS) ×2 IMPLANT
BENZOIN TINCTURE PRP APPL 2/3 (GAUZE/BANDAGES/DRESSINGS) ×3 IMPLANT
BLADE STERNUM SYSTEM 6 (BLADE) ×3 IMPLANT
BLADE SURG ROTATE 9660 (MISCELLANEOUS) IMPLANT
CANISTER SUCTION 2500CC (MISCELLANEOUS) ×3 IMPLANT
CANNULA GUNDRY RCSP 15FR (MISCELLANEOUS) IMPLANT
CATH CPB KIT OWEN (MISCELLANEOUS) ×3 IMPLANT
CATH THORACIC 28FR (CATHETERS) IMPLANT
CATH THORACIC 28FR RT ANG (CATHETERS) IMPLANT
CATH THORACIC 36FR (CATHETERS) ×3 IMPLANT
CATH THORACIC 36FR RT ANG (CATHETERS) ×3 IMPLANT
CLIP APPLIE 9.375 MED OPEN (MISCELLANEOUS) IMPLANT
CLIP APPLIE 9.375 SM OPEN (CLIP) IMPLANT
CLIP FOGARTY SPRING 6M (CLIP) ×1 IMPLANT
CLIP RETRACTION 3.0MM CORONARY (MISCELLANEOUS) ×1 IMPLANT
CLIP TI MEDIUM 24 (CLIP) IMPLANT
CLIP TI WIDE RED SMALL 24 (CLIP) IMPLANT
CLOTH BEACON ORANGE TIMEOUT ST (SAFETY) ×3 IMPLANT
CONN Y 3/8X3/8X3/8  BEN (MISCELLANEOUS)
CONN Y 3/8X3/8X3/8 BEN (MISCELLANEOUS) IMPLANT
COVER SURGICAL LIGHT HANDLE (MISCELLANEOUS) ×3 IMPLANT
CRADLE DONUT ADULT HEAD (MISCELLANEOUS) ×3 IMPLANT
DERMABOND ADVANCED (GAUZE/BANDAGES/DRESSINGS) ×1
DERMABOND ADVANCED .7 DNX12 (GAUZE/BANDAGES/DRESSINGS) IMPLANT
DRAIN CHANNEL 32F RND 10.7 FF (WOUND CARE) ×3 IMPLANT
DRAPE CARDIOVASCULAR INCISE (DRAPES) ×3
DRAPE INCISE IOBAN 66X45 STRL (DRAPES) ×1 IMPLANT
DRAPE SLUSH/WARMER DISC (DRAPES) ×3 IMPLANT
DRAPE SRG 135X102X78XABS (DRAPES) ×2 IMPLANT
DRSG COVADERM 4X14 (GAUZE/BANDAGES/DRESSINGS) ×3 IMPLANT
ELECT REM PT RETURN 9FT ADLT (ELECTROSURGICAL) ×6
ELECTRODE REM PT RTRN 9FT ADLT (ELECTROSURGICAL) ×4 IMPLANT
GLOVE BIO SURGEON STRL SZ 6 (GLOVE) ×4 IMPLANT
GLOVE BIO SURGEON STRL SZ 6.5 (GLOVE) ×6 IMPLANT
GLOVE BIO SURGEON STRL SZ7 (GLOVE) IMPLANT
GLOVE BIO SURGEON STRL SZ7.5 (GLOVE) IMPLANT
GLOVE BIOGEL PI IND STRL 6 (GLOVE) IMPLANT
GLOVE BIOGEL PI IND STRL 6.5 (GLOVE) IMPLANT
GLOVE BIOGEL PI IND STRL 7.0 (GLOVE) IMPLANT
GLOVE BIOGEL PI INDICATOR 6 (GLOVE)
GLOVE BIOGEL PI INDICATOR 6.5 (GLOVE) ×2
GLOVE BIOGEL PI INDICATOR 7.0 (GLOVE) ×4
GLOVE EUDERMIC 7 POWDERFREE (GLOVE) IMPLANT
GLOVE ORTHO TXT STRL SZ7.5 (GLOVE) ×6 IMPLANT
GOWN STRL NON-REIN LRG LVL3 (GOWN DISPOSABLE) ×16 IMPLANT
HEMOSTAT POWDER SURGIFOAM 1G (HEMOSTASIS) ×9 IMPLANT
INSERT FOGARTY 61MM (MISCELLANEOUS) IMPLANT
INSERT FOGARTY XLG (MISCELLANEOUS) ×3 IMPLANT
KIT BASIN OR (CUSTOM PROCEDURE TRAY) ×3 IMPLANT
KIT ROOM TURNOVER OR (KITS) ×3 IMPLANT
KIT SUCTION CATH 14FR (SUCTIONS) ×15 IMPLANT
KIT VASOVIEW W/TROCAR VH 2000 (KITS) ×3 IMPLANT
LEAD PACING MYOCARDI (MISCELLANEOUS) ×3 IMPLANT
MARKER GRAFT CORONARY BYPASS (MISCELLANEOUS) ×9 IMPLANT
NS IRRIG 1000ML POUR BTL (IV SOLUTION) ×15 IMPLANT
PACK OPEN HEART (CUSTOM PROCEDURE TRAY) ×3 IMPLANT
PAD ARMBOARD 7.5X6 YLW CONV (MISCELLANEOUS) ×3 IMPLANT
PENCIL BUTTON HOLSTER BLD 10FT (ELECTRODE) ×3 IMPLANT
PUNCH AORTIC ROTATE  4.5MM 8IN (MISCELLANEOUS) ×1 IMPLANT
PUNCH AORTIC ROTATE 4.0MM (MISCELLANEOUS) IMPLANT
PUNCH AORTIC ROTATE 4.5MM 8IN (MISCELLANEOUS) IMPLANT
PUNCH AORTIC ROTATE 5MM 8IN (MISCELLANEOUS) IMPLANT
SOLUTION ANTI FOG 6CC (MISCELLANEOUS) IMPLANT
SPONGE GAUZE 4X4 12PLY (GAUZE/BANDAGES/DRESSINGS) ×6 IMPLANT
SPONGE LAP 18X18 X RAY DECT (DISPOSABLE) IMPLANT
SPONGE LAP 4X18 X RAY DECT (DISPOSABLE) IMPLANT
SUT BONE WAX W31G (SUTURE) ×3 IMPLANT
SUT ETHIBOND X763 2 0 SH 1 (SUTURE) ×6 IMPLANT
SUT MNCRL AB 3-0 PS2 18 (SUTURE) ×6 IMPLANT
SUT MNCRL AB 4-0 PS2 18 (SUTURE) IMPLANT
SUT PDS AB 1 CTX 36 (SUTURE) ×6 IMPLANT
SUT PROLENE 2 0 SH DA (SUTURE) IMPLANT
SUT PROLENE 3 0 SH DA (SUTURE) ×3 IMPLANT
SUT PROLENE 3 0 SH1 36 (SUTURE) ×2 IMPLANT
SUT PROLENE 4 0 RB 1 (SUTURE) ×6
SUT PROLENE 4 0 SH DA (SUTURE) IMPLANT
SUT PROLENE 4-0 RB1 .5 CRCL 36 (SUTURE) IMPLANT
SUT PROLENE 5 0 C 1 36 (SUTURE) IMPLANT
SUT PROLENE 6 0 C 1 30 (SUTURE) ×3 IMPLANT
SUT PROLENE 7.0 RB 3 (SUTURE) ×9 IMPLANT
SUT PROLENE 8 0 BV175 6 (SUTURE) IMPLANT
SUT PROLENE BLUE 7 0 (SUTURE) ×3 IMPLANT
SUT PROLENE POLY MONO (SUTURE) IMPLANT
SUT SILK  1 MH (SUTURE) ×1
SUT SILK 1 MH (SUTURE) ×2 IMPLANT
SUT STEEL 6MS V (SUTURE) ×1 IMPLANT
SUT STEEL STERNAL CCS#1 18IN (SUTURE) IMPLANT
SUT STEEL SZ 6 DBL 3X14 BALL (SUTURE) ×2 IMPLANT
SUT VIC AB 1 CTX 36 (SUTURE)
SUT VIC AB 1 CTX36XBRD ANBCTR (SUTURE) IMPLANT
SUT VIC AB 2-0 CT1 27 (SUTURE)
SUT VIC AB 2-0 CT1 TAPERPNT 27 (SUTURE) IMPLANT
SUT VIC AB 2-0 CTX 27 (SUTURE) IMPLANT
SUT VIC AB 2-0 CTX 36 (SUTURE) ×1 IMPLANT
SUT VIC AB 3-0 SH 27 (SUTURE)
SUT VIC AB 3-0 SH 27X BRD (SUTURE) IMPLANT
SUT VIC AB 3-0 X1 27 (SUTURE) IMPLANT
SUT VICRYL 4-0 PS2 18IN ABS (SUTURE) IMPLANT
SUTURE E-PAK OPEN HEART (SUTURE) ×3 IMPLANT
SYSTEM SAHARA CHEST DRAIN ATS (WOUND CARE) ×3 IMPLANT
TAPE CLOTH SURG 4X10 WHT LF (GAUZE/BANDAGES/DRESSINGS) ×1 IMPLANT
TOWEL OR 17X24 6PK STRL BLUE (TOWEL DISPOSABLE) ×6 IMPLANT
TOWEL OR 17X26 10 PK STRL BLUE (TOWEL DISPOSABLE) ×6 IMPLANT
TRAY FOLEY IC TEMP SENS 14FR (CATHETERS) ×3 IMPLANT
TUBE SUCT INTRACARD DLP 20F (MISCELLANEOUS) ×3 IMPLANT
TUBING INSUFFLATION 10FT LAP (TUBING) ×3 IMPLANT
UNDERPAD 30X30 INCONTINENT (UNDERPADS AND DIAPERS) ×3 IMPLANT
WATER STERILE IRR 1000ML POUR (IV SOLUTION) ×6 IMPLANT

## 2012-10-07 NOTE — Progress Notes (Signed)
Patient ID: [name], male   DOB: [birth year], 62 y.o.   MRN: [id number]  Hemodynamically stable.  Chest tube output low.  Urine output good  CBC    Component Value Date/Time   WBC 9.0 10/07/2012 1835   RBC 3.98* 10/07/2012 1835   HGB 11.6* 10/07/2012 1845   HCT 34.0* 10/07/2012 1845   PLT 64* 10/07/2012 1835   MCV 83.4 10/07/2012 1835   MCH 29.1 10/07/2012 1835   MCHC 34.9 10/07/2012 1835   RDW 13.7 10/07/2012 1835   LYMPHSABS 1.4 03/25/2012 1308   MONOABS 0.6 03/25/2012 1308   EOSABS 0.3 03/25/2012 1308   BASOSABS 0.0 03/25/2012 1308    BMET    Component Value Date/Time   NA 143 10/07/2012 1845   K 4.1 10/07/2012 1845   CL 105 10/07/2012 0530   CO2 29 10/07/2012 0530   GLUCOSE 112* 10/07/2012 1845   BUN 16 10/07/2012 0530   CREATININE 1.22 10/07/2012 0530   CALCIUM 9.6 10/07/2012 0530   GFRNONAA 63* 10/07/2012 0530   GFRAA 73* 10/07/2012 0530    A/P: stable s/p CABG Wean vent as tolerated.

## 2012-10-07 NOTE — Care Management Note (Signed)
    Page 1 of 1   10/07/2012     11:27:52 AM   CARE MANAGEMENT NOTE 10/07/2012  Patient:  [name], [name]   Account Number:  [id number]  Date Initiated:  10/06/2012  Documentation initiated by:  Junius Creamer  Subjective/Objective Assessment:   adm  w ch pain     Action/Plan:   lives w wife, pcp dr Durel Salts   Anticipated DC Date:     Anticipated DC Plan:        DC Planning Services  CM consult      Choice offered to / List presented to:             Status of service:   Medicare Important Message given?   (If response is "NO", the following Medicare IM given date fields will be blank) Date Medicare IM given:   Date Additional Medicare IM given:    Discharge Disposition:    Per UR Regulation:  Reviewed for med. necessity/level of care/duration of stay  If discussed at Long Length of Stay Meetings, dates discussed:    Comments:  4/8 1126a debbie Guy Fiumara rn,bsn spoke w pt and wife. they have 2 adult sons that will assist wife w 24hrs assist after disch. hopes to be home in a few days. indep pta.

## 2012-10-07 NOTE — Op Note (Signed)
CARDIOTHORACIC SURGERY OPERATIVE NOTE  Date of Procedure: 10/07/2012  Preoperative Diagnosis: Severe 2-vessel Coronary Artery Disease  Postoperative Diagnosis: Same  Procedure:   Coronary Artery Bypass Grafting x 3  Left Internal Mammary Artery to Distal Left Anterior Descending Coronary Artery Saphenous Vein Graft to Ramus Intermediate Branch Coronary Artery with Sequential Saphenous Vein Graft to Obtuse Marginal Branch of Left Circumflex Coronary Artery Endoscopic Vein Harvest from Right Thigh  Surgeon: Salvatore Decent. Cornelius Moras, MD  Assistant: Doree Fudge, PA-C  Anesthesia: Kipp Brood, MD  Operative Findings:  Normal LV function  Small caliber but o/w good quality LIMA for grafting  Good quality SVG conduit for grafting  Intramyocardial left anterior descending coronary artery  Good quality target vessels for grafting    BRIEF CLINICAL NOTE AND INDICATIONS FOR SURGERY  Patient is a 62 year old male with known history of coronary artery disease, hyperlipidemia, type 2 diabetes mellitus, and ongoing tobacco abuse who originally presented in 2009 with acute coronary syndrome at which time he was treated with PCI and stenting of the left anterior descending coronary artery. He underwent PCI and stenting of the right coronary artery using a bare metal stent in 2009. Since then he has continued to do well until 3 days ago when he developed sudden onset of sharp substernal chest pain while he was playing golf. The severe sharp pain resolved quickly but the patient had persistent vague mild chest discomfort that ultimately prompted him to present to the emergency department later that afternoon. In the emergency department his chest pain was improved with nitroglycerin. Baseline EKG and cardiac enzymes were normal.  Patient subsequently underwent cardiac catheterization on April 4 demonstrating severe two-vessel coronary artery disease with essentially the equivalent of left main  disease including high-grade ostial stenosis of both the left anterior ascending coronary artery and the left circumflex coronary artery. Left ventricular function is preserved. Followup transthoracic echocardiogram also demonstrates normal left ventricular function. The patient has remained clinically stable on medical therapy, and cardiothoracic surgical consultation was requested.  The patient has been seen in consultation and counseled at length regarding the indications, risks and potential benefits of surgery.  All questions have been answered, and the patient provides full informed consent for the operation as described.      DETAILS OF THE OPERATIVE PROCEDURE  The patient is brought to the operating room on the above mentioned date and central monitoring was established by the anesthesia team including placement of Swan-Ganz catheter and radial arterial line. The patient is placed in the supine position on the operating table.  Intravenous antibiotics are administered. General endotracheal anesthesia is induced uneventfully. A Foley catheter is placed.  Baseline transesophageal echocardiogram was performed.  Findings were notable for normal LV size and systolic function.  The patient's chest, abdomen, both groins, and both lower extremities are prepared and draped in a sterile manner. A time out procedure is performed.  A median sternotomy incision was performed and the left internal mammary artery is dissected from the chest wall and prepared for bypass grafting. The left internal mammary artery is notably somewhat small caliber but otherwise good quality conduit. Simultaneously, saphenous vein is obtained from the patient's right thigh using endoscopic vein harvest technique. The saphenous vein is notably good quality conduit. After removal of the saphenous vein, the small surgical incisions in the lower extremity are closed with absorbable suture. Following systemic heparinization, the left  internal mammary artery was transected distally noted to have excellent flow.  The pericardium is opened. The  ascending aorta is in appearance. The ascending aorta and the right atrium are cannulated for cardioplegia bypass.  Adequate heparinization is verified.   The entire pre-bypass portion of the operation was notable for stable hemodynamics.  Cardiopulmonary bypass was begun and the surface of the heart is inspected. Distal target vessels are selected for coronary artery bypass grafting. A cardioplegia cannula is placed in the ascending aorta.  A temperature probe was placed in the interventricular septum.  The patient is allowed to cool passively to [facility] systemic temperature.  The aortic cross clamp is applied and cold blood cardioplegia is delivered initially in an antegrade fashion through the aortic root.  Iced saline slush is applied for topical hypothermia.  The initial cardioplegic arrest is rapid with early diastolic arrest.  Repeat doses of cardioplegia are administered intermittently throughout the entire cross clamp portion of the operation through the aortic root and through subsequently placed vein grafts in order to maintain completely flat electrocardiogram and septal myocardial temperature below 15C.  Myocardial protection was felt to be excellent.  The following distal coronary artery bypass grafts were performed:   The ramus intermediate branch coronary artery was grafted using a reversed saphenous vein graft in an side-to-side fashion.  At the site of distal anastomosis the target vessel was good quality and measured approximately 2.0 mm in diameter.  The obtuse marginal branch of the left circumflex coronary artery was grafted using a sequential saphenous vein graft in an end-to-side fashion off of the vein placed to the intermediate branch.  At the site of distal anastomosis the target vessel was good quality and measured approximately 2.0 mm in diameter.  The distal left  anterior coronary artery was grafted with the left internal mammary artery in an end-to-side fashion.  At the site of distal anastomosis the target vessel was intramyocardial but good quality and measured approximately 1.5 mm in diameter.   All proximal vein graft anastomoses were placed directly to the ascending aorta prior to removal of the aortic cross clamp.  The septal myocardial temperature rose rapidly after reperfusion of the left internal mammary artery graft.  The aortic cross clamp was removed after a total cross clamp time of 51 minutes.  All proximal and distal coronary anastomoses were inspected for hemostasis and appropriate graft orientation. Epicardial pacing wires are fixed to the right ventricular outflow tract and to the right atrial appendage. The patient is rewarmed to 37C temperature. The patient is weaned and disconnected from cardiopulmonary bypass.  The patient's rhythm at separation from bypass was sinus.  The patient was weaned from cardioplegic bypass without any inotropic support. Total cardiopulmonary bypass time for the operation was 63 minutes.  Followup transesophageal echocardiogram performed after separation from bypass revealed  no changes from the preoperative exam.  The aortic and venous cannula were removed uneventfully. Protamine was administered to reverse the anticoagulation. The mediastinum and pleural space were inspected for hemostasis and irrigated with saline solution. The mediastinum and the left pleural space were drained using 3 chest tubes placed through separate stab incisions inferiorly.  The soft tissues anterior to the aorta were reapproximated loosely. The sternum is closed with double strength sternal wire. The soft tissues anterior to the sternum were closed in multiple layers and the skin is closed with a running subcuticular skin closure.  The post-bypass portion of the operation was notable for stable rhythm and hemodynamics.  No blood  products were administered during the operation.  The patient tolerated the procedure well and is  transported to the surgical intensive care in stable condition. There are no intraoperative complications. All sponge instrument and needle counts are verified correct at completion of the operation.    Salvatore Decent. Cornelius Moras MD 10/07/2012 6:15 PM

## 2012-10-07 NOTE — Preoperative (Signed)
Beta Blockers   Reason not to administer Beta Blockers:Not Applicable  Pt took am dose Metoprolol 12.5 mg (0457)

## 2012-10-07 NOTE — Anesthesia Preprocedure Evaluation (Signed)
Anesthesia Evaluation  Patient identified by MRN, date of birth, ID band Patient awake    History of Anesthesia Complications Negative for: history of anesthetic complications  Airway Mallampati: II  Neck ROM: Full    Dental   Pulmonary neg pulmonary ROS,  breath sounds clear to auscultation        Cardiovascular hypertension, + CAD, + Past MI and + Peripheral Vascular Disease Rhythm:Regular Rate:Normal     Neuro/Psych Anxiety Depression    GI/Hepatic PUD,   Endo/Other  diabetes  Renal/GU      Musculoskeletal   Abdominal   Peds  Hematology   Anesthesia Other Findings   Reproductive/Obstetrics                           Anesthesia Physical Anesthesia Plan  ASA: III  Anesthesia Plan: General   Post-op Pain Management:    Induction: Intravenous  Airway Management Planned: Oral ETT  Additional Equipment: Arterial line, PA Cath and TEE  Intra-op Plan:   Post-operative Plan: Post-operative intubation/ventilation  Informed Consent:   Dental advisory given  Plan Discussed with: CRNA and Surgeon  Anesthesia Plan Comments:         Anesthesia Quick Evaluation

## 2012-10-07 NOTE — OR Nursing (Signed)
First call made to SICU charge RN.

## 2012-10-07 NOTE — Brief Op Note (Addendum)
10/02/2012 - 10/07/2012  4:56 PM  PATIENT:  [name]  62 y.o. male  PRE-OPERATIVE DIAGNOSIS:  History of CAD (s/p PCI with stents to LAD and RCA)  POST-OPERATIVE DIAGNOSIS:  History of CAD (s/p PCI with stents to LAD and RCA)   PROCEDURE:  INTRAOPERATIVE TRANSESOPHAGEAL ECHOCARDIOGRAM, MEDIAN STERNOTOMY for CORONARY ARTERY BYPASS GRAFTING (CABG) x 3 (LIMA to LAD, SVG SEQUENTIALLY to RAMUS INT AND OM) with EVH from right thigh  SURGEON:    Purcell Nails, MD  ASSISTANTS:  Doree Fudge, PA-C  ANESTHESIA:   Kipp Brood, MD  CROSSCLAMP TIME:   83'  CARDIOPULMONARY BYPASS TIME: 78'  FINDINGS:  Normal LV function  Small caliber but o/w good quality LIMA for grafting  Good quality SVG conduit for grafting  Intramyocardial left anterior descending coronary artery  Good quality target vessels for grafting  COMPLICATIONS: none  PRE OP WEIGHT: 75 kg  PATIENT DISPOSITION:   TO SICU IN STABLE CONDITION  OWEN,CLARENCE H 10/07/2012 6:08 PM

## 2012-10-07 NOTE — Progress Notes (Signed)
ANTICOAGULATION CONSULT NOTE - Follow Up Consult  Pharmacy Consult for Heparin Indication: CAD  Allergies  Allergen Reactions  . Meperidine Hcl Other (See Comments)    Was mixed with phenergan and turned blood vessels red.   . Promethazine Hcl Other (See Comments)    Was mixed with the Demerol and caused veins to turn red.     Patient Measurements: Height: 5\' 11"  (180.3 cm) Weight: 165 lb 9.1 oz (75.1 kg) IBW/kg (Calculated) : 75.3 Heparin Dosing Weight:   Vital Signs: Temp: 98.2 F (36.8 C) (04/08 0800) Temp src: Oral (04/08 0800) BP: 112/74 mmHg (04/08 0800) Pulse Rate: 75 (04/08 0800)  Labs:  Recent Labs  10/05/12 0446 10/06/12 0554 10/06/12 1144 10/07/12 0530  HGB 14.0 13.9  --  15.1  HCT 41.1 41.7  --  45.2  PLT 102* 107*  --  122*  APTT  --   --   --  94*  HEPARINUNFRC 0.64 0.80* 0.70 0.53  CREATININE  --  1.15  --  1.22    Estimated Creatinine Clearance: 69.3 ml/min (by C-G formula based on Cr of 1.22).  62 yo male on IV heparin for 2 vessel severe CAD, CABG 4/8. Heparin level back in goal range this morning, heparin d/c'd at 6 AM. Hgb stable. Platelets low but stable.  Goal of Therapy:  Heparin level 0.3-0.7 units/ml Monitor platelets by anticoagulation protocol: Yes   Plan:  D/C IV heparin in anticipation of CABG. Pharmacy will continue to follow peripherally.  Tad Pugmire, BCPS  Clinical Pharmacist Pager [phone]  10/07/2012 9:26 AM

## 2012-10-07 NOTE — CV Procedure (Signed)
Intraoperative Transesophageal Echocardiography Report:   [name] is a 62 year old male with a history of  coronary artery disease with previous stenting to the LAD in 1999 and RCA and LAD in July 2009. He developed recurrent chest discomfort and underwent cardiac catheterization on April 4. This revealed severe two-vessel coronary disease with high-grade ostial stenoses of the left anterior descending and left circumflex coronary coronary arteries. Left ventricular function was preserved. He is now scheduled to undergo coronary artery bypass grafting by Dr. Cornelius Moras. Intraoperative transesophageal echocardiography was requested to evaluate the left and right ventricular function, to assess for any valvular pathology, and to assist with deairing of the heart.  The patient was brought to the operating room and [facility] and general anesthesia was induced without difficulty. Following endotracheal intubation and orogastric suctioning, the transesophageal echocardiography probe was inserted into the esophagus without difficulty.  Impression: Pre-bypass findings:  1. Aortic valve: The aortic valve appeared normal. It was trileaflet, the leaflets opened normally and there was no aortic insufficiency.  2. Mitral valve: The mitral leaflets opened normally and coapted well. There was trace mitral insufficiency. There were no prolapse prolapsing or flail segments noted.  3. Left ventricle: Left ventricular function was well-preserved. There was good contractility in all segments interrogated. The ejection fraction was estimated at 60%. Left ventricular end-diastolic diameter measured 5.1 cm at end diastole at the mid-papillary level in the transgastric short axis view. Left ventricular wall thickness measured 0.95-1.0 cm of the posterior and anterior walls at end-diastole. Left ventricular end-systolic diameter measured 3.5 cm.  4. Right ventricle: The right ventricular size was normal. There was  normal contractility the right ventricular free wall.  5. Tricuspid valve: Tricuspid valve appeared structurally normal with trace tricuspid insufficiency.  6. Interatrial septum: Interatrial septum was intact without evidence of patent foramen ovale or atrial septal defect by color Doppler or bubble study.  8. Left atrium: The left there was no thrombus noted in the left atrium or left atrial appendage  9. Ascending aorta: The  ascending aorta showed a well-defined aortic root and sinotubular ridge. There was no significant atheromatous disease appreciated. There was no effacement of the sinuses of Valsalva.  10. Descending aorta: The descending aorta appeared free of atheromatous disease and measured 2.14 cm in diameter.   Post-bypass findings:   1. Aortic valve: The aortic valve was unchanged from the pre-bypass study. The valve opened normally and there was no aortic insufficiency.  2. Mitral valve: The mitral valve was unchanged from the pre-bypass study and appeared normal with trace mitral insufficiency.  3. Left ventricle: There was good contractility in all segments interrogated with with the ejection fraction again estimated 60-65%. There were no regional wall motion abnormalities.   4. Right ventricle: The right ventricular size was normal and there was normal contractility the right ventricular free wall.  Kipp Brood, M.D.

## 2012-10-07 NOTE — Progress Notes (Signed)
  Echocardiogram Echocardiogram Transesophageal has been performed.  Jorje Guild 10/07/2012, 2:55 PM

## 2012-10-07 NOTE — Transfer of Care (Signed)
Immediate Anesthesia Transfer of Care Note  Patient: [name]  Procedure(s) Performed: Procedure(s): CORONARY ARTERY BYPASS GRAFTING (CABG) (N/A) INTRAOPERATIVE TRANSESOPHAGEAL ECHOCARDIOGRAM (N/A)  Patient Location: SICU  Anesthesia Type:General  Level of Consciousness: sedated  Airway & Oxygen Therapy: Patient remains intubated per anesthesia plan and Patient placed on Ventilator (see vital sign flow sheet for setting)  Post-op Assessment: Report given to PACU RN and Post -op Vital signs reviewed and stable  Post vital signs: Reviewed and stable  Complications: No apparent anesthesia complications

## 2012-10-07 NOTE — Anesthesia Postprocedure Evaluation (Signed)
  Anesthesia Post-op Note  Patient: [name]  Procedure(s) Performed: Procedure(s): CORONARY ARTERY BYPASS GRAFTING (CABG) (N/A) INTRAOPERATIVE TRANSESOPHAGEAL ECHOCARDIOGRAM (N/A)  Patient Location: SICU  Anesthesia Type:General  Level of Consciousness: sedated and Patient remains intubated per anesthesia plan  Airway and Oxygen Therapy: Patient remains intubated per anesthesia plan and Patient placed on Ventilator (see vital sign flow sheet for setting)  Post-op Pain: none  Post-op Assessment: Post-op Vital signs reviewed and Patient's Cardiovascular Status Stable  Post-op Vital Signs: stable  Complications: No apparent anesthesia complications

## 2012-10-08 ENCOUNTER — Inpatient Hospital Stay (HOSPITAL_COMMUNITY): Payer: PRIVATE HEALTH INSURANCE

## 2012-10-08 ENCOUNTER — Encounter (HOSPITAL_COMMUNITY): Payer: Self-pay | Admitting: Thoracic Surgery (Cardiothoracic Vascular Surgery)

## 2012-10-08 LAB — BASIC METABOLIC PANEL
BUN: 13 mg/dL (ref 6–23)
GFR calc Af Amer: >90 mL/min (ref >90)
GFR calc non Af Amer: 79 mL/min — ABNORMAL LOW (ref >90)
Potassium: 4.3 mEq/L (ref 3.5–5.1)
Sodium: 144 mEq/L (ref 135–145)

## 2012-10-08 LAB — POCT I-STAT, CHEM 8
Chloride: 106 mEq/L (ref 96–112)
Glucose, Bld: 132 mg/dL — ABNORMAL HIGH (ref 70–99)
HCT: 33 % — ABNORMAL LOW (ref 39.0–52.0)
Potassium: 4 mEq/L (ref 3.5–5.1)
Sodium: 141 mEq/L (ref 135–145)

## 2012-10-08 LAB — POCT I-STAT 3, ART BLOOD GAS (G3+)
Acid-base deficit: 3 mmol/L — ABNORMAL HIGH (ref 0.0–2.0)
Acid-base deficit: 3 mmol/L — ABNORMAL HIGH (ref 0.0–2.0)
O2 Saturation: 98 %
Patient temperature: 37.2
Patient temperature: 37.4
TCO2: 24 mmol/L (ref 0–100)
pCO2 arterial: 45.2 mmHg — ABNORMAL HIGH (ref 35.0–45.0)
pH, Arterial: 7.278 — ABNORMAL LOW (ref 7.350–7.450)
pO2, Arterial: 145 mmHg — ABNORMAL HIGH (ref 80.0–100.0)

## 2012-10-08 LAB — GLUCOSE, CAPILLARY
Glucose-Capillary: 112 mg/dL — ABNORMAL HIGH (ref 70–99)
Glucose-Capillary: 122 mg/dL — ABNORMAL HIGH (ref 70–99)
Glucose-Capillary: 131 mg/dL — ABNORMAL HIGH (ref 70–99)
Glucose-Capillary: 133 mg/dL — ABNORMAL HIGH (ref 70–99)
Glucose-Capillary: 143 mg/dL — ABNORMAL HIGH (ref 70–99)
Glucose-Capillary: 161 mg/dL — ABNORMAL HIGH (ref 70–99)
Glucose-Capillary: 162 mg/dL — ABNORMAL HIGH (ref 70–99)
Glucose-Capillary: 163 mg/dL — ABNORMAL HIGH (ref 70–99)
Glucose-Capillary: 168 mg/dL — ABNORMAL HIGH (ref 70–99)
Glucose-Capillary: 171 mg/dL — ABNORMAL HIGH (ref 70–99)
Glucose-Capillary: 84 mg/dL (ref 70–99)

## 2012-10-08 LAB — CBC
Hemoglobin: 11.8 g/dL — ABNORMAL LOW (ref 13.0–17.0)
MCHC: 33.9 g/dL (ref 30.0–36.0)
MCHC: 33.7 g/dL (ref 30.0–36.0)
Platelets: 67 10*3/uL — ABNORMAL LOW (ref 150–400)
RDW: 13.9 % (ref 11.5–15.5)
RDW: 14 % (ref 11.5–15.5)

## 2012-10-08 LAB — CREATININE, SERUM
Creatinine, Ser: 0.96 mg/dL (ref 0.50–1.35)
GFR calc non Af Amer: 89 mL/min — ABNORMAL LOW (ref >90)

## 2012-10-08 LAB — MAGNESIUM: Magnesium: 2.3 mg/dL (ref 1.5–2.5)

## 2012-10-08 MED ORDER — INSULIN ASPART 100 UNIT/ML ~~LOC~~ SOLN
0.0000 [IU] | SUBCUTANEOUS | Status: DC
  Administered 2012-10-08 (×2): 2 [IU] via SUBCUTANEOUS
  Administered 2012-10-08: 4 [IU] via SUBCUTANEOUS

## 2012-10-08 MED ORDER — KETOROLAC TROMETHAMINE 15 MG/ML IJ SOLN
15.0000 mg | Freq: Four times a day (QID) | INTRAMUSCULAR | Status: AC
  Administered 2012-10-08 – 2012-10-09 (×5): 15 mg via INTRAVENOUS
  Filled 2012-10-08 (×5): qty 1

## 2012-10-08 MED ORDER — PANTOPRAZOLE SODIUM 40 MG PO TBEC: 40.0000 mg | DELAYED_RELEASE_TABLET | Freq: Every day | ORAL | Status: DC

## 2012-10-08 MED ORDER — MIDAZOLAM HCL 2 MG/2ML IJ SOLN
INTRAMUSCULAR | Status: AC
  Filled 2012-10-08: qty 2

## 2012-10-08 MED ORDER — MORPHINE SULFATE 2 MG/ML IJ SOLN: 2.0000 mg | INTRAMUSCULAR | Status: DC | PRN

## 2012-10-08 MED ORDER — PANTOPRAZOLE SODIUM 40 MG PO TBEC
40.0000 mg | DELAYED_RELEASE_TABLET | Freq: Every day | ORAL | Status: DC
  Administered 2012-10-08 – 2012-10-11 (×4): 40 mg via ORAL
  Filled 2012-10-08 (×4): qty 1

## 2012-10-08 MED ORDER — FUROSEMIDE 10 MG/ML IJ SOLN
20.0000 mg | Freq: Four times a day (QID) | INTRAMUSCULAR | Status: AC
  Administered 2012-10-08 – 2012-10-09 (×3): 20 mg via INTRAVENOUS
  Filled 2012-10-08 (×3): qty 2

## 2012-10-08 MED ORDER — INSULIN DETEMIR 100 UNIT/ML ~~LOC~~ SOLN
20.0000 [IU] | Freq: Two times a day (BID) | SUBCUTANEOUS | Status: DC
  Filled 2012-10-08 (×2): qty 0.2

## 2012-10-08 MED ORDER — MIDAZOLAM HCL 2 MG/2ML IJ SOLN: 2.0000 mg | Freq: Once | INTRAMUSCULAR | Status: DC

## 2012-10-08 MED ORDER — ATORVASTATIN CALCIUM 80 MG PO TABS: 80.0000 mg | ORAL_TABLET | Freq: Every day | ORAL | Status: DC

## 2012-10-08 MED ORDER — INSULIN DETEMIR 100 UNIT/ML ~~LOC~~ SOLN
20.0000 [IU] | Freq: Two times a day (BID) | SUBCUTANEOUS | Status: DC
  Administered 2012-10-08 (×2): 20 [IU] via SUBCUTANEOUS
  Filled 2012-10-08 (×4): qty 0.2

## 2012-10-08 MED ORDER — ATORVASTATIN CALCIUM 80 MG PO TABS
80.0000 mg | ORAL_TABLET | Freq: Every day | ORAL | Status: DC
  Administered 2012-10-08 – 2012-10-10 (×3): 80 mg via ORAL
  Filled 2012-10-08 (×4): qty 1

## 2012-10-08 MED FILL — Heparin Sodium (Porcine) Inj 1000 Unit/ML: INTRAMUSCULAR | Qty: 10 | Status: AC

## 2012-10-08 MED FILL — Sodium Chloride IV Soln 0.9%: INTRAVENOUS | Qty: 1000 | Status: AC

## 2012-10-08 MED FILL — Mannitol IV Soln 20%: INTRAVENOUS | Qty: 500 | Status: AC

## 2012-10-08 MED FILL — Electrolyte-R (PH 7.4) Solution: INTRAVENOUS | Qty: 3000 | Status: AC

## 2012-10-08 MED FILL — Potassium Chloride Inj 2 mEq/ML: INTRAVENOUS | Qty: 40 | Status: AC

## 2012-10-08 MED FILL — Heparin Sodium (Porcine) Inj 1000 Unit/ML: INTRAMUSCULAR | Qty: 30 | Status: AC

## 2012-10-08 MED FILL — Sodium Chloride Irrigation Soln 0.9%: Qty: 3000 | Status: AC

## 2012-10-08 MED FILL — Magnesium Sulfate Inj 50%: INTRAMUSCULAR | Qty: 10 | Status: AC

## 2012-10-08 MED FILL — Lidocaine HCl IV Inj 20 MG/ML: INTRAVENOUS | Qty: 5 | Status: AC

## 2012-10-08 MED FILL — Sodium Bicarbonate IV Soln 8.4%: INTRAVENOUS | Qty: 50 | Status: AC

## 2012-10-08 NOTE — Progress Notes (Signed)
[facility] BRIEF SICU PROGRESS NOTE  1 Day Post-Op  S/P Procedure(s) (LRB): CORONARY ARTERY BYPASS GRAFTING (CABG) (N/A) INTRAOPERATIVE TRANSESOPHAGEAL ECHOCARDIOGRAM (N/A)   Doing very well NSR w/ stable BP off Neo Ambulated around SICU on room air UOP adequate  Plan: Continue routine care  Caprice Mccaffrey H 10/08/2012 6:58 PM

## 2012-10-08 NOTE — Progress Notes (Signed)
Anesthesiology Follow-up:  Awake, alert, sitting in chair, reading the newspaper. Minimal pain   VS: T- 36.6 Bp 121/67 HR 88 (SR with PACs) RR 17 O2 Sat 97% on RA  H/H: 11.8/35 Plts 67,000 K- 4.0 BUN/Cr 15/0.90   Extubated 3 1/2 hours post-op. Stable post-op course, no apparent complications  Kipp Brood, MD

## 2012-10-08 NOTE — Progress Notes (Addendum)
                   [address]            [name] [address]          [phone]      1 Day Post-Op Procedure(s) (LRB): CORONARY ARTERY BYPASS GRAFTING (CABG) (N/A) INTRAOPERATIVE TRANSESOPHAGEAL ECHOCARDIOGRAM (N/A)  Subjective: Patient awake and alert.  Objective: Vital signs in last 24 hours: Temp:  [95.7 F (35.4 C)-99.7 F (37.6 C)] 98.4 F (36.9 C) (04/09 0700) Pulse Rate:  [59-92] 83 (04/09 0700) Cardiac Rhythm:  [-] Atrial paced (04/09 0700) Resp:  [9-19] 17 (04/09 0700) BP: (91-142)/(54-77) 91/54 mmHg (04/08 2023) SpO2:  [96 %-100 %] 100 % (04/09 0700) Arterial Line BP: (93-149)/(52-83) 149/69 mmHg (04/09 0700) FiO2 (%):  [40 %-50 %] 40 % (04/08 2335) Weight:  [77.5 kg (170 lb 13.7 oz)] 77.5 kg (170 lb 13.7 oz) (04/09 0700)  Pre op weight 75 kg Current Weight  10/08/12 77.5 kg (170 lb 13.7 oz)    Hemodynamic parameters for last 24 hours: PAP: (19-30)/(11-20) 25/15 mmHg CO:  [3.9 L/min-5.7 L/min] 5.7 L/min CI:  [2 L/min/m2-2.9 L/min/m2] 2.9 L/min/m2  Intake/Output from previous day: 04/08 0701 - 04/09 0700 In: 5203 [I.V.:4168; Blood:535; IV Piggyback:500] Out: 6560 [Urine:4625; Blood:1455; Chest Tube:480]   Physical Exam:  Cardiovascular: RRR, rub with chest tubes in place Pulmonary: Slightly diminished at bases; no rales, wheezes, or rhonchi. Abdomen: Soft, non tender, bowel sounds present. Extremities: Mild bilateral lower extremity edema. Wounds: Dressings are clean and dry.  Neurologic: Grossly intact without focal deficits  Lab Results: CBC: Recent Labs  10/07/12 1835 10/07/12 1845 10/08/12 0430  WBC 9.0  --  14.8*  HGB 11.6* 11.6* 12.7*  HCT 33.2* 34.0* 37.5*  PLT 64*  --  84*   BMET:  Recent Labs  10/07/12 0530  10/07/12 1845 10/08/12 0430  NA 141  < > 143 144  K 4.0  < > 4.1 4.3  CL 105  --   --  110  CO2 29  --   --  24  GLUCOSE 121*  < > 112* 157*  BUN 16  --   --  13  CREATININE 1.22  --   --  1.02    CALCIUM 9.6  --   --  8.5  < > = values in this interval not displayed.  PT/INR:  Lab Results  Component Value Date   INR 1.38 10/07/2012   INR 1.13 10/03/2012   INR 1.0 01/21/2008   ABG:  INR: Will add last result for INR, ABG once components are confirmed Will add last 4 CBG results once components are confirmed  Assessment/Plan:  1. CV - SR. On Neo synephrine drip. Wean as tolerates. Start Lopressor 2.  Pulmonary - Chest tubes with 480 cc of output since surgery.CXR this am shows no pneumothorax, bibasilar atelectasis. Encourage incentive spirometer 3. Volume Overload - Begin diuresis 4.  Acute blood loss anemia - H and H stable at 12.7 and 37.5 5.DM-CBGs 94/105/163.Pre op HGA1C 6. Wean off Insulin drip. Will start Levemir for now. Will resume Jentadueto when tolerating diet better 6.See progression orders 7.Likely transfer in am  ZIMMERMAN,[name] MPA-C 10/08/2012,8:32 AM  I have seen and examined the patient and agree with the assessment and plan as outlined.  Doing very well POD1  Ercia Crisafulli H 10/08/2012 8:48 AM

## 2012-10-08 NOTE — Procedures (Signed)
Extubation Procedure Note  Patient Details:   Name: AVYAN LIVESAY DOB: 1950-09-16 MRN: [id number]   Airway Documentation:  Airway 8 mm (Active)  Secured at (cm) 22 cm 10/07/2012 11:35 PM  Measured From Lips 10/07/2012 11:35 PM  Secured Location Right 10/07/2012 11:35 PM  Secured By Caron Presume Tape 10/07/2012 11:35 PM  Cuff Pressure (cm H2O) 28 cm H2O 10/07/2012  8:23 PM  Site Condition Dry 10/07/2012 11:35 PM    Evaluation  O2 sats: stable throughout and currently acceptable Complications: No apparent complications Patient did tolerate procedure well. Bilateral Breath Sounds: Clear   Yes  Pt tolerated extubation procedure well. Mechanics were as follows: NIF -20 and VC 1.35 liters. Acceptable ABG results. Positive cuff leak. Clear ability to speak with no stridor noted. No apparent complications at this time. Patient currently on 4 lpm nasal cannula with o2 sats 98%.    HUDSON, Eathen Budreau N 10/08/2012, 12:11 AM

## 2012-10-09 ENCOUNTER — Inpatient Hospital Stay (HOSPITAL_COMMUNITY): Payer: PRIVATE HEALTH INSURANCE

## 2012-10-09 LAB — GLUCOSE, CAPILLARY
Glucose-Capillary: 187 mg/dL — ABNORMAL HIGH (ref 70–99)
Glucose-Capillary: 154 mg/dL — ABNORMAL HIGH (ref 70–99)
Glucose-Capillary: 142 mg/dL — ABNORMAL HIGH (ref 70–99)
Glucose-Capillary: 151 mg/dL — ABNORMAL HIGH (ref 70–99)
Glucose-Capillary: 98 mg/dL (ref 70–99)

## 2012-10-09 LAB — BASIC METABOLIC PANEL
Chloride: 107 mEq/L (ref 96–112)
Creatinine, Ser: 1.14 mg/dL (ref 0.50–1.35)
GFR calc Af Amer: 80 mL/min — ABNORMAL LOW (ref >90)
Potassium: 3.6 mEq/L (ref 3.5–5.1)

## 2012-10-09 LAB — CBC
HCT: 33.4 % — ABNORMAL LOW (ref 39.0–52.0)
RDW: 14.1 % (ref 11.5–15.5)
WBC: 8.6 10*3/uL (ref 4.0–10.5)

## 2012-10-09 MED ORDER — METOPROLOL TARTRATE 25 MG PO TABS
25.0000 mg | ORAL_TABLET | Freq: Two times a day (BID) | ORAL | Status: DC
  Administered 2012-10-09 (×2): 25 mg via ORAL
  Filled 2012-10-09 (×4): qty 1

## 2012-10-09 MED ORDER — ALPRAZOLAM 0.5 MG PO TABS: 0.5000 mg | ORAL_TABLET | Freq: Every evening | ORAL | Status: DC | PRN

## 2012-10-09 MED ORDER — FUROSEMIDE 40 MG PO TABS
40.0000 mg | ORAL_TABLET | Freq: Every day | ORAL | Status: DC
  Administered 2012-10-10 – 2012-10-11 (×2): 40 mg via ORAL
  Filled 2012-10-09 (×2): qty 1

## 2012-10-09 MED ORDER — INSULIN DETEMIR 100 UNIT/ML ~~LOC~~ SOLN
20.0000 [IU] | Freq: Every day | SUBCUTANEOUS | Status: DC
  Administered 2012-10-09 – 2012-10-10 (×2): 20 [IU] via SUBCUTANEOUS
  Filled 2012-10-09 (×3): qty 0.2

## 2012-10-09 MED ORDER — MOVING RIGHT ALONG BOOK
Freq: Once | Status: AC
  Administered 2012-10-09: 08:00:00
  Filled 2012-10-09: qty 1

## 2012-10-09 MED ORDER — SODIUM CHLORIDE 0.9 % IJ SOLN
3.0000 mL | Freq: Two times a day (BID) | INTRAMUSCULAR | Status: DC
  Administered 2012-10-09 – 2012-10-11 (×5): 3 mL via INTRAVENOUS

## 2012-10-09 MED ORDER — CEFUROXIME SODIUM 1.5 G IJ SOLR
1.5000 g | Freq: Once | INTRAMUSCULAR | Status: AC
  Administered 2012-10-09: 1.5 g via INTRAVENOUS
  Filled 2012-10-09: qty 1.5

## 2012-10-09 MED ORDER — POTASSIUM CHLORIDE 10 MEQ/50ML IV SOLN
10.0000 meq | INTRAVENOUS | Status: AC | PRN
  Administered 2012-10-09 (×3): 10 meq via INTRAVENOUS

## 2012-10-09 MED ORDER — ASPIRIN EC 81 MG PO TBEC: 81.0000 mg | DELAYED_RELEASE_TABLET | Freq: Every day | ORAL | Status: DC

## 2012-10-09 MED ORDER — ZOLPIDEM TARTRATE 5 MG PO TABS
10.0000 mg | ORAL_TABLET | Freq: Every evening | ORAL | Status: DC | PRN
  Administered 2012-10-09: 10 mg via ORAL
  Filled 2012-10-09: qty 2

## 2012-10-09 MED ORDER — POTASSIUM CHLORIDE CRYS ER 20 MEQ PO TBCR
20.0000 meq | EXTENDED_RELEASE_TABLET | Freq: Every day | ORAL | Status: DC
  Administered 2012-10-10 – 2012-10-11 (×2): 20 meq via ORAL
  Filled 2012-10-09 (×2): qty 1

## 2012-10-09 MED ORDER — TRAMADOL HCL 50 MG PO TABS: 50.0000 mg | ORAL_TABLET | ORAL | Status: DC | PRN

## 2012-10-09 MED ORDER — SODIUM CHLORIDE 0.9 % IV SOLN: 250.0000 mL | INTRAVENOUS | Status: DC | PRN

## 2012-10-09 MED ORDER — POTASSIUM CHLORIDE 10 MEQ/50ML IV SOLN
10.0000 meq | INTRAVENOUS | Status: AC
  Administered 2012-10-09 (×2): 10 meq via INTRAVENOUS
  Filled 2012-10-09: qty 100

## 2012-10-09 MED ORDER — ASPIRIN EC 325 MG PO TBEC
325.0000 mg | DELAYED_RELEASE_TABLET | Freq: Every day | ORAL | Status: DC
  Administered 2012-10-09 – 2012-10-11 (×3): 325 mg via ORAL
  Filled 2012-10-09 (×3): qty 1

## 2012-10-09 MED ORDER — SODIUM CHLORIDE 0.9 % IJ SOLN: 3.0000 mL | INTRAMUSCULAR | Status: DC | PRN

## 2012-10-09 MED ORDER — POTASSIUM CHLORIDE 10 MEQ/50ML IV SOLN
INTRAVENOUS | Status: AC
  Filled 2012-10-09: qty 150

## 2012-10-09 MED ORDER — INSULIN ASPART 100 UNIT/ML ~~LOC~~ SOLN
0.0000 [IU] | Freq: Three times a day (TID) | SUBCUTANEOUS | Status: DC
  Administered 2012-10-09 – 2012-10-11 (×5): 2 [IU] via SUBCUTANEOUS

## 2012-10-09 NOTE — Progress Notes (Addendum)
                   [address]            [name] [address]          [phone]      2 Days Post-Op Procedure(s) (LRB): CORONARY ARTERY BYPASS GRAFTING (CABG) (N/A) INTRAOPERATIVE TRANSESOPHAGEAL ECHOCARDIOGRAM (N/A)  Subjective: Patient sitting in chair. No real complaints.  Objective: Vital signs in last 24 hours: Temp:  [97.3 F (36.3 C)-98.6 F (37 C)] 98.2 F (36.8 C) (04/10 0740) Pulse Rate:  [30-90] 88 (04/10 0700) Cardiac Rhythm:  [-] Normal sinus rhythm (04/10 0000) Resp:  [8-24] 17 (04/10 0700) BP: (102-126)/(52-73) 122/67 mmHg (04/10 0700) SpO2:  [95 %-100 %] 97 % (04/10 0700) Arterial Line BP: (116-139)/(51-60) 128/56 mmHg (04/09 1000) Weight:  [77.3 kg (170 lb 6.7 oz)] 77.3 kg (170 lb 6.7 oz) (04/10 0700)  Pre op weight 75 kg Current Weight  10/09/12 77.3 kg (170 lb 6.7 oz)    Hemodynamic parameters for last 24 hours: PAP: (21-25)/(11-14) 21/11 mmHg CO:  [4.2 L/min-5.7 L/min] 4.2 L/min CI:  [2.1 L/min/m2-2.2 L/min/m2] 2.1 L/min/m2  Intake/Output from previous day: 04/09 0701 - 04/10 0700 In: 1325.1 [P.O.:600; I.V.:525.1; IV Piggyback:200] Out: 1795 [Urine:1625; Chest Tube:170]   Physical Exam:  Cardiovascular: RRR Pulmonary: Slightly diminished at bases; no rales, wheezes, or rhonchi. Abdomen: Soft, non tender, bowel sounds present. Extremities: Mild bilateral lower extremity edema. Wounds: Clean and dry.  Lab Results: CBC:  Recent Labs  10/08/12 1700 10/08/12 1718 10/09/12 0430  WBC 10.8*  --  8.6  HGB 11.8* 11.2* 11.3*  HCT 35.0* 33.0* 33.4*  PLT 67*  --  60*   BMET:  Recent Labs  10/08/12 0430  10/08/12 1718 10/09/12 0430  NA 144  --  141 141  K 4.3  --  4.0 3.6  CL 110  --  106 107  CO2 24  --   --  30  GLUCOSE 157*  --  132* 83  BUN 13  --  15 17  CREATININE 1.02  < > 0.90 1.14  CALCIUM 8.5  --   --  8.5  < > = values in this interval not displayed.  PT/INR:  Lab Results  Component Value Date   INR 1.38 10/07/2012   INR 1.13 10/03/2012   INR 1.0 01/21/2008   ABG:  INR: Will add last result for INR, ABG once components are confirmed Will add last 4 CBG results once components are confirmed  Assessment/Plan:  1. CV - SR. Weaned off of Neo synephrine drip yesterday. Brief run of NSVT earlier this am. Also having PVCs. Will increase Lopressor to 25 bid. 2.  Pulmonary - CXR this am is stable. Encourage incentive spirometer 3. Volume Overload - Continue Lasix 4.  Acute blood loss anemia - Thompson and Thompson stable at 11.3 and 33.4 5.DM-CBGs 161/84/77.Pre op HGA1C 6. Wean off Insulin drip. On Levemir for now. Will resume Jentadueto in am when tolerating diet better 6.Thrombocytopenia-platelets slightly decreased to 60,000. He has a history of chronic thrombocytopenia. On Ecasa 325 daily. 7.Supplement potassium 8.Transfer to 2000  ZIMMERMAN,[name] 10/09/2012,7:53 AM    I have seen and examined the patient and agree with the assessment and plan as outlined.  OWEN,[name] 10/09/2012 8:15 AM

## 2012-10-10 ENCOUNTER — Inpatient Hospital Stay (HOSPITAL_COMMUNITY): Payer: PRIVATE HEALTH INSURANCE

## 2012-10-10 LAB — BASIC METABOLIC PANEL
Calcium: 8.9 mg/dL (ref 8.4–10.5)
GFR calc Af Amer: 82 mL/min — ABNORMAL LOW (ref >90)
GFR calc non Af Amer: 71 mL/min — ABNORMAL LOW (ref >90)
Glucose, Bld: 117 mg/dL — ABNORMAL HIGH (ref 70–99)
Potassium: 3.9 mEq/L (ref 3.5–5.1)
Sodium: 140 mEq/L (ref 135–145)

## 2012-10-10 LAB — CBC
MCH: 28.6 pg (ref 26.0–34.0)
MCHC: 33.5 g/dL (ref 30.0–36.0)
Platelets: 81 10*3/uL — ABNORMAL LOW (ref 150–400)
RDW: 13.9 % (ref 11.5–15.5)

## 2012-10-10 LAB — GLUCOSE, CAPILLARY
Glucose-Capillary: 121 mg/dL — ABNORMAL HIGH (ref 70–99)
Glucose-Capillary: 109 mg/dL — ABNORMAL HIGH (ref 70–99)
Glucose-Capillary: 150 mg/dL — ABNORMAL HIGH (ref 70–99)

## 2012-10-10 MED ORDER — METOPROLOL TARTRATE 25 MG PO TABS
37.5000 mg | ORAL_TABLET | Freq: Two times a day (BID) | ORAL | Status: DC
  Administered 2012-10-10 – 2012-10-11 (×3): 37.5 mg via ORAL
  Filled 2012-10-10 (×4): qty 1

## 2012-10-10 NOTE — Discharge Summary (Signed)
Physician Discharge Summary  Patient ID: [name] MRN: [id number] DOB/AGE: [birth year] 62 y.o.  Admit date: 10/02/2012 Discharge date: 10/10/2012  Admission Diagnoses:  Patient Active Problem List  Diagnosis  . ABDOMINAL AORTIC ANEURYSM  . DIABETES MELLITUS, TYPE II  . HYPERLIPIDEMIA  . THROMBOCYTOPENIA  . ATHEROSCLEROTIC CARDIOVASCULAR DISEASE  . NEPHROLITHIASIS, RECURRENT  . ERECTILE DYSFUNCTION, ORGANIC  . INSOMNIA  . Abnormal CT scan, stomach  . Chest pain  . Hypertension   Discharge Diagnoses:   Patient Active Problem List  Diagnosis  . ABDOMINAL AORTIC ANEURYSM  . DIABETES MELLITUS, TYPE II  . HYPERLIPIDEMIA  . THROMBOCYTOPENIA  . ATHEROSCLEROTIC CARDIOVASCULAR DISEASE  . NEPHROLITHIASIS, RECURRENT  . ERECTILE DYSFUNCTION, ORGANIC  . INSOMNIA  . Abnormal CT scan, stomach  . Chest pain  . Hypertension  . S/P CABG x 3   Discharged Condition: good  History of Present Illness:   [name] is a 62 yo white male with known history of Hyperlipidemia, Type 2 Diabetes Mellitus, Tobacco abuse, and known history of CAD.  His coronary history originated in 2009 at which time the patient presented with Acute Coronary Syndrome and was treated with PCI with stent placement to the LAD and RCA.  The patient has done well since then.  However on 10/03/12 the patient developed sudden onset of sharp substernal chest pain while he was playing golf.  The pain resolved quickly, but that redeveloped and was mild but persistent chest discomfort.  The patient presented to the Emergency Department at which time he was given Nitroglycerin which alleviated the chest pain.  Initial EKG and Cardiac enzymes were normal.  The patient was admitted for further workup.   Hospital Course:   The patient subsequently underwent Cardiac Catheterization which showed severe 2 vessel CAD which was felt the be the equivalent of Left Main disease.  His ejection fraction was preserved.  It was felt the  patient would benefit from Coronary Bypass and TCTS was consulted.  The patient was evaluated by Dr. Cornelius Moras on 10/06/2012 at which time it was felt the patient would benefit from Coronary Bypass procedure.  The risks and benefits of the procedure were explained to the patient and he was agreeable to proceed.  The next morning the patient was taken to the operating room and underwent CABG x 3 utilizing LIMA to LAD, SVG to Ramus Intermediate, and SVG to OM1.  He also underwent Endoscopic Saphenous Vein Harvest from the right thigh.  He tolerated the procedure well and was taken to the SICU in stable condition.  The patient was extubated very early in the morning after surgery.  During his stay in the ICU the patient was weaned off his Neo synephrine drip as tolerated.  His chest tubes and pacing wires were removed without difficulty.  The patient was maintaining NSR and was transferred to the step down unit in stable condition.  The patient continues to do well.  His pacing wires were removed without difficulty.  He is ambulating with minimal assist and is tolerating his diet.  Should he continue to progress and no further issues arise we will plan to discharge the patient home in the next 24-48 hours.  The patient will follow up with Dr. Cornelius Moras on Nov 10, 2012 with a CXR prior to his appointment.  He will also need to follow up with Dr. Antoine Poche in 2-4 weeks time.   Consults: cardiology  Significant Diagnostic Studies: angiography:   Hemodynamics:  AO 99/60  LV 92/5  Coronary angiography:  Coronary dominance: Right  Left mainstem: LM distal 60% with plaque extending into the LAD/Circ  Left anterior descending (LAD): Ostial calcification with 80% stenosis. Mid diagonal large and normal.  Left circumflex (LCx): AV groove ostial 95% stenosis. Moderately severe calcification. RI large with proximal calcification. Long proximal 25%. MOM large with long proximal 25%.  Right coronary artery (RCA): Proximal stent  widely patent. Diffuse 25% lesions with moderate calcification.  Left ventriculography: LVEF is estimated at 55%, there is no significant mitral regurgitation . There is mild inferior hypokinesis.   Treatments: surgery:   Coronary Artery Bypass Grafting x 3  Left Internal Mammary Artery to Distal Left Anterior Descending Coronary Artery  Saphenous Vein Graft to Ramus Intermediate Branch Coronary Artery with  Sequential Saphenous Vein Graft to Obtuse Marginal Branch of Left Circumflex Coronary Artery  Endoscopic Vein Harvest from Right Thigh  Disposition: 01-Home or Self Care   Future Appointments Provider Department Dept Phone   11/10/2012 2:00 PM Purcell Nails, MD [facility] [phone]     Discharge Medications:    Medication List    STOP taking these medications       amLODipine 2.5 MG tablet  Commonly known as:  NORVASC     metoprolol succinate 50 MG 24 hr tablet  Commonly known as:  TOPROL-XL     UROCIT-K 15 15 MEQ (1620 MG) Tbcr  Generic drug:  Potassium Citrate      TAKE these medications       ALPRAZolam 0.5 MG tablet  Commonly known as:  XANAX  Take 0.5 mg by mouth at bedtime as needed. Sleep     aspirin 325 MG EC tablet  Take 1 tablet (325 mg total) by mouth daily.     atorvastatin 20 MG tablet  Commonly known as:  LIPITOR  Take 20 mg by mouth daily.     DSS 100 MG Caps  Take 200 mg by mouth 2 (two) times daily as needed for constipation.     fish oil-omega-3 fatty acids 1000 MG capsule  Take 1 capsule by mouth 2 (two) times daily.     furosemide 20 MG tablet  Commonly known as:  LASIX  Take 1 tablet (20 mg total) by mouth daily. For 5 Days     JENTADUETO 2.5-500 MG Tabs  Generic drug:  Linagliptin-Metformin HCl  Take 2 tablets by mouth 2 (two) times daily.     metoprolol tartrate 25 MG tablet  Commonly known as:  LOPRESSOR  Take 1.5 tablets (37.5 mg total) by mouth 2 (two) times daily.      oxyCODONE 5 MG immediate release tablet  Commonly known as:  Oxy IR/ROXICODONE  Take 1-2 tablets (5-10 mg total) by mouth every 4 (four) hours as needed.     pantoprazole 20 MG tablet  Commonly known as:  PROTONIX  Take 20 mg by mouth daily.     potassium chloride 10 MEQ tablet  Commonly known as:  K-DUR,KLOR-CON  Take 1 tablet (10 mEq total) by mouth daily. For 5 Days     ramipril 10 MG tablet  Commonly known as:  ALTACE  Take 10 mg by mouth daily.     tadalafil 10 MG tablet  Commonly known as:  CIALIS  Take 10 mg by mouth daily as needed.     zolpidem 10 MG tablet  Commonly known as:  AMBIEN  Take 10 mg by mouth at bedtime as needed. Sleep  The patient has been discharged on:   1.Beta Blocker:  Yes [ x  ]                              No   [   ]                              If No, reason:  2.Ace Inhibitor/ARB: Yes [ x  ]                                     No  [    ]                                     If No, reason:  3.Statin:   Yes [x   ]                  No  [   ]                  If No, reason:  4.Ecasa:  Yes  [ x  ]                  No   [   ]                  If No, reason:      Signed: Trenita Hulme 10/10/2012, 11:26 AM

## 2012-10-10 NOTE — Progress Notes (Addendum)
[address]       [place] [address]             [phone]    3 Days Post-Op  Procedure(s) (LRB): CORONARY ARTERY BYPASS GRAFTING (CABG) (N/A) INTRAOPERATIVE TRANSESOPHAGEAL ECHOCARDIOGRAM (N/A) Subjective: Feels good   Objective  Telemetry SR, ST, pvc's   Temp:  [98.3 F (36.8 C)-98.4 F (36.9 C)] 98.4 F (36.9 C) (04/11 0456) Pulse Rate:  [70-98] 89 (04/11 0456) Resp:  [14-20] 18 (04/11 0456) BP: (113-132)/(57-82) 122/74 mmHg (04/11 0456) SpO2:  [97 %-100 %] 99 % (04/11 0456) Weight:  [166 lb 7.2 oz (75.5 kg)] 166 lb 7.2 oz (75.5 kg) (04/11 0456)   Intake/Output Summary (Last 24 hours) at 10/10/12 0834 Last data filed at 10/10/12 0502  Gross per 24 hour  Intake    290 ml  Output   1220 ml  Net   -930 ml       General appearance: alert, cooperative and no distress Heart: regular rate and rhythm Lungs: mildly dim in bases Abdomen: benign Extremities: no edema Wound: incisions healing well  Lab Results:  Recent Labs  10/08/12 0430 10/08/12 1700  10/09/12 0430 10/10/12 0600  NA 144  --   < > 141 140  K 4.3  --   < > 3.6 3.9  CL 110  --   < > 107 106  CO2 24  --   --  30 26  GLUCOSE 157*  --   < > 83 117*  BUN 13  --   < > 17 16  CREATININE 1.02 0.96  < > 1.14 1.11  CALCIUM 8.5  --   --  8.5 8.9  MG 2.6* 2.3  --   --   --   < > = values in this interval not displayed. No results found for this basename: AST, ALT, ALKPHOS, BILITOT, PROT, ALBUMIN,  in the last 72 hours No results found for this basename: LIPASE, AMYLASE,  in the last 72 hours  Recent Labs  10/09/12 0430 10/10/12 0600  WBC 8.6 8.8  HGB 11.3* 11.7*  HCT 33.4* 34.9*  MCV 85.6 85.3  PLT 60* 81*   No results found for this basename: CKTOTAL, CKMB, TROPONINI,  in the last 72 hours No components found with this basename: POCBNP,  No results found for this basename: DDIMER,  in the last 72 hours No results found for this basename: HGBA1C,  in the last 72  hours No results found for this basename: CHOL, HDL, LDLCALC, TRIG, CHOLHDL,  in the last 72 hours No results found for this basename: TSH, T4TOTAL, FREET3, T3FREE, THYROIDAB,  in the last 72 hours No results found for this basename: VITAMINB12, FOLATE, FERRITIN, TIBC, IRON, RETICCTPCT,  in the last 72 hours  Medications: Scheduled . acetaminophen  1,000 mg Oral Q6H  . aspirin EC  325 mg Oral Daily  . atorvastatin  80 mg Oral q1800  . bisacodyl  10 mg Oral Daily   Or  . bisacodyl  10 mg Rectal Daily  . docusate sodium  200 mg Oral Daily  . furosemide  40 mg Oral Daily  . insulin aspart  0-24 Units Subcutaneous TID AC & HS  . insulin detemir  20 Units Subcutaneous QHS  . metoprolol tartrate  25 mg Oral BID  . mupirocin ointment  1 application Nasal BID  . pantoprazole  40 mg Oral Daily  . potassium chloride  20 mEq Oral Daily  .  sodium chloride  3 mL Intravenous Q12H     Radiology/Studies:  Dg Chest 2 View  10/10/2012  *RADIOLOGY REPORT*  Clinical Data: Postop CABG, shortness of breath  CHEST - 2 VIEW  Comparison: 10/09/2012; 10/08/2012; 10/07/2012  Findings: Grossly unchanged cardiac silhouette and mediastinal contours post median sternotomy and CABG.  Interval removal of right jugular approach vascular sheath.  No pneumothorax. Grossly unchanged small bilateral pleural effusions and bibasilar opacities, right greater than left.  Minimal linear heterogeneous opacities over the left mid lung at site of prior chest tube placement favored to represent atelectasis and/or contusion.  No new focal airspace opacities.  No definite evidence of edema. Unchanged bones.  IMPRESSION: 1.  Interval removal of support apparatus.  No pneumothorax. 2.  Grossly unchanged small bilateral effusions and bibasilar opacities, right greater than left, likely atelectasis.   Original Report Authenticated By: Tacey Ruiz, MD    Dg Chest Port 1 View  10/09/2012  *RADIOLOGY REPORT*  Clinical Data: Cardiac surgery   PORTABLE CHEST - 1 VIEW  Comparison: Yesterday  Findings: Removed with the right internal jugular into removed without pneumothorax.  No sign of edema.  Mild residual basilar atelectasis on the left.  IMPRESSION: Chest tubes removed without pneumothorax.  No edema.  Mild left base atelectasis.   Original Report Authenticated By: Jolaine Click, M.D.     INR: Will add last result for INR, ABG once components are confirmed Will add last 4 CBG results once components are confirmed  Assessment/Plan: S/P Procedure(s) (LRB): CORONARY ARTERY BYPASS GRAFTING (CABG) (N/A) INTRAOPERATIVE TRANSESOPHAGEAL ECHOCARDIOGRAM (N/A)  1 doing well 2 Will increase beta blocker dose 3 Results for SHINE, MIKES (MRN [id number]) as of 10/10/2012 08:35  Ref. Range 10/09/2012 16:09 10/09/2012 21:39 10/10/2012 05:13 10/10/2012 06:00 10/10/2012 06:49  Glucose-Capillary Latest Range: 70-99 mg/dL 409 (H) 98   811 (H)  Sugars adeq controlled 4 small effusions- cont gentle diuresis 5 routine pulm toilet/rehab 6 labs stable  LOS: 8 days    GOLD,WAYNE E 4/11/20148:34 AM  I have seen and examined the patient and agree with the assessment and plan as outlined.  Possible d/c home in am tomorrow  Purcell Nails 10/10/2012 9:26 AM

## 2012-10-10 NOTE — Progress Notes (Signed)
Pt up walking hallway tolerating well

## 2012-10-10 NOTE — Progress Notes (Signed)
CARDIAC REHAB PHASE I   PRE:  Rate/Rhythm: 70 SR  BP:  Supine:   Sitting: 130/72  Standing:    SaO2: 99 RA  MODE:  Ambulation: 450 ft   POST:  Rate/Rhythm: 77  BP:  Supine:   Sitting: 140/78  Standing:    SaO2: 100 RA 1055-1155 Pt tolerated ambulation well without c/o. Gait steady with hand held assist. VS stable. Pt back to bed after  walk for pacing wires to be discontinued. Completed discharge education with pt. He voices understanding. He agrees to [employer]. CRP in [place], will send referral. Discussed smoking cessation and gave pt tips for quitting and coaching contact number. Pt seems motivated to quit.  Melina Copa RN 10/10/2012 11:49 AM

## 2012-10-10 NOTE — Progress Notes (Signed)
EPW removed per protocol and intact, pt tolerated well, instructed bedrest for 1 hour. 130/80 HR 82. Will monitor closely [name] A

## 2012-10-11 LAB — GLUCOSE, CAPILLARY
Glucose-Capillary: 134 mg/dL — ABNORMAL HIGH (ref 70–99)
Glucose-Capillary: 136 mg/dL — ABNORMAL HIGH (ref 70–99)

## 2012-10-11 MED ORDER — DSS 100 MG PO CAPS: 200.0000 mg | ORAL_CAPSULE | Freq: Two times a day (BID) | ORAL | Status: DC | PRN

## 2012-10-11 MED ORDER — METOPROLOL TARTRATE 25 MG PO TABS: 37.5000 mg | ORAL_TABLET | Freq: Two times a day (BID) | ORAL | Status: DC

## 2012-10-11 MED ORDER — FUROSEMIDE 20 MG PO TABS: 20.0000 mg | ORAL_TABLET | Freq: Every day | ORAL | Status: DC

## 2012-10-11 MED ORDER — ASPIRIN 325 MG PO TBEC: 325.0000 mg | DELAYED_RELEASE_TABLET | Freq: Every day | ORAL | Status: DC

## 2012-10-11 MED ORDER — OXYCODONE HCL 5 MG PO TABS: 5.0000 mg | ORAL_TABLET | ORAL | Status: DC | PRN

## 2012-10-11 MED ORDER — POTASSIUM CHLORIDE CRYS ER 10 MEQ PO TBCR: 10.0000 meq | EXTENDED_RELEASE_TABLET | Freq: Every day | ORAL | Status: DC

## 2012-10-11 NOTE — Progress Notes (Signed)
Cardiology Attending - Courtesy Note  Long-standing patient of mine with progressive coronary disease requiring CABG this admission. He has done well postoperatively with plans for discharge today. We will see him back in [place] office and arrange for cardiac rehabilitation at [facility].   Bing, MD 10/11/2012, 8:29 AM

## 2012-10-11 NOTE — Progress Notes (Addendum)
4 Days Post-Op Procedure(s) (LRB): CORONARY ARTERY BYPASS GRAFTING (CABG) (N/A) INTRAOPERATIVE TRANSESOPHAGEAL ECHOCARDIOGRAM (N/A) Subjective:  [name] is without complaints this morning.  States he is ready for discharge  Objective: Vital signs in last 24 hours: Temp:  [98.3 F (36.8 C)-99 F (37.2 C)] 98.5 F (36.9 C) (04/12 0512) Pulse Rate:  [76-89] 76 (04/12 0512) Cardiac Rhythm:  [-] Normal sinus rhythm (04/11 1930) Resp:  [16-18] 16 (04/12 0512) BP: (115-133)/(71-87) 131/87 mmHg (04/12 0512) SpO2:  [97 %-100 %] 98 % (04/12 0512) Weight:  [162 lb 4.1 oz (73.6 kg)] 162 lb 4.1 oz (73.6 kg) (04/12 0512)  Intake/Output from previous day: 04/11 0701 - 04/12 0700 In: 480 [P.O.:480] Out: 2100 [Urine:2100]  General appearance: alert, cooperative and no distress Neurologic: intact Heart: regular rate and rhythm Lungs: clear to auscultation bilaterally Abdomen: soft, non-tender; bowel sounds normal; no masses,  no organomegaly Extremities: edema trace Wound: clean and dry  Lab Results:  Recent Labs  10/09/12 0430 10/10/12 0600  WBC 8.6 8.8  HGB 11.3* 11.7*  HCT 33.4* 34.9*  PLT 60* 81*   BMET:  Recent Labs  10/09/12 0430 10/10/12 0600  NA 141 140  K 3.6 3.9  CL 107 106  CO2 30 26  GLUCOSE 83 117*  BUN 17 16  CREATININE 1.14 1.11  CALCIUM 8.5 8.9    PT/INR: No results found for this basename: LABPROT, INR,  in the last 72 hours ABG    Component Value Date/Time   PHART 7.348* 10/08/2012 0212   HCO3 24.8* 10/08/2012 0212   TCO2 26 10/08/2012 1718   ACIDBASEDEF 1.0 10/08/2012 0212   O2SAT 99.0 10/08/2012 0212   CBG (last 3)   Recent Labs  10/10/12 2111 10/11/12 0546 10/11/12 0612  GLUCAP 150* 136* 134*    Assessment/Plan: S/P Procedure(s) (LRB): CORONARY ARTERY BYPASS GRAFTING (CABG) (N/A) INTRAOPERATIVE TRANSESOPHAGEAL ECHOCARDIOGRAM (N/A)  1. CV- NSR rate controlled, mildly hypertensive will restart home Altace 2. Pulm- no acute issues,  encouraged IS at discharge 3. Volume status stable 4. CBGs, controlled, not a diabetic will d/c insulin at discharge 5. Dispo- patient doing well, will d/c home today   LOS: 9 days    BARRETT, ERIN 10/11/2012  Patient seen and examined. Agree with above

## 2012-10-11 NOTE — Progress Notes (Signed)
Patient alert and oriented x4.  Family at the bedside.  Pt given discharge instructions.  Pt denies any questions or concerns.  Pt also given prescriptions and cxr referral.  IV access removed, cannula intact, site clean, dry and intact no redness, bruising, bleeding.  Pt tolerated removal well.  Pt discharged home per wheelchair with NT to private vehicle.

## 2012-11-05 ENCOUNTER — Other Ambulatory Visit: Payer: Self-pay | Admitting: *Deleted

## 2012-11-05 DIAGNOSIS — I2510 Atherosclerotic heart disease of native coronary artery without angina pectoris: Secondary | ICD-10-CM

## 2012-11-10 ENCOUNTER — Ambulatory Visit (INDEPENDENT_AMBULATORY_CARE_PROVIDER_SITE_OTHER): Payer: Self-pay | Admitting: Thoracic Surgery (Cardiothoracic Vascular Surgery)

## 2012-11-10 ENCOUNTER — Ambulatory Visit
Admission: RE | Admit: 2012-11-10 | Discharge: 2012-11-10 | Disposition: A | Discharge status: Home / Self Care | Payer: PRIVATE HEALTH INSURANCE | Source: Ambulatory Visit | Attending: Thoracic Surgery (Cardiothoracic Vascular Surgery) | Admitting: Thoracic Surgery (Cardiothoracic Vascular Surgery)

## 2012-11-10 ENCOUNTER — Encounter: Payer: Self-pay | Admitting: Thoracic Surgery (Cardiothoracic Vascular Surgery)

## 2012-11-10 ENCOUNTER — Other Ambulatory Visit: Payer: Self-pay | Admitting: *Deleted

## 2012-11-10 VITALS — BP 136/72 | HR 70 | Resp 18 | Ht 71.0 in | Wt 162.0 lb

## 2012-11-10 DIAGNOSIS — I2510 Atherosclerotic heart disease of native coronary artery without angina pectoris: Secondary | ICD-10-CM

## 2012-11-10 DIAGNOSIS — Z951 Presence of aortocoronary bypass graft: Secondary | ICD-10-CM

## 2012-11-10 MED ORDER — METOPROLOL TARTRATE 25 MG PO TABS: 50.0000 mg | ORAL_TABLET | Freq: Two times a day (BID) | ORAL | Status: DC

## 2012-11-10 NOTE — Patient Instructions (Signed)
Increase metoprolol to 50 mg (2 tablets) by mouth twice daily  The patient is encouraged to enroll and participate in the outpatient cardiac rehab program beginning as soon as practical.  The patient may return to driving an automobile as long as they are no longer requiring oral narcotic pain relievers during the daytime.  It would be wise to start driving only short distances during the daylight and gradually increase from there as they feel comfortable.  The patient should continue to avoid any heavy lifting or strenuous use of arms or shoulders for at least a total of three months from the time of surgery.

## 2012-11-10 NOTE — Progress Notes (Signed)
[address]            [name] [address]          [phone]     CARDIOTHORACIC SURGERY OFFICE NOTE  Referring Provider is Rollene Rotunda, MD PCP is Colette Ribas, MD   HPI:  Patient returns for followup status post coronary artery bypass grafting x3 on 10/07/2012. His postoperative recovery has been uncomplicated. Since hospital discharge the patient has continued to do exceptionally well. He reports mild residual soreness in his chest. He is no longer taking any sort of pain relievers other than Tylenol. His appetite is good. He has no shortness of breath. He is walking every day and he reports it is walking better than it was prior to surgery. He is eager to get out and play golf.  He has been keeping track of his pulse, blood pressure, and blood sugars on a daily basis. His blood sugars have been under very good control. His blood pressure and pulse remained quite stable although his resting pulse remains oftentimes in the 90s. Overall he has no complaints.   Current Outpatient Prescriptions  Medication Sig Dispense Refill  . ALPRAZolam (XANAX) 0.5 MG tablet Take 0.5 mg by mouth at bedtime as needed. Sleep      . aspirin EC 325 MG EC tablet Take 1 tablet (325 mg total) by mouth daily.  30 tablet    . atorvastatin (LIPITOR) 20 MG tablet Take 20 mg by mouth daily.      Marland Kitchen docusate sodium 100 MG CAPS Take 200 mg by mouth 2 (two) times daily as needed for constipation.  10 capsule    . fish oil-omega-3 fatty acids 1000 MG capsule Take 1 capsule by mouth 2 (two) times daily.        . Linagliptin-Metformin HCl (JENTADUETO) 2.5-500 MG TABS Take 2 tablets by mouth 2 (two) times daily.       . metoprolol tartrate (LOPRESSOR) 25 MG tablet Take 2 tablets (50 mg total) by mouth 2 (two) times daily.  90 tablet  3  . oxyCODONE (OXY IR/ROXICODONE) 5 MG immediate release tablet Take 1-2 tablets (5-10 mg total) by mouth every 4 (four) hours as needed.  50  tablet  0  . pantoprazole (PROTONIX) 20 MG tablet Take 20 mg by mouth daily.       . ramipril (ALTACE) 10 MG tablet Take 10 mg by mouth daily.        . tadalafil (CIALIS) 10 MG tablet Take 10 mg by mouth daily as needed.      . zolpidem (AMBIEN) 10 MG tablet Take 10 mg by mouth at bedtime as needed. Sleep       No current facility-administered medications for this visit.      Physical Exam:   BP 136/72  Pulse 70  Resp 18  Ht 5\' 11"  (1.803 m)  Wt 162 lb (73.483 kg)  BMI 22.6 kg/m2  SpO2 98%  General:  Well-appearing  Chest:   Clear to auscultation with symmetrical breath sounds  CV:   Regular rate and rhythm without murmur  Incisions:  Clean and dry and healing nicely, sternum is stable  Abdomen:  Soft and nontender  Extremities:  Warm and well-perfused  Diagnostic Tests:  *RADIOLOGY REPORT*  Clinical Data: 62 year old male status post heart surgery in April.  CHEST - 2 VIEW  Comparison: 10/10/2012 and  earlier.  Findings: Improved lung volumes. Interval resolved right pleural  effusion, while there is persistent posterior left costophrenic  angle scarring or fluid. No pneumothorax or pulmonary edema.  Stable cardiac size and mediastinal contours. Sequelae of CABG.  Visualized tracheal air column is within normal limits. No acute  osseous abnormality identified.  IMPRESSION:  Persistent small left pleural effusion or pleural scarring. No  other acute cardiopulmonary abnormality.  Original Report Authenticated By: Erskine Speed, M.D.    Impression:  Patient is doing exceptionally well approximately 5 weeks status post coronary artery bypass grafting.  Plan:  I've encouraged patient to continue to gradually increase his physical activity as tolerated but I've reminded him to refrain from any sort of heavy lifting or strenuous use of his arms or shoulders for least another 2 months. I think he can resume driving an automobile. I've encouraged him to participate in the  cardiac rehabilitation program. We have also instructed him to increase his dose of metoprolol to 50 mg twice daily. The patient has been reminded to call and schedule an appointment with his cardiologist as soon as practical.  In the future he will call and return to see [place] as needed. All of his questions been addressed.   Salvatore Decent. Cornelius Moras, MD 11/10/2012 1:53 PM

## 2012-11-13 ENCOUNTER — Encounter (HOSPITAL_COMMUNITY): Payer: PRIVATE HEALTH INSURANCE

## 2012-11-27 ENCOUNTER — Encounter: Payer: Self-pay | Admitting: *Deleted

## 2012-11-27 ENCOUNTER — Encounter: Payer: Self-pay | Admitting: Cardiology

## 2012-11-27 ENCOUNTER — Ambulatory Visit (INDEPENDENT_AMBULATORY_CARE_PROVIDER_SITE_OTHER): Payer: PRIVATE HEALTH INSURANCE | Admitting: Cardiology

## 2012-11-27 VITALS — BP 132/85 | HR 77 | Ht 71.0 in | Wt 164.8 lb

## 2012-11-27 DIAGNOSIS — D696 Thrombocytopenia, unspecified: Secondary | ICD-10-CM

## 2012-11-27 DIAGNOSIS — I714 Abdominal aortic aneurysm, without rupture, unspecified: Secondary | ICD-10-CM

## 2012-11-27 DIAGNOSIS — E785 Hyperlipidemia, unspecified: Secondary | ICD-10-CM

## 2012-11-27 DIAGNOSIS — I7090 Unspecified atherosclerosis: Secondary | ICD-10-CM

## 2012-11-27 DIAGNOSIS — I10 Essential (primary) hypertension: Secondary | ICD-10-CM

## 2012-11-27 DIAGNOSIS — Z951 Presence of aortocoronary bypass graft: Secondary | ICD-10-CM

## 2012-11-27 DIAGNOSIS — I2510 Atherosclerotic heart disease of native coronary artery without angina pectoris: Secondary | ICD-10-CM

## 2012-11-27 NOTE — Patient Instructions (Addendum)
Your physician recommends that you schedule a follow-up appointment in: 5 MONTHS  Your physician has recommended you make the following change in your medication:   1) DECREASE METOPROLOL TO 25MG  TWICE DAILY  Your physician recommends THAT YOU RETURN TO WORK IN ONE MONTH  Your physician recommends that you increase your activity are tolerated. ATTEMPT TO RESUME GOLF IN 6 WEEKS  Your physician recommends THAT YOU CHECK YOUR BLOOD PRESSURE OCCASIONALLY, CALL OFFICE IF THE NUMBERS ARE ABOVE 140/90 FOR 3-5 CONSECUTIVE DAYS

## 2012-11-27 NOTE — Assessment & Plan Note (Signed)
Patient is doing well following CABG. He is advised that he can resume work in 4 weeks and attempt to return to golf game in 6 weeks.

## 2012-11-27 NOTE — Progress Notes (Signed)
Patient ID: [name], male   DOB: [birth year], 62 y.o.   MRN: [id number]  HPI: Schedule return visit for this nice gentleman who recently underwent uncomplicated CABG surgery.  His first manifestation of coronary disease was 15 years ago when he underwent stent placement in the LAD. He subsequently required stenting of the RCA in 2009 but presented with recurrent unstable angina in April of this year and underwent 3 vessel CABG. He is recovered well, walking on his own and experienced some chest tightness but no pain. His incision is healed completely. He's been released from further followup with his surgeon, Dr. Cornelius Thompson.  Current Outpatient Prescriptions  Medication Sig Dispense Refill  . ALPRAZolam (XANAX) 0.5 MG tablet Take 0.5 mg by mouth at bedtime as needed. Sleep      . aspirin EC 325 MG EC tablet Take 1 tablet (325 mg total) by mouth daily.  30 tablet    . atorvastatin (LIPITOR) 20 MG tablet Take 20 mg by mouth daily.      . fish oil-omega-3 fatty acids 1000 MG capsule Take 1 capsule by mouth 2 (two) times daily.        . Linagliptin-Metformin HCl (JENTADUETO) 2.5-500 MG TABS Take 2 tablets by mouth 2 (two) times daily.       . metoprolol tartrate (LOPRESSOR) 25 MG tablet Take 25 mg by mouth 2 (two) times daily.      Marland Kitchen oxyCODONE (OXY IR/ROXICODONE) 5 MG immediate release tablet Take 1-2 tablets (5-10 mg total) by mouth every 4 (four) hours as needed.  50 tablet  0  . pantoprazole (PROTONIX) 20 MG tablet Take 20 mg by mouth daily.       . ramipril (ALTACE) 10 MG tablet Take 10 mg by mouth daily.        . tadalafil (CIALIS) 10 MG tablet Take 10 mg by mouth daily as needed.      . zolpidem (AMBIEN) 10 MG tablet Take 10 mg by mouth at bedtime as needed. Sleep       No current facility-administered medications for this visit.   Allergies  Allergen Reactions  . Meperidine Hcl Other (See Comments)    Was mixed with phenergan and turned blood vessels red.   . Promethazine Hcl Other (See  Comments)    Was mixed with the Demerol and caused veins to turn red.      Past medical history, social history, and family history reviewed and updated.  PHYSICAL EXAM: BP 132/85  Pulse 77  Ht 5\' 11"  (1.803 m)  Wt 74.73 kg (164 lb 12 oz)  BMI 22.99 kg/m2;  Body mass index is 22.99 kg/(m^2). General-Well developed; no acute distress Body habitus-proportionate weight and height Neck-No JVD; no carotid bruits Lungs-clear lung fields; resonant to percussion; well-healed median sternotomy incision without tenderness or instability. Cardiovascular-normal PMI; normal S1 and S2; prominent S4 Abdomen-normal bowel sounds; soft and non-tender without masses or organomegaly Musculoskeletal-No deformities, no cyanosis or clubbing Neurologic-Normal cranial nerves; symmetric strength and tone Skin-Warm, no significant lesions Extremities-distal pulses intact; no edema  EKG: Normal sinus rhythm; lateral T-wave inversions; delayed R-wave progression. No previous tracing for comparison.  [place] Bing, MD 11/27/2012  4:01 PM  ASSESSMENT AND PLAN

## 2012-11-27 NOTE — Assessment & Plan Note (Signed)
Extremely low total and LDL cholesterol on current therapy, which will be continued.

## 2012-11-27 NOTE — Assessment & Plan Note (Addendum)
Significant increase in size of abdominal aortic aneurysm between 2012 and 2014; repeat imaging study in one year is warranted.

## 2012-11-27 NOTE — Progress Notes (Signed)
Name: [name]    DOB: [birth year]  Age: 62 y.o.  MR#: [id number]       PCP:  Colette Ribas, MD      Insurance: Payor: MEDCOST / Plan: MEDCOST / Product Type: *No Product type* /   CC:   No chief complaint on file. PT DISCHARGE LIST FROM [place] WITH WRITTEN ADJUSTMENTS   VS Filed Vitals:   11/27/12 1431  BP: 132/85  Pulse: 77  Height: 5\' 11"  (1.803 m)  Weight: 164 lb 12 oz (74.73 kg)    Weights Current Weight  11/27/12 164 lb 12 oz (74.73 kg)  11/10/12 162 lb (73.483 kg)  10/11/12 162 lb 4.1 oz (73.6 kg)    Blood Pressure  BP Readings from Last 3 Encounters:  11/27/12 132/85  11/10/12 136/72  10/11/12 138/68     Admit date:  (Not on file) Last encounter with RMR:  Visit date not found   Allergy Meperidine hcl and Promethazine hcl  Current Outpatient Prescriptions  Medication Sig Dispense Refill  . ALPRAZolam (XANAX) 0.5 MG tablet Take 0.5 mg by mouth at bedtime as needed. Sleep      . aspirin EC 325 MG EC tablet Take 1 tablet (325 mg total) by mouth daily.  30 tablet    . atorvastatin (LIPITOR) 20 MG tablet Take 20 mg by mouth daily.      . fish oil-omega-3 fatty acids 1000 MG capsule Take 1 capsule by mouth 2 (two) times daily.        . Linagliptin-Metformin HCl (JENTADUETO) 2.5-500 MG TABS Take 2 tablets by mouth 2 (two) times daily.       . metoprolol tartrate (LOPRESSOR) 25 MG tablet Take 2 tablets (50 mg total) by mouth 2 (two) times daily.  90 tablet  3  . oxyCODONE (OXY IR/ROXICODONE) 5 MG immediate release tablet Take 1-2 tablets (5-10 mg total) by mouth every 4 (four) hours as needed.  50 tablet  0  . pantoprazole (PROTONIX) 20 MG tablet Take 20 mg by mouth daily.       . ramipril (ALTACE) 10 MG tablet Take 10 mg by mouth daily.        . tadalafil (CIALIS) 10 MG tablet Take 10 mg by mouth daily as needed.      . zolpidem (AMBIEN) 10 MG tablet Take 10 mg by mouth at bedtime as needed. Sleep       No current facility-administered medications for  this visit.    Discontinued Meds:    Medications Discontinued During This Encounter  Medication Reason  . docusate sodium 100 MG CAPS Error    Patient Active Problem List   Diagnosis Date Noted  . S/P CABG x 3 10/07/2012  . Chest pain 10/02/2012  . Hypertension 10/02/2012  . Abnormal CT scan, stomach 03/31/2012  . DIABETES MELLITUS, TYPE II 08/09/2010  . HYPERLIPIDEMIA 08/09/2010  . THROMBOCYTOPENIA 08/09/2010  . ATHEROSCLEROTIC CARDIOVASCULAR DISEASE 08/09/2010  . NEPHROLITHIASIS, RECURRENT 08/09/2010  . ERECTILE DYSFUNCTION, ORGANIC 08/09/2010  . INSOMNIA 08/09/2010  . ABDOMINAL AORTIC ANEURYSM 11/07/2009    LABS    Component Value Date/Time   NA 140 10/10/2012 0600   NA 141 10/09/2012 0430   NA 141 10/08/2012 1718   K 3.9 10/10/2012 0600   K 3.6 10/09/2012 0430   K 4.0 10/08/2012 1718   CL 106 10/10/2012 0600   CL 107 10/09/2012 0430   CL 106 10/08/2012 1718   CO2 26 10/10/2012 0600   CO2  30 10/09/2012 0430   CO2 24 10/08/2012 0430   GLUCOSE 117* 10/10/2012 0600   GLUCOSE 83 10/09/2012 0430   GLUCOSE 132* 10/08/2012 1718   BUN 16 10/10/2012 0600   BUN 17 10/09/2012 0430   BUN 15 10/08/2012 1718   CREATININE 1.11 10/10/2012 0600   CREATININE 1.14 10/09/2012 0430   CREATININE 0.90 10/08/2012 1718   CALCIUM 8.9 10/10/2012 0600   CALCIUM 8.5 10/09/2012 0430   CALCIUM 8.5 10/08/2012 0430   GFRNONAA 71* 10/10/2012 0600   GFRNONAA 69* 10/09/2012 0430   GFRNONAA 89* 10/08/2012 1700   GFRAA 82* 10/10/2012 0600   GFRAA 80* 10/09/2012 0430   GFRAA >90 10/08/2012 1700   CMP     Component Value Date/Time   NA 140 10/10/2012 0600   K 3.9 10/10/2012 0600   CL 106 10/10/2012 0600   CO2 26 10/10/2012 0600   GLUCOSE 117* 10/10/2012 0600   BUN 16 10/10/2012 0600   CREATININE 1.11 10/10/2012 0600   CALCIUM 8.9 10/10/2012 0600   PROT 6.9 10/05/2012 0446   ALBUMIN 3.7 10/05/2012 0446   AST 20 10/05/2012 0446   ALT 33 10/05/2012 0446   ALKPHOS 76 10/05/2012 0446   BILITOT 0.4 10/05/2012 0446   GFRNONAA 71* 10/10/2012  0600   GFRAA 82* 10/10/2012 0600       Component Value Date/Time   WBC 8.8 10/10/2012 0600   WBC 8.6 10/09/2012 0430   WBC 10.8* 10/08/2012 1700   HGB 11.7* 10/10/2012 0600   HGB 11.3* 10/09/2012 0430   HGB 11.2* 10/08/2012 1718   HCT 34.9* 10/10/2012 0600   HCT 33.4* 10/09/2012 0430   HCT 33.0* 10/08/2012 1718   MCV 85.3 10/10/2012 0600   MCV 85.6 10/09/2012 0430   MCV 85.2 10/08/2012 1700    Lipid Panel     Component Value Date/Time   CHOL 77 10/04/2012 0515   TRIG 113 10/04/2012 0515   HDL 30* 10/04/2012 0515   CHOLHDL 2.6 10/04/2012 0515   VLDL 23 10/04/2012 0515   LDLCALC 24 10/04/2012 0515   LDLCALC 86 07/28/2010    ABG    Component Value Date/Time   PHART 7.348* 10/08/2012 0212   PCO2ART 45.2* 10/08/2012 0212   PO2ART 145.0* 10/08/2012 0212   HCO3 24.8* 10/08/2012 0212   TCO2 26 10/08/2012 1718   ACIDBASEDEF 1.0 10/08/2012 0212   O2SAT 99.0 10/08/2012 0212     Lab Results  Component Value Date   TSH 0.62 04/13/2008   BNP (last 3 results) No results found for this basename: PROBNP,  in the last 8760 hours Cardiac Panel (last 3 results) No results found for this basename: CKTOTAL, CKMB, TROPONINI, RELINDX,  in the last 72 hours  Iron/TIBC/Ferritin No results found for this basename: iron, tibc, ferritin     EKG Orders placed in visit on 11/27/12  . EKG 12-LEAD     Prior Assessment and Plan Problem List as of 11/27/2012   ABDOMINAL AORTIC ANEURYSM   Last Assessment & Plan   02/26/2012 Office Visit Written 02/26/2012  5:59 PM by Kathlen Brunswick, MD     Abdominal aortic aneurysm remains minimal and likely will not present any clinical issues for quite a few years.  Repeat imaging anticipated in approximately 2 years.    DIABETES MELLITUS, TYPE II   HYPERLIPIDEMIA   Last Assessment & Plan   02/26/2012 Office Visit Written 02/27/2012 10:01 AM by Kathlen Brunswick, MD     Control of hyperlipidemia was adequate when  last assessed in 2012; repeat profile will be obtained.    THROMBOCYTOPENIA    Last Assessment & Plan   02/26/2012 Office Visit Edited 02/27/2012 10:02 AM by Kathlen Brunswick, MD     Dr. Mariel Sleet assessed thrombocytopenia in 2007.  Apparently his diagnosis was mild idiopathic thrombocytopenia purpura requiring no specific therapy.    ATHEROSCLEROTIC CARDIOVASCULAR DISEASE   Last Assessment & Plan   02/26/2012 Office Visit Written 02/27/2012  9:56 AM by Kathlen Brunswick, MD     Patient is doing well symptomatically with respect to coronary disease.  He reports nonspecific symptoms, which could reflect an adverse effect of beta blocker.  The dose of metoprolol will be decreased by 50%.  He will call if control of hypertension is lost or if fatigue and exercise intolerance persists.    NEPHROLITHIASIS, RECURRENT   Last Assessment & Plan   02/26/2012 Office Visit Written 02/26/2012  5:48 PM by Kathlen Brunswick, MD     Patient has had multiple frequent recurrent episodes of symptomatic nephrolithiasis.  He is followed by Dr. Annabell Howells for this problem.    ERECTILE DYSFUNCTION, ORGANIC   INSOMNIA   Last Assessment & Plan   02/26/2012 Office Visit Edited 03/02/2012  1:51 PM by Kathlen Brunswick, MD     Patient reports using hypnotics 3-4 times per week.  He does not note mental clouding in the morning.  I suggest he attempt to minimize use of these agents but to continue to take them as necessary to provide reasonable sleep.     Abnormal CT scan, stomach   Chest pain   Hypertension   S/P CABG x 3       Imaging: Dg Chest 2 View  11/10/2012   *RADIOLOGY REPORT*  Clinical Data: 62 year old male status post heart surgery in April.  CHEST - 2 VIEW  Comparison: 10/10/2012 and earlier.  Findings: Improved lung volumes.  Interval resolved right pleural effusion, while there is persistent posterior left costophrenic angle scarring or fluid.  No pneumothorax or pulmonary edema. Stable cardiac size and mediastinal contours.  Sequelae of CABG. Visualized tracheal air column is within  normal limits.  No acute osseous abnormality identified.  IMPRESSION: Persistent small left pleural effusion or pleural scarring.  No other acute cardiopulmonary abnormality.   Original Report Authenticated By: Erskine Speed, M.D.

## 2012-11-27 NOTE — Assessment & Plan Note (Signed)
Significant thrombocytopenia persists and is in fact somewhat worse than in 01/2012. In the absence of a bleeding diathesis, no further evaluation or treatment is warranted at present.

## 2012-11-27 NOTE — Assessment & Plan Note (Signed)
No blood pressure elevations identified within the past few months. Metoprolol dosage will be decreased. Patient advised to check blood pressure on occasion and to notify us of any significant elevations.

## 2012-12-22 ENCOUNTER — Other Ambulatory Visit (INDEPENDENT_AMBULATORY_CARE_PROVIDER_SITE_OTHER): Payer: Self-pay | Admitting: Internal Medicine

## 2012-12-22 ENCOUNTER — Telehealth (INDEPENDENT_AMBULATORY_CARE_PROVIDER_SITE_OTHER): Payer: Self-pay | Admitting: Internal Medicine

## 2012-12-22 NOTE — Progress Notes (Signed)
Rx for Nexium given to patient.

## 2012-12-22 NOTE — Telephone Encounter (Signed)
Rx for Nexium 24 # 30 given to patient. Unable to eprescribe this medication.

## 2012-12-31 ENCOUNTER — Other Ambulatory Visit: Payer: Self-pay | Admitting: Thoracic Surgery (Cardiothoracic Vascular Surgery)

## 2013-01-07 ENCOUNTER — Telehealth (INDEPENDENT_AMBULATORY_CARE_PROVIDER_SITE_OTHER): Payer: Self-pay | Admitting: Internal Medicine

## 2013-01-07 NOTE — Telephone Encounter (Signed)
Here today in office. Requesting refill on his Nexium 24.

## 2013-01-07 NOTE — Telephone Encounter (Signed)
Rx called to pharmacy for Nexium OTC

## 2013-01-14 ENCOUNTER — Other Ambulatory Visit: Payer: Self-pay | Admitting: Thoracic Surgery (Cardiothoracic Vascular Surgery)

## 2013-01-14 ENCOUNTER — Other Ambulatory Visit: Payer: Self-pay | Admitting: Cardiology

## 2013-01-14 ENCOUNTER — Other Ambulatory Visit: Payer: Self-pay | Admitting: *Deleted

## 2013-01-14 MED ORDER — METOPROLOL TARTRATE 25 MG PO TABS: 25.0000 mg | ORAL_TABLET | Freq: Two times a day (BID) | ORAL | Status: DC

## 2013-01-14 NOTE — Telephone Encounter (Signed)
Pt needs metoprolol called in today please per wife call./tmj

## 2013-01-16 ENCOUNTER — Other Ambulatory Visit: Payer: Self-pay | Admitting: Thoracic Surgery (Cardiothoracic Vascular Surgery)

## 2013-04-15 ENCOUNTER — Other Ambulatory Visit: Payer: Self-pay | Admitting: Cardiology

## 2013-04-30 ENCOUNTER — Encounter: Payer: Self-pay | Admitting: Cardiology

## 2013-04-30 ENCOUNTER — Ambulatory Visit (INDEPENDENT_AMBULATORY_CARE_PROVIDER_SITE_OTHER): Payer: PRIVATE HEALTH INSURANCE | Admitting: Cardiology

## 2013-04-30 VITALS — BP 157/83 | HR 69 | Ht 71.0 in | Wt 174.0 lb

## 2013-04-30 DIAGNOSIS — I25810 Atherosclerosis of coronary artery bypass graft(s) without angina pectoris: Secondary | ICD-10-CM

## 2013-04-30 DIAGNOSIS — I10 Essential (primary) hypertension: Secondary | ICD-10-CM

## 2013-04-30 MED ORDER — RAMIPRIL 10 MG PO CAPS: 20.0000 mg | ORAL_CAPSULE | Freq: Every day | ORAL | Status: DC

## 2013-04-30 MED ORDER — ASPIRIN EC 81 MG PO TBEC: 81.0000 mg | DELAYED_RELEASE_TABLET | Freq: Every day | ORAL | Status: DC

## 2013-04-30 NOTE — Patient Instructions (Addendum)
Your physician recommends that you schedule a follow-up appointment in: 4 months  Your physician has recommended you make the following change in your medication:  Increase Ramipril to 20 mg daily, reduce Asprin to 81 mg daily.  Your physician recommends that you return for lab work in:  In three weeks BMET  Nurse visit for BP check in one month

## 2013-04-30 NOTE — Progress Notes (Signed)
Clinical Summary [name] is a 62 y.o.male seen here in follow up for the following medical problems.  1. CAD - prior 3 vessel CABG in 09/2012 (LIMA to LAD, SVG to ramus, SVG to OM), history of PCI prior to that. 09/2012 LVEF 60-65% by echo - no recent chest pain, occsasional msk pain at the incision site. No SOB, no DOE. Walks regularly for 25 minutes or so without limitations. No orthopnea, no PND  Compliant with meds: ASA, atorva, metoprolol, ramipril  2. HTN - checks bp at home daily. Typically 160s/90s.  - lasts visit metoprolol was decreased in 10/2012  3. HL - compliant with statin - 09/2012: TC 77 TG 113 HDL 30 LDL 24  4. AAA - stable from most recent ultrasound - denies any abdominal symptoms   Past Medical History  Diagnosis Date  . ASCVD (arteriosclerotic cardiovascular disease)     LAD stent in 1999; RCA bare-metal stent in 12/2007, LAD stent patent, 50-60% proximal Cx lesion treated medically; 09/2012: CABG x3, LIMA to LAD, Sequential SVG to ramus intermediate and OM [name] with EVH via right thigh  . AAA (abdominal aortic aneurysm)     3.3 cm in 2012  . Hyperlipidemia   . Diabetes mellitus   . Thrombocytopenia     Chronic; evaluated by Dr. Mariel Thompson in 2007  . Tobacco abuse   . Hepatic steatosis   . ED (erectile dysfunction)   . Nephrolithiasis   . Anxiety   . Depression   . Insomnia   . Gastric ulcer   . Coronary artery disease   . MI (myocardial infarction)   . THROMBOCYTOPENIA 08/09/2010    Platelets 88-114,000 over the past 4 years; prior evaluation by Dr. Mariel Thompson without a specific etiology identified.  01/2012: Normal CBC ex platelets-120   . ABDOMINAL AORTIC ANEURYSM 11/07/2009    Diameter of 3.3 cm by CT in 12/2010   . NEPHROLITHIASIS, RECURRENT 08/09/2010    Obstruction of the ureter on CT scan of the abdomen resulting in right hydronephrosis in 12/2010; bladder outlet obstruction prior to that.  CT also revealed atherosclerotic changes in the  abdominal arterial vessels, hepatic granuloma and calcification of the prostate.   Marland Kitchen HYPERLIPIDEMIA 08/09/2010    Lipid profile in 07/2010:144, 124, 34, 86; 01/2012:93, 82, 34, 43   . DIABETES MELLITUS, TYPE II 08/09/2010    no insulin    . S/P CABG x 3 10/07/2012    LIMA to LAD, Sequential SVG to ramus intermediate and OM [name] with EVH via right thigh     Allergies  Allergen Reactions  . Meperidine Hcl Other (See Comments)    Was mixed with phenergan and turned blood vessels red.   . Promethazine Hcl Other (See Comments)    Was mixed with the Demerol and caused veins to turn red.      Current Outpatient Prescriptions  Medication Sig Dispense Refill  . ALPRAZolam (XANAX) 0.5 MG tablet Take 0.5 mg by mouth at bedtime as needed. Sleep      . aspirin EC 325 MG EC tablet Take 1 tablet (325 mg total) by mouth daily.  30 tablet    . atorvastatin (LIPITOR) 20 MG tablet Take 20 mg by mouth daily.      . fish oil-omega-3 fatty acids 1000 MG capsule Take 1 capsule by mouth 2 (two) times daily.        . Linagliptin-Metformin HCl (JENTADUETO) 2.5-500 MG TABS Take 2 tablets by mouth 2 (two) times  daily.       . metoprolol tartrate (LOPRESSOR) 25 MG tablet TAKE 1 TABLET (25 MG TOTAL) BY MOUTH 2 (TWO) TIMES DAILY.  60 tablet  3  . oxyCODONE (OXY IR/ROXICODONE) 5 MG immediate release tablet Take 1-2 tablets (5-10 mg total) by mouth every 4 (four) hours as needed.  50 tablet  0  . pantoprazole (PROTONIX) 20 MG tablet Take 20 mg by mouth daily.       . ramipril (ALTACE) 10 MG tablet Take 10 mg by mouth daily.        . tadalafil (CIALIS) 10 MG tablet Take 10 mg by mouth daily as needed.      . zolpidem (AMBIEN) 10 MG tablet Take 10 mg by mouth at bedtime as needed. Sleep       No current facility-administered medications for this visit.     Past Surgical History  Procedure Laterality Date  . Lithotripsy    . Inguinal hernia repair    . Coronary angioplasty with stent placement      1999, 2009  .  Esophagogastroduodenoscopy  04/04/2012    Procedure: ESOPHAGOGASTRODUODENOSCOPY (EGD);  Surgeon: Guy Hippo, MD;  Location: [facility];  Service: Endoscopy;  Laterality: N/A;  830  . Esophagogastroduodenoscopy  07/25/2012    Procedure: ESOPHAGOGASTRODUODENOSCOPY (EGD);  Surgeon: Guy Hippo, MD;  Location: [facility];  Service: Endoscopy;  Laterality: N/A;  830  . Coronary artery bypass graft N/A 10/07/2012    Procedure: CORONARY ARTERY BYPASS GRAFTING (CABG);  Surgeon: Guy Nails, MD;  Location: [facility] OR;  Service: Open Heart Surgery;  Laterality: N/A;  . Intraoperative transesophageal echocardiogram N/A 10/07/2012    Procedure: INTRAOPERATIVE TRANSESOPHAGEAL ECHOCARDIOGRAM;  Surgeon: Guy Nails, MD;  Location: [facility] OR;  Service: Open Heart Surgery;  Laterality: N/A;     Allergies  Allergen Reactions  . Meperidine Hcl Other (See Comments)    Was mixed with phenergan and turned blood vessels red.   . Promethazine Hcl Other (See Comments)    Was mixed with the Demerol and caused veins to turn red.       Family History  Problem Relation Age of Onset  . Ovarian cancer Sister      Social History Mr. Littles reports that he has been smoking Cigarettes.  He has a 20 pack-year smoking history. He does not have any smokeless tobacco history on file. Mr. Haug reports that he does not drink alcohol.   Review of Systems CONSTITUTIONAL: No weight loss, fever, chills, weakness or fatigue.  HEENT: Eyes: No visual loss, blurred vision, double vision or yellow sclerae.No hearing loss, sneezing, congestion, runny nose or sore throat.  SKIN: No rash or itching.  CARDIOVASCULAR: per HPI RESPIRATORY:per HPI GASTROINTESTINAL: No anorexia, nausea, vomiting or diarrhea. No abdominal pain or blood.  GENITOURINARY: No burning on urination, no polyuria NEUROLOGICAL: No headache, dizziness, syncope, paralysis, ataxia, numbness or tingling in the extremities. No change in bowel or bladder  control.  MUSCULOSKELETAL: No muscle, back pain, joint pain or stiffness.  LYMPHATICS: No enlarged nodes. No history of splenectomy.  PSYCHIATRIC: No history of depression or anxiety.  ENDOCRINOLOGIC: No reports of sweating, cold or heat intolerance. No polyuria or polydipsia.  Marland Kitchen   Physical Examination p 69 bp 157/83 Wt 174 lbs BMI 24 Gen: resting comfortably, no acute distress HEENT: no scleral icterus, pupils equal round and reactive, no palptable cervical adenopathy,  CV: RRR, no m/r/g, no JVD, no carotid bruits Resp: Clear to auscultation  bilaterally GI: abdomen is soft, non-tender, non-distended, normal bowel sounds, no hepatosplenomegaly MSK: extremities are warm, no edema.  Skin: warm, no rash Neuro:  no focal deficits Psych: appropriate affect   Diagnostic Studies 09/2012 Echo: LVEF 55-60%, no WMAs,   09/2012 AAA [place]: 3.3 x 3.9 cm, stable from prior CT    Assessment and Plan  1. CAD - no current symptoms - continue risk factor modification and secondary prevention - change ASA to 81mg  daily  2. HTN - not at goal, given his DM goal bp < 130/80 - will increase ramipril to 20 mg daily, he will have a BMET in 3 weeks and a nursing appt in 4 weeks for a bp check - educated on salt limitation and DASH diet  3. HL - LDL at goal, continue current statin. Educated on diet and exercise to increase HDL  4. AAA - stable from [place] 09/2012, repeat in 1 year   [name], M.D., F.A.C.C.

## 2013-05-08 ENCOUNTER — Other Ambulatory Visit: Payer: Self-pay

## 2013-05-08 ENCOUNTER — Telehealth: Payer: Self-pay

## 2013-05-08 DIAGNOSIS — I10 Essential (primary) hypertension: Secondary | ICD-10-CM

## 2013-05-08 NOTE — Telephone Encounter (Signed)
Mailed lab slip for bmet

## 2013-05-25 ENCOUNTER — Telehealth: Payer: Self-pay | Admitting: *Deleted

## 2013-05-25 ENCOUNTER — Ambulatory Visit (INDEPENDENT_AMBULATORY_CARE_PROVIDER_SITE_OTHER): Payer: PRIVATE HEALTH INSURANCE

## 2013-05-25 VITALS — BP 136/84

## 2013-05-25 DIAGNOSIS — I10 Essential (primary) hypertension: Secondary | ICD-10-CM

## 2013-05-25 MED ORDER — METOPROLOL TARTRATE 25 MG PO TABS: 25.0000 mg | ORAL_TABLET | Freq: Two times a day (BID) | ORAL | Status: DC

## 2013-05-25 NOTE — Telephone Encounter (Signed)
PT NEEDS METOPROLOL TART 25MG   NEEDS CALLED IN TO [facility] IN [place]

## 2013-05-25 NOTE — Progress Notes (Signed)
Pt has BP check today but failed to have blood work done.Given new orders

## 2013-05-25 NOTE — Patient Instructions (Signed)
I will message Dr.Branch with your BP results and contact you with future plans

## 2013-05-26 LAB — BASIC METABOLIC PANEL
BUN: 18 mg/dL (ref 6–23)
Chloride: 106 mEq/L (ref 96–112)
Potassium: 4.1 mEq/L (ref 3.5–5.3)
Sodium: 141 mEq/L (ref 135–145)

## 2013-06-03 NOTE — Progress Notes (Signed)
His blood pressure is much better and nearly at goal. Continue low salt diet and exercise, if looks just a little bit better next visit we will not have to start a new medicine. He needs to get his labs done

## 2013-06-03 NOTE — Progress Notes (Signed)
Saw that his labs were done, they look good.

## 2013-06-04 ENCOUNTER — Telehealth: Payer: Self-pay

## 2013-06-04 NOTE — Telephone Encounter (Signed)
Pt made aware lab results are good

## 2013-06-04 NOTE — Telephone Encounter (Signed)
Message copied by Nori Riis on Thu Jun 04, 2013  8:13 AM ------      Message from: [place] F      Created: Wed Jun 03, 2013  8:33 PM                   ----- Message -----         From: Nori Riis, RN         Sent: 05/26/2013   3:36 PM           To: Antoine Poche, MD             ------

## 2013-06-04 NOTE — Telephone Encounter (Signed)
Pt notified of MD message regarding bp results,

## 2013-06-04 NOTE — Telephone Encounter (Signed)
Message copied by Nori Riis on Thu Jun 04, 2013  8:16 AM ------      Message from: [place] F      Created: Wed Jun 03, 2013  8:33 PM                   ----- Message -----         From: Nori Riis, RN         Sent: 05/26/2013   3:36 PM           To: Antoine Poche, MD             ------

## 2013-07-03 ENCOUNTER — Other Ambulatory Visit: Payer: Self-pay | Admitting: Urology

## 2013-07-03 ENCOUNTER — Ambulatory Visit (INDEPENDENT_AMBULATORY_CARE_PROVIDER_SITE_OTHER): Payer: PRIVATE HEALTH INSURANCE | Admitting: Urology

## 2013-07-03 ENCOUNTER — Ambulatory Visit (HOSPITAL_COMMUNITY)
Admission: RE | Admit: 2013-07-03 | Discharge: 2013-07-03 | Disposition: A | Discharge status: Home / Self Care | Payer: PRIVATE HEALTH INSURANCE | Source: Ambulatory Visit | Attending: Urology | Admitting: Urology

## 2013-07-03 DIAGNOSIS — N201 Calculus of ureter: Secondary | ICD-10-CM

## 2013-07-03 DIAGNOSIS — N200 Calculus of kidney: Secondary | ICD-10-CM | POA: Insufficient documentation

## 2013-07-03 DIAGNOSIS — R9389 Abnormal findings on diagnostic imaging of other specified body structures: Secondary | ICD-10-CM | POA: Insufficient documentation

## 2013-07-06 ENCOUNTER — Other Ambulatory Visit: Payer: Self-pay | Admitting: Urology

## 2013-07-07 ENCOUNTER — Encounter (HOSPITAL_COMMUNITY): Payer: Self-pay

## 2013-07-08 ENCOUNTER — Encounter (HOSPITAL_COMMUNITY): Payer: Self-pay | Admitting: Pharmacy Technician

## 2013-07-08 NOTE — H&P (Signed)
ctive Problems Problems   1. Benign prostatic hyperplasia with urinary obstruction (600.01,599.69)  2. Bladder calculus (594.1)  3. Calculus of both ureters (592.1)  4. Calculus of right ureter (592.1)  5. Hypercalcemia (275.42)  6. Nephrolithiasis (592.0)  History of Present Illness      [name] came in to see my in [place] on Friday and was sent for a KUB which showed a 93mm right proximal stone and an 83mm left proximal stone.   He had small left renal stones as well.  There were 2 phleboliths in the left pelvis.  He has had a bit of left sided pain but the bulk of the pain is on the right.  He has had no hematuria or voiding complaints.   Past Medical History Problems   1. History of Acute Myocardial Infarction (V12.59)  2. History of Bladder Calculus (V13.01)  3. History of Calculus of left ureter (592.1)  4. History of Calculus of ureter (592.1)  5. History of Gastric ulcer (531.90)  6. History of depression (V11.8)  7. History of diabetes mellitus (V12.29)  8. History of hypercholesterolemia (V12.29)  9. History of hypertension (V12.59)  10. History of Urge incontinence of urine (788.31)  11. History of Urinary retention (788.20)  Surgical History Problems   1. History of CABG (CABG)  2. History of Cath Stent Placement  3. History of Colonoscopic Polypectomy Via Colostomy  4. History of Cystoscopy With Removal Of Object  5. History of Cystoscopy With Ureteroscopy With Removal Of Calculus  6. History of Inguinal Hernia Repair  7. History of Lithotripsy  8. History of Upper Gastrointestinal Endoscopy, Simple Primary Exam  Current Meds  1. Adult Aspirin Low Strength 81 MG Oral Tablet Dispersible;  Therapy: (Recorded:15Jun2012) to Recorded  2. ALPRAZolam 0.5 MG Oral Tablet;  Therapy: (Recorded:12Jul2013) to Recorded  3. AmLODIPine Besylate 2.5 MG Oral Tablet;  Therapy: (Recorded:12Jul2013) to Recorded  4. Cialis 10 MG Oral Tablet;  Therapy: (Recorded:12Jul2013) to  Recorded  5. Fish Oil CAPS;  Therapy: (Recorded:27Dec2010) to Recorded  6. Jentadueto 2.5-500 MG Oral Tablet;  Therapy: (Recorded:12Jul2013) to Recorded  7. Lipitor 20 MG Oral Tablet;  Therapy: (Recorded:15Jun2012) to Recorded  8. Metoprolol Tartrate 100 MG Oral Tablet;  Therapy: (Recorded:27Dec2010) to Recorded  9. Oxycodone-Acetaminophen 5-325 MG Oral Tablet; take 1 or 2 tablets q 4-6 hours prn  pain;  Therapy: [id number] to (Last Rx:02Jan2015) Ordered  10. Ramipril 10 MG Oral Capsule;   Therapy: (Recorded:27Dec2010) to Recorded  11. Zolpidem Tartrate TABS;   Therapy: (Recorded:12Jul2013) to Recorded  Allergies Medication   1. Phenergan TABS  Family History Problems   1. Family history of Death In The Family Father   age 29 of lung problems  2. Family history of Diabetes Mellitus (V18.0)  3. Family history of Family Health Status Number Of Children   1 daughter  2 sons  4. Family history of Nephrolithiasis  Social History Problems   1. Denied: History of Alcohol Use  2. Caffeine Use   1 a day  3. Former tobacco use (V15.82)   quit in April 2014.  4. Marital History - Currently Married  5. Retired From Work   Past and social history reviewed.   Review of Systems  Gastrointestinal: nausea (yesterday. )   The patient presents with complaints of flank pain (right).  Constitutional: no fever.    Vitals Vital Signs [Data Includes: Last 1 Day]  Recorded: [id number] 12:32PM  Blood Pressure: 173 / 100 Temperature: 98.2 F Heart Rate:  80  Results/Data Urine [Data Includes: Last 1 Day]   [id number]  COLOR AMBER   APPEARANCE CLEAR   SPECIFIC GRAVITY 1.025   pH 5.5   GLUCOSE NEG mg/dL  BILIRUBIN NEG   KETONE NEG mg/dL  BLOOD MOD   PROTEIN TRACE mg/dL  UROBILINOGEN 0.2 mg/dL  NITRITE NEG   LEUKOCYTE ESTERASE NEG   SQUAMOUS EPITHELIAL/HPF FEW   WBC 3-6 WBC/hpf  RBC 3-6 RBC/hpf  BACTERIA RARE   CRYSTALS NONE SEEN   CASTS NONE SEEN   Other MUCUS NOTED     The following images/tracing/specimen were independently visualized:  I have reviewed his KUB film and report as noted above. Renal US today shows normal renal length bilaterally. There is no right hydro but mild left hydro. He has severe small simple cysts bilaterally and a 2mm RLP stone along with several small LLP stone and a 4.65mm LMP stone. The PVR is 13cc. See study sheet for measurements on all of the stones and simple cysts.    Assessment Assessed   1. Calculus of both ureters (592.1)   His right proximal stone is about 4x72mm and non-obstructing, although this is the side with pain. The left proximal stone is 62mm and mildly obstructing with only minimal pain.   Plan Calculus of both ureters   1. Follow-up Schedule Surgery Office  Follow-up  Status: Hold For - Appointment   Requested for: [id number]  2. BASIC METABOLIC PANEL; Status:In Progress - Specimen/Data Collected;   Done:  [id number]  3. BUN & CREATININE; Status:Canceled - Specimen/Data Collection,Appointment;   4. VENIPUNCTURE; Status:Complete;   Done: [id number] Health Maintenance   5. UA With REFLEX; [Do Not Release]; Status:Resulted - Requires Verification;   Done:  [id number] 11:24AM Nephrolithiasis   6. RENAL U/S COMPLETE; Status:Resulted - Requires Verification;   Done: [id number]  12:00AM    I discussed the options and will get him set up for left ESWL on Thursday, he will have to hold his ASA. I reviewed the risks in detail including bleeding, infection, injury to the kidney or adjacent organs, need for secondary procedures and sedation risks.     I am going to hold off on treating the right ureteral stone since it maybe small enough to pass and I can't do bilateral treatments.  If that one gives him more pain, I may have to consider stenting and ureteroscopy, but if the pain is manageble, we could consider ESWL on the right at a later date if the stone doesn't pass.    BMP today to assess renal function in the face  of bilateral stones.

## 2013-07-09 ENCOUNTER — Encounter (HOSPITAL_COMMUNITY): Payer: Self-pay

## 2013-07-09 ENCOUNTER — Encounter (HOSPITAL_COMMUNITY)
Admission: RE | Disposition: A | Discharge status: Home / Self Care | Payer: Self-pay | Source: Ambulatory Visit | Attending: Urology

## 2013-07-09 ENCOUNTER — Ambulatory Visit (HOSPITAL_COMMUNITY)
Admission: RE | Admit: 2013-07-09 | Discharge: 2013-07-09 | Disposition: A | Discharge status: Home / Self Care | Payer: PRIVATE HEALTH INSURANCE | Source: Ambulatory Visit | Attending: Urology | Admitting: Urology

## 2013-07-09 ENCOUNTER — Ambulatory Visit (HOSPITAL_COMMUNITY): Payer: PRIVATE HEALTH INSURANCE

## 2013-07-09 DIAGNOSIS — Z951 Presence of aortocoronary bypass graft: Secondary | ICD-10-CM | POA: Insufficient documentation

## 2013-07-09 DIAGNOSIS — E119 Type 2 diabetes mellitus without complications: Secondary | ICD-10-CM | POA: Insufficient documentation

## 2013-07-09 DIAGNOSIS — I10 Essential (primary) hypertension: Secondary | ICD-10-CM | POA: Insufficient documentation

## 2013-07-09 DIAGNOSIS — E7800 Pure hypercholesterolemia, unspecified: Secondary | ICD-10-CM | POA: Insufficient documentation

## 2013-07-09 DIAGNOSIS — Z79899 Other long term (current) drug therapy: Secondary | ICD-10-CM | POA: Insufficient documentation

## 2013-07-09 DIAGNOSIS — N1330 Unspecified hydronephrosis: Secondary | ICD-10-CM | POA: Insufficient documentation

## 2013-07-09 DIAGNOSIS — I252 Old myocardial infarction: Secondary | ICD-10-CM | POA: Insufficient documentation

## 2013-07-09 DIAGNOSIS — N201 Calculus of ureter: Secondary | ICD-10-CM | POA: Diagnosis present

## 2013-07-09 DIAGNOSIS — Z7982 Long term (current) use of aspirin: Secondary | ICD-10-CM | POA: Insufficient documentation

## 2013-07-09 LAB — GLUCOSE, CAPILLARY
GLUCOSE-CAPILLARY: 291 mg/dL — AB (ref 70–99)
Glucose-Capillary: 116 mg/dL — ABNORMAL HIGH (ref 70–99)

## 2013-07-09 SURGERY — LITHOTRIPSY, ESWL
Anesthesia: LOCAL | Laterality: Left

## 2013-07-09 MED ORDER — DIPHENHYDRAMINE HCL 25 MG PO CAPS
25.0000 mg | ORAL_CAPSULE | ORAL | Status: AC
  Administered 2013-07-09: 25 mg via ORAL
  Filled 2013-07-09: qty 1

## 2013-07-09 MED ORDER — DEXTROSE-NACL 5-0.45 % IV SOLN
INTRAVENOUS | Status: DC
  Administered 2013-07-09: 09:00:00 via INTRAVENOUS

## 2013-07-09 MED ORDER — DIAZEPAM 5 MG PO TABS
10.0000 mg | ORAL_TABLET | ORAL | Status: AC
  Administered 2013-07-09: 10 mg via ORAL
  Filled 2013-07-09: qty 2

## 2013-07-09 MED ORDER — CIPROFLOXACIN HCL 500 MG PO TABS
500.0000 mg | ORAL_TABLET | ORAL | Status: AC
  Administered 2013-07-09: 500 mg via ORAL
  Filled 2013-07-09: qty 1

## 2013-07-09 NOTE — Discharge Instructions (Signed)
Lithotripsy Care After Refer to this sheet for the next few weeks. These discharge instructions provide you with general information on caring for yourself after you leave the hospital. Your caregiver may also give you specific instructions. Your treatment has been planned according to the most current medical practices available, but unavoidable complications sometimes occur. If you have any problems or questions after discharge, please call your caregiver. AFTER THE PROCEDURE   The recovery time will vary with the procedure done.  You will be taken to the recovery area. A nurse will watch and check your progress. Once you are awake, stable, and taking fluids well, you will be allowed to go home as long as there are no problems.  Your urine may have a red tinge for a few days after treatment. Blood loss is usually minimal.  You may have soreness in the back or flank area. This usually goes away after a few days. The procedure can cause blotches or bruises on the back where the pressure wave enters the skin. These marks usually cause only minimal discomfort and should disappear in a short time.  Stone fragments should begin to pass within 24 hours of treatment. However, a delayed passage is not unusual.  You may have pain, discomfort, and feel sick to your stomach (nauseous) when the crushed (pulverized) fragments of stone are passed down the tube from the kidney to the bladder. Stone fragments can pass soon after the procedure and may last for up to 4 to 8 weeks.  A small number of patients may have severe pain when stone fragments are not able to pass, which leads to an obstruction.  If your stone is greater than 1 inch/2.5 centimeters in diameter or if you have multiple stones that have a combined diameter greater than 1 inch/2.5 centimeters, you may require more than 1 treatment.  You must have someone drive you home. HOME CARE INSTRUCTIONS   Rest at home until you feel your energy  improving.  Only take over-the-counter or prescription medicines for pain, discomfort, or fever as directed by your caregiver. Depending on the type of lithotripsy, you may need to take medicines (antibiotics) that kill germs and anti-inflammatory medicines for a few days.  Drink enough water and fluids to keep your urine clear or pale yellow. This helps "flush" your kidneys. It helps pass any remaining pieces of stone and prevents stones from coming back.  Most people can resume daily activities within 1 or 2 days after standard lithotripsy. It can take longer to recover from laser and percutaneous lithotripsy.  If the stones are in your urinary system, you may be asked to strain your urine at home to look for stones. Any stones that are found can be sent to a medical lab for examination.  Visit your caregiver for a follow-up appointment in a few weeks. Your doctor may remove your stent if you have one. Your caregiver will also check to see whether stone particles still remain. SEEK MEDICAL CARE IF:   Your pain is not relieved by medicine.  You have a lasting nauseous feeling.  You feel there is too much blood in the urine.  You develop persistent problems with frequent and/or painful urination that does not at least partially improve after 2 days following the procedure.  You have a congested cough.  You feel lightheaded.  You develop a rash or any other signs that might suggest an allergic problem.  You develop any reaction or side effects to your medicine(s).  SEEK IMMEDIATE MEDICAL CARE IF:   You experience severe back and/or flank pain.  You see nothing but blood when you urinate.  You cannot pass any urine at all.  You have a fever.  You develop shortness of breath, difficulty breathing, or chest pain.  You develop vomiting that will not stop after 6 to 8 hours.  You have a fainting episode. MAKE SURE YOU:   Understand these instructions.  Will watch your  condition.  Will get help right away if you are not doing well or get worse. Document Released: 07/08/2007 Document Revised: 09/10/2011 Document Reviewed: 01/01/2013 [facility] Patient Information 2014 [place], [place].

## 2013-08-26 ENCOUNTER — Other Ambulatory Visit: Payer: Self-pay | Admitting: Urology

## 2013-08-28 ENCOUNTER — Other Ambulatory Visit: Payer: Self-pay | Admitting: Urology

## 2013-08-28 ENCOUNTER — Ambulatory Visit (HOSPITAL_COMMUNITY)
Admission: RE | Admit: 2013-08-28 | Discharge: 2013-08-28 | Disposition: A | Discharge status: Home / Self Care | Payer: PRIVATE HEALTH INSURANCE | Source: Ambulatory Visit | Attending: Adult Health | Admitting: Adult Health

## 2013-08-28 ENCOUNTER — Ambulatory Visit (INDEPENDENT_AMBULATORY_CARE_PROVIDER_SITE_OTHER): Payer: Self-pay | Admitting: Urology

## 2013-08-28 ENCOUNTER — Other Ambulatory Visit (HOSPITAL_COMMUNITY): Payer: Self-pay | Admitting: Adult Health

## 2013-08-28 DIAGNOSIS — N200 Calculus of kidney: Secondary | ICD-10-CM

## 2013-08-28 DIAGNOSIS — N210 Calculus in bladder: Secondary | ICD-10-CM

## 2013-08-28 DIAGNOSIS — N201 Calculus of ureter: Secondary | ICD-10-CM

## 2013-08-31 ENCOUNTER — Ambulatory Visit (INDEPENDENT_AMBULATORY_CARE_PROVIDER_SITE_OTHER): Payer: PRIVATE HEALTH INSURANCE | Admitting: Cardiology

## 2013-08-31 ENCOUNTER — Encounter: Payer: Self-pay | Admitting: Cardiology

## 2013-08-31 ENCOUNTER — Encounter (INDEPENDENT_AMBULATORY_CARE_PROVIDER_SITE_OTHER): Payer: Self-pay

## 2013-08-31 VITALS — BP 156/89 | HR 73 | Ht 71.0 in | Wt 175.0 lb

## 2013-08-31 DIAGNOSIS — I714 Abdominal aortic aneurysm, without rupture, unspecified: Secondary | ICD-10-CM

## 2013-08-31 DIAGNOSIS — E782 Mixed hyperlipidemia: Secondary | ICD-10-CM

## 2013-08-31 DIAGNOSIS — I10 Essential (primary) hypertension: Secondary | ICD-10-CM

## 2013-08-31 DIAGNOSIS — I2510 Atherosclerotic heart disease of native coronary artery without angina pectoris: Secondary | ICD-10-CM

## 2013-08-31 MED ORDER — HYDROCHLOROTHIAZIDE 25 MG PO TABS
25.0000 mg | ORAL_TABLET | Freq: Every day | ORAL | Status: DC
Start: 2013-08-31 — End: 2014-02-10

## 2013-08-31 NOTE — Progress Notes (Signed)
Clinical Summary [name] is a 63 y.o.male seen today for follow up of the following medical problems.   1. CAD  - prior 3 vessel CABG in 09/2012 (LIMA to LAD, SVG to ramus, SVG to OM), history of PCI prior to that. 09/2012 LVEF 60-65% by echo   - no recent chest pain, occsasional msk pain at the incision site. No SOB, no DOE. Walks regularly for 25 minutes or so without limitations. No orthopnea, no PND  Compliant with meds: ASA, atorva, metoprolol, ramipril   2. HTN  - checks bp at home daily. Typically 160s/90s.  - compliant with meds  3. HL  - compliant with statin  - 09/2012: TC 77 TG 113 HDL 30 LDL 24   4. AAA  - stable from most recent ultrasound  - denies any abdominal symptoms   Past Medical History  Diagnosis Date  . ASCVD (arteriosclerotic cardiovascular disease)     LAD stent in 1999; RCA bare-metal stent in 12/2007, LAD stent patent, 50-60% proximal Cx lesion treated medically; 09/2012: CABG x3, LIMA to LAD, Sequential SVG to ramus intermediate and OM branch with EVH via right thigh  . AAA (abdominal aortic aneurysm)     3.3 cm in 2012  . Hyperlipidemia   . Diabetes mellitus   . Thrombocytopenia     Chronic; evaluated by Dr. Tressie Stalker in 2007  . Tobacco abuse   . Hepatic steatosis   . ED (erectile dysfunction)   . Nephrolithiasis   . Anxiety   . Depression   . Insomnia   . Gastric ulcer   . Coronary artery disease   . MI (myocardial infarction)   . THROMBOCYTOPENIA 08/09/2010    Platelets 88-114,000 over the past 4 years; prior evaluation by Dr. Tressie Stalker without a specific etiology identified.  01/2012: Normal CBC ex platelets-120   . ABDOMINAL AORTIC ANEURYSM 11/07/2009    Diameter of 3.3 cm by CT in 12/2010   . NEPHROLITHIASIS, RECURRENT 08/09/2010    Obstruction of the ureter on CT scan of the abdomen resulting in right hydronephrosis in 12/2010; bladder outlet obstruction prior to that.  CT also revealed atherosclerotic changes in the abdominal arterial  vessels, hepatic granuloma and calcification of the prostate.   Marland Kitchen HYPERLIPIDEMIA 08/09/2010    Lipid profile in 07/2010:144, 124, 34, 86; 01/2012:93, 82, 34, 43   . DIABETES MELLITUS, TYPE II 08/09/2010    no insulin    . S/P CABG x 3 10/07/2012    LIMA to LAD, Sequential SVG to ramus intermediate and OM branch with EVH via right thigh     Allergies  Allergen Reactions  . Meperidine Hcl Other (See Comments)    Was mixed with phenergan and turned blood vessels red.   . Promethazine Hcl Other (See Comments)    Was mixed with the Demerol and caused veins to turn red.      Current Outpatient Prescriptions  Medication Sig Dispense Refill  . ALPRAZolam (XANAX) 0.5 MG tablet Take 0.5 mg by mouth at bedtime as needed for anxiety. Sleep      . aspirin EC 81 MG tablet Take 81 mg by mouth daily.      Marland Kitchen atorvastatin (LIPITOR) 20 MG tablet Take 20 mg by mouth daily at 6 PM.       . fish oil-omega-3 fatty acids 1000 MG capsule Take 1 capsule by mouth 2 (two) times daily.       . Linagliptin-Metformin HCl (JENTADUETO) 2.5-500 MG TABS Take  2 tablets by mouth 2 (two) times daily.       . metoprolol tartrate (LOPRESSOR) 25 MG tablet Take 25 mg by mouth 2 (two) times daily.      Marland Kitchen oxyCODONE (OXY IR/ROXICODONE) 5 MG immediate release tablet Take 5-10 mg by mouth every 4 (four) hours as needed for moderate pain or severe pain.      . pantoprazole (PROTONIX) 20 MG tablet Take 20 mg by mouth daily.       . ramipril (ALTACE) 10 MG capsule Take 20 mg by mouth daily with breakfast.      . tadalafil (CIALIS) 10 MG tablet Take 10 mg by mouth daily as needed for erectile dysfunction.        No current facility-administered medications for this visit.     Past Surgical History  Procedure Laterality Date  . Lithotripsy    . Inguinal hernia repair    . Coronary angioplasty with stent placement      1999, 2009  . Esophagogastroduodenoscopy  04/04/2012    Procedure: ESOPHAGOGASTRODUODENOSCOPY (EGD);  Surgeon: Rogene Houston, MD;  [facility];  Service: Endoscopy;  Laterality: N/A;  830  . Esophagogastroduodenoscopy  07/25/2012    Procedure: ESOPHAGOGASTRODUODENOSCOPY (EGD);  Surgeon: Rogene Houston, MD;  Location: [facility];  Service: Endoscopy;  Laterality: N/A;  830  . Coronary artery bypass graft N/A 10/07/2012    Procedure: CORONARY ARTERY BYPASS GRAFTING (CABG);  Surgeon: Rexene Alberts, MD;  Location: [facility];  Service: Open Heart Surgery;  Laterality: N/A;  . Intraoperative transesophageal echocardiogram N/A 10/07/2012    Procedure: INTRAOPERATIVE TRANSESOPHAGEAL ECHOCARDIOGRAM;  Surgeon: Rexene Alberts, MD;  Location: [place];  Service: Open Heart Surgery;  Laterality: N/A;     Allergies  Allergen Reactions  . Meperidine Hcl Other (See Comments)    Was mixed with phenergan and turned blood vessels red.   . Promethazine Hcl Other (See Comments)    Was mixed with the Demerol and caused veins to turn red.       Family History  Problem Relation Age of Onset  . Ovarian cancer Sister      Social History [name] reports that he has been smoking Cigarettes.  He has a 20 pack-year smoking history. He does not have any smokeless tobacco history on file. [name] reports that he does not drink alcohol.   Review of Systems CONSTITUTIONAL: No weight loss, fever, chills, weakness or fatigue.  HEENT: Eyes: No visual loss, blurred vision, double vision or yellow sclerae.No hearing loss, sneezing, congestion, runny nose or sore throat.  SKIN: No rash or itching.  CARDIOVASCULAR: per HPI RESPIRATORY: No shortness of breath, cough or sputum.  GASTROINTESTINAL: No anorexia, nausea, vomiting or diarrhea. No abdominal pain or blood.  GENITOURINARY: No burning on urination, no polyuria NEUROLOGICAL: No headache, dizziness, syncope, paralysis, ataxia, numbness or tingling in the extremities. No change in bowel or bladder control.  MUSCULOSKELETAL: No muscle, back pain, joint pain or  stiffness.  LYMPHATICS: No enlarged nodes. No history of splenectomy.  PSYCHIATRIC: No history of depression or anxiety.  ENDOCRINOLOGIC: No reports of sweating, cold or heat intolerance. No polyuria or polydipsia.  Marland Kitchen   Physical Examination p 73 bp 156/89 Wt 175 lbs BMI 24 Gen: resting comfortably, no acute distress HEENT: no scleral icterus, pupils equal round and reactive, no palptable cervical adenopathy,  CV: RRR, no m/r/g, no JVD, no carotid bruits Resp: Clear to auscultation bilaterally GI: abdomen is soft,  non-tender, non-distended, normal bowel sounds, no hepatosplenomegaly MSK: extremities are warm, no edema.  Skin: warm, no rash Neuro:  no focal deficits Psych: appropriate affect   Diagnostic Studies 09/2012 Echo: LVEF 55-60%, no WMAs,   09/2012 AAA [place]: 3.3 x 3.9 cm, stable from prior CT     Assessment and Plan  1. CAD  - no current symptoms  - continue risk factor modification and secondary prevention    2. HTN  - not at goal, goal bp given DM <130/80 - start HCTZ 25mg  daily, will check BMET in 3 weeks.  - bring bp log in 3 weeks.  3. HL  - repeat fasting lipid panel, continue current statin.   4. AAA  - repeat US    Follow up 6 months   Arnoldo Lenis, M.D., F.A.C.C.

## 2013-08-31 NOTE — Patient Instructions (Signed)
Your physician wants you to follow-up in: 6 months  You will receive a reminder letter in the mail two months in advance. If you don't receive a letter, please call our office to schedule the follow-up appointment.  Your physician has recommended you make the following change in your medication:   1) START TAKING HYDROCHLOROTHIAZIDE 25MG  ONCE DAILY  Your physician recommends that you return for lab work in: [place], [place] TO [place]  Your physician has requested that you have an abdominal aorta duplex. During this test, an ultrasound is used to evaluate the aorta. Allow 30 minutes for this exam. Do not eat after midnight the day before and avoid carbonated beverages  WE WILL CALL YOU WITH YOUR TEST RESULTS/INSTRUCTIONS/NEXT STEPS ONCE RECEIVED BY THE PROVIDER

## 2013-09-03 ENCOUNTER — Ambulatory Visit (HOSPITAL_COMMUNITY)
Admission: RE | Admit: 2013-09-03 | Discharge: 2013-09-03 | Disposition: A | Discharge status: Home / Self Care | Payer: PRIVATE HEALTH INSURANCE | Source: Ambulatory Visit | Attending: Cardiology | Admitting: Cardiology

## 2013-09-03 DIAGNOSIS — I714 Abdominal aortic aneurysm, without rupture, unspecified: Secondary | ICD-10-CM

## 2013-09-04 ENCOUNTER — Encounter: Payer: Self-pay | Admitting: *Deleted

## 2013-09-08 ENCOUNTER — Telehealth: Payer: Self-pay | Admitting: *Deleted

## 2013-09-08 MED ORDER — METOPROLOL TARTRATE 25 MG PO TABS: 25.0000 mg | ORAL_TABLET | Freq: Two times a day (BID) | ORAL | Status: DC

## 2013-09-08 NOTE — Telephone Encounter (Signed)
Metoprolol refill completed

## 2013-09-08 NOTE — Telephone Encounter (Signed)
Pt needs refill on metoprolol to [facility] in [place]

## 2013-10-05 ENCOUNTER — Ambulatory Visit (HOSPITAL_COMMUNITY)
Admission: RE | Admit: 2013-10-05 | Discharge: 2013-10-05 | Disposition: A | Discharge status: Home / Self Care | Payer: PRIVATE HEALTH INSURANCE | Source: Ambulatory Visit | Attending: Urology | Admitting: Urology

## 2013-10-05 ENCOUNTER — Other Ambulatory Visit: Payer: Self-pay | Admitting: Urology

## 2013-10-05 DIAGNOSIS — N210 Calculus in bladder: Secondary | ICD-10-CM

## 2013-10-05 DIAGNOSIS — N200 Calculus of kidney: Secondary | ICD-10-CM | POA: Insufficient documentation

## 2013-10-09 ENCOUNTER — Ambulatory Visit (INDEPENDENT_AMBULATORY_CARE_PROVIDER_SITE_OTHER): Payer: PRIVATE HEALTH INSURANCE | Admitting: Urology

## 2013-10-09 DIAGNOSIS — N210 Calculus in bladder: Secondary | ICD-10-CM

## 2013-10-09 DIAGNOSIS — N200 Calculus of kidney: Secondary | ICD-10-CM

## 2013-12-01 ENCOUNTER — Telehealth: Payer: Self-pay | Admitting: Cardiology

## 2013-12-01 MED ORDER — RAMIPRIL 10 MG PO CAPS: 20.0000 mg | ORAL_CAPSULE | Freq: Every day | ORAL | Status: DC

## 2013-12-01 NOTE — Telephone Encounter (Signed)
Refill request complete 

## 2013-12-01 NOTE — Telephone Encounter (Signed)
Received fax refill request  Rx # [id number] Medication:  Ramipirl 10 mg capsule  Qty 60  Sig:  Take 2 capsules (20 mg total) by mouth daily Physician:  Harl Bowie

## 2013-12-02 ENCOUNTER — Telehealth: Payer: Self-pay | Admitting: *Deleted

## 2013-12-02 NOTE — Telephone Encounter (Signed)
Phoned [facility] in [place] refilled meds. Spoke with patient to let him know meds were ready for pickup

## 2013-12-02 NOTE — Telephone Encounter (Signed)
rampril and hydrachlorothiazide 25mg  need called in to [facility]

## 2013-12-15 ENCOUNTER — Other Ambulatory Visit (INDEPENDENT_AMBULATORY_CARE_PROVIDER_SITE_OTHER): Payer: Self-pay | Admitting: Internal Medicine

## 2013-12-15 DIAGNOSIS — K219 Gastro-esophageal reflux disease without esophagitis: Secondary | ICD-10-CM

## 2014-01-08 IMAGING — CR DG ABDOMEN 1V
2 series · 2 of 2 positions shown · non-contrast
Comparison: 07/03/2013

CLINICAL DATA: Pre lithotripsy.

EXAM:
ABDOMEN - 1 VIEW

[t abdomen supine (1 of 2)]
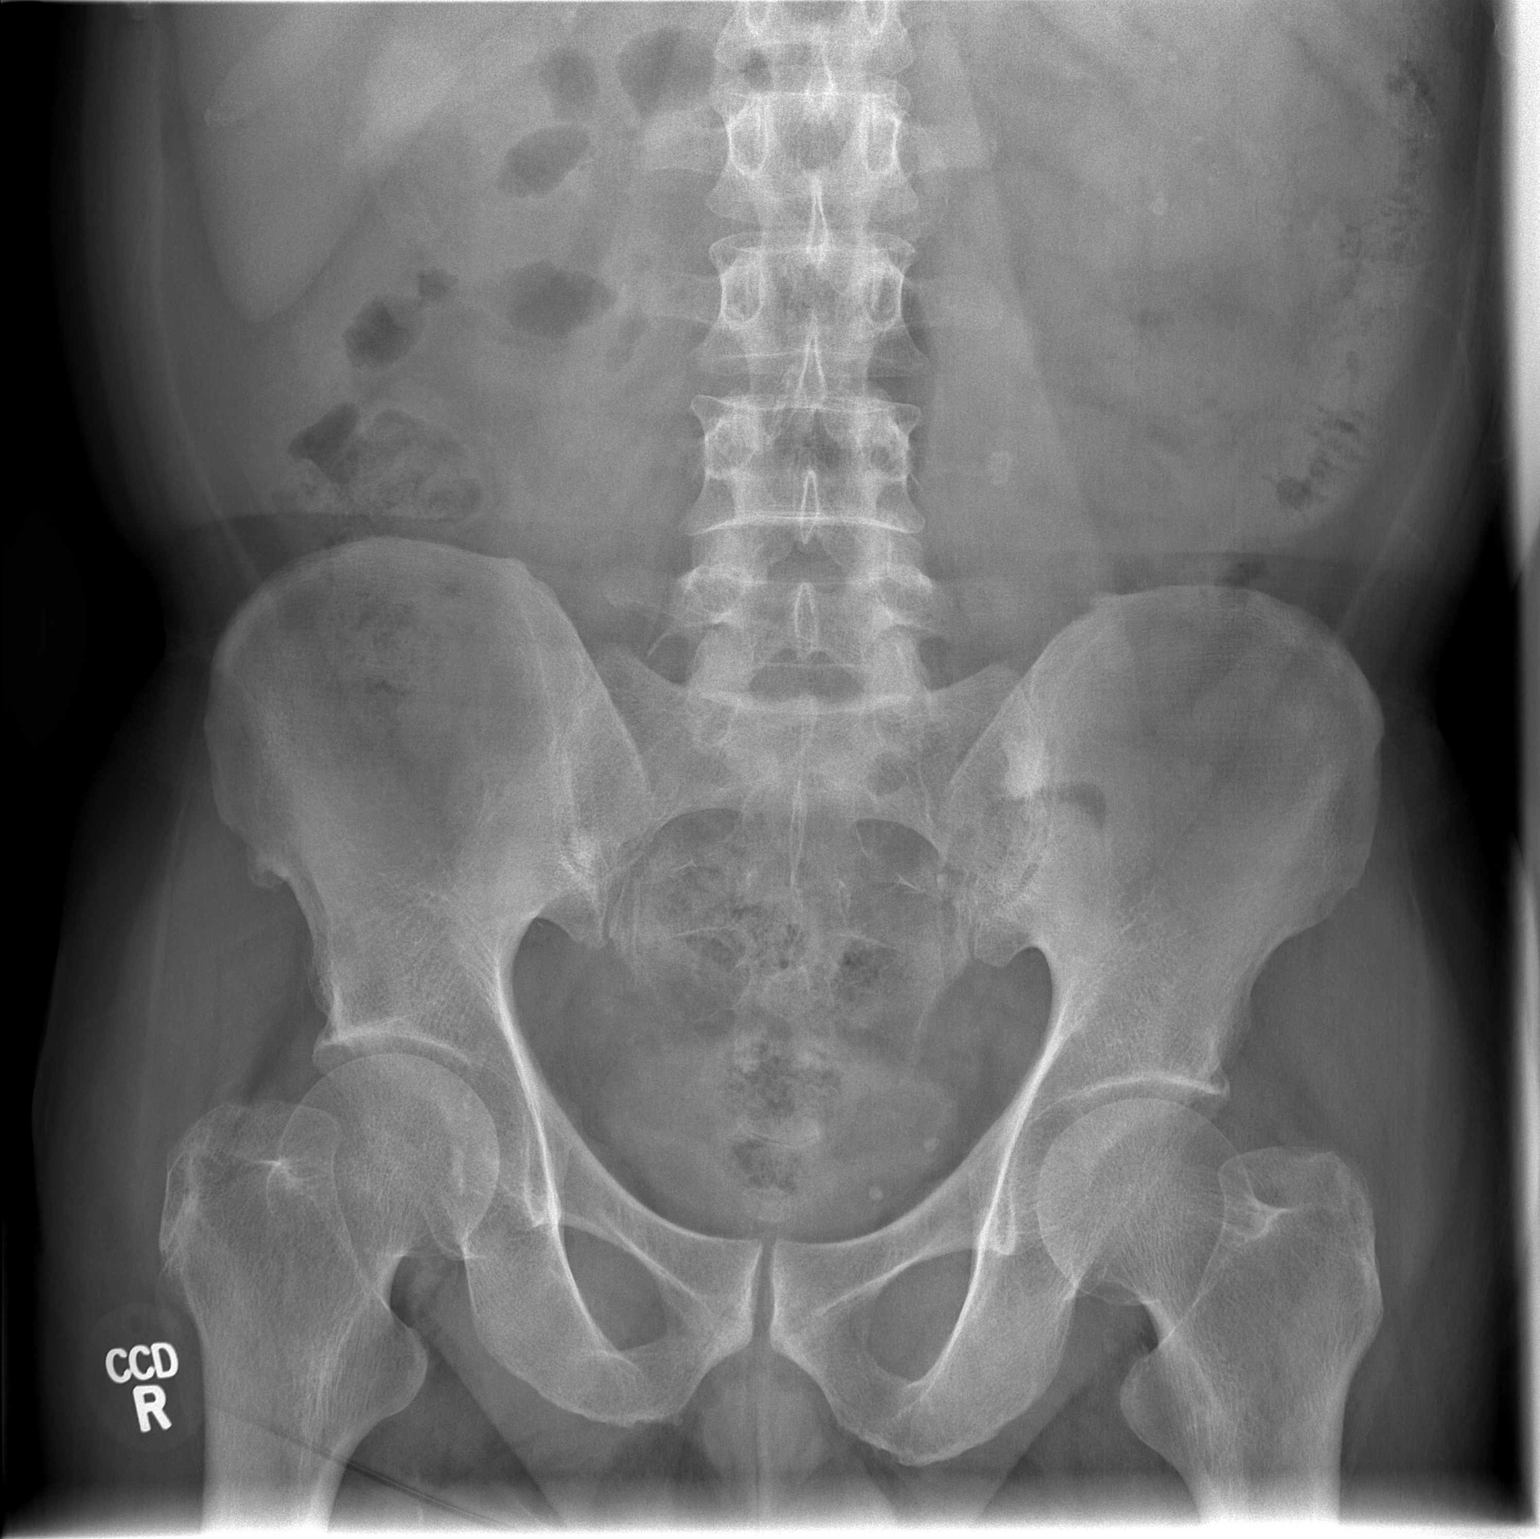

[t abdomen supine (2 of 2)]
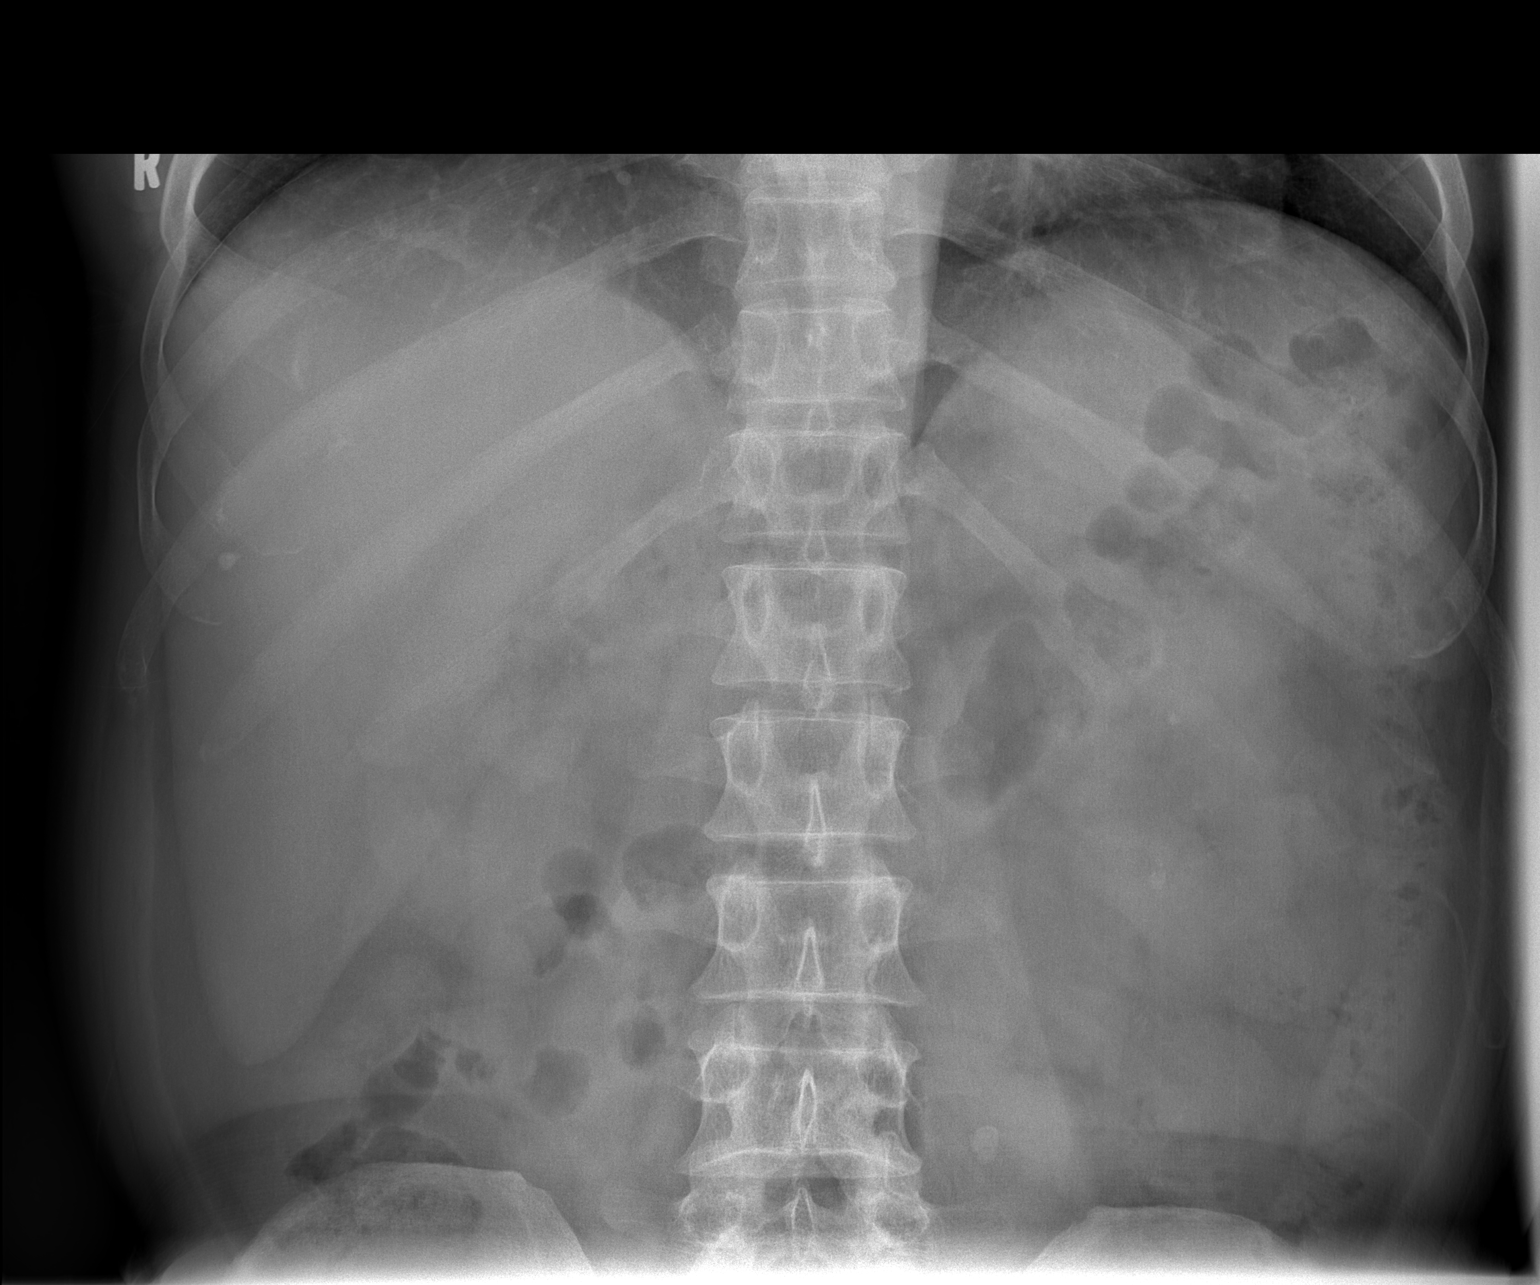

[2 of 2 positions shown; findings below may reference images not displayed]

FINDINGS: Stable small bilateral renal calculi and probable left ureteral
calculus located at the level of the L4 vertebral body. No definite
right-sided ureteral calculi or bladder calculi. Stable left-sided
phlebolith thick calcifications.
IMPRESSION: Small bilateral renal calculi.

Suspect persistent left-sided ureteral calculus.

## 2014-02-09 ENCOUNTER — Telehealth: Payer: Self-pay | Admitting: Cardiology

## 2014-02-10 MED ORDER — HYDROCHLOROTHIAZIDE 25 MG PO TABS: 25.0000 mg | ORAL_TABLET | Freq: Every day | ORAL | Status: DC

## 2014-02-10 NOTE — Telephone Encounter (Signed)
Medication sent to pharmacy  

## 2014-02-27 IMAGING — CR DG ABDOMEN 1V
1 series · 1 of 1 positions shown · non-contrast
Comparison: 07/09/2013

CLINICAL DATA: Ureteral calculus. Lithotripsy of left-sided stone
approximately 1 month ago.

EXAM:
ABDOMEN - 1 VIEW

[view not recorded]
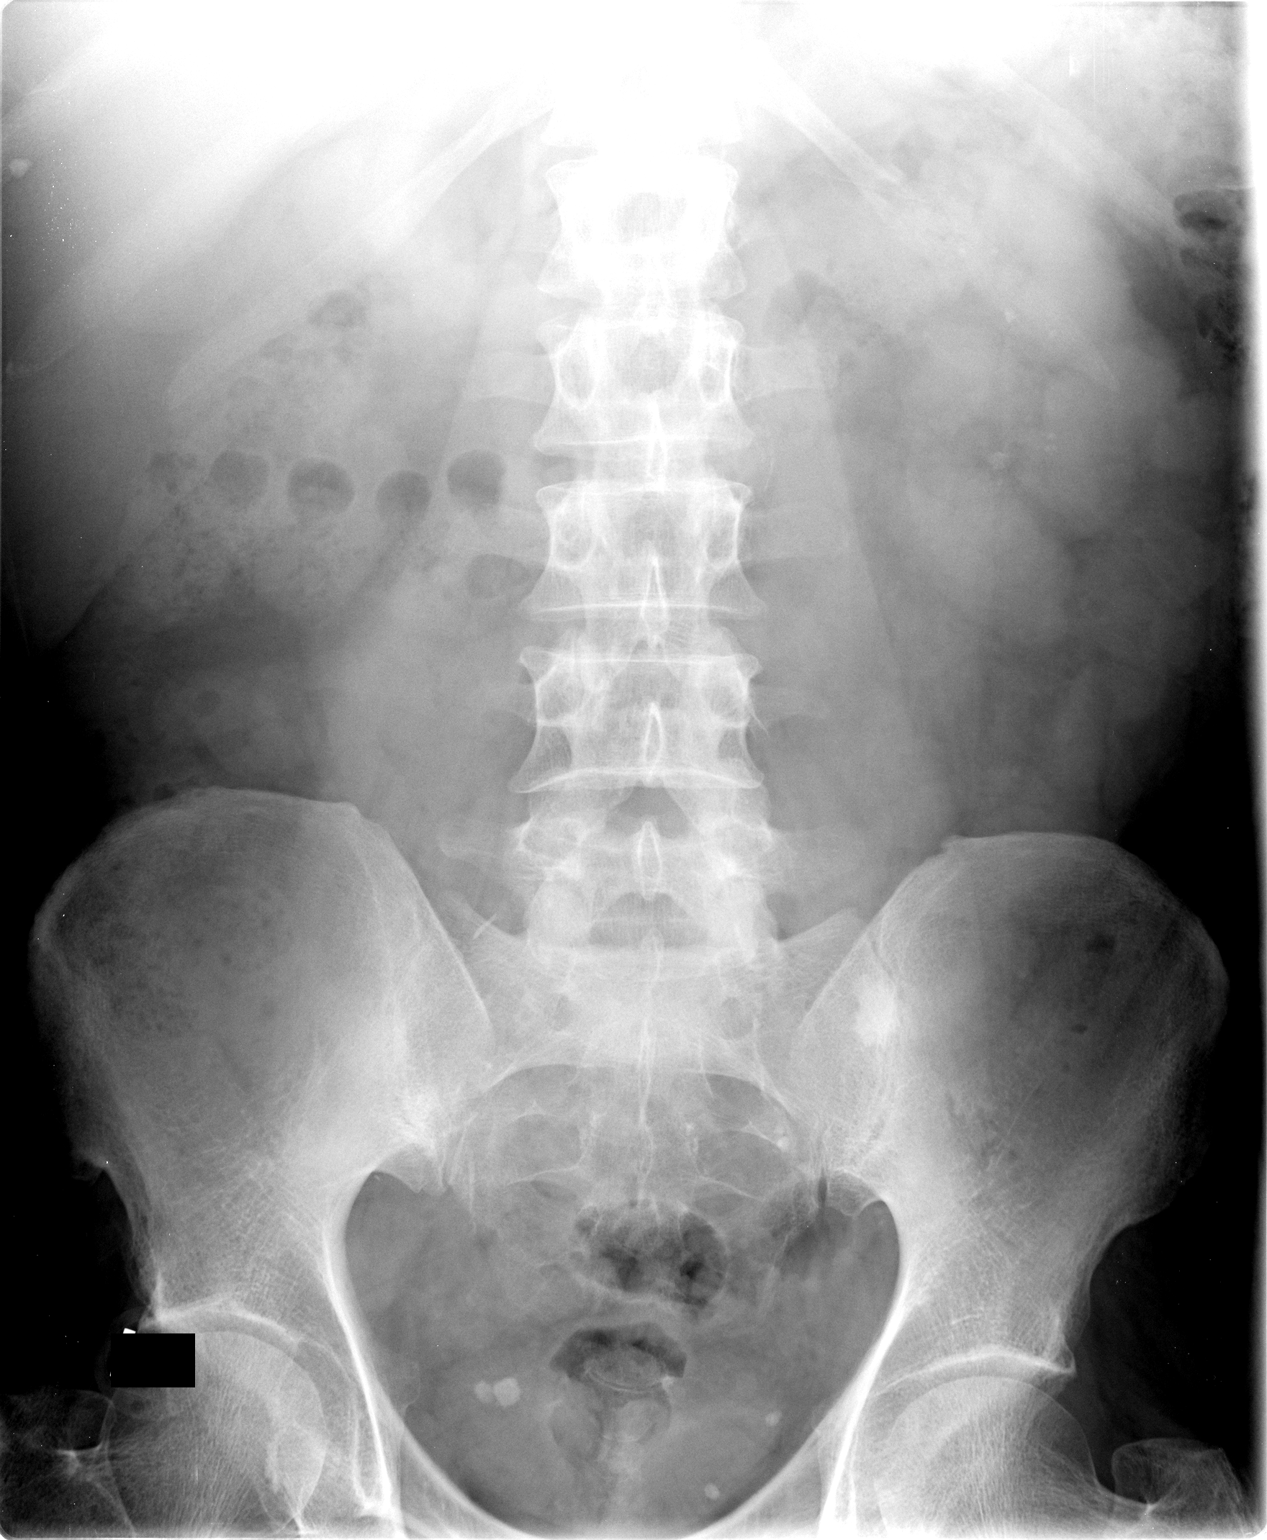

[1 of 1 positions shown; findings below may reference images not displayed]

FINDINGS: Multiple small left renal calculi are again seen, the largest
measuring approximately 6 mm in the lower pole. Overall, left renal
calculi appear stable to possibly minimally increased in number.
Previously described right renal calculi are not well visualized on
the current study. Left ureteral calculus described on the prior
study is no longer seen along the course of the left ureter. There
are 2 new calculi measuring 5-6 mm which project over the right
aspect of the bladder in the pelvis. Two left-sided pelvic
calcifications are unchanged and likely represent phleboliths. Liver
calcification is unchanged. There is no bowel dilatation. No acute
osseous abnormality is identified.
IMPRESSION: 1. Left ureteral calculus described on the prior radiographs is no
longer seen along the course of the left ureter and may now be in
the bladder. There are 2 new stones in the right aspect of the
bladder.
2. Multiple small left renal calculi, stable to minimally increased
from prior. Right renal calculi not well seen.

## 2014-03-09 ENCOUNTER — Ambulatory Visit (INDEPENDENT_AMBULATORY_CARE_PROVIDER_SITE_OTHER): Payer: PRIVATE HEALTH INSURANCE | Admitting: Cardiology

## 2014-03-09 ENCOUNTER — Encounter: Payer: Self-pay | Admitting: Cardiology

## 2014-03-09 VITALS — BP 150/92 | HR 83 | Ht 71.0 in

## 2014-03-09 DIAGNOSIS — I10 Essential (primary) hypertension: Secondary | ICD-10-CM

## 2014-03-09 DIAGNOSIS — I2510 Atherosclerotic heart disease of native coronary artery without angina pectoris: Secondary | ICD-10-CM

## 2014-03-09 DIAGNOSIS — I714 Abdominal aortic aneurysm, without rupture, unspecified: Secondary | ICD-10-CM

## 2014-03-09 DIAGNOSIS — E782 Mixed hyperlipidemia: Secondary | ICD-10-CM

## 2014-03-09 MED ORDER — RAMIPRIL 10 MG PO CAPS: 20.0000 mg | ORAL_CAPSULE | Freq: Every day | ORAL | Status: DC

## 2014-03-09 MED ORDER — HYDROCHLOROTHIAZIDE 25 MG PO TABS: 25.0000 mg | ORAL_TABLET | Freq: Every day | ORAL | Status: DC

## 2014-03-09 MED ORDER — METOPROLOL TARTRATE 25 MG PO TABS
25.0000 mg | ORAL_TABLET | Freq: Two times a day (BID) | ORAL | Status: DC
Start: 2014-03-09 — End: 2015-03-11

## 2014-03-09 NOTE — Progress Notes (Signed)
Clinical Summary [name] is a 63 y.o.male seen today for follow up of the following medical problems.   1. CAD  - prior 3 vessel CABG in 09/2012 (LIMA to LAD, SVG to ramus, SVG to OM), history of PCI prior to that. 09/2012 LVEF 60-65% by echo  - no recent chest pain. No SOB, no DOE. Walks regularly for 25 minutes or so without limitations. No orthopnea, no PND  Compliant with meds: ASA, atorva, metoprolol, ramipril   2. HTN  - checks bp at home at times. Typically 120s/70s.  - compliant with meds   3. HL  - compliant with statin  - 09/2012: TC 77 TG 113 HDL 30 LDL 24   4. AAA  - [place] 09/2013 3.5 x 4.5, slightly larger than prior study a year prior (3.3x3.9) - denies any symptoms   Past Medical History  Diagnosis Date  . ASCVD (arteriosclerotic cardiovascular disease)     LAD stent in 1999; RCA bare-metal stent in 12/2007, LAD stent patent, 50-60% proximal Cx lesion treated medically; 09/2012: CABG x3, LIMA to LAD, Sequential SVG to ramus intermediate and OM [name] with EVH via right thigh  . AAA (abdominal aortic aneurysm)     3.3 cm in 2012  . Hyperlipidemia   . Diabetes mellitus   . Thrombocytopenia     Chronic; evaluated by Dr. Tressie Thompson in 2007  . Tobacco abuse   . Hepatic steatosis   . ED (erectile dysfunction)   . Nephrolithiasis   . Anxiety   . Depression   . Insomnia   . Gastric ulcer   . Coronary artery disease   . MI (myocardial infarction)   . THROMBOCYTOPENIA 08/09/2010    Platelets 88-114,000 over the past 4 years; prior evaluation by Dr. Tressie Thompson without a specific etiology identified.  01/2012: Normal CBC ex platelets-120   . ABDOMINAL AORTIC ANEURYSM 11/07/2009    Diameter of 3.3 cm by CT in 12/2010   . NEPHROLITHIASIS, RECURRENT 08/09/2010    Obstruction of the ureter on CT scan of the abdomen resulting in right hydronephrosis in 12/2010; bladder outlet obstruction prior to that.  CT also revealed atherosclerotic changes in the abdominal arterial vessels,  hepatic granuloma and calcification of the prostate.   Marland Kitchen HYPERLIPIDEMIA 08/09/2010    Lipid profile in 07/2010:144, 124, 34, 86; 01/2012:93, 82, 34, 43   . DIABETES MELLITUS, TYPE II 08/09/2010    no insulin    . S/P CABG x 3 10/07/2012    LIMA to LAD, Sequential SVG to ramus intermediate and OM [name] with EVH via right thigh     Allergies  Allergen Reactions  . Meperidine Hcl Other (See Comments)    Was mixed with phenergan and turned blood vessels red.   . Promethazine Hcl Other (See Comments)    Was mixed with the Demerol and caused veins to turn red.      Current Outpatient Prescriptions  Medication Sig Dispense Refill  . ALPRAZolam (XANAX) 0.5 MG tablet Take 0.5 mg by mouth at bedtime as needed for anxiety. Sleep      . aspirin EC 81 MG tablet Take 81 mg by mouth daily.      Marland Kitchen atorvastatin (LIPITOR) 20 MG tablet Take 20 mg by mouth daily at 6 PM.       . fish oil-omega-3 fatty acids 1000 MG capsule Take 1 capsule by mouth 2 (two) times daily.       . hydrochlorothiazide (HYDRODIURIL) 25 MG tablet Take  1 tablet (25 mg total) by mouth daily.  30 tablet  3  . Linagliptin-Metformin HCl (JENTADUETO) 2.5-500 MG TABS Take 2 tablets by mouth 2 (two) times daily.       . metoprolol tartrate (LOPRESSOR) 25 MG tablet Take 1 tablet (25 mg total) by mouth 2 (two) times daily.  60 tablet  6  . NEXIUM 24HR 20 MG capsule TAKE 1 TABLET BY MOUTH 30 MINUTES BEFORE BREAKFAST  28 capsule  11  . pantoprazole (PROTONIX) 20 MG tablet Take 20 mg by mouth daily.       . ramipril (ALTACE) 10 MG capsule Take 2 capsules (20 mg total) by mouth daily with breakfast.  60 capsule  3  . tadalafil (CIALIS) 10 MG tablet Take 10 mg by mouth daily as needed for erectile dysfunction.        No current facility-administered medications for this visit.     Past Surgical History  Procedure Laterality Date  . Lithotripsy    . Inguinal hernia repair    . Coronary angioplasty with stent placement      1999, 2009  .  Esophagogastroduodenoscopy  04/04/2012    Procedure: ESOPHAGOGASTRODUODENOSCOPY (EGD);  Surgeon: Guy Houston, MD;  [facility];  Service: Endoscopy;  Laterality: N/A;  830  . Esophagogastroduodenoscopy  07/25/2012    Procedure: ESOPHAGOGASTRODUODENOSCOPY (EGD);  Surgeon: Guy Houston, MD;  [facility];  Service: Endoscopy;  Laterality: N/A;  830  . Coronary artery bypass graft N/A 10/07/2012    Procedure: CORONARY ARTERY BYPASS GRAFTING (CABG);  Surgeon: Guy Alberts, MD;  Location: [place];  Service: Open Heart Surgery;  Laterality: N/A;  . Intraoperative transesophageal echocardiogram N/A 10/07/2012    Procedure: INTRAOPERATIVE TRANSESOPHAGEAL ECHOCARDIOGRAM;  Surgeon: Guy Alberts, MD;  Location: [place];  Service: Open Heart Surgery;  Laterality: N/A;     Allergies  Allergen Reactions  . Meperidine Hcl Other (See Comments)    Was mixed with phenergan and turned blood vessels red.   . Promethazine Hcl Other (See Comments)    Was mixed with the Demerol and caused veins to turn red.       Family History  Problem Relation Age of Onset  . Ovarian cancer Sister      Social History [name] reports that he has been smoking Cigarettes.  He has a 20 pack-year smoking history. He does not have any smokeless tobacco history on file. [name] reports that he does not drink alcohol.   Review of Systems CONSTITUTIONAL: No weight loss, fever, chills, weakness or fatigue.  HEENT: Eyes: No visual loss, blurred vision, double vision or yellow sclerae.No hearing loss, sneezing, congestion, runny nose or sore throat.  SKIN: No rash or itching.  CARDIOVASCULAR: per HPI RESPIRATORY: No shortness of breath, cough or sputum.  GASTROINTESTINAL: No anorexia, nausea, vomiting or diarrhea. No abdominal pain or blood.  GENITOURINARY: No burning on urination, no polyuria NEUROLOGICAL: No headache, dizziness, syncope, paralysis, ataxia, numbness or tingling in the  extremities. No change in bowel or bladder control.  MUSCULOSKELETAL: No muscle, back pain, joint pain or stiffness.  LYMPHATICS: No enlarged nodes. No history of splenectomy.  PSYCHIATRIC: No history of depression or anxiety.  ENDOCRINOLOGIC: No reports of sweating, cold or heat intolerance. No polyuria or polydipsia.  Marland Kitchen   Physical Examination p 83 bp 150/92  Gen: resting comfortably, no acute distress HEENT: no scleral icterus, pupils equal round and reactive, no palptable cervical adenopathy,  CV: RRR,  no m/r/g, no JVD Resp: Clear to auscultation bilaterally GI: abdomen is soft, non-tender, non-distended, normal bowel sounds, no hepatosplenomegaly MSK: extremities are warm, no edema.  Skin: warm, no rash Neuro:  no focal deficits Psych: appropriate affect   Diagnostic Studies 09/2012 Echo: LVEF 55-60%, no WMAs,   09/2012 AAA [place]: 3.3 x 3.9 cm, stable from prior CT  08/2013 AAA [place] Infrarenal abdominal aortic aneurysm measuring 3.5 x 4.5 cm.  Previous examination of 10/06/2012 demonstrated the abdominal aorta  measuring 3.3 x 3.9 cm.   Assessment and Plan  1. CAD  - no current symptoms  - continue risk factor modification and secondary prevention   2. HTN  - at goal based on home numbers, continue current meds.   3. HL  - continue current statin.   4. AAA  - slight increase in size, continue to follow   F/u 6 months    Arnoldo Lenis, M.D., F.A.C.C.

## 2014-03-09 NOTE — Patient Instructions (Signed)
Your physician wants you to follow-up in: 6 months You will receive a reminder letter in the mail two months in advance. If you don't receive a letter, please call our office to schedule the follow-up appointment.     Your physician recommends that you continue on your current medications as directed. Please refer to the Current Medication list given to you today.      Thank you for choosing [facility] !        

## 2014-04-06 IMAGING — US US RENAL
1 series · 14 of 25 positions shown · non-contrast
Comparison: US AORTA dated 09/03/2013; DG ABDOMEN 1V dated 08/28/2013;
CT ABD - PELV W/ CM dated 03/25/2012

CLINICAL DATA: Kidney stone.

EXAM:
RENAL/URINARY TRACT ULTRASOUND COMPLETE

[Series 1: us renal · 0.23mm/px · 14 of 70 slices shown]
[im 1/70]
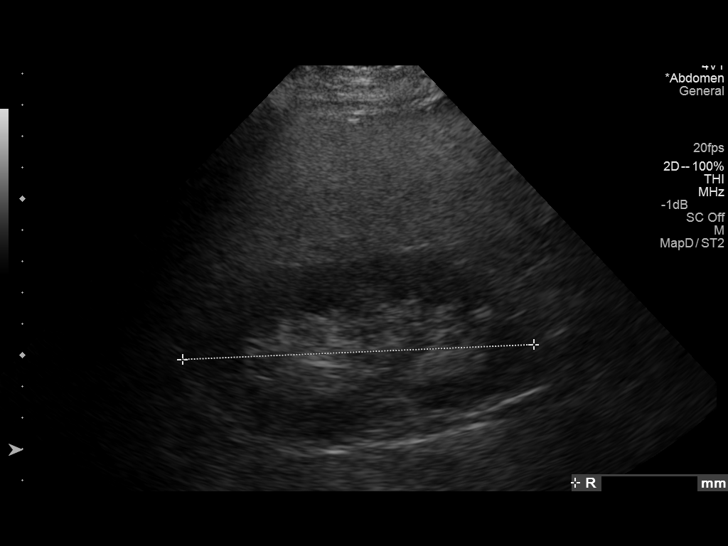
[im 6/70]
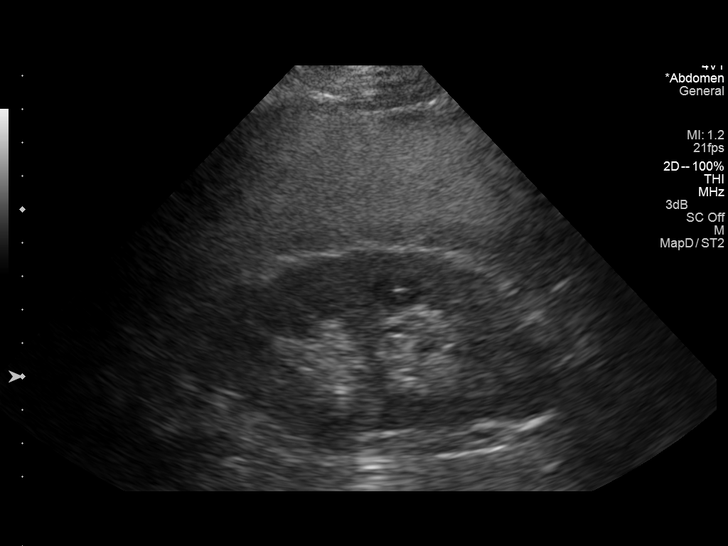
[im 12/70]
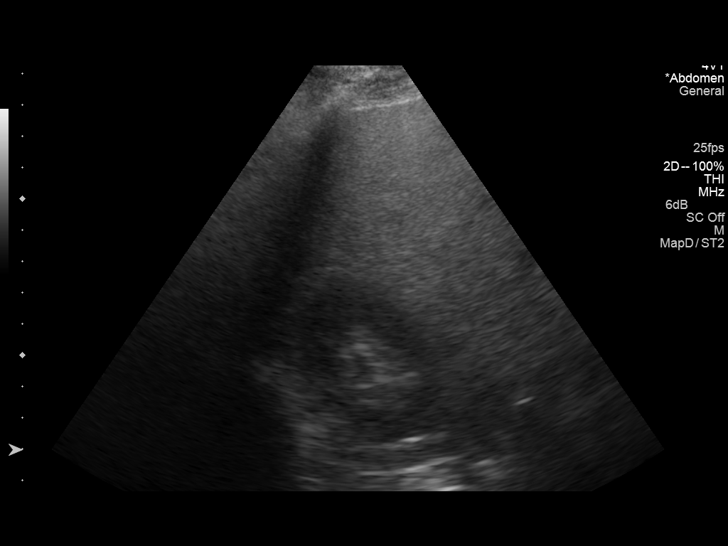
[im 18/70]
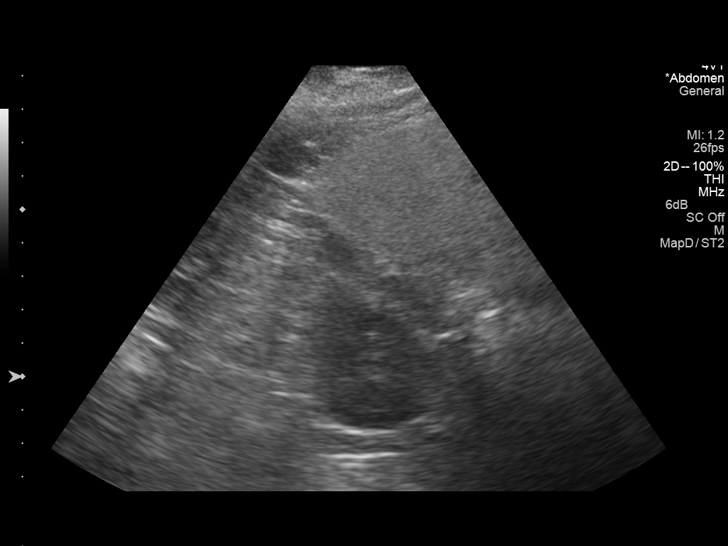
[im 24/70]
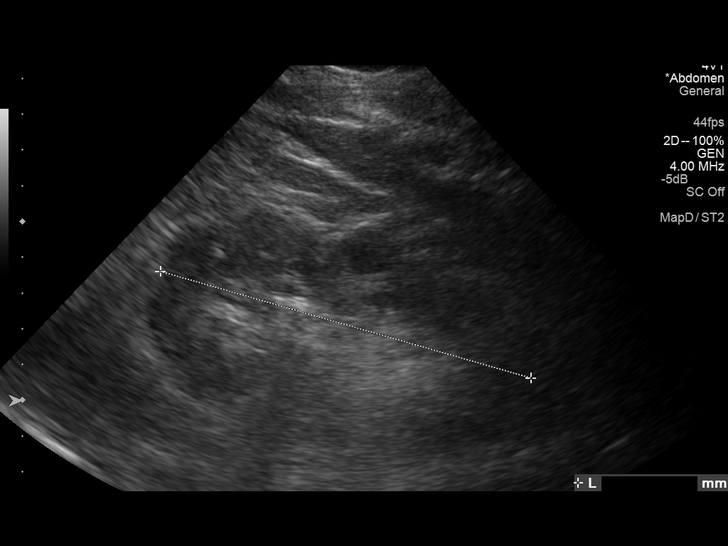
[im 26/70]
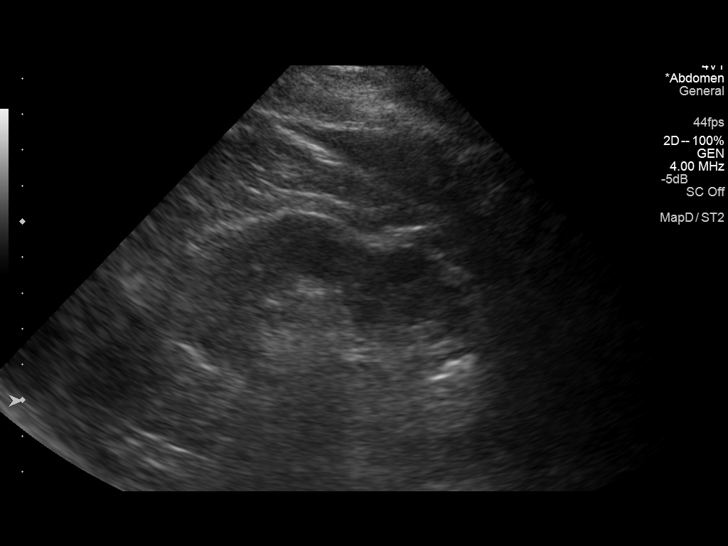
[im 32/70]
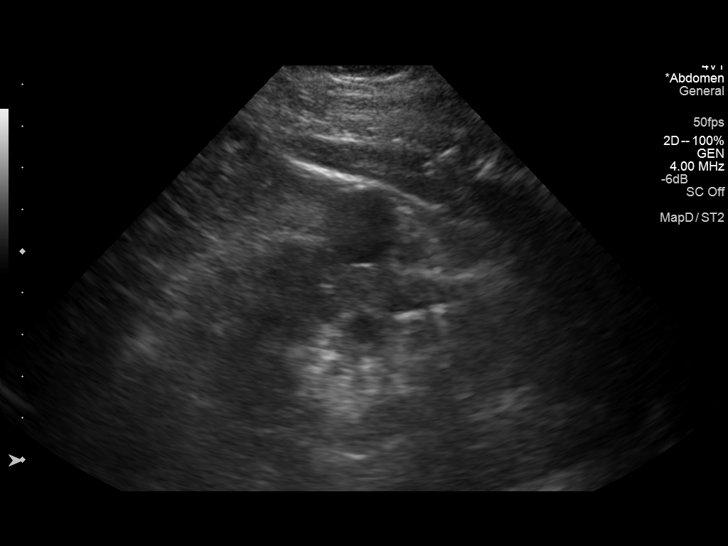
[im 38/70]
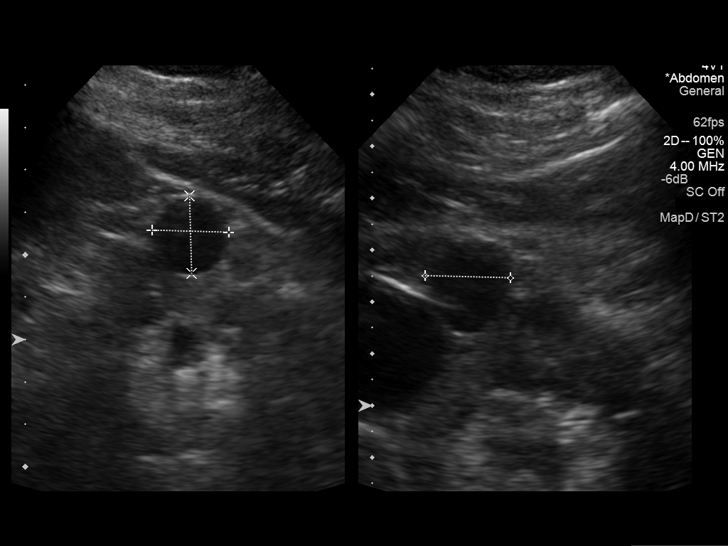
[im 44/70]
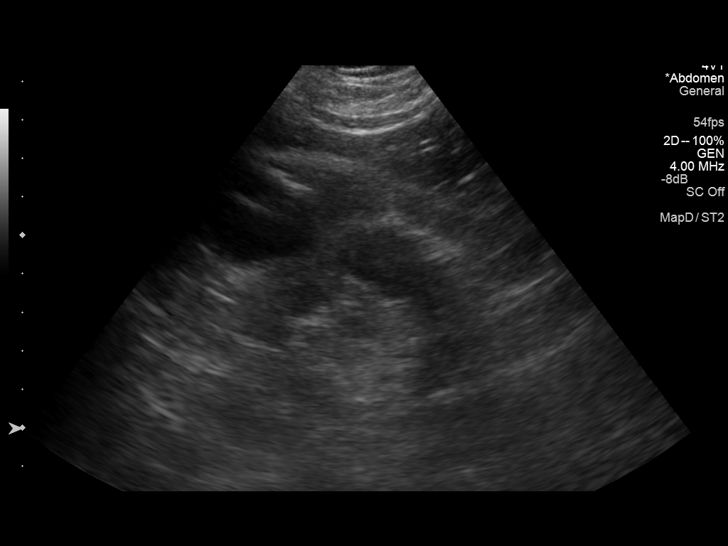
[im 47/70]
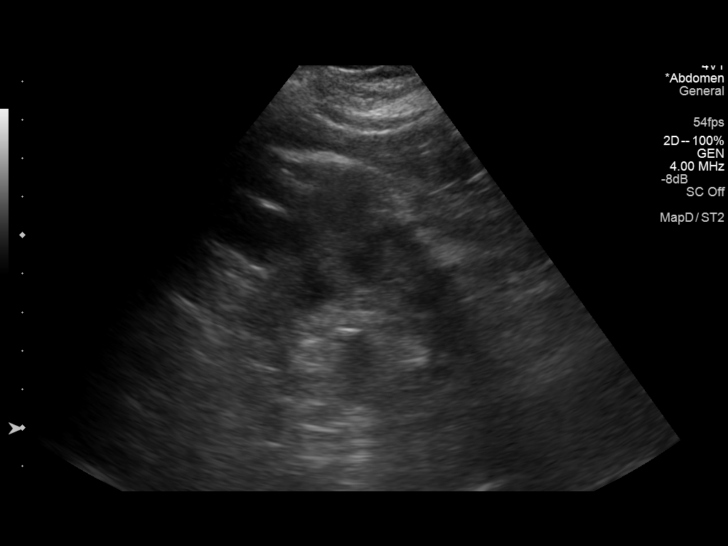
[im 52/70]
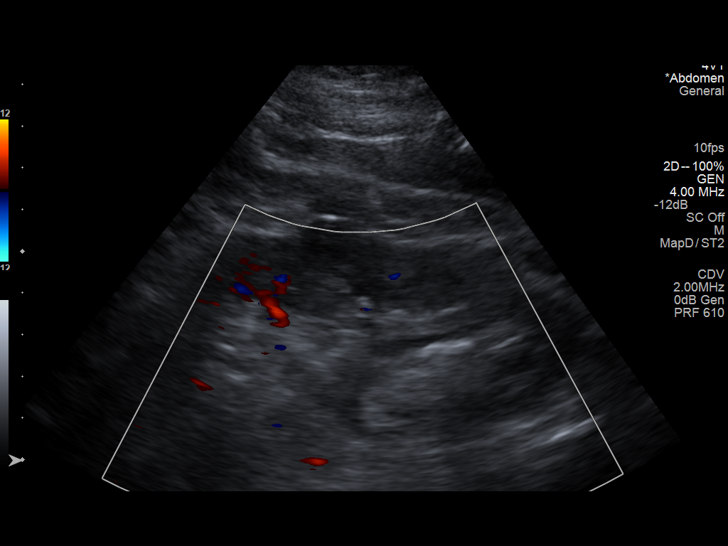
[im 58/70]
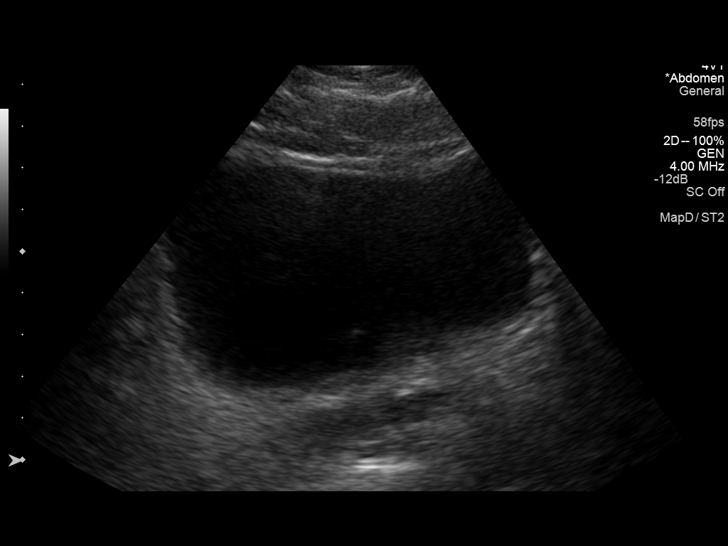
[im 64/70]
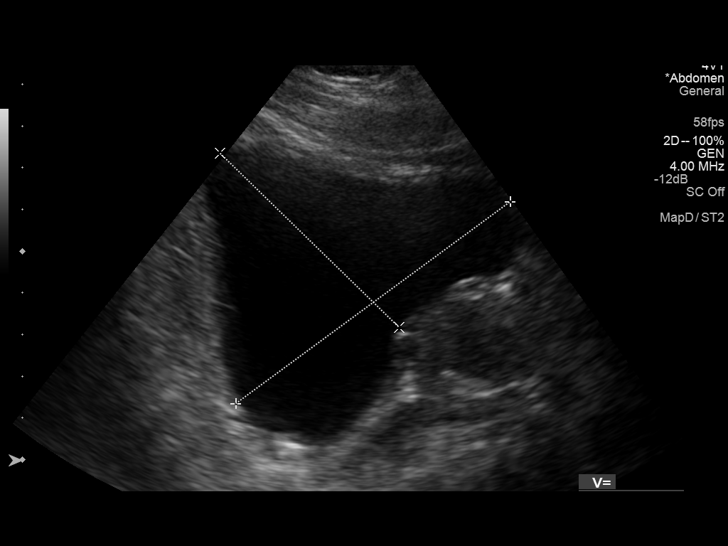
[im 70/70]
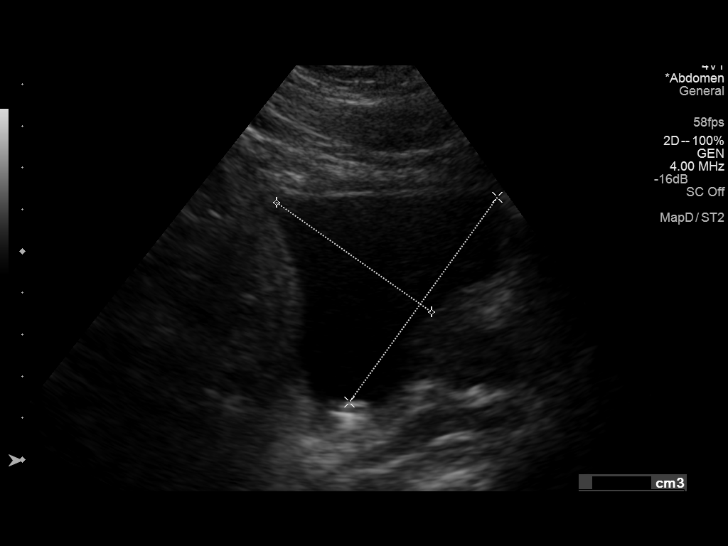

[14 of 25 positions shown; findings below may reference images not displayed]

FINDINGS: Right Kidney:

Length: 11.2 cm. Several small nonobstructing stones in the mid and
lower pole. No hydronephrosis. Normal echotexture. No focal
abnormality.

Left Kidney:

Length: 10.8 cm. Several echogenic foci, the largest in the mid to
upper pole measuring approximately 9 mm compatible with
nonobstructing stones. Slight pelvicaliectasis. Multiple cystic
areas within the midpole, the largest measuring up to 2.9 cm with
thin internal septations. These are most compatible with benign
cysts.

Bladder:

Probable bladder stone layering dependently measuring up to 16 mm.
No wall thickening. Mild postvoid residual (103 mL).
IMPRESSION: Bilateral nephrolithiasis. Mild pelvicaliectasis on the left without
overt hydronephrosis.

Benign-appearing cysts noted in the midpole of the left kidney.

16 mm bladder stone.  Mild postvoid residual.

## 2014-05-20 ENCOUNTER — Other Ambulatory Visit: Payer: Self-pay | Admitting: Urology

## 2014-05-20 ENCOUNTER — Ambulatory Visit (HOSPITAL_COMMUNITY)
Admission: RE | Admit: 2014-05-20 | Discharge: 2014-05-20 | Disposition: A | Discharge status: Home / Self Care | Payer: PRIVATE HEALTH INSURANCE | Source: Ambulatory Visit | Attending: Urology | Admitting: Urology

## 2014-05-20 DIAGNOSIS — N210 Calculus in bladder: Secondary | ICD-10-CM | POA: Insufficient documentation

## 2014-05-20 DIAGNOSIS — N200 Calculus of kidney: Secondary | ICD-10-CM

## 2014-05-25 ENCOUNTER — Ambulatory Visit: Payer: PRIVATE HEALTH INSURANCE | Admitting: Urology

## 2014-06-10 ENCOUNTER — Encounter (HOSPITAL_COMMUNITY): Payer: Self-pay | Admitting: Cardiology

## 2014-07-30 ENCOUNTER — Ambulatory Visit (INDEPENDENT_AMBULATORY_CARE_PROVIDER_SITE_OTHER): Payer: PRIVATE HEALTH INSURANCE | Admitting: Urology

## 2014-07-30 DIAGNOSIS — N200 Calculus of kidney: Secondary | ICD-10-CM

## 2014-07-30 DIAGNOSIS — N210 Calculus in bladder: Secondary | ICD-10-CM

## 2014-09-08 ENCOUNTER — Ambulatory Visit (INDEPENDENT_AMBULATORY_CARE_PROVIDER_SITE_OTHER): Payer: PRIVATE HEALTH INSURANCE | Admitting: Cardiology

## 2014-09-08 ENCOUNTER — Encounter: Payer: Self-pay | Admitting: Cardiology

## 2014-09-08 VITALS — BP 118/80 | HR 76 | Ht 71.0 in | Wt 173.0 lb

## 2014-09-08 DIAGNOSIS — I10 Essential (primary) hypertension: Secondary | ICD-10-CM

## 2014-09-08 DIAGNOSIS — I714 Abdominal aortic aneurysm, without rupture, unspecified: Secondary | ICD-10-CM

## 2014-09-08 NOTE — Progress Notes (Signed)
Clinical Summary [name] is a 64 y.o.male  1. CAD  - prior 3 vessel CABG in 09/2012 (LIMA to LAD, SVG to ramus, SVG to OM), history of PCI prior to that. 09/2012 LVEF 60-65% by echo  - no recent chest pain. No SOB or DOE - compliant with meds  2. HTN  - checks bp at home at times. Typically 120-130s/80s.  - compliant with meds   3. HL  - compliant with statin  - no recent panel in our system, reports upcoming labs with diabetes doctor.   4. AAA  - [place] 09/2013 3.5 x 4.5, slightly larger than prior study a year prior (3.3x3.9) - denies any symptoms Past Medical History  Diagnosis Date  . ASCVD (arteriosclerotic cardiovascular disease)     LAD stent in 1999; RCA bare-metal stent in 12/2007, LAD stent patent, 50-60% proximal Cx lesion treated medically; 09/2012: CABG x3, LIMA to LAD, Sequential SVG to ramus intermediate and OM [name] with EVH via right thigh  . AAA (abdominal aortic aneurysm)     3.3 cm in 2012  . Hyperlipidemia   . Diabetes mellitus   . Thrombocytopenia     Chronic; evaluated by Dr. Tressie Stalker in 2007  . Tobacco abuse   . Hepatic steatosis   . ED (erectile dysfunction)   . Nephrolithiasis   . Anxiety   . Depression   . Insomnia   . Gastric ulcer   . Coronary artery disease   . MI (myocardial infarction)   . THROMBOCYTOPENIA 08/09/2010    Platelets 88-114,000 over the past 4 years; prior evaluation by Dr. Tressie Stalker without a specific etiology identified.  01/2012: Normal CBC ex platelets-120   . ABDOMINAL AORTIC ANEURYSM 11/07/2009    Diameter of 3.3 cm by CT in 12/2010   . NEPHROLITHIASIS, RECURRENT 08/09/2010    Obstruction of the ureter on CT scan of the abdomen resulting in right hydronephrosis in 12/2010; bladder outlet obstruction prior to that.  CT also revealed atherosclerotic changes in the abdominal arterial vessels, hepatic granuloma and calcification of the prostate.   Marland Kitchen HYPERLIPIDEMIA 08/09/2010    Lipid profile in 07/2010:144, 124, 34, 86;  01/2012:93, 82, 34, 43   . DIABETES MELLITUS, TYPE II 08/09/2010    no insulin    . S/P CABG x 3 10/07/2012    LIMA to LAD, Sequential SVG to ramus intermediate and OM [name] with EVH via right thigh     Allergies  Allergen Reactions  . Meperidine Hcl Other (See Comments)    Was mixed with phenergan and turned blood vessels red.   . Promethazine Hcl Other (See Comments)    Was mixed with the Demerol and caused veins to turn red.      Current Outpatient Prescriptions  Medication Sig Dispense Refill  . aspirin EC 81 MG tablet Take 81 mg by mouth daily.    Marland Kitchen atorvastatin (LIPITOR) 20 MG tablet Take 20 mg by mouth daily at 6 PM.     . fish oil-omega-3 fatty acids 1000 MG capsule Take 1 capsule by mouth 2 (two) times daily.     . hydrochlorothiazide (HYDRODIURIL) 25 MG tablet Take 1 tablet (25 mg total) by mouth daily. 90 tablet 3  . Linagliptin-Metformin HCl (JENTADUETO) 2.5-500 MG TABS Take 2 tablets by mouth 2 (two) times daily.     . metoprolol tartrate (LOPRESSOR) 25 MG tablet Take 1 tablet (25 mg total) by mouth 2 (two) times daily. 180 tablet 3  . NEXIUM  24HR 20 MG capsule TAKE 1 TABLET BY MOUTH 30 MINUTES BEFORE BREAKFAST 28 capsule 11  . pantoprazole (PROTONIX) 20 MG tablet Take 20 mg by mouth daily.     . ramipril (ALTACE) 10 MG capsule Take 2 capsules (20 mg total) by mouth daily with breakfast. 180 capsule 3  . tadalafil (CIALIS) 10 MG tablet Take 10 mg by mouth daily as needed for erectile dysfunction.      No current facility-administered medications for this visit.     Past Surgical History  Procedure Laterality Date  . Lithotripsy    . Inguinal hernia repair    . Coronary angioplasty with stent placement      1999, 2009  . Esophagogastroduodenoscopy  04/04/2012    Procedure: ESOPHAGOGASTRODUODENOSCOPY (EGD);  Surgeon: Rogene Houston, MD;  [facility];  Service: Endoscopy;  Laterality: N/A;  830  . Esophagogastroduodenoscopy  07/25/2012    Procedure:  ESOPHAGOGASTRODUODENOSCOPY (EGD);  Surgeon: Rogene Houston, MD;  [facility];  Service: Endoscopy;  Laterality: N/A;  830  . Coronary artery bypass graft N/A 10/07/2012    Procedure: CORONARY ARTERY BYPASS GRAFTING (CABG);  Surgeon: Rexene Alberts, MD;  Location: [place];  Service: Open Heart Surgery;  Laterality: N/A;  . Intraoperative transesophageal echocardiogram N/A 10/07/2012    Procedure: INTRAOPERATIVE TRANSESOPHAGEAL ECHOCARDIOGRAM;  Surgeon: Rexene Alberts, MD;  Location: [place];  Service: Open Heart Surgery;  Laterality: N/A;  . Left heart catheterization with coronary angiogram N/A 10/03/2012    Procedure: LEFT HEART CATHETERIZATION WITH CORONARY ANGIOGRAM;  Surgeon: Minus Breeding, MD;  Location: [facility];  Service: Cardiovascular;  Laterality: N/A;     Allergies  Allergen Reactions  . Meperidine Hcl Other (See Comments)    Was mixed with phenergan and turned blood vessels red.   . Promethazine Hcl Other (See Comments)    Was mixed with the Demerol and caused veins to turn red.       Family History  Problem Relation Age of Onset  . Ovarian cancer Sister      Social History [name] reports that he quit smoking about 14 months ago. His smoking use included Cigarettes. He has a 20 pack-year smoking history. He has never used smokeless tobacco. [name] reports that he does not drink alcohol.   Review of Systems CONSTITUTIONAL: No weight loss, fever, chills, weakness or fatigue.  HEENT: Eyes: No visual loss, blurred vision, double vision or yellow sclerae.No hearing loss, sneezing, congestion, runny nose or sore throat.  SKIN: No rash or itching.  CARDIOVASCULAR: per HPI RESPIRATORY: No shortness of breath, cough or sputum.  GASTROINTESTINAL: No anorexia, nausea, vomiting or diarrhea. No abdominal pain or blood.  GENITOURINARY: No burning on urination, no polyuria NEUROLOGICAL: No headache, dizziness, syncope, paralysis, ataxia, numbness or tingling in the  extremities. No change in bowel or bladder control.  MUSCULOSKELETAL: No muscle, back pain, joint pain or stiffness.  LYMPHATICS: No enlarged nodes. No history of splenectomy.  PSYCHIATRIC: No history of depression or anxiety.  ENDOCRINOLOGIC: No reports of sweating, cold or heat intolerance. No polyuria or polydipsia.  Marland Kitchen   Physical Examination p 76 bp 118/80 Wt 173 lbs BMI 24 Gen: resting comfortably, no acute distress HEENT: no scleral icterus, pupils equal round and reactive, no palptable cervical adenopathy,  CV: RRR, no m/r/g, no JVD Resp: Clear to auscultation bilaterally GI: abdomen is soft, non-tender, non-distended, normal bowel sounds, no hepatosplenomegaly MSK: extremities are warm, no edema.  Skin: warm,  no rash Neuro:  no focal deficits Psych: appropriate affect   Diagnostic Studies  09/2012 Echo: LVEF 55-60%, no WMAs,   09/2012 AAA [place]: 3.3 x 3.9 cm, stable from prior CT  08/2013 AAA [place] Infrarenal abdominal aortic aneurysm measuring 3.5 x 4.5 cm.  Previous examination of 10/06/2012 demonstrated the abdominal aorta  measuring 3.3 x 3.9 cm.   Assessment and Plan  1. CAD  - no current symptoms  - continue risk factor modification and secondary prevention   2. HTN  - at goal, continue current meds.   3. HL  - continue current statin, will request most recent lipid panel.   4. AAA  - repeat surveillance [place]    F/u 1 year  Arnoldo Lenis, M.D.

## 2014-09-08 NOTE — Patient Instructions (Signed)
Your physician wants you to follow-up in: 1 year with Dr Bryna Colander will receive a reminder letter in the mail two months in advance. If you don't receive a letter, please call our office to schedule the follow-up appointment.   Your physician recommends that you continue on your current medications as directed. Please refer to the Current Medication list given to you today.    Your physician has requested that you have an abdominal aorta duplex. During this test, an ultrasound is used to evaluate the aorta. Allow 30 minutes for this exam. Do not eat after midnight the day before and avoid carbonated beverages     Thank you for choosing [facility] !

## 2014-09-10 ENCOUNTER — Ambulatory Visit (HOSPITAL_COMMUNITY)
Admission: RE | Admit: 2014-09-10 | Discharge: 2014-09-10 | Disposition: A | Discharge status: Home / Self Care | Payer: PRIVATE HEALTH INSURANCE | Source: Ambulatory Visit | Attending: Cardiology | Admitting: Cardiology

## 2014-09-10 DIAGNOSIS — I714 Abdominal aortic aneurysm, without rupture, unspecified: Secondary | ICD-10-CM

## 2014-11-13 ENCOUNTER — Other Ambulatory Visit (INDEPENDENT_AMBULATORY_CARE_PROVIDER_SITE_OTHER): Payer: Self-pay | Admitting: Internal Medicine

## 2015-03-10 ENCOUNTER — Other Ambulatory Visit: Payer: Self-pay | Admitting: Cardiology

## 2015-03-11 ENCOUNTER — Other Ambulatory Visit: Payer: Self-pay | Admitting: Cardiovascular Disease

## 2015-03-11 MED ORDER — RAMIPRIL 10 MG PO CAPS: 20.0000 mg | ORAL_CAPSULE | Freq: Every day | ORAL | Status: DC

## 2015-03-11 MED ORDER — HYDROCHLOROTHIAZIDE 25 MG PO TABS: ORAL_TABLET | ORAL | Status: DC

## 2015-03-11 MED ORDER — METOPROLOL TARTRATE 25 MG PO TABS: 25.0000 mg | ORAL_TABLET | Freq: Two times a day (BID) | ORAL | Status: DC

## 2015-03-11 NOTE — Telephone Encounter (Signed)
Needs refills on Ramipril, Hydrochlorathiazide and metoprolol sent to [facility] [place] / tg

## 2015-03-11 NOTE — Telephone Encounter (Signed)
RX refilled& sent to [facility] in [place]

## 2015-06-14 ENCOUNTER — Other Ambulatory Visit: Payer: Self-pay | Admitting: Urology

## 2015-06-14 DIAGNOSIS — N200 Calculus of kidney: Secondary | ICD-10-CM

## 2015-07-18 ENCOUNTER — Ambulatory Visit (HOSPITAL_COMMUNITY)
Admission: RE | Admit: 2015-07-18 | Discharge: 2015-07-18 | Disposition: A | Discharge status: Home / Self Care | Payer: PRIVATE HEALTH INSURANCE | Source: Ambulatory Visit | Attending: Urology | Admitting: Urology

## 2015-07-18 ENCOUNTER — Other Ambulatory Visit: Payer: Self-pay | Admitting: Urology

## 2015-07-18 DIAGNOSIS — N210 Calculus in bladder: Secondary | ICD-10-CM

## 2015-07-18 DIAGNOSIS — N281 Cyst of kidney, acquired: Secondary | ICD-10-CM | POA: Insufficient documentation

## 2015-07-18 DIAGNOSIS — N200 Calculus of kidney: Secondary | ICD-10-CM | POA: Diagnosis present

## 2015-07-29 ENCOUNTER — Ambulatory Visit (INDEPENDENT_AMBULATORY_CARE_PROVIDER_SITE_OTHER): Payer: PRIVATE HEALTH INSURANCE | Admitting: Urology

## 2015-07-29 DIAGNOSIS — N201 Calculus of ureter: Secondary | ICD-10-CM

## 2015-07-29 DIAGNOSIS — N210 Calculus in bladder: Secondary | ICD-10-CM

## 2015-08-04 ENCOUNTER — Encounter: Payer: Self-pay | Admitting: Adult Health

## 2015-08-04 ENCOUNTER — Ambulatory Visit (INDEPENDENT_AMBULATORY_CARE_PROVIDER_SITE_OTHER): Payer: PRIVATE HEALTH INSURANCE | Admitting: Adult Health

## 2015-08-04 VITALS — BP 108/70 | HR 78 | Ht 71.0 in | Wt 166.0 lb

## 2015-08-04 DIAGNOSIS — I2510 Atherosclerotic heart disease of native coronary artery without angina pectoris: Secondary | ICD-10-CM

## 2015-08-04 DIAGNOSIS — I10 Essential (primary) hypertension: Secondary | ICD-10-CM

## 2015-08-04 DIAGNOSIS — E0800 Diabetes mellitus due to underlying condition with hyperosmolarity without nonketotic hyperglycemic-hyperosmolar coma (NKHHC): Secondary | ICD-10-CM

## 2015-08-04 NOTE — Progress Notes (Signed)
Cardiology Office Note   Date:  08/04/2015   ID:  Burech, Marchesano 1950/10/19, MRN [id number]  PCP:  Purvis Kilts, MD  Cardiologist: Cloria Spring, NP   No chief complaint on file.     History of Present Illness: [name] is a 65 y.o. male who presents for ongoing assessment and management of coronary artery disease, with history of CABG in April of 2014 (LIMA to LAD, SVG to ramus, SVG to OM,), history PCI.  Prior to that.  Most recent LVEF 60-65% by echo.  Other history includes hypertension, hyperlipidemia, and AAA, ultrasound, and 4 2015 measured 3.5 x 4.5, slightly larger than prior study.  His last seen by Dr. Harl Bowie on 09/08/2014.  He was completely asymptomatic.  He was continued on risk factor modification.  Secondary prevention with a repeat surveillance ultrasound to follow.ultrasound completed on 09/10/2014 revealed infrarenal AAA at 3.5 x 4.4 cm.recommendations to followup ultrasound in 3 years, as this was stable.  The patient states, that he has been doing well with the exception of his blood glucose.  He is being followed at [facility] practice for diabetes control.  He states that his medications had been changed and his blood glucoses running in the high 300s and low 400s.  He has not been back to be reevaluated.  He denies any chest pain, dyspnea on exertion, or weakness.  Past Medical History  Diagnosis Date  . ASCVD (arteriosclerotic cardiovascular disease)     LAD stent in 1999; RCA bare-metal stent in 12/2007, LAD stent patent, 50-60% proximal Cx lesion treated medically; 09/2012: CABG x3, LIMA to LAD, Sequential SVG to ramus intermediate and OM branch with EVH via right thigh  . AAA (abdominal aortic aneurysm) (HCC)     3.3 cm in 2012  . Hyperlipidemia   . Diabetes mellitus   . Thrombocytopenia ([place])     Chronic; evaluated by Dr. Tressie Stalker in 2007  . Tobacco abuse   . Hepatic steatosis   . ED (erectile dysfunction)   .  Nephrolithiasis   . Anxiety   . Depression   . Insomnia   . Gastric ulcer   . Coronary artery disease   . MI (myocardial infarction) ([place])   . THROMBOCYTOPENIA 08/09/2010    Platelets 88-114,000 over the past 4 years; prior evaluation by Dr. Tressie Stalker without a specific etiology identified.  01/2012: Normal CBC ex platelets-120   . ABDOMINAL AORTIC ANEURYSM 11/07/2009    Diameter of 3.3 cm by CT in 12/2010   . NEPHROLITHIASIS, RECURRENT 08/09/2010    Obstruction of the ureter on CT scan of the abdomen resulting in right hydronephrosis in 12/2010; bladder outlet obstruction prior to that.  CT also revealed atherosclerotic changes in the abdominal arterial vessels, hepatic granuloma and calcification of the prostate.   Marland Kitchen HYPERLIPIDEMIA 08/09/2010    Lipid profile in 07/2010:144, 124, 34, 86; 01/2012:93, 82, 34, 43   . DIABETES MELLITUS, TYPE II 08/09/2010    no insulin    . S/P CABG x 3 10/07/2012    LIMA to LAD, Sequential SVG to ramus intermediate and OM branch with EVH via right thigh    Past Surgical History  Procedure Laterality Date  . Lithotripsy    . Inguinal hernia repair    . Coronary angioplasty with stent placement      1999, 2009  . Esophagogastroduodenoscopy  04/04/2012    Procedure: ESOPHAGOGASTRODUODENOSCOPY (EGD);  Surgeon: Rogene Houston, MD;  [facility];  Service: Endoscopy;  Laterality: N/A;  830  . Esophagogastroduodenoscopy  07/25/2012    Procedure: ESOPHAGOGASTRODUODENOSCOPY (EGD);  Surgeon: Rogene Houston, MD;  [facility];  Service: Endoscopy;  Laterality: N/A;  830  . Coronary artery bypass graft N/A 10/07/2012    Procedure: CORONARY ARTERY BYPASS GRAFTING (CABG);  Surgeon: Rexene Alberts, MD;  Location: [facility];  Service: Open Heart Surgery;  Laterality: N/A;  . Intraoperative transesophageal echocardiogram N/A 10/07/2012    Procedure: INTRAOPERATIVE TRANSESOPHAGEAL ECHOCARDIOGRAM;  Surgeon: Rexene Alberts, MD;  Location: [facility];  Service: Open Heart  Surgery;  Laterality: N/A;  . Left heart catheterization with coronary angiogram N/A 10/03/2012    Procedure: LEFT HEART CATHETERIZATION WITH CORONARY ANGIOGRAM;  Surgeon: Minus Breeding, MD;  Location: [facility] CATH LAB;  Service: Cardiovascular;  Laterality: N/A;     Current Outpatient Prescriptions  Medication Sig Dispense Refill  . aspirin EC 81 MG tablet Take 81 mg by mouth daily.    Marland Kitchen atorvastatin (LIPITOR) 20 MG tablet Take 20 mg by mouth daily at 6 PM.     . fish oil-omega-3 fatty acids 1000 MG capsule Take 1 capsule by mouth 2 (two) times daily.     . hydrochlorothiazide (HYDRODIURIL) 25 MG tablet TAKE 1 TABLET (25 MG TOTAL) BY MOUTH DAILY. 90 tablet 3  . JANUMET XR [id number] MG TB24 Take 1 tablet by mouth daily.  5  . metoprolol tartrate (LOPRESSOR) 25 MG tablet Take 1 tablet (25 mg total) by mouth 2 (two) times daily. 180 tablet 3  . ramipril (ALTACE) 10 MG capsule Take 2 capsules (20 mg total) by mouth daily with breakfast. 180 capsule 3  . tadalafil (CIALIS) 10 MG tablet Take 10 mg by mouth daily as needed for erectile dysfunction.      No current facility-administered medications for this visit.    Allergies:   Meperidine hcl and Promethazine hcl    Social History:  The patient  reports that he quit smoking about 2 years ago. His smoking use included Cigarettes. He has a 20 pack-year smoking history. He has never used smokeless tobacco. He reports that he does not drink alcohol or use illicit drugs.   Family History:  The patient's family history includes Ovarian cancer in his sister.    ROS: All other systems are reviewed and negative. Unless otherwise mentioned in H&P    PHYSICAL EXAM: VS:  BP 108/70 mmHg  Pulse 78  Ht 5\' 11"  (1.803 m)  Wt 166 lb (75.297 kg)  BMI 23.16 kg/m2  SpO2 96% , BMI Body mass index is 23.16 kg/(m^2). GEN: Well nourished, well developed, in no acute distress HEENT: normal Neck: no JVD, carotid bruits, or masses Cardiac: RRR; no murmurs, rubs, or  gallops,no edema  Respiratory:  clear to auscultation bilaterally, normal work of breathing GI: soft, nontender, nondistended, + BS MS: no deformity or atrophy Skin: warm and dry, no rash Neuro:  Strength and sensation are intact Psych: euthymic mood, full affect  Recent Labs: No results found for requested labs within last 365 days.    Lipid Panel    Component Value Date/Time   CHOL 77 10/04/2012 0515   TRIG 113 10/04/2012 0515   TRIG 124 07/28/2010   HDL 30* 10/04/2012 0515   CHOLHDL 2.6 10/04/2012 0515   VLDL 23 10/04/2012 0515   LDLCALC 24 10/04/2012 0515   LDLCALC 86 07/28/2010      Wt Readings from Last 3 Encounters:  08/04/15 166 lb (75.297 kg)  09/08/14 173 lb (78.472 kg)  08/31/13 175 lb (79.379 kg)       ASSESSMENT AND PLAN:  1.  CAD, status post coronary bypass grafting, 2014.: He is completely asymptomatic.  I am concerned about his blood glucose being high.from a cardiac standpoint, he appears stable. Would continue her Toprol, statin, and aspirin therapy.  He appears stable from a cardiac standpoint to proceed with procedure to remove kidney stones from bladder and undergo anesthesia.  2. Hypertension:blood pressure is soft.  He is on HCTZ, along with 20 mg of REM of her O.  I would like to review his labs to distinguish whether or not.  His kidney function has been affected by these medications.  May consider taking him off HCTZ or reducing ramapril.  Once the labs are, reviewed.  We will make further recommendations.  3. Diabetes: he is concerned about his elevated blood glucose since medication changes were made.  [facility] with [facility] and have arranged a followup appointment to be seen in their office on August 09, 2015 at 8:15. They plan to draw labs at that time.  I will get a copy of the labs once they are available.  He may need to have his blood glucose control better before proceeding with surgery.  Current medicines are reviewed  at length with the patient today.    Labs/ tests ordered today include: No orders of the defined types were placed in this encounter.     Disposition:   FU with 2 weeks.    Signed, Jory Sims, NP  08/04/2015 4:37 PM    [facility] [address], [place], [place] [address] Phone: [phone]; Fax: [phone]

## 2015-08-04 NOTE — Progress Notes (Deleted)
Name: [name]    DOB: 08-01-1950  Age: 65 y.o.  MR#: [id number]       PCP:  Purvis Kilts, MD      Insurance: Payor: MEDCOST / Plan: MEDCOST / Product Type: *No Product type* /   CC:   No chief complaint on file.   VS Filed Vitals:   08/04/15 1440  BP: 108/70  Pulse: 78  Height: 5\' 11"  (1.803 m)  Weight: 166 lb (75.297 kg)  SpO2: 96%    Weights Current Weight  08/04/15 166 lb (75.297 kg)  09/08/14 173 lb (78.472 kg)  08/31/13 175 lb (79.379 kg)    Blood Pressure  BP Readings from Last 3 Encounters:  08/04/15 108/70  09/08/14 118/80  03/09/14 150/92     Admit date:  (Not on file) Last encounter with RMR:  Visit date not found   Allergy Meperidine hcl and Promethazine hcl  Current Outpatient Prescriptions  Medication Sig Dispense Refill  . aspirin EC 81 MG tablet Take 81 mg by mouth daily.    Marland Kitchen atorvastatin (LIPITOR) 20 MG tablet Take 20 mg by mouth daily at 6 PM.     . fish oil-omega-3 fatty acids 1000 MG capsule Take 1 capsule by mouth 2 (two) times daily.     . hydrochlorothiazide (HYDRODIURIL) 25 MG tablet TAKE 1 TABLET (25 MG TOTAL) BY MOUTH DAILY. 90 tablet 3  . JANUMET XR [phone] MG TB24 Take 1 tablet by mouth daily.  5  . metoprolol tartrate (LOPRESSOR) 25 MG tablet Take 1 tablet (25 mg total) by mouth 2 (two) times daily. 180 tablet 3  . ramipril (ALTACE) 10 MG capsule Take 2 capsules (20 mg total) by mouth daily with breakfast. 180 capsule 3  . tadalafil (CIALIS) 10 MG tablet Take 10 mg by mouth daily as needed for erectile dysfunction.      No current facility-administered medications for this visit.    Discontinued Meds:    Medications Discontinued During This Encounter  Medication Reason  . NEXIUM 24HR 20 MG capsule Error  . Linagliptin-Metformin HCl (JENTADUETO) 2.5-500 MG TABS Error    Patient Active Problem List   Diagnosis Date Noted  . Ureteral stone 07/09/2013  . Hypertension 10/02/2012  . DIABETES MELLITUS, TYPE II  08/09/2010  . HYPERLIPIDEMIA 08/09/2010  . THROMBOCYTOPENIA 08/09/2010  . Arteriosclerotic cardiovascular disease (ASCVD) 08/09/2010  . NEPHROLITHIASIS, RECURRENT 08/09/2010  . ERECTILE DYSFUNCTION, ORGANIC 08/09/2010  . INSOMNIA 08/09/2010  . Abdominal aortic aneurysm ([place]) 11/07/2009    LABS    Component Value Date/Time   NA 141 05/26/2013 0724   NA 140 10/10/2012 0600   NA 141 10/09/2012 0430   K 4.1 05/26/2013 0724   K 3.9 10/10/2012 0600   K 3.6 10/09/2012 0430   CL 106 05/26/2013 0724   CL 106 10/10/2012 0600   CL 107 10/09/2012 0430   CO2 26 05/26/2013 0724   CO2 26 10/10/2012 0600   CO2 30 10/09/2012 0430   GLUCOSE 128* 05/26/2013 0724   GLUCOSE 117* 10/10/2012 0600   GLUCOSE 83 10/09/2012 0430   BUN 18 05/26/2013 0724   BUN 16 10/10/2012 0600   BUN 17 10/09/2012 0430   CREATININE 1.19 05/26/2013 0724   CREATININE 1.11 10/10/2012 0600   CREATININE 1.14 10/09/2012 0430   CREATININE 0.90 10/08/2012 1718   CALCIUM 9.3 05/26/2013 0724   CALCIUM 8.9 10/10/2012 0600   CALCIUM 8.5 10/09/2012 0430   GFRNONAA 71* 10/10/2012 0600   GFRNONAA 69* 10/09/2012  0430   GFRNONAA 89* 10/08/2012 1700   GFRAA 82* 10/10/2012 0600   GFRAA 80* 10/09/2012 0430   GFRAA >90 10/08/2012 1700   CMP     Component Value Date/Time   NA 141 05/26/2013 0724   K 4.1 05/26/2013 0724   CL 106 05/26/2013 0724   CO2 26 05/26/2013 0724   GLUCOSE 128* 05/26/2013 0724   BUN 18 05/26/2013 0724   CREATININE 1.19 05/26/2013 0724   CREATININE 1.11 10/10/2012 0600   CALCIUM 9.3 05/26/2013 0724   PROT 6.9 10/05/2012 0446   ALBUMIN 3.7 10/05/2012 0446   AST 20 10/05/2012 0446   ALT 33 10/05/2012 0446   ALKPHOS 76 10/05/2012 0446   BILITOT 0.4 10/05/2012 0446   GFRNONAA 71* 10/10/2012 0600   GFRAA 82* 10/10/2012 0600       Component Value Date/Time   WBC 8.8 10/10/2012 0600   WBC 8.6 10/09/2012 0430   WBC 10.8* 10/08/2012 1700   HGB 11.7* 10/10/2012 0600   HGB 11.3* 10/09/2012 0430    HGB 11.2* 10/08/2012 1718   HCT 34.9* 10/10/2012 0600   HCT 33.4* 10/09/2012 0430   HCT 33.0* 10/08/2012 1718   MCV 85.3 10/10/2012 0600   MCV 85.6 10/09/2012 0430   MCV 85.2 10/08/2012 1700    Lipid Panel     Component Value Date/Time   CHOL 77 10/04/2012 0515   TRIG 113 10/04/2012 0515   TRIG 124 07/28/2010   HDL 30* 10/04/2012 0515   CHOLHDL 2.6 10/04/2012 0515   VLDL 23 10/04/2012 0515   LDLCALC 24 10/04/2012 0515   LDLCALC 86 07/28/2010    ABG    Component Value Date/Time   PHART 7.348* 10/08/2012 0212   PCO2ART 45.2* 10/08/2012 0212   PO2ART 145.0* 10/08/2012 0212   HCO3 24.8* 10/08/2012 0212   TCO2 26 10/08/2012 1718   ACIDBASEDEF 1.0 10/08/2012 0212   O2SAT 99.0 10/08/2012 0212     Lab Results  Component Value Date   TSH 0.62 04/13/2008   BNP (last 3 results) No results for input(s): BNP in the last 8760 hours.  ProBNP (last 3 results) No results for input(s): PROBNP in the last 8760 hours.  Cardiac Panel (last 3 results) No results for input(s): CKTOTAL, CKMB, TROPONINI, RELINDX in the last 72 hours.  Iron/TIBC/Ferritin/ %Sat No results found for: IRON, TIBC, FERRITIN, IRONPCTSAT   EKG Orders placed or performed in visit on 09/08/14  . EKG 12-Lead     Prior Assessment and Plan Problem List as of 08/04/2015      Cardiovascular and Mediastinum   Abdominal aortic aneurysm [facility])   Last Assessment & Plan 11/27/2012 Office Visit Edited 11/28/2012  4:17 PM by Yehuda Savannah, MD    Significant increase in size of abdominal aortic aneurysm between 2012 and 2014; repeat imaging study in one year is warranted.      Arteriosclerotic cardiovascular disease (ASCVD)   Last Assessment & Plan 11/27/2012 Office Visit Written 11/27/2012  4:12 PM by Yehuda Savannah, MD    Patient is doing well following CABG. He is advised that he can resume work in 4 weeks and attempt to return to golf game in 6 weeks.      Hypertension   Last Assessment & Plan 11/27/2012  Office Visit Written 11/27/2012  4:14 PM by Yehuda Savannah, MD    No blood pressure elevations identified within the past few months. Metoprolol dosage will be decreased. Patient advised to check blood pressure on occasion and  to notify us of any significant elevations.        Endocrine   DIABETES MELLITUS, TYPE II     Genitourinary   NEPHROLITHIASIS, RECURRENT   Last Assessment & Plan 02/26/2012 Office Visit Written 02/26/2012  5:48 PM by Yehuda Savannah, MD    Patient has had multiple frequent recurrent episodes of symptomatic nephrolithiasis.  He is followed by Dr. Jeffie Pollock for this problem.      ERECTILE DYSFUNCTION, ORGANIC   Ureteral stone     Other   HYPERLIPIDEMIA   Last Assessment & Plan 11/27/2012 Office Visit Written 11/27/2012  4:12 PM by Yehuda Savannah, MD    Extremely low total and LDL cholesterol on current therapy, which will be continued.      THROMBOCYTOPENIA   Last Assessment & Plan 11/27/2012 Office Visit Written 11/27/2012  4:15 PM by Yehuda Savannah, MD    Significant thrombocytopenia persists and is in fact somewhat worse than in 01/2012. In the absence of a bleeding diathesis, no further evaluation or treatment is warranted at present.      INSOMNIA   Last Assessment & Plan 02/26/2012 Office Visit Edited 03/02/2012  1:51 PM by Yehuda Savannah, MD    Patient reports using hypnotics 3-4 times per week.  He does not note mental clouding in the morning.  I suggest he attempt to minimize use of these agents but to continue to take them as necessary to provide reasonable sleep.           Imaging: Dg Abd 1 View  07/18/2015  CLINICAL DATA:  History of kidney stones, bladder calculus EXAM: ABDOMEN - 1 VIEW COMPARISON:  05/20/2014 FINDINGS: Normal small bowel gas pattern. There is a spiculated calcified calculus within right aspect of the urinary bladder measures at least 1.7 cm. Question second calculus within bladder upper aspect midline measures 7 mm. Left pelvic  phleboliths are stable. Stable sclerotic lesion in left iliac bone. IMPRESSION: Spiculated calcified calculus in right aspect of the urinary bladder measures 1.7 cm. Question second calculus within upper aspect of the bladder measures 7 mm. Stable left pelvic phleboliths. Electronically Signed   By: Lahoma Crocker M.D.   On: 07/18/2015 10:46   US Renal  07/18/2015  CLINICAL DATA:  Nephrolithiasis EXAM: RENAL / URINARY TRACT ULTRASOUND COMPLETE COMPARISON:  10/05/2013 FINDINGS: Right Kidney: Length: 12.0 cm. Echogenicity within normal limits. No mass or hydronephrosis visualized. No definite stones visualized. Left Kidney: Length: 11.6 cm. Multiple cysts, the largest in the midpole measuring 3.2 cm. No hydronephrosis. No definite visualized stones. Bladder: Decompressed. IMPRESSION: Left renal cysts. No definite stones visualized within the kidneys currently. No hydronephrosis. Electronically Signed   By: Rolm Baptise M.D.   On: 07/18/2015 10:43

## 2015-08-04 NOTE — Patient Instructions (Signed)
Your physician wants you to follow-up in: 6 Months with Dr. Harl Bowie.  You will receive a reminder letter in the mail two months in advance. If you don't receive a letter, please call our office to schedule the follow-up appointment.  Your physician recommends that you continue on your current medications as directed. Please refer to the Current Medication list given to you today.  We will call you with results   If you need a refill on your cardiac medications before your next appointment, please call your pharmacy.  Thank you for choosing [facility]!

## 2015-08-15 ENCOUNTER — Other Ambulatory Visit: Payer: Self-pay | Admitting: Urology

## 2015-08-18 NOTE — Patient Instructions (Addendum)
YOUR PROCEDURE IS SCHEDULED ON : 08/30/15  REPORT TO [facility] MAIN ENTRANCE FOLLOW SIGNS TO EAST ELEVATOR - GO TO 3rd FLOOR CHECK IN AT 3 EAST NURSES STATION (SHORT STAY) AT: 8:00 AM  CALL THIS NUMBER IF YOU HAVE PROBLEMS THE MORNING OF SURGERY [phone]  REMEMBER:ONLY 1 PER PERSON MAY GO TO SHORT STAY WITH YOU TO GET READY THE MORNING OF YOUR SURGERY  DO NOT EAT FOOD OR DRINK LIQUIDS AFTER MIDNIGHT  TAKE THESE MEDICINES THE MORNING OF SURGERY: metoprolol, atorvastatin  YOU MAY NOT HAVE ANY METAL ON YOUR BODY INCLUDING HAIR PINS AND PIERCING'S. DO NOT WEAR JEWELRY, MAKEUP, LOTIONS, POWDERS OR PERFUMES. DO NOT WEAR NAIL POLISH. DO NOT SHAVE 48 HRS PRIOR TO SURGERY. MEN MAY SHAVE FACE AND NECK.  DO NOT [place]. [facility] IS NOT RESPONSIBLE FOR VALUABLES.  CONTACTS, DENTURES OR PARTIALS MAY NOT BE WORN TO SURGERY. LEAVE SUITCASE IN CAR. CAN BE BROUGHT TO ROOM AFTER SURGERY.  PATIENTS DISCHARGED THE DAY OF SURGERY WILL NOT BE ALLOWED TO DRIVE HOME.  PLEASE READ OVER THE FOLLOWING INSTRUCTION SHEETS _________________________________________________________________________________                                          [facility] - PREPARING FOR SURGERY  Before surgery, you can play an important role.  Because skin is not sterile, your skin needs to be as free of germs as possible.  You can reduce the number of germs on your skin by washing with CHG (chlorahexidine gluconate) soap before surgery.  CHG is an antiseptic cleaner which kills germs and bonds with the skin to continue killing germs even after washing. Please DO NOT use if you have an allergy to CHG or antibacterial soaps.  If your skin becomes reddened/irritated stop using the CHG and inform your nurse when you arrive at Short Stay. Do not shave (including legs and underarms) for at least 48 hours prior to the first CHG shower.  You may shave your face. Please follow these  instructions carefully:   1.  Shower with CHG Soap the night before surgery and the  morning of Surgery.   2.  If you choose to wash your hair, wash your hair first as usual with your  normal  Shampoo.   3.  After you shampoo, rinse your hair and body thoroughly to remove the  shampoo.                                         4.  Use CHG as you would any other liquid soap.  You can apply chg directly  to the skin and wash . Gently wash with scrungie or clean wascloth    5.  Apply the CHG Soap to your body ONLY FROM THE NECK DOWN.   Do not use on open                           Wound or open sores. Avoid contact with eyes, ears mouth and genitals (private parts).                        Genitals (private parts) with your normal soap.  6.  Wash thoroughly, paying special attention to the area where your surgery  will be performed.   7.  Thoroughly rinse your body with warm water from the neck down.   8.  DO NOT shower/wash with your normal soap after using and rinsing off  the CHG Soap .                9.  Pat yourself dry with a clean towel.             10.  Wear clean night clothes to bed after shower             11.  Place clean sheets on your bed the night of your first shower and do not  sleep with pets.  Day of Surgery : Do not apply any lotions/deodorants the morning of surgery.  Please wear clean clothes to the hospital/surgery center.  FAILURE TO FOLLOW THESE INSTRUCTIONS MAY RESULT IN THE CANCELLATION OF YOUR SURGERY    PATIENT SIGNATURE_________________________________  ______________________________________________________________________

## 2015-08-24 ENCOUNTER — Encounter (HOSPITAL_COMMUNITY)
Admission: RE | Admit: 2015-08-24 | Discharge: 2015-08-24 | Disposition: A | Discharge status: Home / Self Care | Payer: PRIVATE HEALTH INSURANCE | Source: Ambulatory Visit | Attending: Urology | Admitting: Urology

## 2015-08-24 ENCOUNTER — Encounter (HOSPITAL_COMMUNITY): Payer: Self-pay

## 2015-08-24 DIAGNOSIS — N210 Calculus in bladder: Secondary | ICD-10-CM | POA: Insufficient documentation

## 2015-08-24 DIAGNOSIS — Z01812 Encounter for preprocedural laboratory examination: Secondary | ICD-10-CM | POA: Insufficient documentation

## 2015-08-24 DIAGNOSIS — N201 Calculus of ureter: Secondary | ICD-10-CM | POA: Diagnosis not present

## 2015-08-24 LAB — CBC
HCT: 45.7 % (ref 39.0–52.0)
HEMOGLOBIN: 15.1 g/dL (ref 13.0–17.0)
MCH: 29.3 pg (ref 26.0–34.0)
MCHC: 33 g/dL (ref 30.0–36.0)
MCV: 88.6 fL (ref 78.0–100.0)
Platelets: 128 10*3/uL — ABNORMAL LOW (ref 150–400)
RBC: 5.16 MIL/uL (ref 4.22–5.81)
RDW: 12.9 % (ref 11.5–15.5)
WBC: 9 10*3/uL (ref 4.0–10.5)

## 2015-08-24 LAB — BASIC METABOLIC PANEL
ANION GAP: 12 (ref 5–15)
BUN: 26 mg/dL — ABNORMAL HIGH (ref 6–20)
CALCIUM: 10 mg/dL (ref 8.9–10.3)
CO2: 24 mmol/L (ref 22–32)
Chloride: 102 mmol/L (ref 101–111)
Creatinine, Ser: 1.38 mg/dL — ABNORMAL HIGH (ref 0.61–1.24)
GFR calc Af Amer: >60 mL/min (ref >60)
GFR calc non Af Amer: 53 mL/min — ABNORMAL LOW (ref >60)
Glucose, Bld: 156 mg/dL — ABNORMAL HIGH (ref 65–99)
POTASSIUM: 4.9 mmol/L (ref 3.5–5.1)
SODIUM: 138 mmol/L (ref 135–145)

## 2015-08-24 NOTE — Progress Notes (Signed)
bmet results faxed to dr wrenn by epic 

## 2015-08-24 NOTE — Progress Notes (Signed)
Cbc results faxed by epic to dr Jeffie Pollock

## 2015-08-25 LAB — HEMOGLOBIN A1C
HEMOGLOBIN A1C: 12.2 % — AB (ref 4.8–5.6)
MEAN PLASMA GLUCOSE: 303 mg/dL

## 2015-08-25 NOTE — Progress Notes (Signed)
Consulted Dr. Delma Post from Anesthesia about results from [facility]. Per Dr. Delma Post , patient to be referred back to his PCP for control of Diabetes. Darrel Reach, scheduler for [facility] notified of this recommendation from Anesthesia.

## 2015-08-25 NOTE — Progress Notes (Addendum)
08/15/2015-Clearance from Coll[facility] Mares, [place] note on chart.  Noted in EPIC from [facility]-09/10/2014-AAA stable.

## 2015-08-29 NOTE — H&P (Signed)
Active Problems  1. Benign prostatic hyperplasia with urinary obstruction (N40.1,N13.8)  2. Bladder calculus (N21.0)  3. Calculus of left ureter (N20.1)  4. Hypercalcemia (E83.52)  5. Nephrolithiasis (N20.0)  6. Renal cyst, acquired, left (N28.1)  History of Present Illness  [name] returns today in f/u for his history of stones.  He had a left ESWL on 07/09/13 and cleared the ureteral fragments but has had some remain in the bladder and a KUB prior to this visit shows that he has 2 bladder stones with the largest being a 66mm Barnabas Lister stone that was 61mm in 2015 and a 30mm bladder stone.   He has a possible 7-98mm stone in the left proximal ureter but no obstruction on [place].  He has stable renal cysts.   He has had some mild left flank pain.  He has had no gross hematuria.  He is voiding without difficulty.  He has ED and has used Cialis in the past but that is not an issue for him at this time.   Past Medical History  1. History of Acute Myocardial Infarction  2. History of Bladder Calculus  3. History of Calculus of both ureters (N20.1)  4. History of Calculus of left ureter (N20.1)  5. History of Calculus of right ureter (N20.1)  6. History of Calculus of ureter (N20.1)  7. History of Gastric ulcer (K25.9)  8. History of depression (Z86.59)  9. History of diabetes mellitus (Z86.39)  10. History of hypercholesterolemia (Z86.39)  11. History of hypertension (Z86.79)  12. History of Urge incontinence of urine (N39.41)  13. History of Urinary retention (R33.9)  Surgical History  1. History of CABG  2. History of Cath Stent Placement  3. History of Colonoscopic Polypectomy Via Colostomy  4. History of Cystoscopy With Removal Of Object  5. History of Cystoscopy With Ureteroscopy With Removal Of Calculus  6. History of Inguinal Hernia Repair  7. History of Renal Lithotripsy  8. History of Renal Lithotripsy  9. History of Upper Gastrointestinal Endoscopy, Simple Primary Exam  Current Meds  1. Adult Aspirin Low Strength 81 MG TBDP;  Therapy: (Recorded:15Jun2012) to Recorded  2. AmLODIPine Besylate 2.5 MG Oral Tablet;  Therapy: (Recorded:12Jul2013) to Recorded  3. Cialis 10 MG Oral Tablet;  Therapy: (Recorded:12Jul2013) to Recorded  4. Fish Oil CAPS;  Therapy: (Recorded:27Dec2010) to Recorded  5. Jentadueto 2.5-500 MG Oral Tablet;  Therapy: (Recorded:12Jul2013) to Recorded  6. Lipitor 20 MG Oral Tablet;  Therapy: (Recorded:15Jun2012) to Recorded  7. Metoprolol Tartrate 100 MG Oral Tablet;  Therapy: (Recorded:27Dec2010) to Recorded  8. Oxycodone-Acetaminophen 5-325 MG Oral Tablet; take 1 or 2 tablets q 4-6 hours prn  pain;  Therapy: [id number] to (Last Rx:02Jan2015) Ordered  9. Ramipril 10 MG Oral Capsule;  Therapy: (Recorded:27Dec2010) to Recorded  10. Zolpidem Tartrate TABS;   Therapy: (Recorded:12Jul2013) to Recorded  Allergies  1. Phenergan TABS  Family History  1. Family history of Death In The Family Father  2. Family history of Diabetes Mellitus  3. Family history of Family Health Status Number Of Children  4. Family history of Nephrolithiasis  Social History  1. Denied: History of Alcohol Use (History)  2. Caffeine Use   1 a day  3. Former tobacco use [phone])   quit in April 2014.  4. Marital History - Currently Married  5. Retired From Work  Review of [employer], constitutional, skin, eye, otolaryngeal, hematologic/lymphatic, cardiovascular, pulmonary, endocrine, musculoskeletal, gastrointestinal, neurological and psychiatric system(s) were reviewed and pertinent findings if present  are noted and are otherwise negative.  Genitourinary: no hematuria.  Gastrointestinal: flank pain.  Cardiovascular: chest pain, but no leg swelling.  Respiratory: no shortness of breath.  Musculoskeletal: joint pain.    Vitals Vital Signs [Data Includes: Last 1 Day]  Recorded: 27Jan2017 09:29AM  Weight: 170 lb  BMI Calculated: 23.71 BSA Calculated:  1.97 Blood Pressure: 115 / 71 Temperature: 97.6 F Heart Rate: 62  Physical Exam Constitutional: Well nourished and well developed . No acute distress.  Pulmonary: No respiratory distress and normal respiratory rhythm and effort.  Cardiovascular: Heart rate and rhythm are normal . No peripheral edema.    Results/Data Urine [Data Includes: Last 1 Day]   27Jan2017  COLOR YELLOW   APPEARANCE CLEAR   SPECIFIC GRAVITY 1.015   pH 5.5   GLUCOSE 3+   BILIRUBIN NEGATIVE   KETONE NEGATIVE   BLOOD NEGATIVE   PROTEIN NEGATIVE   NITRITE NEGATIVE   LEUKOCYTE ESTERASE NEGATIVE    KUB and [place] films and report reviewed.  UA reviewed.    Assessment  1. Bladder calculus (N21.0)  2. Calculus of left ureter (N20.1)   His bladder stone has grown significantly and there is a probable left proximal ureteral stone with some flank pain.   Plan Benign prostatic hyperplasia with urinary obstruction   1. UA With REFLEX; [Do Not Release]; Status:Hold For - [employer];  Requested [id number];  Bladder calculus   2. Follow-up Schedule Surgery Office  Follow-up  Status: Hold For - Appointment   Requested for: 27Jan2017   I am going to get him set up for cystoscopy with left retrograde pyelography, cystolithalopaxy, possible left ureteroscopy with holmium and left ureteral stent.  I have reviewed the risks of bleeding, infection, ureteral injury, bladder injury, need for a stent, foley or secondary procedures, thrombotic events and anesthetic complications.   Discussion/Summary  CC: Dr. Sharilyn Sites [facility]. Marland Kitchen

## 2015-08-30 ENCOUNTER — Encounter (HOSPITAL_COMMUNITY): Payer: Self-pay | Admitting: *Deleted

## 2015-08-30 ENCOUNTER — Ambulatory Visit (HOSPITAL_COMMUNITY): Payer: PRIVATE HEALTH INSURANCE | Admitting: Anesthesiology

## 2015-08-30 ENCOUNTER — Ambulatory Visit (HOSPITAL_COMMUNITY)
Admission: RE | Admit: 2015-08-30 | Discharge: 2015-08-30 | Disposition: A | Discharge status: Home / Self Care | Payer: PRIVATE HEALTH INSURANCE | Source: Ambulatory Visit | Attending: Urology | Admitting: Urology

## 2015-08-30 ENCOUNTER — Encounter (HOSPITAL_COMMUNITY)
Admission: RE | Disposition: A | Discharge status: Home / Self Care | Payer: Self-pay | Source: Ambulatory Visit | Attending: Urology

## 2015-08-30 DIAGNOSIS — N401 Enlarged prostate with lower urinary tract symptoms: Secondary | ICD-10-CM | POA: Diagnosis not present

## 2015-08-30 DIAGNOSIS — Z955 Presence of coronary angioplasty implant and graft: Secondary | ICD-10-CM | POA: Diagnosis not present

## 2015-08-30 DIAGNOSIS — E7800 Pure hypercholesterolemia, unspecified: Secondary | ICD-10-CM | POA: Diagnosis not present

## 2015-08-30 DIAGNOSIS — I252 Old myocardial infarction: Secondary | ICD-10-CM | POA: Insufficient documentation

## 2015-08-30 DIAGNOSIS — N138 Other obstructive and reflux uropathy: Secondary | ICD-10-CM | POA: Insufficient documentation

## 2015-08-30 DIAGNOSIS — Z79891 Long term (current) use of opiate analgesic: Secondary | ICD-10-CM | POA: Diagnosis not present

## 2015-08-30 DIAGNOSIS — Z841 Family history of disorders of kidney and ureter: Secondary | ICD-10-CM | POA: Insufficient documentation

## 2015-08-30 DIAGNOSIS — Z7982 Long term (current) use of aspirin: Secondary | ICD-10-CM | POA: Insufficient documentation

## 2015-08-30 DIAGNOSIS — N201 Calculus of ureter: Secondary | ICD-10-CM | POA: Insufficient documentation

## 2015-08-30 DIAGNOSIS — Z87442 Personal history of urinary calculi: Secondary | ICD-10-CM | POA: Insufficient documentation

## 2015-08-30 DIAGNOSIS — Z951 Presence of aortocoronary bypass graft: Secondary | ICD-10-CM | POA: Diagnosis not present

## 2015-08-30 DIAGNOSIS — N210 Calculus in bladder: Secondary | ICD-10-CM | POA: Diagnosis not present

## 2015-08-30 DIAGNOSIS — Z87891 Personal history of nicotine dependence: Secondary | ICD-10-CM | POA: Insufficient documentation

## 2015-08-30 DIAGNOSIS — Z7984 Long term (current) use of oral hypoglycemic drugs: Secondary | ICD-10-CM | POA: Diagnosis not present

## 2015-08-30 DIAGNOSIS — I10 Essential (primary) hypertension: Secondary | ICD-10-CM | POA: Insufficient documentation

## 2015-08-30 DIAGNOSIS — E1151 Type 2 diabetes mellitus with diabetic peripheral angiopathy without gangrene: Secondary | ICD-10-CM | POA: Insufficient documentation

## 2015-08-30 DIAGNOSIS — Z79899 Other long term (current) drug therapy: Secondary | ICD-10-CM | POA: Diagnosis not present

## 2015-08-30 DIAGNOSIS — N281 Cyst of kidney, acquired: Secondary | ICD-10-CM | POA: Diagnosis not present

## 2015-08-30 HISTORY — PX: HOLMIUM LASER APPLICATION: SHX5852

## 2015-08-30 HISTORY — PX: CYSTOSCOPY WITH RETROGRADE PYELOGRAM, URETEROSCOPY AND STENT PLACEMENT: SHX5789

## 2015-08-30 LAB — GLUCOSE, CAPILLARY
GLUCOSE-CAPILLARY: 121 mg/dL — AB (ref 65–99)
Glucose-Capillary: 155 mg/dL — ABNORMAL HIGH (ref 65–99)

## 2015-08-30 SURGERY — CYSTOURETEROSCOPY, WITH RETROGRADE PYELOGRAM AND STENT INSERTION
Anesthesia: General | Laterality: Left

## 2015-08-30 MED ORDER — SODIUM CHLORIDE 0.9% FLUSH: 3.0000 mL | INTRAVENOUS | Status: DC | PRN

## 2015-08-30 MED ORDER — BELLADONNA ALKALOIDS-OPIUM 16.2-60 MG RE SUPP
RECTAL | Status: DC | PRN
  Administered 2015-08-30: 1 via RECTAL

## 2015-08-30 MED ORDER — ONDANSETRON HCL 4 MG PO TABS: 4.0000 mg | ORAL_TABLET | Freq: Three times a day (TID) | ORAL | Status: DC | PRN

## 2015-08-30 MED ORDER — ONDANSETRON HCL 4 MG/2ML IJ SOLN
INTRAMUSCULAR | Status: AC
  Filled 2015-08-30: qty 2

## 2015-08-30 MED ORDER — ACETAMINOPHEN 325 MG PO TABS: 650.0000 mg | ORAL_TABLET | ORAL | Status: DC | PRN

## 2015-08-30 MED ORDER — FENTANYL CITRATE (PF) 100 MCG/2ML IJ SOLN
INTRAMUSCULAR | Status: AC
  Filled 2015-08-30: qty 4

## 2015-08-30 MED ORDER — PROPOFOL 10 MG/ML IV BOLUS
INTRAVENOUS | Status: DC | PRN
  Administered 2015-08-30: 170 mg via INTRAVENOUS

## 2015-08-30 MED ORDER — ARTIFICIAL TEARS OP OINT
TOPICAL_OINTMENT | OPHTHALMIC | Status: AC
  Filled 2015-08-30: qty 3.5

## 2015-08-30 MED ORDER — FENTANYL CITRATE (PF) 100 MCG/2ML IJ SOLN: 25.0000 ug | INTRAMUSCULAR | Status: DC | PRN

## 2015-08-30 MED ORDER — SODIUM CHLORIDE 0.9% FLUSH: 3.0000 mL | Freq: Two times a day (BID) | INTRAVENOUS | Status: DC

## 2015-08-30 MED ORDER — LIDOCAINE HCL (CARDIAC) 20 MG/ML IV SOLN
INTRAVENOUS | Status: DC | PRN
  Administered 2015-08-30: 80 mg via INTRAVENOUS

## 2015-08-30 MED ORDER — OXYCODONE HCL 5 MG PO TABS
5.0000 mg | ORAL_TABLET | ORAL | Status: DC | PRN
  Administered 2015-08-30 (×2): 5 mg via ORAL
  Filled 2015-08-30 (×2): qty 1

## 2015-08-30 MED ORDER — PROPOFOL 10 MG/ML IV BOLUS
INTRAVENOUS | Status: AC
Start: 2015-08-30 — End: 2015-08-30
  Filled 2015-08-30: qty 40

## 2015-08-30 MED ORDER — DEXAMETHASONE SODIUM PHOSPHATE 10 MG/ML IJ SOLN
INTRAMUSCULAR | Status: AC
  Filled 2015-08-30: qty 1

## 2015-08-30 MED ORDER — ACETAMINOPHEN 650 MG RE SUPP
650.0000 mg | RECTAL | Status: DC | PRN
  Filled 2015-08-30: qty 1

## 2015-08-30 MED ORDER — CEFAZOLIN SODIUM-DEXTROSE 2-3 GM-% IV SOLR
INTRAVENOUS | Status: AC
  Filled 2015-08-30: qty 50

## 2015-08-30 MED ORDER — BELLADONNA ALKALOIDS-OPIUM 16.2-60 MG RE SUPP
RECTAL | Status: AC
  Filled 2015-08-30: qty 1

## 2015-08-30 MED ORDER — HYDROCODONE-ACETAMINOPHEN 5-325 MG PO TABS: 1.0000 | ORAL_TABLET | Freq: Four times a day (QID) | ORAL | Status: DC | PRN

## 2015-08-30 MED ORDER — LACTATED RINGERS IV SOLN: INTRAVENOUS | Status: DC

## 2015-08-30 MED ORDER — CEFAZOLIN SODIUM-DEXTROSE 2-3 GM-% IV SOLR
2.0000 g | INTRAVENOUS | Status: AC
  Administered 2015-08-30: 2 g via INTRAVENOUS

## 2015-08-30 MED ORDER — MIDAZOLAM HCL 2 MG/2ML IJ SOLN
INTRAMUSCULAR | Status: AC
  Filled 2015-08-30: qty 2

## 2015-08-30 MED ORDER — IOHEXOL 300 MG/ML  SOLN
INTRAMUSCULAR | Status: DC | PRN
  Administered 2015-08-30: 6 mL via URETHRAL

## 2015-08-30 MED ORDER — SODIUM CHLORIDE 0.9 % IR SOLN
Status: DC | PRN
  Administered 2015-08-30: 3000 mL

## 2015-08-30 MED ORDER — SODIUM CHLORIDE 0.9 % IV SOLN: 250.0000 mL | INTRAVENOUS | Status: DC | PRN

## 2015-08-30 MED ORDER — ONDANSETRON HCL 4 MG/2ML IJ SOLN
INTRAMUSCULAR | Status: DC | PRN
  Administered 2015-08-30: 4 mg via INTRAVENOUS

## 2015-08-30 MED ORDER — 0.9 % SODIUM CHLORIDE (POUR BTL) OPTIME
TOPICAL | Status: DC | PRN
  Administered 2015-08-30: 1000 mL

## 2015-08-30 MED ORDER — FENTANYL CITRATE (PF) 100 MCG/2ML IJ SOLN
INTRAMUSCULAR | Status: DC | PRN
  Administered 2015-08-30 (×2): 50 ug via INTRAVENOUS

## 2015-08-30 MED ORDER — PHENAZOPYRIDINE HCL 200 MG PO TABS: 200.0000 mg | ORAL_TABLET | Freq: Three times a day (TID) | ORAL | Status: DC | PRN

## 2015-08-30 MED ORDER — LACTATED RINGERS IV SOLN
INTRAVENOUS | Status: DC
  Administered 2015-08-30: 1000 mL via INTRAVENOUS

## 2015-08-30 MED ORDER — MIDAZOLAM HCL 5 MG/5ML IJ SOLN
INTRAMUSCULAR | Status: DC | PRN
  Administered 2015-08-30: 2 mg via INTRAVENOUS

## 2015-08-30 SURGICAL SUPPLY — 18 items
BAG URO CATCHER STRL LF (MISCELLANEOUS) ×2 IMPLANT
BASKET DAKOTA 1.9FR 11X120 (BASKET) ×1 IMPLANT
BASKET ZERO TIP NITINOL 2.4FR (BASKET) ×1 IMPLANT
BSKT STON RTRVL ZERO TP 2.4FR (BASKET) ×1
CATH URET 5FR 28IN OPEN ENDED (CATHETERS) ×1 IMPLANT
CLOTH BEACON ORANGE TIMEOUT ST (SAFETY) ×2 IMPLANT
FIBER LASER FLEXIVA 1000 (UROLOGICAL SUPPLIES) ×1 IMPLANT
FIBER LASER FLEXIVA 200 (UROLOGICAL SUPPLIES) IMPLANT
FIBER LASER FLEXIVA 365 (UROLOGICAL SUPPLIES) IMPLANT
FIBER LASER FLEXIVA 550 (UROLOGICAL SUPPLIES) IMPLANT
FIBER LASER TRAC TIP (UROLOGICAL SUPPLIES) IMPLANT
GLOVE SURG SS PI 8.0 STRL IVOR (GLOVE) IMPLANT
GOWN STRL REUS W/TWL XL LVL3 (GOWN DISPOSABLE) ×2 IMPLANT
GUIDEWIRE STR DUAL SENSOR (WIRE) IMPLANT
MANIFOLD NEPTUNE II (INSTRUMENTS) ×2 IMPLANT
PACK CYSTO (CUSTOM PROCEDURE TRAY) ×2 IMPLANT
SHEATH ACCESS URETERAL 38CM (SHEATH) ×2 IMPLANT
TUBING CONNECTING 10 (TUBING) ×2 IMPLANT

## 2015-08-30 NOTE — Interval H&P Note (Signed)
History and Physical Interval Note:  08/30/2015 9:45 AM  [name]  has presented today for surgery, with the diagnosis of LEFT URETERAL STONE AND BLADDER STONES  The various methods of treatment have been discussed with the patient and family. After consideration of risks, benefits and other options for treatment, the patient has consented to  Procedure(s): CYSTOSCOPY LEFT RETROGRADE PYELOGRAM  LEFT   URETEROSCOPY LEFT STENT PLACEMENT,  LITHOLAPAXY (Left) HOLMIUM LASER APPLICATION (Left) as a surgical intervention .  The patient's history has been reviewed, patient examined, no change in status, stable for surgery.  I have reviewed the patient's chart and labs.  Questions were answered to the patient's satisfaction.     [name] J

## 2015-08-30 NOTE — Anesthesia Preprocedure Evaluation (Addendum)
Anesthesia Evaluation  Patient identified by MRN, date of birth, ID band Patient awake    Reviewed: Allergy & Precautions, NPO status , Patient's Chart, lab work & pertinent test results  Airway Mallampati: II  TM Distance: >3 FB Neck ROM: Full    Dental no notable dental hx.    Pulmonary Current Smoker,    Pulmonary exam normal breath sounds clear to auscultation       Cardiovascular hypertension, Pt. on medications and Pt. on home beta blockers + CAD, + Past MI, + Cardiac Stents, + CABG (2014) and + Peripheral Vascular Disease  Normal cardiovascular exam Rhythm:Regular Rate:Normal  AAA 3.5 cm on 10-19-13   Neuro/Psych Anxiety Depression negative neurological ROS     GI/Hepatic Neg liver ROS, PUD,   Endo/Other  diabetes, Type 2  Renal/GU Renal diseaseRenal calculus  negative genitourinary   Musculoskeletal negative musculoskeletal ROS (+)   Abdominal   Peds negative pediatric ROS (+)  Hematology negative hematology ROS (+) Thrombocytopenia Plts. 128 K on 08-24-15   Anesthesia Other Findings   Reproductive/Obstetrics negative OB ROS                      Anesthesia Physical Anesthesia Plan  ASA: III  Anesthesia Plan: General   Post-op Pain Management:    Induction: Intravenous  Airway Management Planned: LMA  Additional Equipment:   Intra-op Plan:   Post-operative Plan: Extubation in OR  Informed Consent: I have reviewed the patients History and Physical, chart, labs and discussed the procedure including the risks, benefits and alternatives for the proposed anesthesia with the patient or authorized representative who has indicated his/her understanding and acceptance.   Dental advisory given  Plan Discussed with: CRNA  Anesthesia Plan Comments:         Anesthesia Quick Evaluation

## 2015-08-30 NOTE — Anesthesia Procedure Notes (Signed)
Procedure Name: LMA Insertion Date/Time: 08/30/2015 10:29 AM Performed by: Wanita Chamberlain Pre-anesthesia Checklist: Patient identified, Emergency Drugs available, Suction available, Patient being monitored and Timeout performed Patient Re-evaluated:Patient Re-evaluated prior to inductionOxygen Delivery Method: Circle system utilized Preoxygenation: Pre-oxygenation with 100% oxygen Intubation Type: IV induction Ventilation: Mask ventilation without difficulty LMA: LMA inserted LMA Size: 4.0 Number of attempts: 1 Placement Confirmation: positive ETCO2 and breath sounds checked- equal and bilateral Tube secured with: Tape Dental Injury: Teeth and Oropharynx as per pre-operative assessment

## 2015-08-30 NOTE — Discharge Instructions (Signed)

## 2015-08-30 NOTE — Anesthesia Postprocedure Evaluation (Signed)
Anesthesia Post Note  Patient: [name]  Procedure(s) Performed: Procedure(s) (LRB): CYSTOSCOPY LEFT RETROGRADE PYELOGRAM  LEFT   URETEROSCOPY,  LITHOLAPAXY (Left) HOLMIUM LASER APPLICATION (Left)  Patient location during evaluation: PACU Anesthesia Type: General Level of consciousness: awake and alert Pain management: pain level controlled Vital Signs Assessment: post-procedure vital signs reviewed and stable Respiratory status: spontaneous breathing, nonlabored ventilation, respiratory function stable and patient connected to nasal cannula oxygen Cardiovascular status: blood pressure returned to baseline and stable Postop Assessment: no signs of nausea or vomiting Anesthetic complications: no    Last Vitals:  Filed Vitals:   08/30/15 1227 08/30/15 1357  BP: 114/68 130/79  Pulse: 61   Temp: 36.4 C   Resp: 17 16    Last Pain:  Filed Vitals:   08/30/15 1358  PainSc: 3                  [name]

## 2015-08-30 NOTE — Interval H&P Note (Signed)
History and Physical Interval Note:  08/30/2015 9:46 AM  [name]  has presented today for surgery, with the diagnosis of LEFT URETERAL STONE AND BLADDER STONES  The various methods of treatment have been discussed with the patient and family. After consideration of risks, benefits and other options for treatment, the patient has consented to  Procedure(s): CYSTOSCOPY LEFT RETROGRADE PYELOGRAM  LEFT   URETEROSCOPY LEFT STENT PLACEMENT,  LITHOLAPAXY (Left) HOLMIUM LASER APPLICATION (Left) as a surgical intervention .  The patient's history has been reviewed, patient examined, no change in status, stable for surgery.  I have reviewed the patient's chart and labs.  Questions were answered to the patient's satisfaction.     Gurveer Colucci J

## 2015-08-30 NOTE — Transfer of Care (Signed)
Immediate Anesthesia Transfer of Care Note  Patient: [name]  Procedure(s) Performed: Procedure(s): CYSTOSCOPY LEFT RETROGRADE PYELOGRAM  LEFT   URETEROSCOPY,  LITHOLAPAXY (Left) HOLMIUM LASER APPLICATION (Left)  Patient Location: PACU  Anesthesia Type:General  Level of Consciousness: awake, alert  and oriented  Airway & Oxygen Therapy: Patient Spontanous Breathing and Patient connected to face mask oxygen  Post-op Assessment: Report given to RN and Post -op Vital signs reviewed and stable  Post vital signs: Reviewed and stable  Last Vitals:  Filed Vitals:   08/30/15 0754  BP: 111/64  Pulse: 73  Temp: 36.6 C  Resp: 16    Complications: No apparent anesthesia complications

## 2015-08-30 NOTE — Brief Op Note (Signed)
08/30/2015  11:13 AM  PATIENT:  [name]  65 y.o. male  PRE-OPERATIVE DIAGNOSIS:  LEFT URETERAL STONE AND BLADDER STONES 1.5cm  POST-OPERATIVE DIAGNOSIS:  LEFT URETERAL STONE AND BLADDER STONES 1.5cm  PROCEDURE:  Procedure(s): CYSTOSCOPY LEFT RETROGRADE PYELOGRAM  LEFT   URETEROSCOPY,  LITHOLAPAXY (Left) HOLMIUM LASER APPLICATION (Left)  SURGEON:  Surgeon(s) and Role:    * Irine Seal, MD - Primary  PHYSICIAN ASSISTANT:   ASSISTANTS: none   ANESTHESIA:   general  EBL:     BLOOD ADMINISTERED:none  DRAINS: none   LOCAL MEDICATIONS USED:  NONE  SPECIMEN:  Source of Specimen:  bladder and ureteral stones.  DISPOSITION OF SPECIMEN:  to family  COUNTS:  YES  TOURNIQUET:  * No tourniquets in log *  DICTATION: .Other Dictation: Dictation Number [id number]  PLAN OF CARE: Discharge to home after PACU  PATIENT DISPOSITION:  PACU - hemodynamically stable.   Delay start of Pharmacological VTE agent (>24hrs) due to surgical blood loss or risk of bleeding: not applicable

## 2015-08-30 NOTE — Op Note (Signed)
[name] Kitchen  [name], [name] NO.:  [id number]  MEDICAL RECORD NO.:  [id number]  LOCATION:  [facility]                         FACILITY:  [facility]  PHYSICIAN:  Marshall Cork. Jeffie Pollock, M.D.    DATE OF BIRTH:  12-26-1950  DATE OF PROCEDURE:  08/30/2015 DATE OF DISCHARGE:                              OPERATIVE REPORT   PROCEDURE: 1. Cystoscopy with left retrograde pyelogram with interpretation. 2. Left ureteroscopic stone extraction. 3. Cystolitholapaxy of 1.5-2 cm stone.  PREOPERATIVE DIAGNOSIS:  An 8 mm left proximal ureteral stone and 1.5-2 cm bladder stone.  POSTOPERATIVE DIAGNOSIS:  Left distal ureteral stone with bladder stone.  SURGEON:  Marshall Cork. Jeffie Pollock, MD  ANESTHESIA:  General.  SPECIMEN:  Stone fragments.  DRAINS:  None.  BLOOD LOSS:  None.  COMPLICATIONS:  None.  INDICATIONS:  [name] is a 65 year old white male with history of urolithiasis who has an enlarging bladder stone that was approximately 1.5-2 cm on recent imaging.  He also has an 8 mm ureteral stone that was in the proximal ureter and associated with intermittent symptoms.  It was felt that ureteroscopy and cystolitholapaxy were indicated.  FINDINGS OF PROCEDURE:  The patient was taken to the operating room where general anesthetic was induced.  He was given Ancef.  He was placed in a lithotomy position.  He was fitted with PAS hose.  His perineum and genitalia were prepped with Betadine solution.  He was draped in usual sterile fashion.  Cystoscopy was performed using 23-French scope and 30-degree lens. Examination revealed a normal urethra.  The prostatic urethra was approximately 2 cm in length with bilobar hyperplasia and slightly high bladder neck, but only mild obstruction.  Examination of bladder revealed mild trabeculation.  Ureteral orifices were in the normal anatomic position, effluxing clear urine.  New tumors were seen.  There was approximately 1.5 to 2 cm Jackstone calculus at the base of  the bladder along with an approximately 8-10 mm additional stone.  Once thorough inspection had been performed, a 5-French open-end catheter was used to perform a left retrograde pyelogram.  The retrograde pyelogram demonstrated a filling defect in the distal ureter consistent with stone.  There is a bone island in the sacroiliac area that was extra ureteral on retrograde.  The internal collecting system was delicate.  There was a small filling defect in the lower pole consistent with a known lower pole stone.  Once retrograde pyelography was performed, a guidewire was passed to the kidney.  The cystoscope was removed and a 12-French introducer sheath inner core was used to dilate the distal ureter, this was very easy to do.  Once the ureter had been dilated, the 6.5-French semi-rigid ureteroscope was passed alongside the wire up to the level of the stone.  The stone was initially engaged with a [place] basket and was pulled to the urethral meatus, but then we switched to a ZeroTip basket complete removal.  No fragmentation was needed and the stone was removed without significant resistance.  After the stone had been removed, the wire was removed and the cystoscope was reinserted.  The bladder stone was engaged with a 1000 micron laser fiber, set on 2 watts  and 50 hertz and fragmented readily, although it was a hard stone.  Once the stone was sufficiently fragmented, the bladder was evacuated free of all stones and dust.  Once the bladder was clear, final inspection revealed some minimal bleeding from the left ureteral orifice, but no bladder wall injuries. It was not felt, a stent was indicated.  The bladder was drained.  B and O suppository was placed.  The patient was taken down from lithotomy position.  His anesthetic was reversed.  He was moved to recovery room in stable condition.  There were no complications.     Marshall Cork. Jeffie Pollock, M.D.     JJW/MEDQ  D:  08/30/2015  T:   08/30/2015  Job:  [id number]

## 2015-09-16 ENCOUNTER — Ambulatory Visit (INDEPENDENT_AMBULATORY_CARE_PROVIDER_SITE_OTHER): Payer: PRIVATE HEALTH INSURANCE | Admitting: Urology

## 2015-09-16 DIAGNOSIS — N210 Calculus in bladder: Secondary | ICD-10-CM

## 2016-01-26 ENCOUNTER — Ambulatory Visit (INDEPENDENT_AMBULATORY_CARE_PROVIDER_SITE_OTHER): Payer: PRIVATE HEALTH INSURANCE | Admitting: Cardiology

## 2016-01-26 ENCOUNTER — Other Ambulatory Visit: Payer: Self-pay | Admitting: Family Medicine

## 2016-01-26 ENCOUNTER — Encounter: Payer: Self-pay | Admitting: Cardiology

## 2016-01-26 VITALS — BP 112/62 | HR 67 | Ht 71.5 in | Wt 174.0 lb

## 2016-01-26 DIAGNOSIS — I714 Abdominal aortic aneurysm, without rupture, unspecified: Secondary | ICD-10-CM

## 2016-01-26 DIAGNOSIS — E782 Mixed hyperlipidemia: Secondary | ICD-10-CM

## 2016-01-26 DIAGNOSIS — I10 Essential (primary) hypertension: Secondary | ICD-10-CM | POA: Diagnosis not present

## 2016-01-26 DIAGNOSIS — I2510 Atherosclerotic heart disease of native coronary artery without angina pectoris: Secondary | ICD-10-CM | POA: Diagnosis not present

## 2016-01-26 DIAGNOSIS — R0602 Shortness of breath: Secondary | ICD-10-CM | POA: Diagnosis not present

## 2016-01-26 MED ORDER — ATORVASTATIN CALCIUM 40 MG PO TABS: 40.0000 mg | ORAL_TABLET | Freq: Every day | ORAL | 3 refills | Status: DC

## 2016-01-26 MED ORDER — HYDROCHLOROTHIAZIDE 25 MG PO TABS: ORAL_TABLET | ORAL | 3 refills | Status: DC

## 2016-01-26 MED ORDER — METOPROLOL TARTRATE 25 MG PO TABS: 25.0000 mg | ORAL_TABLET | Freq: Two times a day (BID) | ORAL | 3 refills | Status: DC

## 2016-01-26 NOTE — Progress Notes (Signed)
Clinical Summary [name] is a 65 y.o.male seen today for follow up of the following medical problems   1. CAD  - prior 3 vessel CABG in 09/2012 (LIMA to LAD, SVG to ramus, SVG to OM), history of PCI prior to that. 09/2012 LVEF 60-65% by echo   - denies any chest pain. Reports some recent DOE, example cutting grass with push mower which is new. Used to be able to complete entire yard, now has to stop and take breaths due to SOB. No chest pain. No LE edema, orthonpnea, or PND - compliant with meds.   2. HTN  - not checking regularly at home - compliant with meds   3. HL  - compliant with statin    4. AAA  - [place] 09/2013 3.5 x 4.5, slightly larger than prior study a year prior (3.3x3.9) - denies any symptoms since las tvisit  5. Former tobacco - tobacco x 40 years.  - occasional cough, wheezing.    SH: trip to [facility] coming up in November with his family Past Medical History:  Diagnosis Date  . AAA (abdominal aortic aneurysm) (HCC)    3.3 cm in 2012  . ABDOMINAL AORTIC ANEURYSM 11/07/2009   Diameter of 3.3 cm by CT in 12/2010   . Anxiety   . ASCVD (arteriosclerotic cardiovascular disease)    LAD stent in 1999; RCA bare-metal stent in 12/2007, LAD stent patent, 50-60% proximal Cx lesion treated medically; 09/2012: CABG x3, LIMA to LAD, Sequential SVG to ramus intermediate and OM [name] with EVH via right thigh  . Coronary artery disease   . Depression   . Diabetes mellitus   . DIABETES MELLITUS, TYPE II 08/09/2010   no insulin    . ED (erectile dysfunction)   . Gastric ulcer   . Hepatic steatosis   . Hyperlipidemia   . HYPERLIPIDEMIA 08/09/2010   Lipid profile in 07/2010:144, 124, 34, 86; 01/2012:93, 82, 34, 43   . Insomnia   . MI (myocardial infarction) ([place])   . Nephrolithiasis   . NEPHROLITHIASIS, RECURRENT 08/09/2010   Obstruction of the ureter on CT scan of the abdomen resulting in right hydronephrosis in 12/2010; bladder outlet obstruction prior to that.  CT  also revealed atherosclerotic changes in the abdominal arterial vessels, hepatic granuloma and calcification of the prostate.   . S/P CABG x 3 10/07/2012   LIMA to LAD, Sequential SVG to ramus intermediate and OM [name] with EVH via right thigh  . THROMBOCYTOPENIA 08/09/2010   Platelets 88-114,000 over the past 4 years; prior evaluation by Dr. Tressie Stalker without a specific etiology identified.  01/2012: Normal CBC ex platelets-120   . Thrombocytopenia ([place])    Chronic; evaluated by Dr. Tressie Stalker in 2007  . Tobacco abuse      Allergies  Allergen Reactions  . Meperidine Hcl Other (See Comments)    Was mixed with phenergan and turned blood vessels red.   . Promethazine Hcl Other (See Comments)    Was mixed with the Demerol and caused veins to turn red.      Current Outpatient Prescriptions  Medication Sig Dispense Refill  . aspirin EC 81 MG tablet Take 81 mg by mouth daily.    Marland Kitchen atorvastatin (LIPITOR) 20 MG tablet Take 20 mg by mouth daily.     . fish oil-omega-3 fatty acids 1000 MG capsule Take 1 capsule by mouth 2 (two) times daily.     Marland Kitchen glipiZIDE (GLUCOTROL) 5 MG tablet Take 5 mg by  mouth daily.  2  . hydrochlorothiazide (HYDRODIURIL) 25 MG tablet TAKE 1 TABLET (25 MG TOTAL) BY MOUTH DAILY. 90 tablet 3  . HYDROcodone-acetaminophen (NORCO) 5-325 MG tablet Take 1 tablet by mouth every 6 (six) hours as needed for moderate pain. 15 tablet 0  . metFORMIN (GLUCOPHAGE) 1000 MG tablet Take 1,000 mg by mouth 2 (two) times daily.  2  . metoprolol tartrate (LOPRESSOR) 25 MG tablet Take 1 tablet (25 mg total) by mouth 2 (two) times daily. 180 tablet 3  . ondansetron (ZOFRAN) 4 MG tablet Take 1 tablet (4 mg total) by mouth every 8 (eight) hours as needed for nausea or vomiting. 15 tablet 0  . phenazopyridine (PYRIDIUM) 200 MG tablet Take 1 tablet (200 mg total) by mouth 3 (three) times daily as needed for pain. 10 tablet 0  . pioglitazone-metformin (ACTOPLUS MET) 15-850 MG tablet Take 1 tablet by mouth  2 (two) times daily.  5  . ramipril (ALTACE) 10 MG capsule Take 2 capsules (20 mg total) by mouth daily with breakfast. 180 capsule 3  . tadalafil (CIALIS) 10 MG tablet Take 10 mg by mouth daily as needed for erectile dysfunction.      No current facility-administered medications for this visit.      Past Surgical History:  Procedure Laterality Date  . [place], 2009  . CORONARY ARTERY BYPASS GRAFT N/A 10/07/2012   Procedure: CORONARY ARTERY BYPASS GRAFTING (CABG);  Surgeon: Rexene Alberts, MD;  Location: [place];  Service: Open Heart Surgery;  Laterality: N/A;  . CYSTOSCOPY WITH RETROGRADE PYELOGRAM, URETEROSCOPY AND STENT PLACEMENT Left 08/30/2015   Procedure: CYSTOSCOPY LEFT RETROGRADE PYELOGRAM  LEFT   URETEROSCOPY,  LITHOLAPAXY;  Surgeon: Irine Seal, MD;  [facility];  Service: Urology;  Laterality: Left;  . ESOPHAGOGASTRODUODENOSCOPY  04/04/2012   Procedure: ESOPHAGOGASTRODUODENOSCOPY (EGD);  Surgeon: Rogene Houston, MD;  [facility];  Service: Endoscopy;  Laterality: N/A;  830  . ESOPHAGOGASTRODUODENOSCOPY  07/25/2012   Procedure: ESOPHAGOGASTRODUODENOSCOPY (EGD);  Surgeon: Rogene Houston, MD;  [facility];  Service: Endoscopy;  Laterality: N/A;  830  . HOLMIUM LASER APPLICATION Left 10/13/2438   Procedure: HOLMIUM LASER APPLICATION;  Surgeon: Irine Seal, MD;  [facility];  Service: Urology;  Laterality: Left;  . INGUINAL HERNIA REPAIR    . INTRAOPERATIVE TRANSESOPHAGEAL ECHOCARDIOGRAM N/A 10/07/2012   Procedure: INTRAOPERATIVE TRANSESOPHAGEAL ECHOCARDIOGRAM;  Surgeon: Rexene Alberts, MD;  Location: [place];  Service: Open Heart Surgery;  Laterality: N/A;  . LEFT HEART CATHETERIZATION WITH CORONARY ANGIOGRAM N/A 10/03/2012   Procedure: LEFT HEART CATHETERIZATION WITH CORONARY ANGIOGRAM;  Surgeon: Minus Breeding, MD;  Location: [facility];  Service: Cardiovascular;  Laterality: N/A;  . LITHOTRIPSY       Allergies    Allergen Reactions  . Meperidine Hcl Other (See Comments)    Was mixed with phenergan and turned blood vessels red.   . Promethazine Hcl Other (See Comments)    Was mixed with the Demerol and caused veins to turn red.       Family History  Problem Relation Age of Onset  . Ovarian cancer Sister      Social History [name] reports that he quit smoking about 2 years ago. His smoking use included Cigarettes. He has a 20.00 pack-year smoking history. He has never used smokeless tobacco. [name] reports that he does not drink alcohol.   Review of Systems CONSTITUTIONAL: No weight  loss, fever, chills, weakness or fatigue.  HEENT: Eyes: No visual loss, blurred vision, double vision or yellow sclerae.No hearing loss, sneezing, congestion, runny nose or sore throat.  SKIN: No rash or itching.  CARDIOVASCULAR: per HPI RESPIRATORY: per HPI GASTROINTESTINAL: No anorexia, nausea, vomiting or diarrhea. No abdominal pain or blood.  GENITOURINARY: No burning on urination, no polyuria NEUROLOGICAL: No headache, dizziness, syncope, paralysis, ataxia, numbness or tingling in the extremities. No change in bowel or bladder control.  MUSCULOSKELETAL: No muscle, back pain, joint pain or stiffness.  LYMPHATICS: No enlarged nodes. No history of splenectomy.  PSYCHIATRIC: No history of depression or anxiety.  ENDOCRINOLOGIC: No reports of sweating, cold or heat intolerance. No polyuria or polydipsia.  Marland Kitchen   Physical Examination Vitals:   01/26/16 1100  BP: 112/62  Pulse: 67   Vitals:   01/26/16 1100  Weight: 174 lb (78.9 kg)  Height: 5' 11.5" (1.816 m)    Gen: resting comfortably, no acute distress HEENT: no scleral icterus, pupils equal round and reactive, no palptable cervical adenopathy,  CV: RRR, no m/r/g, no jvd Resp: Clear to auscultation bilaterally GI: abdomen is soft, non-tender, non-distended, normal bowel sounds, no hepatosplenomegaly MSK: extremities are warm, no edema.   Skin: warm, no rash Neuro:  no focal deficits Psych: appropriate affect   Diagnostic Studies 09/2012 Echo: LVEF 55-60%, no WMAs,   09/2012 AAA [place]: 3.3 x 3.9 cm, stable from prior CT  08/2013 AAA [place] Infrarenal abdominal aortic aneurysm measuring 3.5 x 4.5 cm.  Previous examination of 10/06/2012 demonstrated the abdominal aorta  measuring 3.3 x 3.9 cm.  08/2014 AAA US IMPRESSION: 4.4 cm infrarenal abdominal aortic aneurysm. Recommend followup by [place] in 3 years. This recommendation follows ACR consensus guidelines: White Paper of the ACR Incidental Findings Committee II on Vascular Findings. J Am Coll Radiol 2013; 54:656-812   Assessment and Plan  1. CAD  - recent increase in DOE. Given his CAD history we will order exericise nuclear stress to evaluate for recurrent ischemic coronary disease - continue current meds  2. HTN  - at goal, he will continue current meds  3. HL  - in setting of CAD and DM2 he should be on more potent statin, we will increase atorvastatin to '40mg'$  daily - request labs from pcp  4. AAA  - repeat surveillance [place] next year.    F/u 6 months   Arnoldo Lenis, M.D.

## 2016-01-26 NOTE — Patient Instructions (Signed)
Medication Instructions:  Increase atorvastatin 40 mg daily   Labwork: I will request labs from pcp  Testing/Procedures: Exercise nuclear stress test   Follow-Up: Your physician wants you to follow-up in: 6 months  You will receive a reminder letter in the mail two months in advance. If you don't receive a letter, please call our office to schedule the follow-up appointment.   Any Other Special Instructions Will Be Listed Below (If Applicable).     If you need a refill on your cardiac medications before your next appointment, please call your pharmacy.

## 2016-02-02 ENCOUNTER — Encounter (HOSPITAL_COMMUNITY): Payer: PRIVATE HEALTH INSURANCE

## 2016-02-02 ENCOUNTER — Inpatient Hospital Stay (HOSPITAL_COMMUNITY): Admission: RE | Admit: 2016-02-02 | Payer: PRIVATE HEALTH INSURANCE | Source: Ambulatory Visit

## 2016-02-02 ENCOUNTER — Encounter (HOSPITAL_COMMUNITY): Admission: RE | Admit: 2016-02-02 | Payer: PRIVATE HEALTH INSURANCE | Source: Ambulatory Visit

## 2016-03-23 ENCOUNTER — Ambulatory Visit: Payer: PRIVATE HEALTH INSURANCE | Admitting: Urology

## 2016-10-30 ENCOUNTER — Ambulatory Visit (INDEPENDENT_AMBULATORY_CARE_PROVIDER_SITE_OTHER): Payer: PRIVATE HEALTH INSURANCE | Admitting: Cardiology

## 2016-10-30 ENCOUNTER — Encounter: Payer: Self-pay | Admitting: Cardiology

## 2016-10-30 VITALS — BP 112/66 | HR 88 | Ht 71.0 in | Wt 175.0 lb

## 2016-10-30 DIAGNOSIS — I10 Essential (primary) hypertension: Secondary | ICD-10-CM | POA: Diagnosis not present

## 2016-10-30 DIAGNOSIS — I714 Abdominal aortic aneurysm, without rupture, unspecified: Secondary | ICD-10-CM

## 2016-10-30 DIAGNOSIS — I2510 Atherosclerotic heart disease of native coronary artery without angina pectoris: Secondary | ICD-10-CM

## 2016-10-30 DIAGNOSIS — E782 Mixed hyperlipidemia: Secondary | ICD-10-CM

## 2016-10-30 MED ORDER — RAMIPRIL 10 MG PO CAPS
20.0000 mg | ORAL_CAPSULE | Freq: Every day | ORAL | 3 refills | Status: DC
Start: 2016-10-30 — End: 2017-11-11

## 2016-10-30 MED ORDER — HYDROCHLOROTHIAZIDE 12.5 MG PO CAPS: 12.5000 mg | ORAL_CAPSULE | Freq: Every day | ORAL | 3 refills | Status: DC

## 2016-10-30 MED ORDER — ATORVASTATIN CALCIUM 40 MG PO TABS: 40.0000 mg | ORAL_TABLET | Freq: Every day | ORAL | 3 refills | Status: DC

## 2016-10-30 NOTE — Progress Notes (Signed)
Clinical Summary [name] is a 66 y.o.male seen today for follow up of the following medical problems   1. CAD  - prior 3 vessel CABG in 09/2012 (LIMA to LAD, SVG to ramus, SVG to OM), history of PCI prior to that. 09/2012 LVEF 60-65% by echo   - last visit he reported symptoms of DOE, a stress was ordered but he never completed.  - no recent chest pain. Denies any recent SOB/DOE - compliant with meds.    2. HTN   - compliant with meds   3. HL  - compliant with statin  - has labs with pcp later this month  4. AAA  - [place] 09/2013 3.5 x 4.5, slightly larger than prior study a year prior (3.3x3.9) - denies any symptoms since las tvisit - for repeat study in 2019  5. Former tobacco - tobacco x 40 years.  - occasional cough, wheezing.    SH:  Retired Engineer, structural.   Past Medical History:  Diagnosis Date  . AAA (abdominal aortic aneurysm) (HCC)    3.3 cm in 2012  . ABDOMINAL AORTIC ANEURYSM 11/07/2009   Diameter of 3.3 cm by CT in 12/2010   . Anxiety   . ASCVD (arteriosclerotic cardiovascular disease)    LAD stent in 1999; RCA bare-metal stent in 12/2007, LAD stent patent, 50-60% proximal Cx lesion treated medically; 09/2012: CABG x3, LIMA to LAD, Sequential SVG to ramus intermediate and OM Cyanna Neace with EVH via right thigh  . Coronary artery disease   . Depression   . Diabetes mellitus   . DIABETES MELLITUS, TYPE II 08/09/2010   no insulin    . ED (erectile dysfunction)   . Gastric ulcer   . Hepatic steatosis   . Hyperlipidemia   . HYPERLIPIDEMIA 08/09/2010   Lipid profile in 07/2010:144, 124, 34, 86; 01/2012:93, 82, 34, 43   . Insomnia   . MI (myocardial infarction)   . Nephrolithiasis   . NEPHROLITHIASIS, RECURRENT 08/09/2010   Obstruction of the ureter on CT scan of the abdomen resulting in right hydronephrosis in 12/2010; bladder outlet obstruction prior to that.  CT also revealed atherosclerotic changes in the abdominal arterial vessels, hepatic  granuloma and calcification of the prostate.   . S/P CABG x 3 10/07/2012   LIMA to LAD, Sequential SVG to ramus intermediate and OM Concettina Leth with EVH via right thigh  . THROMBOCYTOPENIA 08/09/2010   Platelets 88-114,000 over the past 4 years; prior evaluation by Dr. Tressie Stalker without a specific etiology identified.  01/2012: Normal CBC ex platelets-120   . Thrombocytopenia ([place])    Chronic; evaluated by Dr. Tressie Stalker in 2007  . Tobacco abuse      Allergies  Allergen Reactions  . Meperidine Hcl Other (See Comments)    Was mixed with phenergan and turned blood vessels red.   . Promethazine Hcl Other (See Comments)    Was mixed with the Demerol and caused veins to turn red.      Current Outpatient Prescriptions  Medication Sig Dispense Refill  . aspirin EC 81 MG tablet Take 81 mg by mouth daily.    Marland Kitchen atorvastatin (LIPITOR) 40 MG tablet Take 1 tablet (40 mg total) by mouth daily. 90 tablet 3  . fish oil-omega-3 fatty acids 1000 MG capsule Take 1 capsule by mouth 2 (two) times daily.     Marland Kitchen glipiZIDE (GLUCOTROL) 5 MG tablet Take 5 mg by mouth daily.  2  . hydrochlorothiazide (HYDRODIURIL) 25 MG tablet TAKE  1 TABLET (25 MG TOTAL) BY MOUTH DAILY. 90 tablet 3  . HYDROcodone-acetaminophen (NORCO) 5-325 MG tablet Take 1 tablet by mouth every 6 (six) hours as needed for moderate pain. 15 tablet 0  . metFORMIN (GLUCOPHAGE) 1000 MG tablet Take 1,000 mg by mouth 2 (two) times daily.  2  . metoprolol tartrate (LOPRESSOR) 25 MG tablet Take 1 tablet (25 mg total) by mouth 2 (two) times daily. 180 tablet 3  . ondansetron (ZOFRAN) 4 MG tablet Take 1 tablet (4 mg total) by mouth every 8 (eight) hours as needed for nausea or vomiting. 15 tablet 0  . phenazopyridine (PYRIDIUM) 200 MG tablet Take 1 tablet (200 mg total) by mouth 3 (three) times daily as needed for pain. 10 tablet 0  . pioglitazone-metformin (ACTOPLUS MET) 15-850 MG tablet Take 1 tablet by mouth 2 (two) times daily.  5  . ramipril (ALTACE) 10 MG  capsule Take 2 capsules (20 mg total) by mouth daily with breakfast. 180 capsule 3  . tadalafil (CIALIS) 10 MG tablet Take 10 mg by mouth daily as needed for erectile dysfunction.      No current facility-administered medications for this visit.      Past Surgical History:  Procedure Laterality Date  . [place], 2009  . CORONARY ARTERY BYPASS GRAFT N/A 10/07/2012   Procedure: CORONARY ARTERY BYPASS GRAFTING (CABG);  Surgeon: Rexene Alberts, MD;  Location: [place];  Service: Open Heart Surgery;  Laterality: N/A;  . CYSTOSCOPY WITH RETROGRADE PYELOGRAM, URETEROSCOPY AND STENT PLACEMENT Left 08/30/2015   Procedure: CYSTOSCOPY LEFT RETROGRADE PYELOGRAM  LEFT   URETEROSCOPY,  LITHOLAPAXY;  Surgeon: Irine Seal, MD;  [facility];  Service: Urology;  Laterality: Left;  . ESOPHAGOGASTRODUODENOSCOPY  04/04/2012   Procedure: ESOPHAGOGASTRODUODENOSCOPY (EGD);  Surgeon: Rogene Houston, MD;  [facility];  Service: Endoscopy;  Laterality: N/A;  830  . ESOPHAGOGASTRODUODENOSCOPY  07/25/2012   Procedure: ESOPHAGOGASTRODUODENOSCOPY (EGD);  Surgeon: Rogene Houston, MD;  [facility];  Service: Endoscopy;  Laterality: N/A;  830  . HOLMIUM LASER APPLICATION Left 08/15/863   Procedure: HOLMIUM LASER APPLICATION;  Surgeon: Irine Seal, MD;  [facility];  Service: Urology;  Laterality: Left;  . INGUINAL HERNIA REPAIR    . INTRAOPERATIVE TRANSESOPHAGEAL ECHOCARDIOGRAM N/A 10/07/2012   Procedure: INTRAOPERATIVE TRANSESOPHAGEAL ECHOCARDIOGRAM;  Surgeon: Rexene Alberts, MD;  Location: [place];  Service: Open Heart Surgery;  Laterality: N/A;  . LEFT HEART CATHETERIZATION WITH CORONARY ANGIOGRAM N/A 10/03/2012   Procedure: LEFT HEART CATHETERIZATION WITH CORONARY ANGIOGRAM;  Surgeon: Minus Breeding, MD;  Location[facility];  Service: Cardiovascular;  Laterality: N/A;  . LITHOTRIPSY       Allergies  Allergen Reactions  . Meperidine Hcl Other (See  Comments)    Was mixed with phenergan and turned blood vessels red.   . Promethazine Hcl Other (See Comments)    Was mixed with the Demerol and caused veins to turn red.       Family History  Problem Relation Age of Onset  . Ovarian cancer Sister      Social History Mr. Middlesworth reports that he quit smoking about 3 years ago. His smoking use included Cigarettes. He has a 20.00 pack-year smoking history. He has never used smokeless tobacco. Mr. Weakland reports that he does not drink alcohol.   Review of Systems CONSTITUTIONAL: No weight loss, fever, chills, weakness or fatigue.  HEENT: Eyes: No visual loss, blurred  vision, double vision or yellow sclerae.No hearing loss, sneezing, congestion, runny nose or sore throat.  SKIN: No rash or itching.  CARDIOVASCULAR: per hpi RESPIRATORY: No shortness of breath, cough or sputum.  GASTROINTESTINAL: No anorexia, nausea, vomiting or diarrhea. No abdominal pain or blood.  GENITOURINARY: No burning on urination, no polyuria NEUROLOGICAL:+dizziness with standing MUSCULOSKELETAL: No muscle, back pain, joint pain or stiffness.  LYMPHATICS: No enlarged nodes. No history of splenectomy.  PSYCHIATRIC: No history of depression or anxiety.  ENDOCRINOLOGIC: No reports of sweating, cold or heat intolerance. No polyuria or polydipsia.  Marland Kitchen   Physical Examination Vitals:   10/30/16 1604  BP: 112/66  Pulse: 88   Vitals:   10/30/16 1604  Weight: 175 lb (79.4 kg)  Height: '5\' 11"'$  (1.803 m)    Gen: resting comfortably, no acute distress HEENT: no scleral icterus, pupils equal round and reactive, no palptable cervical adenopathy,  CV: RRR, no m/r/g, no jvd Resp: Clear to auscultation bilaterally GI: abdomen is soft, non-tender, non-distended, normal bowel sounds, no hepatosplenomegaly MSK: extremities are warm, no edema.  Skin: warm, no rash Neuro:  no focal deficits Psych: appropriate affect   Diagnostic Studies 09/2012 Echo: LVEF 55-60%, no  WMAs,   09/2012 AAA [place]: 3.3 x 3.9 cm, stable from prior CT  08/2013 AAA [place] Infrarenal abdominal aortic aneurysm measuring 3.5 x 4.5 cm.  Previous examination of 10/06/2012 demonstrated the abdominal aorta  measuring 3.3 x 3.9 cm.  08/2014 AAA US IMPRESSION: 4.4 cm infrarenal abdominal aortic aneurysm. Recommend followup by [place] in 3 years. This recommendation follows ACR consensus guidelines: White Paper of the ACR Incidental Findings Committee II on Vascular Findings. J Am Coll Radiol 2013; 73:567-014    Assessment and Plan   1. CAD  - no recent symptoms. EKG in clinic shows SR, chronic ST/T changes - continue current meds  2. HTN  - at goal. Some dizziness with standing. Orthostatics negative, however given symptoms will try lowering HCTZ to 12.'5mg'$  daily.   3. HL  - continue statin, request labs from pcp  4. AAA  - repeat US next year.    F/u 1 year      Arnoldo Lenis, M.D.,

## 2016-10-30 NOTE — Patient Instructions (Signed)
Medication Instructions:  DECREASE hydrochlorothiazide TO 12.5 MG DAILY   Labwork: I WILL REQUEST A COPY OF LABS FROM PCP.   Testing/Procedures: NONE  Follow-Up: Your physician wants you to follow-up in: 1 YEAR.  You will receive a reminder letter in the mail two months in advance. If you don't receive a letter, please call our office to schedule the follow-up appointment.   Any Other Special Instructions Will Be Listed Below (If Applicable).     If you need a refill on your cardiac medications before your next appointment, please call your pharmacy.

## 2017-08-15 ENCOUNTER — Other Ambulatory Visit (HOSPITAL_COMMUNITY): Payer: Self-pay | Admitting: Adult Health

## 2017-08-15 DIAGNOSIS — N201 Calculus of ureter: Secondary | ICD-10-CM

## 2017-08-15 DIAGNOSIS — N132 Hydronephrosis with renal and ureteral calculous obstruction: Secondary | ICD-10-CM

## 2017-08-21 ENCOUNTER — Ambulatory Visit (HOSPITAL_COMMUNITY)
Admission: RE | Admit: 2017-08-21 | Discharge: 2017-08-21 | Disposition: A | Discharge status: Home / Self Care | Payer: PRIVATE HEALTH INSURANCE | Source: Ambulatory Visit | Attending: Adult Health | Admitting: Adult Health

## 2017-08-21 ENCOUNTER — Other Ambulatory Visit (HOSPITAL_COMMUNITY): Payer: Self-pay | Admitting: Adult Health

## 2017-08-21 DIAGNOSIS — N132 Hydronephrosis with renal and ureteral calculous obstruction: Secondary | ICD-10-CM

## 2017-08-21 DIAGNOSIS — N201 Calculus of ureter: Secondary | ICD-10-CM

## 2017-08-21 DIAGNOSIS — N281 Cyst of kidney, acquired: Secondary | ICD-10-CM | POA: Diagnosis not present

## 2017-08-21 DIAGNOSIS — N200 Calculus of kidney: Secondary | ICD-10-CM | POA: Insufficient documentation

## 2017-09-27 ENCOUNTER — Ambulatory Visit (INDEPENDENT_AMBULATORY_CARE_PROVIDER_SITE_OTHER): Payer: PRIVATE HEALTH INSURANCE | Admitting: Urology

## 2017-09-27 ENCOUNTER — Other Ambulatory Visit (HOSPITAL_COMMUNITY)
Admission: RE | Admit: 2017-09-27 | Discharge: 2017-09-27 | Disposition: A | Discharge status: Home / Self Care | Payer: PRIVATE HEALTH INSURANCE | Source: Other Acute Inpatient Hospital | Attending: Urology | Admitting: Urology

## 2017-09-27 DIAGNOSIS — N132 Hydronephrosis with renal and ureteral calculous obstruction: Secondary | ICD-10-CM

## 2017-09-27 DIAGNOSIS — R3121 Asymptomatic microscopic hematuria: Secondary | ICD-10-CM

## 2017-09-27 DIAGNOSIS — N200 Calculus of kidney: Secondary | ICD-10-CM | POA: Diagnosis not present

## 2017-09-27 LAB — URINALYSIS, COMPLETE (UACMP) WITH MICROSCOPIC
BILIRUBIN URINE: NEGATIVE
Bacteria, UA: NONE SEEN
Glucose, UA: NEGATIVE mg/dL
KETONES UR: NEGATIVE mg/dL
LEUKOCYTES UA: NEGATIVE
Nitrite: NEGATIVE
PROTEIN: NEGATIVE mg/dL
Specific Gravity, Urine: 1.017 (ref 1.005–1.030)
Squamous Epithelial / LPF: NONE SEEN
pH: 5 (ref 5.0–8.0)

## 2017-11-11 ENCOUNTER — Other Ambulatory Visit: Payer: Self-pay | Admitting: Cardiology

## 2017-11-18 ENCOUNTER — Other Ambulatory Visit: Payer: Self-pay | Admitting: Cardiology

## 2017-12-19 ENCOUNTER — Ambulatory Visit: Payer: PRIVATE HEALTH INSURANCE | Admitting: Cardiology

## 2017-12-24 ENCOUNTER — Ambulatory Visit (INDEPENDENT_AMBULATORY_CARE_PROVIDER_SITE_OTHER): Payer: PRIVATE HEALTH INSURANCE | Admitting: Cardiology

## 2017-12-24 ENCOUNTER — Encounter: Payer: Self-pay | Admitting: Cardiology

## 2017-12-24 VITALS — BP 110/62 | HR 72 | Ht 71.0 in | Wt 178.0 lb

## 2017-12-24 DIAGNOSIS — I10 Essential (primary) hypertension: Secondary | ICD-10-CM

## 2017-12-24 DIAGNOSIS — I25810 Atherosclerosis of coronary artery bypass graft(s) without angina pectoris: Secondary | ICD-10-CM

## 2017-12-24 DIAGNOSIS — E782 Mixed hyperlipidemia: Secondary | ICD-10-CM | POA: Diagnosis not present

## 2017-12-24 DIAGNOSIS — I493 Ventricular premature depolarization: Secondary | ICD-10-CM

## 2017-12-24 MED ORDER — TADALAFIL 20 MG PO TABS: ORAL_TABLET | ORAL | 6 refills | Status: DC

## 2017-12-24 NOTE — Progress Notes (Signed)
Clinical Summary Mr. Linnemann is a 67 y.o.male seen today for follow up of the following medical problems   1. CAD  - prior 3 vessel CABG in 09/2012 (LIMA to LAD, SVG to ramus, SVG to OM), history of PCI prior to that. 09/2012 LVEF 60-65% by echo    - no recent chest pain. No SOB/DOE   2. HTN  - still with some orthsotatic symptoms but mild. - compliant with meds   3. HL  - compliant with statin  -most recent labs done by pcp    4. AAA  - [place] 09/2013 3.5 x 4.5, slightly larger than prior study a year prior (3.3x3.9) - for repeat study in 2019  - no recent symptoms.     6. PVCs - 2-3 cups of coffee, no sodas, no tea, no energy, no EtOH.  -mild infrequent palpitations since last visit.     SH:  Retired Engineer, structural.  On disabilty due to cardaic condition and chronic joint pains.      Past Medical History:  Diagnosis Date  . AAA (abdominal aortic aneurysm) (HCC)    3.3 cm in 2012  . ABDOMINAL AORTIC ANEURYSM 11/07/2009   Diameter of 3.3 cm by CT in 12/2010   . Anxiety   . ASCVD (arteriosclerotic cardiovascular disease)    LAD stent in 1999; RCA bare-metal stent in 12/2007, LAD stent patent, 50-60% proximal Cx lesion treated medically; 09/2012: CABG x3, LIMA to LAD, Sequential SVG to ramus intermediate and OM Daisean Brodhead with EVH via right thigh  . Coronary artery disease   . Depression   . Diabetes mellitus   . DIABETES MELLITUS, TYPE II 08/09/2010   no insulin    . ED (erectile dysfunction)   . Gastric ulcer   . Hepatic steatosis   . Hyperlipidemia   . HYPERLIPIDEMIA 08/09/2010   Lipid profile in 07/2010:144, 124, 34, 86; 01/2012:93, 82, 34, 43   . Insomnia   . MI (myocardial infarction) ([place])   . Nephrolithiasis   . NEPHROLITHIASIS, RECURRENT 08/09/2010   Obstruction of the ureter on CT scan of the abdomen resulting in right hydronephrosis in 12/2010; bladder outlet obstruction prior to that.  CT also revealed atherosclerotic changes in the abdominal  arterial vessels, hepatic granuloma and calcification of the prostate.   . S/P CABG x 3 10/07/2012   LIMA to LAD, Sequential SVG to ramus intermediate and OM Azalia Neuberger with EVH via right thigh  . THROMBOCYTOPENIA 08/09/2010   Platelets 88-114,000 over the past 4 years; prior evaluation by Dr. Tressie Stalker without a specific etiology identified.  01/2012: Normal CBC ex platelets-120   . Thrombocytopenia ([place])    Chronic; evaluated by Dr. Tressie Stalker in 2007  . Tobacco abuse      Allergies  Allergen Reactions  . Meperidine Hcl Other (See Comments)    Was mixed with phenergan and turned blood vessels red.   . Promethazine Hcl Other (See Comments)    Was mixed with the Demerol and caused veins to turn red.      Current Outpatient Medications  Medication Sig Dispense Refill  . aspirin EC 81 MG tablet Take 81 mg by mouth daily.    Marland Kitchen atorvastatin (LIPITOR) 40 MG tablet TAKE 1 TABLET BY MOUTH EVERY DAY 90 tablet 3  . fish oil-omega-3 fatty acids 1000 MG capsule Take 1 capsule by mouth 2 (two) times daily.     . hydrochlorothiazide (MICROZIDE) 12.5 MG capsule TAKE ONE CAPSULE BY MOUTH EVERY DAY 90  capsule 3  . metoprolol tartrate (LOPRESSOR) 25 MG tablet Take 1 tablet (25 mg total) by mouth 2 (two) times daily. 180 tablet 3  . pioglitazone-metformin (ACTOPLUS MET) 15-850 MG tablet Take 1 tablet by mouth 2 (two) times daily.  5  . ramipril (ALTACE) 10 MG capsule TAKE 2 CAPSULES BY MOUTH EVERY DAY WITH BREAKFAST 180 capsule 3  . tadalafil (CIALIS) 10 MG tablet Take 10 mg by mouth daily as needed for erectile dysfunction.      No current facility-administered medications for this visit.      Past Surgical History:  Procedure Laterality Date  . [place], 2009  . CORONARY ARTERY BYPASS GRAFT N/A 10/07/2012   Procedure: CORONARY ARTERY BYPASS GRAFTING (CABG);  Surgeon: Rexene Alberts, MD;  Location: [place];  Service: Open Heart Surgery;  Laterality: N/A;  .  CYSTOSCOPY WITH RETROGRADE PYELOGRAM, URETEROSCOPY AND STENT PLACEMENT Left 08/30/2015   Procedure: CYSTOSCOPY LEFT RETROGRADE PYELOGRAM  LEFT   URETEROSCOPY,  LITHOLAPAXY;  Surgeon: Irine Seal, MD;  Location: [facility];  Service: Urology;  Laterality: Left;  . ESOPHAGOGASTRODUODENOSCOPY  04/04/2012   Procedure: ESOPHAGOGASTRODUODENOSCOPY (EGD);  Surgeon: Rogene Houston, MD;  [facility];  Service: Endoscopy;  Laterality: N/A;  830  . ESOPHAGOGASTRODUODENOSCOPY  07/25/2012   Procedure: ESOPHAGOGASTRODUODENOSCOPY (EGD);  Surgeon: Rogene Houston, MD;  [facility];  Service: Endoscopy;  Laterality: N/A;  830  . HOLMIUM LASER APPLICATION Left 6/60/6301   Procedure: HOLMIUM LASER APPLICATION;  Surgeon: Irine Seal, MD;  Location: [facility];  Service: Urology;  Laterality: Left;  . INGUINAL HERNIA REPAIR    . INTRAOPERATIVE TRANSESOPHAGEAL ECHOCARDIOGRAM N/A 10/07/2012   Procedure: INTRAOPERATIVE TRANSESOPHAGEAL ECHOCARDIOGRAM;  Surgeon: Rexene Alberts, MD;  Location: [place];  Service: Open Heart Surgery;  Laterality: N/A;  . LEFT HEART CATHETERIZATION WITH CORONARY ANGIOGRAM N/A 10/03/2012   Procedure: LEFT HEART CATHETERIZATION WITH CORONARY ANGIOGRAM;  Surgeon: Minus Breeding, MD;  Location: [facility];  Service: Cardiovascular;  Laterality: N/A;  . LITHOTRIPSY       Allergies  Allergen Reactions  . Meperidine Hcl Other (See Comments)    Was mixed with phenergan and turned blood vessels red.   . Promethazine Hcl Other (See Comments)    Was mixed with the Demerol and caused veins to turn red.       Family History  Problem Relation Age of Onset  . Ovarian cancer Sister      Social History Mr. Laskaris reports that he quit smoking about 4 years ago. His smoking use included cigarettes. He has a 20.00 pack-year smoking history. He has never used smokeless tobacco. Mr. Belshe reports that he does not drink alcohol.   Review of Systems CONSTITUTIONAL: No weight loss, fever,  chills, weakness or fatigue.  HEENT: Eyes: No visual loss, blurred vision, double vision or yellow sclerae.No hearing loss, sneezing, congestion, runny nose or sore throat.  SKIN: No rash or itching.  CARDIOVASCULAR: per hpi RESPIRATORY: No shortness of breath, cough or sputum.  GASTROINTESTINAL: No anorexia, nausea, vomiting or diarrhea. No abdominal pain or blood.  GENITOURINARY: No burning on urination, no polyuria NEUROLOGICAL: No headache, dizziness, syncope, paralysis, ataxia, numbness or tingling in the extremities. No change in bowel or bladder control.  MUSCULOSKELETAL: No muscle, back pain, joint pain or stiffness.  LYMPHATICS: No enlarged nodes. No history of splenectomy.  PSYCHIATRIC: No history of depression or anxiety.  ENDOCRINOLOGIC: No reports of sweating,  cold or heat intolerance. No polyuria or polydipsia.  Marland Kitchen   Physical Examination Vitals:   12/24/17 1350  BP: 110/62  Pulse: 72  SpO2: 95%   Vitals:   12/24/17 1350  Weight: 178 lb (80.7 kg)  Height: _0  (1.803 m)    Gen: resting comfortably, no acute distress HEENT: no scleral icterus, pupils equal round and reactive, no palptable cervical adenopathy,  CV: RRR, no m/r/g, no jvd Resp: Clear to auscultation bilaterally GI: abdomen is soft, non-tender, non-distended, normal bowel sounds, no hepatosplenomegaly MSK: extremities are warm, no edema.  Skin: warm, no rash Neuro:  no focal deficits Psych: appropriate affect   Diagnostic Studies 09/2012 Echo: LVEF 55-60%, no WMAs,   09/2012 AAA [place]: 3.3 x 3.9 cm, stable from prior CT  08/2013 AAA [place] Infrarenal abdominal aortic aneurysm measuring 3.5 x 4.5 cm.  Previous examination of 10/06/2012 demonstrated the abdominal aorta  measuring 3.3 x 3.9 cm.  08/2014 AAA US IMPRESSION: 4.4 cm infrarenal abdominal aortic aneurysm. Recommend followup by [place] in 3 years. This recommendation follows ACR consensus guidelines: White Paper of the ACR Incidental Findings  Committee II on Vascular Findings. J Am Coll Radiol 2013; 77:373-668      Assessment and Plan  1. CAD  - no recent symptoms, continue current meds - EKG today shows SR, chronic ST/T changes and PVCs  2. HTN  - at goal, continue current meds  3. Hyperlipidemia - he will continue statin, request labs from pcp  4. PVCs - note on EKG, no ischemic symptmos. 5 years since LV function evaluation, will order an echo  4. AAA  - we will repeat US at next f/u.    F/u 1 year        Arnoldo Lenis, M.D.

## 2017-12-24 NOTE — Patient Instructions (Signed)
Medication Instructions:  Stop HCTZ  Labwork: NONE  Testing/Procedures: Your physician has requested that you have an echocardiogram. Echocardiography is a painless test that uses sound waves to create images of your heart. It provides your doctor with information about the size and shape of your heart and how well your heart's chambers and valves are working. This procedure takes approximately one hour. There are no restrictions for this procedure. (July)     Follow-Up: Your physician wants you to follow-up in: 1 YEAR.  You will receive a reminder letter in the mail two months in advance. If you don't receive a letter, please call our office to schedule the follow-up appointment.   Any Other Special Instructions Will Be Listed Below (If Applicable).     If you need a refill on your cardiac medications before your next appointment, please call your pharmacy.

## 2018-01-01 ENCOUNTER — Encounter: Payer: Self-pay | Admitting: Cardiology

## 2018-02-13 DIAGNOSIS — R69 Illness, unspecified: Secondary | ICD-10-CM | POA: Diagnosis not present

## 2018-02-17 ENCOUNTER — Telehealth: Payer: Self-pay | Admitting: Orthopaedic Surgery

## 2018-02-17 NOTE — Telephone Encounter (Signed)
Patient's wife called to get an appointment with Dr. Luna Glasgow for heel pain. She stated he had been walking around with pain for a couple or more weeks now. I asked if he had seen his PCP and she stated no. I suggested he may want to see his PCP and see if he needs to be seen here or a podiatrist. If he needs to be seen here we will be glad to set him up an appointment. She thanked me and hung up.

## 2018-02-20 ENCOUNTER — Encounter: Payer: Self-pay | Admitting: Orthopedic Surgery

## 2018-02-20 DIAGNOSIS — I2510 Atherosclerotic heart disease of native coronary artery without angina pectoris: Secondary | ICD-10-CM | POA: Diagnosis not present

## 2018-02-20 DIAGNOSIS — E1151 Type 2 diabetes mellitus with diabetic peripheral angiopathy without gangrene: Secondary | ICD-10-CM | POA: Diagnosis not present

## 2018-02-20 DIAGNOSIS — N202 Calculus of kidney with calculus of ureter: Secondary | ICD-10-CM | POA: Diagnosis not present

## 2018-02-20 DIAGNOSIS — E782 Mixed hyperlipidemia: Secondary | ICD-10-CM | POA: Diagnosis not present

## 2018-02-20 DIAGNOSIS — D696 Thrombocytopenia, unspecified: Secondary | ICD-10-CM | POA: Diagnosis not present

## 2018-02-20 DIAGNOSIS — Z6824 Body mass index (BMI) 24.0-24.9, adult: Secondary | ICD-10-CM | POA: Diagnosis not present

## 2018-02-20 DIAGNOSIS — Z0001 Encounter for general adult medical examination with abnormal findings: Secondary | ICD-10-CM | POA: Diagnosis not present

## 2018-02-20 DIAGNOSIS — M722 Plantar fascial fibromatosis: Secondary | ICD-10-CM | POA: Diagnosis not present

## 2018-02-20 DIAGNOSIS — N400 Enlarged prostate without lower urinary tract symptoms: Secondary | ICD-10-CM | POA: Diagnosis not present

## 2018-02-20 DIAGNOSIS — N529 Male erectile dysfunction, unspecified: Secondary | ICD-10-CM | POA: Diagnosis not present

## 2018-02-20 DIAGNOSIS — Z1389 Encounter for screening for other disorder: Secondary | ICD-10-CM | POA: Diagnosis not present

## 2018-02-20 DIAGNOSIS — I10 Essential (primary) hypertension: Secondary | ICD-10-CM | POA: Diagnosis not present

## 2018-02-20 DIAGNOSIS — R7309 Other abnormal glucose: Secondary | ICD-10-CM | POA: Diagnosis not present

## 2018-02-24 DIAGNOSIS — M722 Plantar fascial fibromatosis: Secondary | ICD-10-CM | POA: Diagnosis not present

## 2018-02-24 DIAGNOSIS — N529 Male erectile dysfunction, unspecified: Secondary | ICD-10-CM | POA: Diagnosis not present

## 2018-02-24 DIAGNOSIS — Z6824 Body mass index (BMI) 24.0-24.9, adult: Secondary | ICD-10-CM | POA: Diagnosis not present

## 2018-02-24 DIAGNOSIS — E1151 Type 2 diabetes mellitus with diabetic peripheral angiopathy without gangrene: Secondary | ICD-10-CM | POA: Diagnosis not present

## 2018-02-24 DIAGNOSIS — I10 Essential (primary) hypertension: Secondary | ICD-10-CM | POA: Diagnosis not present

## 2018-02-24 DIAGNOSIS — D696 Thrombocytopenia, unspecified: Secondary | ICD-10-CM | POA: Diagnosis not present

## 2018-02-24 DIAGNOSIS — Z0001 Encounter for general adult medical examination with abnormal findings: Secondary | ICD-10-CM | POA: Diagnosis not present

## 2018-02-24 DIAGNOSIS — N202 Calculus of kidney with calculus of ureter: Secondary | ICD-10-CM | POA: Diagnosis not present

## 2018-02-24 DIAGNOSIS — N400 Enlarged prostate without lower urinary tract symptoms: Secondary | ICD-10-CM | POA: Diagnosis not present

## 2018-02-24 DIAGNOSIS — I2510 Atherosclerotic heart disease of native coronary artery without angina pectoris: Secondary | ICD-10-CM | POA: Diagnosis not present

## 2018-03-27 ENCOUNTER — Encounter: Payer: Self-pay | Admitting: Orthopaedic Surgery

## 2018-03-27 ENCOUNTER — Ambulatory Visit (INDEPENDENT_AMBULATORY_CARE_PROVIDER_SITE_OTHER): Payer: Medicare HMO

## 2018-03-27 ENCOUNTER — Ambulatory Visit: Payer: Medicare HMO | Admitting: Orthopaedic Surgery

## 2018-03-27 VITALS — BP 150/75 | HR 56 | Ht 71.0 in | Wt 177.0 lb

## 2018-03-27 DIAGNOSIS — M79672 Pain in left foot: Secondary | ICD-10-CM

## 2018-03-27 DIAGNOSIS — G8929 Other chronic pain: Secondary | ICD-10-CM

## 2018-03-27 NOTE — Progress Notes (Signed)
Subjective:    Patient ID: [name], male    DOB: 08/31/1950, 67 y.o.   MRN: [id number]  HPI He has had pain of the left heel for about two to three months.  He has no trauma.  He has pain first thing in the morning and then it gets better.  As the day goes on he has some pain but more pain after resting a while and then walking.  He has no redness.  He has taken Tylenol with no change.  He has no change in shoe wear.   Review of Systems  Constitutional: Positive for activity change.  Musculoskeletal: Positive for arthralgias.  Psychiatric/Behavioral: The patient is nervous/anxious.   All other systems reviewed and are negative.  For Review of Systems, all other systems reviewed and are negative.  The following is a summary of the past history medically, past history surgically, known current medicines, social history and family history.  This information is gathered electronically by the computer from prior information and documentation.  I review this each visit and have found including this information at this point in the chart is beneficial and informative.   Past Medical History:  Diagnosis Date  . AAA (abdominal aortic aneurysm) (HCC)    3.3 cm in 2012  . ABDOMINAL AORTIC ANEURYSM 11/07/2009   Diameter of 3.3 cm by CT in 12/2010   . Anxiety   . ASCVD (arteriosclerotic cardiovascular disease)    LAD stent in 1999; RCA bare-metal stent in 12/2007, LAD stent patent, 50-60% proximal Cx lesion treated medically; 09/2012: CABG x3, LIMA to LAD, Sequential SVG to ramus intermediate and OM branch with EVH via right thigh  . Coronary artery disease   . Depression   . Diabetes mellitus   . DIABETES MELLITUS, TYPE II 08/09/2010   no insulin    . ED (erectile dysfunction)   . Gastric ulcer   . Hepatic steatosis   . Hyperlipidemia   . HYPERLIPIDEMIA 08/09/2010   Lipid profile in 07/2010:144, 124, 34, 86; 01/2012:93, 82, 34, 43   . Insomnia   . MI (myocardial infarction) ([place])   .  Nephrolithiasis   . NEPHROLITHIASIS, RECURRENT 08/09/2010   Obstruction of the ureter on CT scan of the abdomen resulting in right hydronephrosis in 12/2010; bladder outlet obstruction prior to that.  CT also revealed atherosclerotic changes in the abdominal arterial vessels, hepatic granuloma and calcification of the prostate.   . S/P CABG x 3 10/07/2012   LIMA to LAD, Sequential SVG to ramus intermediate and OM branch with EVH via right thigh  . THROMBOCYTOPENIA 08/09/2010   Platelets 88-114,000 over the past 4 years; prior evaluation by Dr. Tressie Stalker without a specific etiology identified.  01/2012: Normal CBC ex platelets-120   . Thrombocytopenia ([place])    Chronic; evaluated by Dr. Tressie Stalker in 2007  . Tobacco abuse     Past Surgical History:  Procedure Laterality Date  . [place], 2009  . CORONARY ARTERY BYPASS GRAFT N/A 10/07/2012   Procedure: CORONARY ARTERY BYPASS GRAFTING (CABG);  Surgeon: Rexene Alberts, MD;  Location: [place];  Service: Open Heart Surgery;  Laterality: N/A;  . CYSTOSCOPY WITH RETROGRADE PYELOGRAM, URETEROSCOPY AND STENT PLACEMENT Left 08/30/2015   Procedure: CYSTOSCOPY LEFT RETROGRADE PYELOGRAM  LEFT   URETEROSCOPY,  LITHOLAPAXY;  Surgeon: Irine Seal, MD;  [facility];  Service: Urology;  Laterality: Left;  . ESOPHAGOGASTRODUODENOSCOPY  04/04/2012   Procedure: ESOPHAGOGASTRODUODENOSCOPY (EGD);  Surgeon: Rogene Houston, MD;  [facility];  Service: Endoscopy;  Laterality: N/A;  830  . ESOPHAGOGASTRODUODENOSCOPY  07/25/2012   Procedure: ESOPHAGOGASTRODUODENOSCOPY (EGD);  Surgeon: Rogene Houston, MD;  [facility];  Service: Endoscopy;  Laterality: N/A;  830  . HOLMIUM LASER APPLICATION Left 6/38/7564   Procedure: HOLMIUM LASER APPLICATION;  Surgeon: Irine Seal, MD;  [facility];  Service: Urology;  Laterality: Left;  . INGUINAL HERNIA REPAIR    . INTRAOPERATIVE TRANSESOPHAGEAL ECHOCARDIOGRAM N/A 10/07/2012    Procedure: INTRAOPERATIVE TRANSESOPHAGEAL ECHOCARDIOGRAM;  Surgeon: Rexene Alberts, MD;  Location: [place];  Service: Open Heart Surgery;  Laterality: N/A;  . LEFT HEART CATHETERIZATION WITH CORONARY ANGIOGRAM N/A 10/03/2012   Procedure: LEFT HEART CATHETERIZATION WITH CORONARY ANGIOGRAM;  Surgeon: Minus Breeding, MD;  Location: [facility];  Service: Cardiovascular;  Laterality: N/A;  . LITHOTRIPSY      Current Outpatient Medications on File Prior to Visit  Medication Sig Dispense Refill  . aspirin EC 81 MG tablet Take 81 mg by mouth daily.    Marland Kitchen atorvastatin (LIPITOR) 40 MG tablet TAKE 1 TABLET BY MOUTH EVERY DAY 90 tablet 3  . fish oil-omega-3 fatty acids 1000 MG capsule Take 1 capsule by mouth 2 (two) times daily.     . metoprolol tartrate (LOPRESSOR) 25 MG tablet Take 1 tablet (25 mg total) by mouth 2 (two) times daily. 180 tablet 3  . pioglitazone-metformin (ACTOPLUS MET) 15-850 MG tablet Take 1 tablet by mouth 2 (two) times daily.  5  . ramipril (ALTACE) 10 MG capsule TAKE 2 CAPSULES BY MOUTH EVERY DAY WITH BREAKFAST 180 capsule 3  . tadalafil (CIALIS) 20 MG tablet Take 1 tablet as directed 30 min prior to intercourse. 30 tablet 6  . tamsulosin (FLOMAX) 0.4 MG CAPS capsule Take 0.4 mg by mouth.     No current facility-administered medications on file prior to visit.     Social History   Socioeconomic History  . Marital status: Married    Spouse name: Not on file  . Number of children: Not on file  . Years of education: Not on file  . Highest education level: Not on file  Occupational History  . Not on file  Social Needs  . Financial resource strain: Not on file  . Food insecurity:    Worry: Not on file    Inability: Not on file  . Transportation needs:    Medical: Not on file    Non-medical: Not on file  Tobacco Use  . Smoking status: Current Some Day Smoker    Packs/day: 0.50    Years: 40.00    Pack years: 20.00    Types: Cigarettes    Last attempt to quit: 07/09/2013     Years since quitting: 4.7  . Smokeless tobacco: Never Used  . Tobacco comment: 6-7 cigs. daily   Substance and Sexual Activity  . Alcohol use: No    Alcohol/week: 0.0 standard drinks  . Drug use: No  . Sexual activity: Not on file  Lifestyle  . Physical activity:    Days per week: Not on file    Minutes per session: Not on file  . Stress: Not on file  Relationships  . Social connections:    Talks on phone: Not on file    Gets together: Not on file    Attends religious service: Not on file    Active member of club or organization: Not on file  Attends meetings of clubs or organizations: Not on file    Relationship status: Not on file  . Intimate partner violence:    Fear of current or ex partner: Not on file    Emotionally abused: Not on file    Physically abused: Not on file    Forced sexual activity: Not on file  Other Topics Concern  . Not on file  Social History Narrative   No regular exercise    Family History  Problem Relation Age of Onset  . Ovarian cancer Sister     BP (!) 150/75   Pulse (!) 56   Ht '5\' 11"'$  (1.803 m)   Wt 177 lb (80.3 kg)   BMI 24.69 kg/m   Body mass index is 24.69 kg/m.     Objective:   Physical Exam  Constitutional: He is oriented to person, place, and time. He appears well-developed and well-nourished.  HENT:  Head: Normocephalic and atraumatic.  Eyes: Pupils are equal, round, and reactive to light. Conjunctivae and EOM are normal.  Neck: Normal range of motion. Neck supple.  Cardiovascular: Normal rate, regular rhythm and intact distal pulses.  Pulmonary/Chest: Effort normal.  Abdominal: Soft.  Musculoskeletal:       Left foot: There is tenderness.       Feet:  Neurological: He is alert and oriented to person, place, and time. He has normal reflexes. He displays normal reflexes. No cranial nerve deficit. He exhibits normal muscle tone. Coordination normal.  Skin: Skin is warm and dry.  Psychiatric: He has a normal mood  and affect. His behavior is normal. Judgment and thought content normal.     X-rays were done of the left foot, reported separately.    Assessment & Plan:   Encounter Diagnosis  Name Primary?  . Chronic heel pain, left Yes   I have told him about stretching exercises to do for the heel and foot.  I have recommended ice and Advil or Aleve  I will see him in three weeks.  Call if any problem.  Precautions discussed.   Electronically Signed Sanjuana Kava, MD 9/26/20198:56 AM

## 2018-04-12 DIAGNOSIS — R69 Illness, unspecified: Secondary | ICD-10-CM | POA: Diagnosis not present

## 2018-04-17 ENCOUNTER — Encounter: Payer: Self-pay | Admitting: Orthopaedic Surgery

## 2018-04-17 ENCOUNTER — Ambulatory Visit: Payer: Medicare HMO | Admitting: Orthopaedic Surgery

## 2018-04-17 VITALS — BP 126/72 | HR 67 | Ht 71.0 in | Wt 179.0 lb

## 2018-04-17 DIAGNOSIS — M79672 Pain in left foot: Secondary | ICD-10-CM | POA: Diagnosis not present

## 2018-04-17 DIAGNOSIS — G8929 Other chronic pain: Secondary | ICD-10-CM

## 2018-04-17 NOTE — Progress Notes (Signed)
Patient [name], male DOB:02/03/1951, 67 y.o. WLS:[id number]  Chief Complaint  Patient presents with  . Foot Pain    left heel     HPI  [name] is a 67 y.o. male who has chronic heel pain on the left.  He has done the stretching and tried NSAIDs and ice.  He still hurts.  He has no redness or new trauma.  He would like an injection today.  He plans to go to [employer] in the next few weeks.   Body mass index is 24.97 kg/m.  ROS  Review of Systems  Constitutional: Positive for activity change.  Musculoskeletal: Positive for arthralgias.  Psychiatric/Behavioral: The patient is nervous/anxious.   All other systems reviewed and are negative.   All other systems reviewed and are negative.  The following is a summary of the past history medically, past history surgically, known current medicines, social history and family history.  This information is gathered electronically by the computer from prior information and documentation.  I review this each visit and have found including this information at this point in the chart is beneficial and informative.    Past Medical History:  Diagnosis Date  . AAA (abdominal aortic aneurysm) (HCC)    3.3 cm in 2012  . ABDOMINAL AORTIC ANEURYSM 11/07/2009   Diameter of 3.3 cm by CT in 12/2010   . Anxiety   . ASCVD (arteriosclerotic cardiovascular disease)    LAD stent in 1999; RCA bare-metal stent in 12/2007, LAD stent patent, 50-60% proximal Cx lesion treated medically; 09/2012: CABG x3, LIMA to LAD, Sequential SVG to ramus intermediate and OM branch with EVH via right thigh  . Coronary artery disease   . Depression   . Diabetes mellitus   . DIABETES MELLITUS, TYPE II 08/09/2010   no insulin    . ED (erectile dysfunction)   . Gastric ulcer   . Hepatic steatosis   . Hyperlipidemia   . HYPERLIPIDEMIA 08/09/2010   Lipid profile in 07/2010:144, 124, 34, 86; 01/2012:93, 82, 34, 43   . Insomnia   . MI (myocardial infarction) ([place])    . Nephrolithiasis   . NEPHROLITHIASIS, RECURRENT 08/09/2010   Obstruction of the ureter on CT scan of the abdomen resulting in right hydronephrosis in 12/2010; bladder outlet obstruction prior to that.  CT also revealed atherosclerotic changes in the abdominal arterial vessels, hepatic granuloma and calcification of the prostate.   . S/P CABG x 3 10/07/2012   LIMA to LAD, Sequential SVG to ramus intermediate and OM branch with EVH via right thigh  . THROMBOCYTOPENIA 08/09/2010   Platelets 88-114,000 over the past 4 years; prior evaluation by Dr. Tressie Stalker without a specific etiology identified.  01/2012: Normal CBC ex platelets-120   . Thrombocytopenia ([place])    Chronic; evaluated by Dr. Tressie Stalker in 2007  . Tobacco abuse     Past Surgical History:  Procedure Laterality Date  . [place], 2009  . CORONARY ARTERY BYPASS GRAFT N/A 10/07/2012   Procedure: CORONARY ARTERY BYPASS GRAFTING (CABG);  Surgeon: Rexene Alberts, MD;  Location: [facility];  Service: Open Heart Surgery;  Laterality: N/A;  . CYSTOSCOPY WITH RETROGRADE PYELOGRAM, URETEROSCOPY AND STENT PLACEMENT Left 08/30/2015   Procedure: CYSTOSCOPY LEFT RETROGRADE PYELOGRAM  LEFT   URETEROSCOPY,  LITHOLAPAXY;  Surgeon: Irine Seal, MD;  [facility];  Service: Urology;  Laterality: Left;  . ESOPHAGOGASTRODUODENOSCOPY  04/04/2012   Procedure: ESOPHAGOGASTRODUODENOSCOPY (EGD);  Surgeon: Rogene Houston, MD;  [facility];  Service: Endoscopy;  Laterality: N/A;  830  . ESOPHAGOGASTRODUODENOSCOPY  07/25/2012   Procedure: ESOPHAGOGASTRODUODENOSCOPY (EGD);  Surgeon: Rogene Houston, MD;  [facility];  Service: Endoscopy;  Laterality: N/A;  830  . HOLMIUM LASER APPLICATION Left 2/48/2500   Procedure: HOLMIUM LASER APPLICATION;  Surgeon: Irine Seal, MD;  [facility];  Service: Urology;  Laterality: Left;  . INGUINAL HERNIA REPAIR    . INTRAOPERATIVE TRANSESOPHAGEAL ECHOCARDIOGRAM N/A  10/07/2012   Procedure: INTRAOPERATIVE TRANSESOPHAGEAL ECHOCARDIOGRAM;  Surgeon: Rexene Alberts, MD;  Location: [facility];  Service: Open Heart Surgery;  Laterality: N/A;  . LEFT HEART CATHETERIZATION WITH CORONARY ANGIOGRAM N/A 10/03/2012   Procedure: LEFT HEART CATHETERIZATION WITH CORONARY ANGIOGRAM;  Surgeon: Minus Breeding, MD;  Location: [facility];  Service: Cardiovascular;  Laterality: N/A;  . LITHOTRIPSY      Family History  Problem Relation Age of Onset  . Ovarian cancer Sister     Social History Social History   Tobacco Use  . Smoking status: Current Some Day Smoker    Packs/day: 0.50    Years: 40.00    Pack years: 20.00    Types: Cigarettes    Last attempt to quit: 07/09/2013    Years since quitting: 4.7  . Smokeless tobacco: Never Used  . Tobacco comment: 6-7 cigs. daily   Substance Use Topics  . Alcohol use: No    Alcohol/week: 0.0 standard drinks  . Drug use: No    Allergies  Allergen Reactions  . Meperidine Hcl Other (See Comments)    Was mixed with phenergan and turned blood vessels red.   . Promethazine Hcl Other (See Comments)    Was mixed with the Demerol and caused veins to turn red.     Current Outpatient Medications  Medication Sig Dispense Refill  . aspirin EC 81 MG tablet Take 81 mg by mouth daily.    Marland Kitchen atorvastatin (LIPITOR) 40 MG tablet TAKE 1 TABLET BY MOUTH EVERY DAY 90 tablet 3  . fish oil-omega-3 fatty acids 1000 MG capsule Take 1 capsule by mouth 2 (two) times daily.     . metoprolol tartrate (LOPRESSOR) 25 MG tablet Take 1 tablet (25 mg total) by mouth 2 (two) times daily. 180 tablet 3  . pioglitazone-metformin (ACTOPLUS MET) 15-850 MG tablet Take 1 tablet by mouth 2 (two) times daily.  5  . ramipril (ALTACE) 10 MG capsule TAKE 2 CAPSULES BY MOUTH EVERY DAY WITH BREAKFAST 180 capsule 3  . tadalafil (CIALIS) 20 MG tablet Take 1 tablet as directed 30 min prior to intercourse. 30 tablet 6  . tamsulosin (FLOMAX) 0.4 MG CAPS capsule Take 0.4 mg by  mouth.     No current facility-administered medications for this visit.      Physical Exam  Blood pressure 126/72, pulse 67, height '5\' 11"'$  (1.803 m), weight 179 lb (81.2 kg).  Constitutional: overall normal hygiene, normal nutrition, well developed, normal grooming, normal body habitus. Assistive device:none  Musculoskeletal: gait and station Limp left, muscle tone and strength are normal, no tremors or atrophy is present.  .  Neurological: coordination overall normal.  Deep tendon reflex/nerve stretch intact.  Sensation normal.  Cranial nerves II-XII intact.   Skin:   Normal overall no scars, lesions, ulcers or rashes. No psoriasis.  Psychiatric: Alert and oriented x 3.  Recent memory intact, remote memory unclear.  Normal mood and affect. Well groomed.  Good eye contact.  Cardiovascular: overall no swelling, no varicosities, no edema bilaterally, normal temperatures of the legs and arms, no clubbing, cyanosis and good capillary refill.  Lymphatic: palpation is normal.  Left heel has pain centrally plantar.  NV intact. ROM ankle is full. All other systems reviewed and are negative   The patient has been educated about the nature of the problem(s) and counseled on treatment options.  The patient appeared to understand what I have discussed and is in agreement with it.  Encounter Diagnosis  Name Primary?  . Chronic heel pain, left Yes   Procedure note: After permission from the patient the area of the mid plantar left heel was prepped.  I injected 1% plain xylocaine and 1 cc of DepoMedrol 40 by sterile technique into the heel area tolerated well.  PLAN Call if any problems.  Precautions discussed.  Continue current medications.   Return to clinic 5 weeks.   Electronically Signed Sanjuana Kava, MD 10/17/20198:54 AM

## 2018-05-22 ENCOUNTER — Ambulatory Visit: Payer: Medicare HMO | Admitting: Orthopaedic Surgery

## 2018-05-23 ENCOUNTER — Encounter: Payer: Self-pay | Admitting: Orthopaedic Surgery

## 2018-09-01 DIAGNOSIS — R69 Illness, unspecified: Secondary | ICD-10-CM | POA: Diagnosis not present

## 2018-11-09 ENCOUNTER — Other Ambulatory Visit: Payer: Self-pay | Admitting: Cardiology

## 2018-11-10 DIAGNOSIS — R69 Illness, unspecified: Secondary | ICD-10-CM | POA: Diagnosis not present

## 2019-03-10 DIAGNOSIS — R69 Illness, unspecified: Secondary | ICD-10-CM | POA: Diagnosis not present

## 2019-03-27 ENCOUNTER — Encounter: Payer: Self-pay | Admitting: Cardiology

## 2019-03-27 ENCOUNTER — Ambulatory Visit (INDEPENDENT_AMBULATORY_CARE_PROVIDER_SITE_OTHER): Payer: Medicare HMO | Admitting: Cardiology

## 2019-03-27 ENCOUNTER — Other Ambulatory Visit: Payer: Self-pay

## 2019-03-27 VITALS — BP 150/70 | HR 67 | Temp 98.0°F | Ht 71.0 in | Wt 178.0 lb

## 2019-03-27 DIAGNOSIS — I10 Essential (primary) hypertension: Secondary | ICD-10-CM

## 2019-03-27 DIAGNOSIS — I25810 Atherosclerosis of coronary artery bypass graft(s) without angina pectoris: Secondary | ICD-10-CM | POA: Diagnosis not present

## 2019-03-27 DIAGNOSIS — I714 Abdominal aortic aneurysm, without rupture, unspecified: Secondary | ICD-10-CM

## 2019-03-27 DIAGNOSIS — E782 Mixed hyperlipidemia: Secondary | ICD-10-CM | POA: Diagnosis not present

## 2019-03-27 DIAGNOSIS — I493 Ventricular premature depolarization: Secondary | ICD-10-CM

## 2019-03-27 MED ORDER — ATORVASTATIN CALCIUM 40 MG PO TABS: 40.0000 mg | ORAL_TABLET | Freq: Every day | ORAL | 3 refills | Status: DC

## 2019-03-27 MED ORDER — RAMIPRIL 10 MG PO CAPS: ORAL_CAPSULE | ORAL | 3 refills | Status: DC

## 2019-03-27 MED ORDER — METOPROLOL TARTRATE 25 MG PO TABS: 25.0000 mg | ORAL_TABLET | Freq: Two times a day (BID) | ORAL | 3 refills | Status: DC

## 2019-03-27 NOTE — Progress Notes (Signed)
Clinical Summary Mr. Lewellen is a 68 y.o.male seen today for follow up of the following medical problems   1. CAD  - prior 3 vessel CABG in 09/2012 (LIMA to LAD, SVG to ramus, SVG to OM), history of PCI prior to that. 09/2012 LVEF 60-65% by echo    - no recent chest pain. Denies any SOB/DOE - compliant with meds.    2. HTN  - compliant with meds - no recent orthostatic symptoms since off HCTZ.   3. HL  - 01/2018: TC 101 TG 79 HDL 43 LDL 42 - compliant with statin  4. AAA  - [place] 09/2013 3.5 x 4.5, slightly larger than prior study a year prior (3.3x3.9) - for repeat study in 2019  - no recent symptoms.     5. PVCs - some palpitations at times, overall mild.   SH: Retired Engineer, structural. On disabilty due to cardaic condition and chronic joint pains.    Past Medical History:  Diagnosis Date  . AAA (abdominal aortic aneurysm) (HCC)    3.3 cm in 2012  . ABDOMINAL AORTIC ANEURYSM 11/07/2009   Diameter of 3.3 cm by CT in 12/2010   . Anxiety   . ASCVD (arteriosclerotic cardiovascular disease)    LAD stent in 1999; RCA bare-metal stent in 12/2007, LAD stent patent, 50-60% proximal Cx lesion treated medically; 09/2012: CABG x3, LIMA to LAD, Sequential SVG to ramus intermediate and OM Tenisha Fleece with EVH via right thigh  . Coronary artery disease   . Depression   . Diabetes mellitus   . DIABETES MELLITUS, TYPE II 08/09/2010   no insulin    . ED (erectile dysfunction)   . Gastric ulcer   . Hepatic steatosis   . Hyperlipidemia   . HYPERLIPIDEMIA 08/09/2010   Lipid profile in 07/2010:144, 124, 34, 86; 01/2012:93, 82, 34, 43   . Insomnia   . MI (myocardial infarction) ([place])   . Nephrolithiasis   . NEPHROLITHIASIS, RECURRENT 08/09/2010   Obstruction of the ureter on CT scan of the abdomen resulting in right hydronephrosis in 12/2010; bladder outlet obstruction prior to that.  CT also revealed atherosclerotic changes in the abdominal arterial vessels, hepatic granuloma  and calcification of the prostate.   . S/P CABG x 3 10/07/2012   LIMA to LAD, Sequential SVG to ramus intermediate and OM Xander Jutras with EVH via right thigh  . THROMBOCYTOPENIA 08/09/2010   Platelets 88-114,000 over the past 4 years; prior evaluation by Dr. Tressie Stalker without a specific etiology identified.  01/2012: Normal CBC ex platelets-120   . Thrombocytopenia ([place])    Chronic; evaluated by Dr. Tressie Stalker in 2007  . Tobacco abuse      Allergies  Allergen Reactions  . Meperidine Hcl Other (See Comments)    Was mixed with phenergan and turned blood vessels red.   . Promethazine Hcl Other (See Comments)    Was mixed with the Demerol and caused veins to turn red.      Current Outpatient Medications  Medication Sig Dispense Refill  . aspirin EC 81 MG tablet Take 81 mg by mouth daily.    Marland Kitchen atorvastatin (LIPITOR) 40 MG tablet TAKE 1 TABLET BY MOUTH EVERY DAY 90 tablet 3  . metoprolol tartrate (LOPRESSOR) 25 MG tablet Take 1 tablet (25 mg total) by mouth 2 (two) times daily. 180 tablet 3  . pioglitazone-metformin (ACTOPLUS MET) 15-850 MG tablet Take 1 tablet by mouth 2 (two) times daily.  5  . ramipril (ALTACE) 10 MG  capsule TAKE 2 CAPSULES BY MOUTH EVERY DAY WITH BREAKFAST 180 capsule 3  . tadalafil (CIALIS) 20 MG tablet Take 1 tablet as directed 30 min prior to intercourse. 30 tablet 6   No current facility-administered medications for this visit.      Past Surgical History:  Procedure Laterality Date  . [place], 2009  . CORONARY ARTERY BYPASS GRAFT N/A 10/07/2012   Procedure: CORONARY ARTERY BYPASS GRAFTING (CABG);  Surgeon: Rexene Alberts, MD;  Location: [facility];  Service: Open Heart Surgery;  Laterality: N/A;  . CYSTOSCOPY WITH RETROGRADE PYELOGRAM, URETEROSCOPY AND STENT PLACEMENT Left 08/30/2015   Procedure: CYSTOSCOPY LEFT RETROGRADE PYELOGRAM  LEFT   URETEROSCOPY,  LITHOLAPAXY;  Surgeon: Irine Seal, MD;  [facility];  Service: Urology;   Laterality: Left;  . ESOPHAGOGASTRODUODENOSCOPY  04/04/2012   Procedure: ESOPHAGOGASTRODUODENOSCOPY (EGD);  Surgeon: Rogene Houston, MD;  [facility];  Service: Endoscopy;  Laterality: N/A;  830  . ESOPHAGOGASTRODUODENOSCOPY  07/25/2012   Procedure: ESOPHAGOGASTRODUODENOSCOPY (EGD);  Surgeon: Rogene Houston, MD;  [facility];  Service: Endoscopy;  Laterality: N/A;  830  . HOLMIUM LASER APPLICATION Left 3/79/0240   Procedure: HOLMIUM LASER APPLICATION;  Surgeon: Irine Seal, MD;  [facility];  Service: Urology;  Laterality: Left;  . INGUINAL HERNIA REPAIR    . INTRAOPERATIVE TRANSESOPHAGEAL ECHOCARDIOGRAM N/A 10/07/2012   Procedure: INTRAOPERATIVE TRANSESOPHAGEAL ECHOCARDIOGRAM;  Surgeon: Rexene Alberts, MD;  Location: [facility];  Service: Open Heart Surgery;  Laterality: N/A;  . LEFT HEART CATHETERIZATION WITH CORONARY ANGIOGRAM N/A 10/03/2012   Procedure: LEFT HEART CATHETERIZATION WITH CORONARY ANGIOGRAM;  Surgeon: Minus Breeding, MD;  Location[facility];  Service: Cardiovascular;  Laterality: N/A;  . LITHOTRIPSY       Allergies  Allergen Reactions  . Meperidine Hcl Other (See Comments)    Was mixed with phenergan and turned blood vessels red.   . Promethazine Hcl Other (See Comments)    Was mixed with the Demerol and caused veins to turn red.       Family History  Problem Relation Age of Onset  . Ovarian cancer Sister      Social History Mr. Koenigs reports that he has been smoking cigarettes. He has a 20.00 pack-year smoking history. He has never used smokeless tobacco. [name] reports no history of alcohol use.   Review of Systems CONSTITUTIONAL: No weight loss, fever, chills, weakness or fatigue.  HEENT: Eyes: No visual loss, blurred vision, double vision or yellow sclerae.No hearing loss, sneezing, congestion, runny nose or sore throat.  SKIN: No rash or itching.  CARDIOVASCULAR: per hpi RESPIRATORY: No shortness of breath, cough or sputum.   GASTROINTESTINAL: No anorexia, nausea, vomiting or diarrhea. No abdominal pain or blood.  GENITOURINARY: No burning on urination, no polyuria NEUROLOGICAL: No headache, dizziness, syncope, paralysis, ataxia, numbness or tingling in the extremities. No change in bowel or bladder control.  MUSCULOSKELETAL: No muscle, back pain, joint pain or stiffness.  LYMPHATICS: No enlarged nodes. No history of splenectomy.  PSYCHIATRIC: No history of depression or anxiety.  ENDOCRINOLOGIC: No reports of sweating, cold or heat intolerance. No polyuria or polydipsia.  Marland Kitchen   Physical Examination Vitals:   03/27/19 1104  BP: (!) 150/70  Pulse: 67  Temp: 98 F (36.7 C)   Filed Weights   03/27/19 1104  Weight: 178 lb (80.7 kg)    Gen: resting comfortably, no acute distress HEENT: no scleral icterus,  pupils equal round and reactive, no palptable cervical adenopathy,  CV: RRR, no m/r/g, no jvd Resp: Clear to auscultation bilaterally GI: abdomen is soft, non-tender, non-distended, normal bowel sounds, no hepatosplenomegaly MSK: extremities are warm, no edema.  Skin: warm, no rash Neuro:  no focal deficits Psych: appropriate affect   Diagnostic Studies  09/2012 Echo: LVEF 55-60%, no WMAs,   09/2012 AAA [place]: 3.3 x 3.9 cm, stable from prior CT  08/2013 AAA [place] Infrarenal abdominal aortic aneurysm measuring 3.5 x 4.5 cm.  Previous examination of 10/06/2012 demonstrated the abdominal aorta  measuring 3.3 x 3.9 cm.  08/2014 AAA US IMPRESSION: 4.4 cm infrarenal abdominal aortic aneurysm. Recommend followup by [place] in 3 years. This recommendation follows ACR consensus guidelines: White Paper of the ACR Incidental Findings Committee II on Vascular Findings. J Am Coll Radiol 2013; 50:757-322   Assessment and Plan  1. CAD  -no symptoms, continue current meds - EKG today SR and chronic ST/T changes, PVCs  2. HTN  - manual recheck 134/70, essentially at goal. Continue current meds  3.  Hyperlipidemia -LDL has been at goal, continue statin. Labs followed by pcp.   4. PVCs - asymptomatic, suspect related to history of CAD - continue beta blocker  4. AAA  -repeat US  F/u 1 year      Arnoldo Lenis, M.D.

## 2019-03-27 NOTE — Patient Instructions (Signed)
Medication Instructions:  Your physician recommends that you continue on your current medications as directed. Please refer to the Current Medication list given to you today.   Labwork: NONE  Testing/Procedures: AAA ULTRASOUND   Follow-Up: Your physician wants you to follow-up in: 1 YEAR.  You will receive a reminder letter in the mail two months in advance. If you don't receive a letter, please call our office to schedule the follow-up appointment.   Any Other Special Instructions Will Be Listed Below (If Applicable).     If you need a refill on your cardiac medications before your next appointment, please call your pharmacy.

## 2019-04-06 ENCOUNTER — Ambulatory Visit (HOSPITAL_COMMUNITY)
Admission: RE | Admit: 2019-04-06 | Discharge: 2019-04-06 | Disposition: A | Discharge status: Home / Self Care | Payer: Medicare HMO | Source: Ambulatory Visit | Attending: Cardiology | Admitting: Cardiology

## 2019-04-06 ENCOUNTER — Other Ambulatory Visit: Payer: Self-pay

## 2019-04-06 DIAGNOSIS — I714 Abdominal aortic aneurysm, without rupture, unspecified: Secondary | ICD-10-CM

## 2019-04-07 ENCOUNTER — Telehealth: Payer: Self-pay | Admitting: Cardiology

## 2019-04-07 ENCOUNTER — Telehealth: Payer: Self-pay

## 2019-04-07 DIAGNOSIS — I714 Abdominal aortic aneurysm, without rupture, unspecified: Secondary | ICD-10-CM

## 2019-04-07 NOTE — Telephone Encounter (Signed)
-----   Message from Arnoldo Lenis, MD sent at 04/07/2019  2:09 PM EDT ----- Abdominal aortic aneurysm has enlarged some since last check. Its not large enough to warrant an intervention but has grown enough that we should get him plugged in with vascular to help monitor the aneurysm. Can we rerefer him to vascular in [facility] for abdominal aortic aneurysm   Zandra Abts MD

## 2019-04-07 NOTE — Telephone Encounter (Signed)
Referral placed to [facility], lm for patient to call back

## 2019-04-07 NOTE — Telephone Encounter (Signed)
Calling for ultrasound results

## 2019-04-07 NOTE — Telephone Encounter (Signed)
Results given, patient aware referral was placed for [facility]

## 2019-04-20 DIAGNOSIS — R69 Illness, unspecified: Secondary | ICD-10-CM | POA: Diagnosis not present

## 2019-05-21 ENCOUNTER — Ambulatory Visit (INDEPENDENT_AMBULATORY_CARE_PROVIDER_SITE_OTHER): Payer: Medicare HMO | Admitting: Vascular Surgery

## 2019-05-21 ENCOUNTER — Encounter: Payer: Self-pay | Admitting: Vascular Surgery

## 2019-05-21 ENCOUNTER — Other Ambulatory Visit: Payer: Self-pay

## 2019-05-21 VITALS — BP 161/92 | HR 61 | Temp 97.6°F | Resp 20 | Ht 71.0 in | Wt 177.5 lb

## 2019-05-21 DIAGNOSIS — I714 Abdominal aortic aneurysm, without rupture, unspecified: Secondary | ICD-10-CM

## 2019-05-21 NOTE — Progress Notes (Signed)
Referring Physician: Dr. Harl Bowie  Patient name: [name] MRN: [id number] DOB: 1950/07/16 Sex: male  REASON FOR CONSULT: Abdominal aortic aneurysm  HPI: [name] is a 68 y.o. male known abdominal aortic aneurysm which has been slowly growing over the last several years.  It was initially 3.3 cm in diameter in 2012.  It has now grown to 4.9 cm in diameter as of April 06, 2019 on ultrasound.  Patient has no abdominal pain.  He has intermittent kidney stone pain but states he has not had any pain that was constant and unremitting.  He has no back pain.  He overall is very active and is a retired Engineer, structural.  He is currently smoking but is trying to quit.  He smokes about 1 pack to 2 packs/week.  He has no family history of abdominal aortic aneurysm.  Other medical problems include chronic thrombocytopenia, coronary artery disease, diabetes, elevated lipids.  These are all currently controlled.  Previous work-up for his thrombocytopenia did not really show any obvious etiology.  He is on a statin and aspirin.  Past Medical History:  Diagnosis Date  . AAA (abdominal aortic aneurysm) (HCC)    3.3 cm in 2012  . ABDOMINAL AORTIC ANEURYSM 11/07/2009   Diameter of 3.3 cm by CT in 12/2010   . Anxiety   . ASCVD (arteriosclerotic cardiovascular disease)    LAD stent in 1999; RCA bare-metal stent in 12/2007, LAD stent patent, 50-60% proximal Cx lesion treated medically; 09/2012: CABG x3, LIMA to LAD, Sequential SVG to ramus intermediate and OM branch with EVH via right thigh  . Coronary artery disease   . Depression   . Diabetes mellitus   . DIABETES MELLITUS, TYPE II 08/09/2010   no insulin    . ED (erectile dysfunction)   . Gastric ulcer   . Hepatic steatosis   . Hyperlipidemia   . HYPERLIPIDEMIA 08/09/2010   Lipid profile in 07/2010:144, 124, 34, 86; 01/2012:93, 82, 34, 43   . Insomnia   . MI (myocardial infarction) ([place])   . Nephrolithiasis   . NEPHROLITHIASIS, RECURRENT 08/09/2010   Obstruction of the ureter on CT scan of the abdomen resulting in right hydronephrosis in 12/2010; bladder outlet obstruction prior to that.  CT also revealed atherosclerotic changes in the abdominal arterial vessels, hepatic granuloma and calcification of the prostate.   . S/P CABG x 3 10/07/2012   LIMA to LAD, Sequential SVG to ramus intermediate and OM branch with EVH via right thigh  . THROMBOCYTOPENIA 08/09/2010   Platelets 88-114,000 over the past 4 years; prior evaluation by Dr. Tressie Stalker without a specific etiology identified.  01/2012: Normal CBC ex platelets-120   . Thrombocytopenia ([place])    Chronic; evaluated by Dr. Tressie Stalker in 2007  . Tobacco abuse    Past Surgical History:  Procedure Laterality Date  . [place], 2009  . CORONARY ARTERY BYPASS GRAFT N/A 10/07/2012   Procedure: CORONARY ARTERY BYPASS GRAFTING (CABG);  Surgeon: Rexene Alberts, MD;  Location: [place];  Service: Open Heart Surgery;  Laterality: N/A;  . CYSTOSCOPY WITH RETROGRADE PYELOGRAM, URETEROSCOPY AND STENT PLACEMENT Left 08/30/2015   Procedure: CYSTOSCOPY LEFT RETROGRADE PYELOGRAM  LEFT   URETEROSCOPY,  LITHOLAPAXY;  Surgeon: Irine Seal, MD;  Location: [facility];  Service: Urology;  Laterality: Left;  . ESOPHAGOGASTRODUODENOSCOPY  04/04/2012   Procedure: ESOPHAGOGASTRODUODENOSCOPY (EGD);  Surgeon: Rogene Houston, MD;  [facility];  Service:  Endoscopy;  Laterality: N/A;  830  . ESOPHAGOGASTRODUODENOSCOPY  07/25/2012   Procedure: ESOPHAGOGASTRODUODENOSCOPY (EGD);  Surgeon: Rogene Houston, MD;  [facility];  Service: Endoscopy;  Laterality: N/A;  830  . HOLMIUM LASER APPLICATION Left 8/41/6606   Procedure: HOLMIUM LASER APPLICATION;  Surgeon: Irine Seal, MD;  [facility];  Service: Urology;  Laterality: Left;  . INGUINAL HERNIA REPAIR    . INTRAOPERATIVE TRANSESOPHAGEAL ECHOCARDIOGRAM N/A 10/07/2012   Procedure: INTRAOPERATIVE TRANSESOPHAGEAL ECHOCARDIOGRAM;   Surgeon: Rexene Alberts, MD;  Location: [place];  Service: Open Heart Surgery;  Laterality: N/A;  . LEFT HEART CATHETERIZATION WITH CORONARY ANGIOGRAM N/A 10/03/2012   Procedure: LEFT HEART CATHETERIZATION WITH CORONARY ANGIOGRAM;  Surgeon: Minus Breeding, MD;  Location: [facility];  Service: Cardiovascular;  Laterality: N/A;  . LITHOTRIPSY      Family History  Problem Relation Age of Onset  . Ovarian cancer Sister     SOCIAL HISTORY: Social History   Socioeconomic History  . Marital status: Married    Spouse name: Not on file  . Number of children: Not on file  . Years of education: Not on file  . Highest education level: Not on file  Occupational History  . Not on file  Social Needs  . Financial resource strain: Not on file  . Food insecurity    Worry: Not on file    Inability: Not on file  . Transportation needs    Medical: Not on file    Non-medical: Not on file  Tobacco Use  . Smoking status: Current Some Day Smoker    Packs/day: 0.50    Years: 40.00    Pack years: 20.00    Types: Cigarettes    Last attempt to quit: 07/09/2013    Years since quitting: 5.8  . Smokeless tobacco: Never Used  . Tobacco comment: 6-7 cigs. daily   Substance and Sexual Activity  . Alcohol use: No    Alcohol/week: 0.0 standard drinks  . Drug use: No  . Sexual activity: Not on file  Lifestyle  . Physical activity    Days per week: Not on file    Minutes per session: Not on file  . Stress: Not on file  Relationships  . Social Herbalist on phone: Not on file    Gets together: Not on file    Attends religious service: Not on file    Active member of club or organization: Not on file    Attends meetings of clubs or organizations: Not on file    Relationship status: Not on file  . Intimate partner violence    Fear of current or ex partner: Not on file    Emotionally abused: Not on file    Physically abused: Not on file    Forced sexual activity: Not on file  Other Topics  Concern  . Not on file  Social History Narrative   No regular exercise    Allergies  Allergen Reactions  . Meperidine Hcl Other (See Comments)    Was mixed with phenergan and turned blood vessels red.   . Promethazine Hcl Other (See Comments)    Was mixed with the Demerol and caused veins to turn red.     Current Outpatient Medications  Medication Sig Dispense Refill  . aspirin EC 81 MG tablet Take 81 mg by mouth daily.    Marland Kitchen atorvastatin (LIPITOR) 40 MG tablet Take 1 tablet (40 mg total) by mouth daily. 90 tablet  3  . metoprolol tartrate (LOPRESSOR) 25 MG tablet Take 1 tablet (25 mg total) by mouth 2 (two) times daily. 180 tablet 3  . pioglitazone-metformin (ACTOPLUS MET) 15-850 MG tablet Take 1 tablet by mouth 2 (two) times daily.  5  . ramipril (ALTACE) 10 MG capsule TAKE 2 CAPSULES BY MOUTH EVERY DAY WITH BREAKFAST 180 capsule 3  . tadalafil (CIALIS) 20 MG tablet Take 1 tablet as directed 30 min prior to intercourse. 30 tablet 6   No current facility-administered medications for this visit.     ROS:   General:  No weight loss, Fever, chills  HEENT: No recent headaches, no nasal bleeding, no visual changes, no sore throat  Neurologic: No dizziness, blackouts, seizures. No recent symptoms of stroke or mini- stroke. No recent episodes of slurred speech, or temporary blindness.  Cardiac: No recent episodes of chest pain/pressure, no shortness of breath at rest.  No shortness of breath with exertion.  Denies history of atrial fibrillation or irregular heartbeat  Vascular: No history of rest pain in feet.  No history of claudication.  No history of non-healing ulcer, No history of DVT   Pulmonary: No home oxygen, no productive cough, no hemoptysis,  No asthma or wheezing  Musculoskeletal:  '[ ]'$  Arthritis, '[ ]'$  Low back pain,  '[ ]'$  Joint pain  Hematologic:No history of hypercoagulable state.  No history of easy bleeding.  No history of anemia  Gastrointestinal: No hematochezia or  melena,  No gastroesophageal reflux, no trouble swallowing  Urinary: '[ ]'$  chronic Kidney disease, '[ ]'$  on HD - '[ ]'$  MWF or '[ ]'$  TTHS, '[ ]'$  Burning with urination, '[ ]'$  Frequent urination, '[ ]'$  Difficulty urinating;   Skin: No rashes  Psychological: No history of anxiety,  No history of depression   Physical Examination  Vitals:   05/21/19 1441  BP: (!) 161/92  Pulse: 61  Resp: 20  Temp: 97.6 F (36.4 C)  SpO2: 95%  Weight: 177 lb 8 oz (80.5 kg)  Height: '5\' 11"'$  (1.803 m)    Body mass index is 24.76 kg/m.  General:  Alert and oriented, no acute distress HEENT: Normal Neck: No JVD Pulmonary: Clear to auscultation bilaterally Cardiac: Regular Rate and Rhythm without murmur Abdomen: Soft, non-tender, non-distended, no mass Skin: No rash Extremity Pulses:  2+ radial, brachial, femoral, absent bilateral dorsalis pedis, 2+ posterior tibial pulses bilaterally Musculoskeletal: No deformity or edema  Neurologic: Upper and lower extremity motor 5/5 and symmetric  DATA:  I reviewed the patient's recent ultrasound which shows a 4.9 cm abdominal aortic aneurysm  ASSESSMENT: Slowly growing asymptomatic abdominal aortic aneurysm.   PLAN: CT angiogram abdomen and pelvis to establish baseline.  If aneurysm is greater than 5-1/2 cm in diameter we would consider repair.  If it is less than this we will probably arrange for follow-up ultrasound in 6 months time.  The patient will see me back in 2 weeks time to go over his CT results.  He was informed that if he develops severe abdominal or back pain he should let any emergency room physician know that he has a history of abdominal aortic aneurysm.   Ruta Hinds, MD [facility] [place] Office: [phone] Pager: [phone]

## 2019-05-27 ENCOUNTER — Other Ambulatory Visit: Payer: Self-pay | Admitting: Vascular Surgery

## 2019-05-27 DIAGNOSIS — I714 Abdominal aortic aneurysm, without rupture, unspecified: Secondary | ICD-10-CM

## 2019-06-03 ENCOUNTER — Other Ambulatory Visit: Payer: Self-pay

## 2019-06-03 ENCOUNTER — Ambulatory Visit (HOSPITAL_COMMUNITY)
Admission: RE | Admit: 2019-06-03 | Discharge: 2019-06-03 | Disposition: A | Discharge status: Home / Self Care | Payer: Medicare HMO | Source: Ambulatory Visit | Attending: Vascular Surgery | Admitting: Vascular Surgery

## 2019-06-03 DIAGNOSIS — I714 Abdominal aortic aneurysm, without rupture, unspecified: Secondary | ICD-10-CM

## 2019-06-03 MED ORDER — IOHEXOL 350 MG/ML SOLN
100.0000 mL | Freq: Once | INTRAVENOUS | Status: AC | PRN
  Administered 2019-06-03: 100 mL via INTRAVENOUS

## 2019-06-04 ENCOUNTER — Encounter: Payer: Self-pay | Admitting: Vascular Surgery

## 2019-06-04 ENCOUNTER — Ambulatory Visit: Payer: Medicare HMO | Admitting: Vascular Surgery

## 2019-06-04 VITALS — BP 148/78 | HR 38 | Resp 18 | Ht 71.0 in | Wt 177.0 lb

## 2019-06-04 DIAGNOSIS — I714 Abdominal aortic aneurysm, without rupture, unspecified: Secondary | ICD-10-CM

## 2019-06-04 NOTE — Progress Notes (Signed)
Patient name: [name] MRN: [id number] DOB: 1951-06-26 Sex: male    HPI: [name] is a 68 y.o. male, who returns for follow-up today after recent CT scan to further evaluate abdominal aortic aneurysm.  Recent ultrasound suggested this may be 4.9 cm in diameter.  He has no abdominal or back pain.  Other medical problems include coronary artery disease, diabetes, hyperlipidemia all of which have been stable.  He is on aspirin and a statin.  Past Medical History:  Diagnosis Date  . AAA (abdominal aortic aneurysm) (HCC)    3.3 cm in 2012  . ABDOMINAL AORTIC ANEURYSM 11/07/2009   Diameter of 3.3 cm by CT in 12/2010   . Anxiety   . ASCVD (arteriosclerotic cardiovascular disease)    LAD stent in 1999; RCA bare-metal stent in 12/2007, LAD stent patent, 50-60% proximal Cx lesion treated medically; 09/2012: CABG x3, LIMA to LAD, Sequential SVG to ramus intermediate and OM branch with EVH via right thigh  . Coronary artery disease   . Depression   . Diabetes mellitus   . DIABETES MELLITUS, TYPE II 08/09/2010   no insulin    . ED (erectile dysfunction)   . Gastric ulcer   . Hepatic steatosis   . Hyperlipidemia   . HYPERLIPIDEMIA 08/09/2010   Lipid profile in 07/2010:144, 124, 34, 86; 01/2012:93, 82, 34, 43   . Insomnia   . MI (myocardial infarction) ([place])   . Nephrolithiasis   . NEPHROLITHIASIS, RECURRENT 08/09/2010   Obstruction of the ureter on CT scan of the abdomen resulting in right hydronephrosis in 12/2010; bladder outlet obstruction prior to that.  CT also revealed atherosclerotic changes in the abdominal arterial vessels, hepatic granuloma and calcification of the prostate.   . S/P CABG x 3 10/07/2012   LIMA to LAD, Sequential SVG to ramus intermediate and OM branch with EVH via right thigh  . THROMBOCYTOPENIA 08/09/2010   Platelets 88-114,000 over the past 4 years; prior evaluation by Dr. Tressie Stalker without a specific etiology identified.  01/2012: Normal CBC ex platelets-120   .  Thrombocytopenia ([place])    Chronic; evaluated by Dr. Tressie Stalker in 2007  . Tobacco abuse    Past Surgical History:  Procedure Laterality Date  . [place], 2009  . CORONARY ARTERY BYPASS GRAFT N/A 10/07/2012   Procedure: CORONARY ARTERY BYPASS GRAFTING (CABG);  Surgeon: Rexene Alberts, MD;  Location: [place];  Service: Open Heart Surgery;  Laterality: N/A;  . CYSTOSCOPY WITH RETROGRADE PYELOGRAM, URETEROSCOPY AND STENT PLACEMENT Left 08/30/2015   Procedure: CYSTOSCOPY LEFT RETROGRADE PYELOGRAM  LEFT   URETEROSCOPY,  LITHOLAPAXY;  Surgeon: Irine Seal, MD;  [facility];  Service: Urology;  Laterality: Left;  . ESOPHAGOGASTRODUODENOSCOPY  04/04/2012   Procedure: ESOPHAGOGASTRODUODENOSCOPY (EGD);  Surgeon: Rogene Houston, MD;  [facility];  Service: Endoscopy;  Laterality: N/A;  830  . ESOPHAGOGASTRODUODENOSCOPY  07/25/2012   Procedure: ESOPHAGOGASTRODUODENOSCOPY (EGD);  Surgeon: Rogene Houston, MD;  [facility];  Service: Endoscopy;  Laterality: N/A;  830  . HOLMIUM LASER APPLICATION Left 09/28/5186   Procedure: HOLMIUM LASER APPLICATION;  Surgeon: Irine Seal, MD;  [facility];  Service: Urology;  Laterality: Left;  . INGUINAL HERNIA REPAIR    . INTRAOPERATIVE TRANSESOPHAGEAL ECHOCARDIOGRAM N/A 10/07/2012   Procedure: INTRAOPERATIVE TRANSESOPHAGEAL ECHOCARDIOGRAM;  Surgeon: Rexene Alberts, MD;  Location: [place];  Service: Open Heart Surgery;  Laterality: N/A;  . LEFT HEART CATHETERIZATION  WITH CORONARY ANGIOGRAM N/A 10/03/2012   Procedure: LEFT HEART CATHETERIZATION WITH CORONARY ANGIOGRAM;  Surgeon: Minus Breeding, MD;  Location: [facility] CATH LAB;  Service: Cardiovascular;  Laterality: N/A;  . LITHOTRIPSY      Family History  Problem Relation Age of Onset  . Ovarian cancer Sister     SOCIAL HISTORY: Social History   Socioeconomic History  . Marital status: Married    Spouse name: Not on file  . Number of children: Not on  file  . Years of education: Not on file  . Highest education level: Not on file  Occupational History  . Not on file  Social Needs  . Financial resource strain: Not on file  . Food insecurity    Worry: Not on file    Inability: Not on file  . Transportation needs    Medical: Not on file    Non-medical: Not on file  Tobacco Use  . Smoking status: Current Some Day Smoker    Packs/day: 0.50    Years: 40.00    Pack years: 20.00    Types: Cigarettes    Last attempt to quit: 07/09/2013    Years since quitting: 5.9  . Smokeless tobacco: Never Used  . Tobacco comment: 6-7 cigs. daily   Substance and Sexual Activity  . Alcohol use: No    Alcohol/week: 0.0 standard drinks  . Drug use: No  . Sexual activity: Not on file  Lifestyle  . Physical activity    Days per week: Not on file    Minutes per session: Not on file  . Stress: Not on file  Relationships  . Social Herbalist on phone: Not on file    Gets together: Not on file    Attends religious service: Not on file    Active member of club or organization: Not on file    Attends meetings of clubs or organizations: Not on file    Relationship status: Not on file  . Intimate partner violence    Fear of current or ex partner: Not on file    Emotionally abused: Not on file    Physically abused: Not on file    Forced sexual activity: Not on file  Other Topics Concern  . Not on file  Social History Narrative   No regular exercise    Allergies  Allergen Reactions  . Meperidine Hcl Other (See Comments)    Was mixed with phenergan and turned blood vessels red.   . Promethazine Hcl Other (See Comments)    Was mixed with the Demerol and caused veins to turn red.     Current Outpatient Medications  Medication Sig Dispense Refill  . aspirin EC 81 MG tablet Take 81 mg by mouth daily.    Marland Kitchen atorvastatin (LIPITOR) 40 MG tablet Take 1 tablet (40 mg total) by mouth daily. 90 tablet 3  . metoprolol tartrate (LOPRESSOR) 25 MG  tablet Take 1 tablet (25 mg total) by mouth 2 (two) times daily. 180 tablet 3  . pioglitazone-metformin (ACTOPLUS MET) 15-850 MG tablet Take 1 tablet by mouth 2 (two) times daily.  5  . ramipril (ALTACE) 10 MG capsule TAKE 2 CAPSULES BY MOUTH EVERY DAY WITH BREAKFAST 180 capsule 3  . tadalafil (CIALIS) 20 MG tablet Take 1 tablet as directed 30 min prior to intercourse. 30 tablet 6   No current facility-administered medications for this visit.     ROS:   General:  No weight loss, Fever, chills  HEENT: No recent headaches, no nasal bleeding, no visual changes, no sore throat  Neurologic: No dizziness, blackouts, seizures. No recent symptoms of stroke or mini- stroke. No recent episodes of slurred speech, or temporary blindness.  Cardiac: No recent episodes of chest pain/pressure, no shortness of breath at rest.  No shortness of breath with exertion.  Denies history of atrial fibrillation or irregular heartbeat  Vascular: No history of rest pain in feet.  No history of claudication.  No history of non-healing ulcer, No history of DVT    Physical Examination  Vitals:   06/04/19 1334  BP: (!) 148/78  Pulse: (!) 38  Resp: 18  SpO2: 97%  Weight: 177 lb (80.3 kg)  Height: '5\' 11"'$  (1.803 m)    Body mass index is 24.69 kg/m.  General:  Alert and oriented, no acute distress HEENT: Normal Neck: No JVD Cardiac: Slightly bradycardic irregularly irregular every third beat Abdomen: Soft, non-tender, non-distended, no mass Skin: No rash Musculoskeletal: No deformity or edema  Neurologic: Upper and lower extremity motor 5/5 and symmetric  DATA:  I reviewed the patient's CT scan of abdomen and pelvis today.  On my measurement of the images the aneurysm measures 4.4 cm AP 4.5 cm transverse no iliac artery aneurysms  ASSESSMENT: Patient with 4.5 cm infrarenal abdominal aortic aneurysm compared to 4.9 cm on recent ultrasound.  Most likely ultrasound was measured on oblique plane accounting  for discrepancy in size  Bradycardia asymptomatic patient is on a beta-blocker.  Patient on my clinical exam probably is having some PACs or PVCs.  He said Dr. Harl Bowie had noted this previously.  I did discuss with him that if he has any symptoms of syncope or presyncope he has should have Dr. Harl Bowie evaluate this further.   PLAN: Reassured patient that risk of rupture of a 4.5 cm abdominal aortic aneurysm is fairly low.  He will return in 6 months time for repeat abdominal aortic ultrasound.   Ruta Hinds, MD [facility] [place] Office: [phone] Pager: [phone]

## 2019-06-29 ENCOUNTER — Other Ambulatory Visit: Payer: Self-pay | Admitting: *Deleted

## 2019-06-29 DIAGNOSIS — I714 Abdominal aortic aneurysm, without rupture, unspecified: Secondary | ICD-10-CM

## 2019-09-07 DIAGNOSIS — I252 Old myocardial infarction: Secondary | ICD-10-CM | POA: Diagnosis not present

## 2019-09-07 DIAGNOSIS — I10 Essential (primary) hypertension: Secondary | ICD-10-CM | POA: Diagnosis not present

## 2019-09-07 DIAGNOSIS — N529 Male erectile dysfunction, unspecified: Secondary | ICD-10-CM | POA: Diagnosis not present

## 2019-09-07 DIAGNOSIS — E119 Type 2 diabetes mellitus without complications: Secondary | ICD-10-CM | POA: Diagnosis not present

## 2019-09-07 DIAGNOSIS — Z809 Family history of malignant neoplasm, unspecified: Secondary | ICD-10-CM | POA: Diagnosis not present

## 2019-09-07 DIAGNOSIS — Z87891 Personal history of nicotine dependence: Secondary | ICD-10-CM | POA: Diagnosis not present

## 2019-09-07 DIAGNOSIS — E785 Hyperlipidemia, unspecified: Secondary | ICD-10-CM | POA: Diagnosis not present

## 2019-09-07 DIAGNOSIS — Z7982 Long term (current) use of aspirin: Secondary | ICD-10-CM | POA: Diagnosis not present

## 2019-09-07 DIAGNOSIS — I2510 Atherosclerotic heart disease of native coronary artery without angina pectoris: Secondary | ICD-10-CM | POA: Diagnosis not present

## 2019-09-14 DIAGNOSIS — Z79899 Other long term (current) drug therapy: Secondary | ICD-10-CM | POA: Diagnosis not present

## 2019-09-14 DIAGNOSIS — R69 Illness, unspecified: Secondary | ICD-10-CM | POA: Diagnosis not present

## 2019-09-14 DIAGNOSIS — M810 Age-related osteoporosis without current pathological fracture: Secondary | ICD-10-CM | POA: Diagnosis not present

## 2019-09-21 DIAGNOSIS — E039 Hypothyroidism, unspecified: Secondary | ICD-10-CM | POA: Diagnosis not present

## 2019-09-21 DIAGNOSIS — N202 Calculus of kidney with calculus of ureter: Secondary | ICD-10-CM | POA: Diagnosis not present

## 2019-09-21 DIAGNOSIS — E119 Type 2 diabetes mellitus without complications: Secondary | ICD-10-CM | POA: Diagnosis not present

## 2019-09-21 DIAGNOSIS — Z6824 Body mass index (BMI) 24.0-24.9, adult: Secondary | ICD-10-CM | POA: Diagnosis not present

## 2019-09-21 DIAGNOSIS — Z125 Encounter for screening for malignant neoplasm of prostate: Secondary | ICD-10-CM | POA: Diagnosis not present

## 2019-09-21 DIAGNOSIS — E11319 Type 2 diabetes mellitus with unspecified diabetic retinopathy without macular edema: Secondary | ICD-10-CM | POA: Diagnosis not present

## 2019-09-21 DIAGNOSIS — I2510 Atherosclerotic heart disease of native coronary artery without angina pectoris: Secondary | ICD-10-CM | POA: Diagnosis not present

## 2019-09-21 DIAGNOSIS — Z0000 Encounter for general adult medical examination without abnormal findings: Secondary | ICD-10-CM | POA: Diagnosis not present

## 2019-09-21 DIAGNOSIS — I10 Essential (primary) hypertension: Secondary | ICD-10-CM | POA: Diagnosis not present

## 2019-09-21 DIAGNOSIS — E7849 Other hyperlipidemia: Secondary | ICD-10-CM | POA: Diagnosis not present

## 2019-09-21 DIAGNOSIS — Z1389 Encounter for screening for other disorder: Secondary | ICD-10-CM | POA: Diagnosis not present

## 2019-10-07 ENCOUNTER — Other Ambulatory Visit: Payer: Self-pay

## 2019-10-07 ENCOUNTER — Telehealth: Payer: Self-pay | Admitting: Urology

## 2019-10-07 DIAGNOSIS — N200 Calculus of kidney: Secondary | ICD-10-CM

## 2019-10-07 NOTE — Telephone Encounter (Signed)
Pt coming by tomorrow to pick up urine orders to take to [facility] when he goes to have KUB done.

## 2019-10-07 NOTE — Telephone Encounter (Signed)
Pts wife asked that the call be returned after 3pm today.

## 2019-10-07 NOTE — Telephone Encounter (Signed)
A KUB would be good.  He had a CT in 12/20 as you noted and had a large bladder stone seen.  We will need to get him in at some point soon, but go ahead and get the KUB and a UA/Culture would be helpful as well.

## 2019-10-07 NOTE — Telephone Encounter (Signed)
Last image was on dec 2020. Would you like to order any imaging?

## 2019-10-07 NOTE — Telephone Encounter (Signed)
Pt is having a issue with a kidney stone. Pts wife wanted to see if he could come in Friday but I informed her I dont have any openings. She requested a nurse return her call.

## 2019-10-08 ENCOUNTER — Other Ambulatory Visit: Payer: Self-pay

## 2019-10-08 ENCOUNTER — Ambulatory Visit (HOSPITAL_COMMUNITY)
Admission: RE | Admit: 2019-10-08 | Discharge: 2019-10-08 | Disposition: A | Discharge status: Home / Self Care | Payer: Medicare HMO | Source: Ambulatory Visit | Attending: Urology | Admitting: Urology

## 2019-10-08 DIAGNOSIS — N200 Calculus of kidney: Secondary | ICD-10-CM | POA: Diagnosis not present

## 2019-10-09 ENCOUNTER — Ambulatory Visit: Payer: Medicare HMO | Admitting: Urology

## 2019-10-09 ENCOUNTER — Encounter: Payer: Self-pay | Admitting: Urology

## 2019-10-09 ENCOUNTER — Telehealth: Payer: Self-pay

## 2019-10-09 VITALS — BP 153/79 | HR 75 | Temp 98.3°F

## 2019-10-09 DIAGNOSIS — N401 Enlarged prostate with lower urinary tract symptoms: Secondary | ICD-10-CM | POA: Diagnosis not present

## 2019-10-09 DIAGNOSIS — R350 Frequency of micturition: Secondary | ICD-10-CM

## 2019-10-09 DIAGNOSIS — N200 Calculus of kidney: Secondary | ICD-10-CM | POA: Diagnosis not present

## 2019-10-09 DIAGNOSIS — N210 Calculus in bladder: Secondary | ICD-10-CM | POA: Diagnosis not present

## 2019-10-09 DIAGNOSIS — N138 Other obstructive and reflux uropathy: Secondary | ICD-10-CM

## 2019-10-09 LAB — POCT URINALYSIS DIPSTICK
Glucose, UA: NEGATIVE
Ketones, UA: NEGATIVE
Leukocytes, UA: NEGATIVE
Nitrite, UA: NEGATIVE
Protein, UA: POSITIVE — AB
Spec Grav, UA: >=1.03 — AB (ref 1.010–1.025)
Urobilinogen, UA: NEGATIVE E.U./dL — AB
pH, UA: 5 (ref 5.0–8.0)

## 2019-10-09 MED ORDER — TAMSULOSIN HCL 0.4 MG PO CAPS: 0.4000 mg | ORAL_CAPSULE | Freq: Every day | ORAL | 11 refills | Status: DC

## 2019-10-09 NOTE — Progress Notes (Signed)
Subjective:  1. Bladder stone   2. Renal stones   3. BPH with urinary obstruction   4. Urinary frequency      [name] returns with the onset a month ago of left groin pain.  The pain has been sharp intermittently and it is worse with voiding.  He has had no hematuria but he has had dark urine.   He has no nausea with the pain.  He has some increased frequency but no nocturia.  He has small voids.  He has a good stream.   He had a CT angiogram in 12/20 that showed a 2.2cm bladder stone.  On review of a KUB from 2019, the stone appears to be about 1.5cm and over the sacrum.  On a KUB prior to this visit it measures about 2.5 x 2.6cm and there are small LLP stones.   He has some BPH with BOO and has had a good response to tamsulosin in the past but his LUTS haven't been too severe.   This is not his first kidney stone. He has had more than 5 stones prior to getting this one. He is not currently having flank pain, back pain, groin pain, nausea, vomiting, fever or chills. He has caught a stone in his urine strainer since his symptoms began.   He has had eswl, ureteral stent, and ureteroscopy for treatment of his stones in the past.      ROS:  ROS:  A complete review of systems was performed.  All systems are negative except for pertinent findings as noted.   Review of Systems  Respiratory: Negative for shortness of breath.   Cardiovascular: Negative for chest pain.  Gastrointestinal: Negative for nausea.  All other systems reviewed and are negative.   Allergies  Allergen Reactions  . Meperidine Hcl Other (See Comments)    Was mixed with phenergan and turned blood vessels red.   . Promethazine Hcl Other (See Comments)    Was mixed with the Demerol and caused veins to turn red.     Outpatient Encounter Medications as of 10/09/2019  Medication Sig Note  . aspirin EC 81 MG tablet Take 81 mg by mouth daily.   Marland Kitchen atorvastatin (LIPITOR) 40 MG tablet Take 1 tablet (40 mg total) by mouth daily.    . metoprolol tartrate (LOPRESSOR) 25 MG tablet Take 1 tablet (25 mg total) by mouth 2 (two) times daily.   . pioglitazone-metformin (ACTOPLUS MET) 15-850 MG tablet Take 1 tablet by mouth 2 (two) times daily. 08/22/2015: Confirmed with patient and pharmacy currently taking metformin and actoplus met. Pt to see Dr. Collene Mares and follow up in a week.   . ramipril (ALTACE) 10 MG capsule TAKE 2 CAPSULES BY MOUTH EVERY DAY WITH BREAKFAST   . tadalafil (CIALIS) 20 MG tablet Take 1 tablet as directed 30 min prior to intercourse.   . tamsulosin (FLOMAX) 0.4 MG CAPS capsule Take 1 capsule (0.4 mg total) by mouth daily.    No facility-administered encounter medications on file as of 10/09/2019.    Past Medical History:  Diagnosis Date  . AAA (abdominal aortic aneurysm) (HCC)    3.3 cm in 2012  . ABDOMINAL AORTIC ANEURYSM 11/07/2009   Diameter of 3.3 cm by CT in 12/2010   . Anxiety   . ASCVD (arteriosclerotic cardiovascular disease)    LAD stent in 1999; RCA bare-metal stent in 12/2007, LAD stent patent, 50-60% proximal Cx lesion treated medically; 09/2012: CABG x3, LIMA to LAD, Sequential SVG to ramus  intermediate and OM branch with EVH via right thigh  . Coronary artery disease   . Depression   . Diabetes mellitus   . DIABETES MELLITUS, TYPE II 08/09/2010   no insulin    . ED (erectile dysfunction)   . Gastric ulcer   . Hepatic steatosis   . Hyperlipidemia   . HYPERLIPIDEMIA 08/09/2010   Lipid profile in 07/2010:144, 124, 34, 86; 01/2012:93, 82, 34, 43   . Insomnia   . MI (myocardial infarction) ([place])   . Nephrolithiasis   . NEPHROLITHIASIS, RECURRENT 08/09/2010   Obstruction of the ureter on CT scan of the abdomen resulting in right hydronephrosis in 12/2010; bladder outlet obstruction prior to that.  CT also revealed atherosclerotic changes in the abdominal arterial vessels, hepatic granuloma and calcification of the prostate.   . S/P CABG x 3 10/07/2012   LIMA to LAD, Sequential SVG to ramus intermediate and  OM branch with EVH via right thigh  . THROMBOCYTOPENIA 08/09/2010   Platelets 88-114,000 over the past 4 years; prior evaluation by Dr. Tressie Stalker without a specific etiology identified.  01/2012: Normal CBC ex platelets-120   . Thrombocytopenia ([place])    Chronic; evaluated by Dr. Tressie Stalker in 2007  . Tobacco abuse     Past Surgical History:  Procedure Laterality Date  . [place], 2009  . CORONARY ARTERY BYPASS GRAFT N/A 10/07/2012   Procedure: CORONARY ARTERY BYPASS GRAFTING (CABG);  Surgeon: Rexene Alberts, MD;  Location: [place];  Service: Open Heart Surgery;  Laterality: N/A;  . CYSTOSCOPY WITH RETROGRADE PYELOGRAM, URETEROSCOPY AND STENT PLACEMENT Left 08/30/2015   Procedure: CYSTOSCOPY LEFT RETROGRADE PYELOGRAM  LEFT   URETEROSCOPY,  LITHOLAPAXY;  Surgeon: Irine Seal, MD;  [facility];  Service: Urology;  Laterality: Left;  . ESOPHAGOGASTRODUODENOSCOPY  04/04/2012   Procedure: ESOPHAGOGASTRODUODENOSCOPY (EGD);  Surgeon: Rogene Houston, MD;  [facility];  Service: Endoscopy;  Laterality: N/A;  830  . ESOPHAGOGASTRODUODENOSCOPY  07/25/2012   Procedure: ESOPHAGOGASTRODUODENOSCOPY (EGD);  Surgeon: Rogene Houston, MD;  [facility];  Service: Endoscopy;  Laterality: N/A;  830  . HOLMIUM LASER APPLICATION Left 4/81/8563   Procedure: HOLMIUM LASER APPLICATION;  Surgeon: Irine Seal, MD;  [facility];  Service: Urology;  Laterality: Left;  . INGUINAL HERNIA REPAIR    . INTRAOPERATIVE TRANSESOPHAGEAL ECHOCARDIOGRAM N/A 10/07/2012   Procedure: INTRAOPERATIVE TRANSESOPHAGEAL ECHOCARDIOGRAM;  Surgeon: Rexene Alberts, MD;  Location: [place];  Service: Open Heart Surgery;  Laterality: N/A;  . LEFT HEART CATHETERIZATION WITH CORONARY ANGIOGRAM N/A 10/03/2012   Procedure: LEFT HEART CATHETERIZATION WITH CORONARY ANGIOGRAM;  Surgeon: Minus Breeding, MD;  Location: [facility] C[facility];  Service: Cardiovascular;  Laterality: N/A;  . LITHOTRIPSY       Social History   Socioeconomic History  . Marital status: Married    Spouse name: Not on file  . Number of children: Not on file  . Years of education: Not on file  . Highest education level: Not on file  Occupational History  . Not on file  Tobacco Use  . Smoking status: Current Some Day Smoker    Packs/day: 0.50    Years: 40.00    Pack years: 20.00    Types: Cigarettes    Last attempt to quit: 07/09/2013    Years since quitting: 6.2  . Smokeless tobacco: Never Used  . Tobacco comment: 6-7 cigs. daily   Substance and Sexual Activity  . Alcohol use:  No    Alcohol/week: 0.0 standard drinks  . Drug use: No  . Sexual activity: Not on file  Other Topics Concern  . Not on file  Social History Narrative   No regular exercise   Social Determinants of Health   Financial Resource Strain:   . Difficulty of Paying Living Expenses:   Food Insecurity:   . Worried About Charity fundraiser in the Last Year:   . Arboriculturist in the Last Year:   Transportation Needs:   . Film/video editor (Medical):   Marland Kitchen Lack of Transportation (Non-Medical):   Physical Activity:   . Days of Exercise per Week:   . Minutes of Exercise per Session:   Stress:   . Feeling of Stress :   Social Connections:   . Frequency of Communication with Friends and Family:   . Frequency of Social Gatherings with Friends and Family:   . Attends Religious Services:   . Active Member of Clubs or Organizations:   . Attends Archivist Meetings:   Marland Kitchen Marital Status:   Intimate Partner Violence:   . Fear of Current or Ex-Partner:   . Emotionally Abused:   Marland Kitchen Physically Abused:   . Sexually Abused:     Family History  Problem Relation Age of Onset  . Ovarian cancer Sister        Objective: Vitals:   10/09/19 1414  BP: (!) 153/79  Pulse: 75  Temp: 98.3 F (36.8 C)     Physical Exam Vitals reviewed.  Constitutional:      Appearance: Normal appearance.  Cardiovascular:     Rate  and Rhythm: Normal rate and regular rhythm.     Heart sounds: Normal heart sounds.  Pulmonary:     Effort: Pulmonary effort is normal. No respiratory distress.     Breath sounds: Normal breath sounds.  Musculoskeletal:        General: Normal range of motion.     Right lower leg: No edema.     Left lower leg: No edema.  Neurological:     Mental Status: He is alert.     Lab Results:  Results for orders placed or performed in visit on 10/09/19 (from the past 24 hour(s))  POCT urinalysis dipstick     Status: Abnormal   Collection Time: 10/09/19  2:24 PM  Result Value Ref Range   Color, UA yellow    Clarity, UA cloudy    Glucose, UA Negative Negative   Bilirubin, UA small    Ketones, UA neg    Spec Grav, UA >=1.030 (A) 1.010 - 1.025   Blood, UA large    pH, UA 5.0 5.0 - 8.0   Protein, UA Positive (A) Negative   Urobilinogen, UA negative (A) 0.2 or 1.0 E.U./dL   Nitrite, UA neg    Leukocytes, UA Negative Negative   Appearance clear    Odor      BMET No results for input(s): NA, K, CL, CO2, GLUCOSE, BUN, CREATININE, CALCIUM in the last 72 hours. PSA No results found for: PSA No results found for: TESTOSTERONE    Studies/Results: DG Abd 1 View  Result Date: 10/08/2019 CLINICAL DATA:  Kidney stones. EXAM: ABDOMEN - 1 VIEW COMPARISON:  CT 06/03/2019. FINDINGS: Soft tissue structures are unremarkable. Stable calcific densities over the left kidney consistent with left nephrolithiasis. Stable prominent calcific density noted over the bladder consistent with prominent bladder stone. No evidence of ureteral stone. Stable calcific densities in  the pelvis consistent phleboliths. No bowel distention. Stool noted throughout the colon. Degenerative changes lumbar spine and both hips. Stable sclerotic density noted over the left ilium consistent with bone island. IMPRESSION: Stable calcific densities over the left kidney consistent with left nephrolithiasis. Stable prominent calcific density  noted the bladder consistent prominent bladder stone. No evidence of ureteral stone. Electronically Signed   By: Marcello Moores  Register   On: 10/08/2019 13:34   I have reviewed his CT and kUB films and reviewed recent records in [place].   Assessment & Plan:  He has a 2.6cm bladder stone that has enlarged since December and he is now symptomatic.  I am going to get him set up for a cystolithalopaxy and reviewed the risks of bleeding, infection, bladder and urethral injury, retention, need for secondary procedures, thrombotic events and anesthetic risks.  I will get him cleared by cardiology.  BPH with BOO with mild LUTS.  I am going to put him back on tamsulosin which will reduce the risk of retention after the cystolithalopaxy, but I will not plan on a prostate procedure at this time.   Meds ordered this encounter  Medications  . tamsulosin (FLOMAX) 0.4 MG CAPS capsule    Sig: Take 1 capsule (0.4 mg total) by mouth daily.    Dispense:  30 capsule    Refill:  11     Orders Placed This Encounter  Procedures  . POCT urinalysis dipstick      Return for Next available  I will get him scheduled for cystolithalopaxy. .   CC: Redmond School, MD      Irine Seal 10/09/2019

## 2019-10-09 NOTE — Telephone Encounter (Signed)
Left message for pt to be seen today at 3:45. Awaiting pt to return call

## 2019-10-09 NOTE — H&P (View-Only) (Signed)
Subjective:  1. Bladder stone   2. Renal stones   3. BPH with urinary obstruction   4. Urinary frequency      [name] returns with the onset a month ago of left groin pain.  The pain has been sharp intermittently and it is worse with voiding.  He has had no hematuria but he has had dark urine.   He has no nausea with the pain.  He has some increased frequency but no nocturia.  He has small voids.  He has a good stream.   He had a CT angiogram in 12/20 that showed a 2.2cm bladder stone.  On review of a KUB from 2019, the stone appears to be about 1.5cm and over the sacrum.  On a KUB prior to this visit it measures about 2.5 x 2.6cm and there are small LLP stones.   He has some BPH with BOO and has had a good response to tamsulosin in the past but his LUTS haven't been too severe.   This is not his first kidney stone. He has had more than 5 stones prior to getting this one. He is not currently having flank pain, back pain, groin pain, nausea, vomiting, fever or chills. He has caught a stone in his urine strainer since his symptoms began.   He has had eswl, ureteral stent, and ureteroscopy for treatment of his stones in the past.      ROS:  ROS:  A complete review of systems was performed.  All systems are negative except for pertinent findings as noted.   Review of Systems  Respiratory: Negative for shortness of breath.   Cardiovascular: Negative for chest pain.  Gastrointestinal: Negative for nausea.  All other systems reviewed and are negative.   Allergies  Allergen Reactions  . Meperidine Hcl Other (See Comments)    Was mixed with phenergan and turned blood vessels red.   . Promethazine Hcl Other (See Comments)    Was mixed with the Demerol and caused veins to turn red.     Outpatient Encounter Medications as of 10/09/2019  Medication Sig Note  . aspirin EC 81 MG tablet Take 81 mg by mouth daily.   Marland Kitchen atorvastatin (LIPITOR) 40 MG tablet Take 1 tablet (40 mg total) by mouth daily.    . metoprolol tartrate (LOPRESSOR) 25 MG tablet Take 1 tablet (25 mg total) by mouth 2 (two) times daily.   . pioglitazone-metformin (ACTOPLUS MET) 15-850 MG tablet Take 1 tablet by mouth 2 (two) times daily. 08/22/2015: Confirmed with patient and pharmacy currently taking metformin and actoplus met. Pt to see Dr. Collene Mares and follow up in a week.   . ramipril (ALTACE) 10 MG capsule TAKE 2 CAPSULES BY MOUTH EVERY DAY WITH BREAKFAST   . tadalafil (CIALIS) 20 MG tablet Take 1 tablet as directed 30 min prior to intercourse.   . tamsulosin (FLOMAX) 0.4 MG CAPS capsule Take 1 capsule (0.4 mg total) by mouth daily.    No facility-administered encounter medications on file as of 10/09/2019.    Past Medical History:  Diagnosis Date  . AAA (abdominal aortic aneurysm) (HCC)    3.3 cm in 2012  . ABDOMINAL AORTIC ANEURYSM 11/07/2009   Diameter of 3.3 cm by CT in 12/2010   . Anxiety   . ASCVD (arteriosclerotic cardiovascular disease)    LAD stent in 1999; RCA bare-metal stent in 12/2007, LAD stent patent, 50-60% proximal Cx lesion treated medically; 09/2012: CABG x3, LIMA to LAD, Sequential SVG to ramus  intermediate and OM branch with EVH via right thigh  . Coronary artery disease   . Depression   . Diabetes mellitus   . DIABETES MELLITUS, TYPE II 08/09/2010   no insulin    . ED (erectile dysfunction)   . Gastric ulcer   . Hepatic steatosis   . Hyperlipidemia   . HYPERLIPIDEMIA 08/09/2010   Lipid profile in 07/2010:144, 124, 34, 86; 01/2012:93, 82, 34, 43   . Insomnia   . MI (myocardial infarction) ([place])   . Nephrolithiasis   . NEPHROLITHIASIS, RECURRENT 08/09/2010   Obstruction of the ureter on CT scan of the abdomen resulting in right hydronephrosis in 12/2010; bladder outlet obstruction prior to that.  CT also revealed atherosclerotic changes in the abdominal arterial vessels, hepatic granuloma and calcification of the prostate.   . S/P CABG x 3 10/07/2012   LIMA to LAD, Sequential SVG to ramus intermediate and  OM branch with EVH via right thigh  . THROMBOCYTOPENIA 08/09/2010   Platelets 88-114,000 over the past 4 years; prior evaluation by Dr. Tressie Stalker without a specific etiology identified.  01/2012: Normal CBC ex platelets-120   . Thrombocytopenia ([place])    Chronic; evaluated by Dr. Tressie Stalker in 2007  . Tobacco abuse     Past Surgical History:  Procedure Laterality Date  . [place], 2009  . CORONARY ARTERY BYPASS GRAFT N/A 10/07/2012   Procedure: CORONARY ARTERY BYPASS GRAFTING (CABG);  Surgeon: Rexene Alberts, MD;  Location: [place];  Service: Open Heart Surgery;  Laterality: N/A;  . CYSTOSCOPY WITH RETROGRADE PYELOGRAM, URETEROSCOPY AND STENT PLACEMENT Left 08/30/2015   Procedure: CYSTOSCOPY LEFT RETROGRADE PYELOGRAM  LEFT   URETEROSCOPY,  LITHOLAPAXY;  Surgeon: Irine Seal, MD;  [facility];  Service: Urology;  Laterality: Left;  . ESOPHAGOGASTRODUODENOSCOPY  04/04/2012   Procedure: ESOPHAGOGASTRODUODENOSCOPY (EGD);  Surgeon: Rogene Houston, MD;  [facility];  Service: Endoscopy;  Laterality: N/A;  830  . ESOPHAGOGASTRODUODENOSCOPY  07/25/2012   Procedure: ESOPHAGOGASTRODUODENOSCOPY (EGD);  Surgeon: Rogene Houston, MD;  [facility];  Service: Endoscopy;  Laterality: N/A;  830  . HOLMIUM LASER APPLICATION Left 02/25/785   Procedure: HOLMIUM LASER APPLICATION;  Surgeon: Irine Seal, MD;  [facility];  Service: Urology;  Laterality: Left;  . INGUINAL HERNIA REPAIR    . INTRAOPERATIVE TRANSESOPHAGEAL ECHOCARDIOGRAM N/A 10/07/2012   Procedure: INTRAOPERATIVE TRANSESOPHAGEAL ECHOCARDIOGRAM;  Surgeon: Rexene Alberts, MD;  Location: [place];  Service: Open Heart Surgery;  Laterality: N/A;  . LEFT HEART CATHETERIZATION WITH CORONARY ANGIOGRAM N/A 10/03/2012   Procedure: LEFT HEART CATHETERIZATION WITH CORONARY ANGIOGRAM;  Surgeon: Minus Breeding, MD;  Location: [facility] C[facility] LAB;  Service: Cardiovascular;  Laterality: N/A;  . LITHOTRIPSY       Social History   Socioeconomic History  . Marital status: Married    Spouse name: Not on file  . Number of children: Not on file  . Years of education: Not on file  . Highest education level: Not on file  Occupational History  . Not on file  Tobacco Use  . Smoking status: Current Some Day Smoker    Packs/day: 0.50    Years: 40.00    Pack years: 20.00    Types: Cigarettes    Last attempt to quit: 07/09/2013    Years since quitting: 6.2  . Smokeless tobacco: Never Used  . Tobacco comment: 6-7 cigs. daily   Substance and Sexual Activity  . Alcohol use:  No    Alcohol/week: 0.0 standard drinks  . Drug use: No  . Sexual activity: Not on file  Other Topics Concern  . Not on file  Social History Narrative   No regular exercise   Social Determinants of Health   Financial Resource Strain:   . Difficulty of Paying Living Expenses:   Food Insecurity:   . Worried About Charity fundraiser in the Last Year:   . Arboriculturist in the Last Year:   Transportation Needs:   . Film/video editor (Medical):   Marland Kitchen Lack of Transportation (Non-Medical):   Physical Activity:   . Days of Exercise per Week:   . Minutes of Exercise per Session:   Stress:   . Feeling of Stress :   Social Connections:   . Frequency of Communication with Friends and Family:   . Frequency of Social Gatherings with Friends and Family:   . Attends Religious Services:   . Active Member of Clubs or Organizations:   . Attends Archivist Meetings:   Marland Kitchen Marital Status:   Intimate Partner Violence:   . Fear of Current or Ex-Partner:   . Emotionally Abused:   Marland Kitchen Physically Abused:   . Sexually Abused:     Family History  Problem Relation Age of Onset  . Ovarian cancer Sister        Objective: Vitals:   10/09/19 1414  BP: (!) 153/79  Pulse: 75  Temp: 98.3 F (36.8 C)     Physical Exam Vitals reviewed.  Constitutional:      Appearance: Normal appearance.  Cardiovascular:     Rate  and Rhythm: Normal rate and regular rhythm.     Heart sounds: Normal heart sounds.  Pulmonary:     Effort: Pulmonary effort is normal. No respiratory distress.     Breath sounds: Normal breath sounds.  Musculoskeletal:        General: Normal range of motion.     Right lower leg: No edema.     Left lower leg: No edema.  Neurological:     Mental Status: He is alert.     Lab Results:  Results for orders placed or performed in visit on 10/09/19 (from the past 24 hour(s))  POCT urinalysis dipstick     Status: Abnormal   Collection Time: 10/09/19  2:24 PM  Result Value Ref Range   Color, UA yellow    Clarity, UA cloudy    Glucose, UA Negative Negative   Bilirubin, UA small    Ketones, UA neg    Spec Grav, UA >=1.030 (A) 1.010 - 1.025   Blood, UA large    pH, UA 5.0 5.0 - 8.0   Protein, UA Positive (A) Negative   Urobilinogen, UA negative (A) 0.2 or 1.0 E.U./dL   Nitrite, UA neg    Leukocytes, UA Negative Negative   Appearance clear    Odor      BMET No results for input(s): NA, K, CL, CO2, GLUCOSE, BUN, CREATININE, CALCIUM in the last 72 hours. PSA No results found for: PSA No results found for: TESTOSTERONE    Studies/Results: DG Abd 1 View  Result Date: 10/08/2019 CLINICAL DATA:  Kidney stones. EXAM: ABDOMEN - 1 VIEW COMPARISON:  CT 06/03/2019. FINDINGS: Soft tissue structures are unremarkable. Stable calcific densities over the left kidney consistent with left nephrolithiasis. Stable prominent calcific density noted over the bladder consistent with prominent bladder stone. No evidence of ureteral stone. Stable calcific densities in  the pelvis consistent phleboliths. No bowel distention. Stool noted throughout the colon. Degenerative changes lumbar spine and both hips. Stable sclerotic density noted over the left ilium consistent with bone island. IMPRESSION: Stable calcific densities over the left kidney consistent with left nephrolithiasis. Stable prominent calcific density  noted the bladder consistent prominent bladder stone. No evidence of ureteral stone. Electronically Signed   By: Marcello Moores  Register   On: 10/08/2019 13:34   I have reviewed his CT and kUB films and reviewed recent records in [place].   Assessment & Plan:  He has a 2.6cm bladder stone that has enlarged since December and he is now symptomatic.  I am going to get him set up for a cystolithalopaxy and reviewed the risks of bleeding, infection, bladder and urethral injury, retention, need for secondary procedures, thrombotic events and anesthetic risks.  I will get him cleared by cardiology.  BPH with BOO with mild LUTS.  I am going to put him back on tamsulosin which will reduce the risk of retention after the cystolithalopaxy, but I will not plan on a prostate procedure at this time.   Meds ordered this encounter  Medications  . tamsulosin (FLOMAX) 0.4 MG CAPS capsule    Sig: Take 1 capsule (0.4 mg total) by mouth daily.    Dispense:  30 capsule    Refill:  11     Orders Placed This Encounter  Procedures  . POCT urinalysis dipstick      Return for Next available  I will get him scheduled for cystolithalopaxy. .   CC: Redmond School, MD      Irine Seal 10/09/2019

## 2019-10-09 NOTE — Telephone Encounter (Signed)
-----   Message from Irine Seal, MD sent at 10/08/2019  5:56 PM EDT ----- His bladder stone is a lot bigger than in the past.  Does he have f/u to see me?   I don't see an appointment in Epic.  ----- Message ----- From: Dorisann Frames, RN Sent: 10/08/2019   1:46 PM EDT To: Irine Seal, MD  Kub to review

## 2019-10-12 ENCOUNTER — Other Ambulatory Visit: Payer: Self-pay | Admitting: Urology

## 2019-10-23 NOTE — Patient Instructions (Addendum)
DUE TO COVID-19 ONLY ONE VISITOR IS ALLOWED TO COME WITH YOU AND STAY IN THE WAITING ROOM ONLY DURING PRE OP AND PROCEDURE DAY OF SURGERY. THE 1 VISITOR MAY VISIT WITH YOU AFTER SURGERY IN YOUR PRIVATE ROOM DURING VISITING HOURS ONLY!  YOU NEED TO HAVE A COVID 19 TEST ON: 10/26/19 @ 2:10 PM, THIS TEST MUST BE DONE BEFORE SURGERY, COME  [place], [place] , [id number].  ([place]) ONCE YOUR COVID TEST IS COMPLETED, PLEASE BEGIN THE QUARANTINE INSTRUCTIONS AS OUTLINED IN YOUR HANDOUT.                [name]    Your procedure is scheduled on: 10/29/19   Report to [facility] Main  Entrance   Report to admitting at: 8:00 AM     Call this number if you have problems the morning of surgery [phone]    Remember: Do not eat solid food :After Midnight. Clear liquids from midnight until 7:00 am    CLEAR LIQUID DIET   Foods Allowed                                                                     Foods Excluded  Coffee and tea, regular and decaf                             liquids that you cannot  Plain Jell-O any favor except red or purple                                           see through such as: Fruit ices (not with fruit pulp)                                     milk, soups, orange juice  Iced Popsicles                                    All solid food Carbonated beverages, regular and diet                                    Cranberry, grape and apple juices Sports drinks like Gatorade Lightly seasoned clear broth or consume(fat free) Sugar, honey syrup  Sample Menu Breakfast                                Lunch                                     Supper Cranberry juice                    Beef broth  Chicken broth Jell-O                                     Grape juice                           Apple juice Coffee or tea                        Jell-O                                      Popsicle                                                 Coffee or tea                        Coffee or tea  _____________________________________________________________________   BRUSH YOUR TEETH MORNING OF SURGERY AND RINSE YOUR MOUTH OUT, NO CHEWING GUM CANDY OR MINTS.     Take these medicines the morning of surgery with A SIP OF WATER: loratadine,metoprolol,tamsulosin.  How to Manage Your Diabetes Before and After Surgery  Why is it important to control my blood sugar before and after surgery? . Improving blood sugar levels before and after surgery helps healing and can limit problems. . A way of improving blood sugar control is eating a healthy diet by: o  Eating less sugar and carbohydrates o  Increasing activity/exercise o  Talking with your doctor about reaching your blood sugar goals . High blood sugars (greater than 180 mg/dL) can raise your risk of infections and slow your recovery, so you will need to focus on controlling your diabetes during the weeks before surgery. . Make sure that the doctor who takes care of your diabetes knows about your planned surgery including the date and location.  How do I manage my blood sugar before surgery? . Check your blood sugar at least 4 times a day, starting 2 days before surgery, to make sure that the level is not too high or low. o Check your blood sugar the morning of your surgery when you wake up and every 2 hours until you get to the Short Stay unit. . If your blood sugar is less than 70 mg/dL, you will need to treat for low blood sugar: o Do not take insulin. o Treat a low blood sugar (less than 70 mg/dL) with  cup of clear juice (cranberry or apple), 4 glucose tablets, OR glucose gel. o Recheck blood sugar in 15 minutes after treatment (to make sure it is greater than 70 mg/dL). If your blood sugar is not greater than 70 mg/dL on recheck, call [phone] for further instructions. . Report your blood sugar to the short stay nurse when you get to Short  Stay.  . If you are admitted to the hospital after surgery: o Your blood sugar will be checked by the staff and you will probably be given insulin after surgery (instead of oral diabetes medicines) to make sure you have good blood sugar levels. o The goal for blood sugar control after surgery is 80-180  mg/dL.   WHAT DO I DO ABOUT MY DIABETES MEDICATION?  Marland Kitchen Do not take oral diabetes medicines (pills) the morning of surgery.  . THE DAY BEFORE SURGERY, take metformin as usual.     . THE MORNING OF SURGERY, DO NOT take metformin.                                You may not have any metal on your body including hair pins and              piercings  Do not wear jewelry, lotions, powders or perfumes, deodorant             Men may shave face and neck.   Do not bring valuables to the hospital. [place].  Contacts, dentures or bridgework may not be worn into surgery.  Leave suitcase in the car. After surgery it may be brought to your room.     Patients discharged the day of surgery will not be allowed to drive home. IF YOU ARE HAVING SURGERY AND GOING HOME THE SAME DAY, YOU MUST HAVE AN ADULT TO DRIVE YOU HOME AND BE WITH YOU FOR 24 HOURS. YOU MAY GO HOME BY TAXI OR UBER OR ORTHERWISE, BUT AN ADULT MUST ACCOMPANY YOU HOME AND STAY WITH YOU FOR 24 HOURS.  Name and phone number of your driver:  Special Instructions: N/A              Please read over the following fact sheets you were given: _____________________________________________________________________             [facility] - Preparing for Surgery Before surgery, you can play an important role.  Because skin is not sterile, your skin needs to be as free of germs as possible.  You can reduce the number of germs on your skin by washing with CHG (chlorahexidine gluconate) soap before surgery.  CHG is an antiseptic cleaner which kills germs and bonds with the skin to continue killing  germs even after washing. Please DO NOT use if you have an allergy to CHG or antibacterial soaps.  If your skin becomes reddened/irritated stop using the CHG and inform your nurse when you arrive at Short Stay. Do not shave (including legs and underarms) for at least 48 hours prior to the first CHG shower.  You may shave your face/neck. Please follow these instructions carefully:  1.  Shower with CHG Soap the night before surgery and the  morning of Surgery.  2.  If you choose to wash your hair, wash your hair first as usual with your  normal  shampoo.  3.  After you shampoo, rinse your hair and body thoroughly to remove the  shampoo.                           4.  Use CHG as you would any other liquid soap.  You can apply chg directly  to the skin and wash                       Gently with a scrungie or clean washcloth.  5.  Apply the CHG Soap to your body ONLY FROM THE NECK DOWN.   Do not use on face/ open  Wound or open sores. Avoid contact with eyes, ears mouth and genitals (private parts).                       Wash face,  Genitals (private parts) with your normal soap.             6.  Wash thoroughly, paying special attention to the area where your surgery  will be performed.  7.  Thoroughly rinse your body with warm water from the neck down.  8.  DO NOT shower/wash with your normal soap after using and rinsing off  the CHG Soap.                9.  Pat yourself dry with a clean towel.            10.  Wear clean pajamas.            11.  Place clean sheets on your bed the night of your first shower and do not  sleep with pets. Day of Surgery : Do not apply any lotions/deodorants the morning of surgery.  Please wear clean clothes to the hospital/surgery center.  FAILURE TO FOLLOW THESE INSTRUCTIONS MAY RESULT IN THE CANCELLATION OF YOUR SURGERY PATIENT SIGNATURE_________________________________  NURSE  SIGNATURE__________________________________  ________________________________________________________________________

## 2019-10-26 ENCOUNTER — Other Ambulatory Visit (HOSPITAL_COMMUNITY)
Admission: RE | Admit: 2019-10-26 | Discharge: 2019-10-26 | Disposition: A | Discharge status: Home / Self Care | Payer: Medicare HMO | Source: Ambulatory Visit | Attending: Urology | Admitting: Urology

## 2019-10-26 ENCOUNTER — Encounter (HOSPITAL_COMMUNITY): Payer: Self-pay

## 2019-10-26 ENCOUNTER — Other Ambulatory Visit: Payer: Self-pay

## 2019-10-26 ENCOUNTER — Encounter (HOSPITAL_COMMUNITY)
Admission: RE | Admit: 2019-10-26 | Discharge: 2019-10-26 | Disposition: A | Discharge status: Home / Self Care | Payer: Medicare HMO | Source: Ambulatory Visit | Attending: Urology | Admitting: Urology

## 2019-10-26 DIAGNOSIS — E785 Hyperlipidemia, unspecified: Secondary | ICD-10-CM | POA: Insufficient documentation

## 2019-10-26 DIAGNOSIS — N401 Enlarged prostate with lower urinary tract symptoms: Secondary | ICD-10-CM | POA: Insufficient documentation

## 2019-10-26 DIAGNOSIS — Z79899 Other long term (current) drug therapy: Secondary | ICD-10-CM | POA: Insufficient documentation

## 2019-10-26 DIAGNOSIS — F17210 Nicotine dependence, cigarettes, uncomplicated: Secondary | ICD-10-CM | POA: Insufficient documentation

## 2019-10-26 DIAGNOSIS — Z01812 Encounter for preprocedural laboratory examination: Secondary | ICD-10-CM | POA: Diagnosis present

## 2019-10-26 DIAGNOSIS — N138 Other obstructive and reflux uropathy: Secondary | ICD-10-CM | POA: Insufficient documentation

## 2019-10-26 DIAGNOSIS — E119 Type 2 diabetes mellitus without complications: Secondary | ICD-10-CM | POA: Diagnosis not present

## 2019-10-26 DIAGNOSIS — I2510 Atherosclerotic heart disease of native coronary artery without angina pectoris: Secondary | ICD-10-CM | POA: Insufficient documentation

## 2019-10-26 DIAGNOSIS — N210 Calculus in bladder: Secondary | ICD-10-CM | POA: Diagnosis not present

## 2019-10-26 DIAGNOSIS — Z951 Presence of aortocoronary bypass graft: Secondary | ICD-10-CM | POA: Insufficient documentation

## 2019-10-26 DIAGNOSIS — I10 Essential (primary) hypertension: Secondary | ICD-10-CM | POA: Diagnosis not present

## 2019-10-26 DIAGNOSIS — I252 Old myocardial infarction: Secondary | ICD-10-CM | POA: Insufficient documentation

## 2019-10-26 DIAGNOSIS — Z20822 Contact with and (suspected) exposure to covid-19: Secondary | ICD-10-CM | POA: Diagnosis not present

## 2019-10-26 DIAGNOSIS — Z7982 Long term (current) use of aspirin: Secondary | ICD-10-CM | POA: Diagnosis not present

## 2019-10-26 LAB — CBC
HCT: 49.1 % (ref 39.0–52.0)
Hemoglobin: 15.5 g/dL (ref 13.0–17.0)
MCH: 29.9 pg (ref 26.0–34.0)
MCHC: 31.6 g/dL (ref 30.0–36.0)
MCV: 94.6 fL (ref 80.0–100.0)
Platelets: 96 10*3/uL — ABNORMAL LOW (ref 150–400)
RBC: 5.19 MIL/uL (ref 4.22–5.81)
RDW: 13.5 % (ref 11.5–15.5)
WBC: 7.3 10*3/uL (ref 4.0–10.5)
nRBC: 0 % (ref 0.0–0.2)

## 2019-10-26 LAB — BASIC METABOLIC PANEL
Anion gap: 7 (ref 5–15)
BUN: 17 mg/dL (ref 8–23)
CO2: 27 mmol/L (ref 22–32)
Calcium: 9.2 mg/dL (ref 8.9–10.3)
Chloride: 110 mmol/L (ref 98–111)
Creatinine, Ser: 1.24 mg/dL (ref 0.61–1.24)
GFR calc Af Amer: >60 mL/min (ref >60)
GFR calc non Af Amer: >60 mL/min (ref >60)
Glucose, Bld: 96 mg/dL (ref 70–99)
Potassium: 4.5 mmol/L (ref 3.5–5.1)
Sodium: 144 mmol/L (ref 135–145)

## 2019-10-26 LAB — HEMOGLOBIN A1C
Hgb A1c MFr Bld: 6.3 % — ABNORMAL HIGH (ref 4.8–5.6)
Mean Plasma Glucose: 134.11 mg/dL

## 2019-10-26 LAB — SARS CORONAVIRUS 2 (TAT 6-24 HRS): SARS Coronavirus 2: NEGATIVE

## 2019-10-26 LAB — GLUCOSE, CAPILLARY: Glucose-Capillary: 89 mg/dL (ref 70–99)

## 2019-10-26 NOTE — Progress Notes (Signed)
PCP - Dr. Gerarda Fraction L. LOV: 10/09/19. Cardiologist - Dr. Zandra Abts  Chest x-ray -  EKG -  Stress Test -  ECHO -  Cardiac Cath -  CT angio: 06/03/19 Sleep Study -  CPAP -   Fasting Blood Sugar - 123 Checks Blood Sugar ___1__ times a day  Blood Thinner Instructions: Aspirin Instructions: Last Dose: 10/24/19  Anesthesia review: HTN,AAA,CAD,DIA  Patient denies shortness of breath, fever, cough and chest pain at PAT appointment   Patient verbalized understanding of instructions that were given to them at the PAT appointment. Patient was also instructed that they will need to review over the PAT instructions again at home before surgery.

## 2019-10-27 NOTE — Progress Notes (Signed)
Labs results. Platelets count: 96.

## 2019-10-27 NOTE — Progress Notes (Signed)
Anesthesia Chart Review   Case: [id number] Date/Time: 10/29/19 0945   Procedure: CYSTOSCOPY WITH LITHOLAPAXY (N/A )   Anesthesia type: General   Pre-op diagnosis: BLADDER STONE   Location: [facility] / [facility]   Surgeons: Irine Seal, MD      DISCUSSION:68 y.o. current some day smoker (20 pack years) with h/o CAD (LAD stent 1999, RCA BMS 2009, CABG 2014), HLD, DM II, thrombocytopenia, AAA (4.5 cm AAA followed by vascular surgeon, last seen 06/04/2019 stable), bladder stone scheduled for above procedure 10/29/2019 with Dr. Irine Seal.   Pt last seen by cardiologist, Dr. Carlyle Dolly, 03/27/2019.  Per ov note CAD stable without symptoms, HTN well controlled, LDL at goal, PVCs asymptomatic.  1 year follow up recommended.  Clearance received from Dr. Harl Bowie, on chart.   Anticipate pt can proceed with planned procedure barring acute status change.   VS: BP (!) 156/81   Pulse 78   Temp 36.9 C (Oral)   Resp 18   Ht '5\' 11"'$  (1.803 m)   Wt 80.9 kg   BMI 24.88 kg/m   PROVIDERS: Redmond School, MD is PCP   Carlyle Dolly, MD is Cardiologist  LABS: Labs reviewed: Acceptable for surgery. (all labs ordered are listed, but only abnormal results are displayed)  Labs Reviewed  CBC - Abnormal; Notable for the following components:      Result Value   Platelets 96 (*)    All other components within normal limits  HEMOGLOBIN A1C - Abnormal; Notable for the following components:   Hgb A1c MFr Bld 6.3 (*)    All other components within normal limits  BASIC METABOLIC PANEL  GLUCOSE, CAPILLARY     IMAGES:   EKG: 03/27/2019 Rate 75 bpm Nonspecific ST depression Negative T-wavea Possible inferior ischemia   CV:  Past Medical History:  Diagnosis Date  . AAA (abdominal aortic aneurysm) (HCC)    3.3 cm in 2012  . ABDOMINAL AORTIC ANEURYSM 11/07/2009   Diameter of 3.3 cm by CT in 12/2010   . Anxiety   . ASCVD (arteriosclerotic cardiovascular disease)    LAD stent in 1999; RCA  bare-metal stent in 12/2007, LAD stent patent, 50-60% proximal Cx lesion treated medically; 09/2012: CABG x3, LIMA to LAD, Sequential SVG to ramus intermediate and OM branch with EVH via right thigh  . Coronary artery disease   . Depression   . Diabetes mellitus   . DIABETES MELLITUS, TYPE II 08/09/2010   no insulin    . ED (erectile dysfunction)   . Gastric ulcer   . Hepatic steatosis   . Hyperlipidemia   . HYPERLIPIDEMIA 08/09/2010   Lipid profile in 07/2010:144, 124, 34, 86; 01/2012:93, 82, 34, 43   . Insomnia   . MI (myocardial infarction) ([place])   . Nephrolithiasis   . NEPHROLITHIASIS, RECURRENT 08/09/2010   Obstruction of the ureter on CT scan of the abdomen resulting in right hydronephrosis in 12/2010; bladder outlet obstruction prior to that.  CT also revealed atherosclerotic changes in the abdominal arterial vessels, hepatic granuloma and calcification of the prostate.   . S/P CABG x 3 10/07/2012   LIMA to LAD, Sequential SVG to ramus intermediate and OM branch with EVH via right thigh  . THROMBOCYTOPENIA 08/09/2010   Platelets 88-114,000 over the past 4 years; prior evaluation by Dr. Tressie Stalker without a specific etiology identified.  01/2012: Normal CBC ex platelets-120   . Thrombocytopenia ([place])    Chronic; evaluated by Dr. Tressie Stalker in 2007  . Tobacco  abuse     Past Surgical History:  Procedure Laterality Date  . [place], 2009  . CORONARY ARTERY BYPASS GRAFT N/A 10/07/2012   Procedure: CORONARY ARTERY BYPASS GRAFTING (CABG);  Surgeon: Rexene Alberts, MD;  Location: [place];  Service: Open Heart Surgery;  Laterality: N/A;  . CYSTOSCOPY WITH RETROGRADE PYELOGRAM, URETEROSCOPY AND STENT PLACEMENT Left 08/30/2015   Procedure: CYSTOSCOPY LEFT RETROGRADE PYELOGRAM  LEFT   URETEROSCOPY,  LITHOLAPAXY;  Surgeon: Irine Seal, MD;  [facility];  Service: Urology;  Laterality: Left;  . ESOPHAGOGASTRODUODENOSCOPY  04/04/2012   Procedure:  ESOPHAGOGASTRODUODENOSCOPY (EGD);  Surgeon: Rogene Houston, MD;  [facility];  Service: Endoscopy;  Laterality: N/A;  830  . ESOPHAGOGASTRODUODENOSCOPY  07/25/2012   Procedure: ESOPHAGOGASTRODUODENOSCOPY (EGD);  Surgeon: Rogene Houston, MD;  [facility];  Service: Endoscopy;  Laterality: N/A;  830  . HOLMIUM LASER APPLICATION Left 7/89/7847   Procedure: HOLMIUM LASER APPLICATION;  Surgeon: Irine Seal, MD;  [facility];  Service: Urology;  Laterality: Left;  . INGUINAL HERNIA REPAIR    . INTRAOPERATIVE TRANSESOPHAGEAL ECHOCARDIOGRAM N/A 10/07/2012   Procedure: INTRAOPERATIVE TRANSESOPHAGEAL ECHOCARDIOGRAM;  Surgeon: Rexene Alberts, MD;  Location: [place];  Service: Open Heart Surgery;  Laterality: N/A;  . LEFT HEART CATHETERIZATION WITH CORONARY ANGIOGRAM N/A 10/03/2012   Procedure: LEFT HEART CATHETERIZATION WITH CORONARY ANGIOGRAM;  Surgeon: Minus Breeding, MD;  [facility];  Service: Cardiovascular;  Laterality: N/A;  . LITHOTRIPSY      MEDICATIONS: . aspirin EC 81 MG tablet  . atorvastatin (LIPITOR) 40 MG tablet  . loratadine (CLARITIN) 10 MG tablet  . metoprolol tartrate (LOPRESSOR) 25 MG tablet  . pioglitazone-metformin (ACTOPLUS MET) 15-850 MG tablet  . ramipril (ALTACE) 10 MG capsule  . tadalafil (CIALIS) 20 MG tablet  . tamsulosin (FLOMAX) 0.4 MG CAPS capsule   No current facility-administered medications for this encounter.    Maia Plan [facility] Pre-Surgical Testing [phone] 10/27/19  2:42 PM

## 2019-10-29 ENCOUNTER — Ambulatory Visit (HOSPITAL_COMMUNITY): Payer: Medicare HMO | Admitting: Physician Assistant

## 2019-10-29 ENCOUNTER — Encounter (HOSPITAL_COMMUNITY): Payer: Self-pay | Admitting: Urology

## 2019-10-29 ENCOUNTER — Ambulatory Visit (HOSPITAL_COMMUNITY)
Admission: RE | Admit: 2019-10-29 | Discharge: 2019-10-29 | Disposition: A | Discharge status: Home / Self Care | Payer: Medicare HMO | Attending: Urology | Admitting: Urology

## 2019-10-29 ENCOUNTER — Encounter (HOSPITAL_COMMUNITY)
Admission: RE | Disposition: A | Discharge status: Home / Self Care | Payer: Self-pay | Source: Home / Self Care | Attending: Urology

## 2019-10-29 ENCOUNTER — Ambulatory Visit (HOSPITAL_COMMUNITY): Payer: Medicare HMO | Admitting: Anesthesiology

## 2019-10-29 DIAGNOSIS — Z7984 Long term (current) use of oral hypoglycemic drugs: Secondary | ICD-10-CM | POA: Diagnosis not present

## 2019-10-29 DIAGNOSIS — Z79899 Other long term (current) drug therapy: Secondary | ICD-10-CM | POA: Diagnosis not present

## 2019-10-29 DIAGNOSIS — N210 Calculus in bladder: Secondary | ICD-10-CM | POA: Insufficient documentation

## 2019-10-29 DIAGNOSIS — R69 Illness, unspecified: Secondary | ICD-10-CM | POA: Diagnosis not present

## 2019-10-29 DIAGNOSIS — F17210 Nicotine dependence, cigarettes, uncomplicated: Secondary | ICD-10-CM | POA: Diagnosis not present

## 2019-10-29 DIAGNOSIS — E785 Hyperlipidemia, unspecified: Secondary | ICD-10-CM | POA: Insufficient documentation

## 2019-10-29 DIAGNOSIS — N529 Male erectile dysfunction, unspecified: Secondary | ICD-10-CM | POA: Insufficient documentation

## 2019-10-29 DIAGNOSIS — E119 Type 2 diabetes mellitus without complications: Secondary | ICD-10-CM | POA: Diagnosis not present

## 2019-10-29 DIAGNOSIS — Z951 Presence of aortocoronary bypass graft: Secondary | ICD-10-CM | POA: Diagnosis not present

## 2019-10-29 DIAGNOSIS — I252 Old myocardial infarction: Secondary | ICD-10-CM | POA: Insufficient documentation

## 2019-10-29 DIAGNOSIS — I2510 Atherosclerotic heart disease of native coronary artery without angina pectoris: Secondary | ICD-10-CM | POA: Insufficient documentation

## 2019-10-29 HISTORY — PX: CYSTOSCOPY WITH LITHOLAPAXY: SHX1425

## 2019-10-29 LAB — GLUCOSE, CAPILLARY
Glucose-Capillary: 133 mg/dL — ABNORMAL HIGH (ref 70–99)
Glucose-Capillary: 123 mg/dL — ABNORMAL HIGH (ref 70–99)

## 2019-10-29 SURGERY — CYSTOSCOPY, WITH BLADDER CALCULUS LITHOLAPAXY
Anesthesia: General

## 2019-10-29 MED ORDER — OXYCODONE HCL 5 MG PO TABS: 5.0000 mg | ORAL_TABLET | Freq: Once | ORAL | Status: DC | PRN

## 2019-10-29 MED ORDER — LACTATED RINGERS IV SOLN: INTRAVENOUS | Status: DC | PRN

## 2019-10-29 MED ORDER — ONDANSETRON HCL 4 MG/2ML IJ SOLN: 4.0000 mg | Freq: Once | INTRAMUSCULAR | Status: DC | PRN

## 2019-10-29 MED ORDER — PHENYLEPHRINE 40 MCG/ML (10ML) SYRINGE FOR IV PUSH (FOR BLOOD PRESSURE SUPPORT)
PREFILLED_SYRINGE | INTRAVENOUS | Status: AC
  Filled 2019-10-29: qty 10

## 2019-10-29 MED ORDER — CEFAZOLIN SODIUM-DEXTROSE 2-4 GM/100ML-% IV SOLN
2.0000 g | INTRAVENOUS | Status: AC
  Administered 2019-10-29: 2 g via INTRAVENOUS
  Filled 2019-10-29: qty 100

## 2019-10-29 MED ORDER — OXYCODONE HCL 5 MG/5ML PO SOLN: 5.0000 mg | Freq: Once | ORAL | Status: DC | PRN

## 2019-10-29 MED ORDER — ACETAMINOPHEN 500 MG PO TABS
1000.0000 mg | ORAL_TABLET | Freq: Once | ORAL | Status: AC
  Administered 2019-10-29: 1000 mg via ORAL
  Filled 2019-10-29: qty 2

## 2019-10-29 MED ORDER — ESMOLOL HCL 100 MG/10ML IV SOLN
INTRAVENOUS | Status: AC
  Filled 2019-10-29: qty 10

## 2019-10-29 MED ORDER — ONDANSETRON HCL 4 MG/2ML IJ SOLN
INTRAMUSCULAR | Status: AC
  Filled 2019-10-29: qty 2

## 2019-10-29 MED ORDER — EPHEDRINE SULFATE 50 MG/ML IJ SOLN
INTRAMUSCULAR | Status: DC | PRN
Start: 2019-10-29 — End: 2019-10-29
  Administered 2019-10-29 (×2): 10 mg via INTRAVENOUS
  Administered 2019-10-29: 5 mg via INTRAVENOUS

## 2019-10-29 MED ORDER — MIDAZOLAM HCL 5 MG/5ML IJ SOLN
INTRAMUSCULAR | Status: DC | PRN
  Administered 2019-10-29: 2 mg via INTRAVENOUS

## 2019-10-29 MED ORDER — SODIUM CHLORIDE 0.9% FLUSH: 3.0000 mL | Freq: Two times a day (BID) | INTRAVENOUS | Status: DC

## 2019-10-29 MED ORDER — FENTANYL CITRATE (PF) 100 MCG/2ML IJ SOLN
INTRAMUSCULAR | Status: DC | PRN
  Administered 2019-10-29: 50 ug via INTRAVENOUS
  Administered 2019-10-29 (×2): 25 ug via INTRAVENOUS

## 2019-10-29 MED ORDER — HYDROCODONE-ACETAMINOPHEN 5-325 MG PO TABS: 1.0000 | ORAL_TABLET | Freq: Four times a day (QID) | ORAL | 0 refills | Status: DC | PRN

## 2019-10-29 MED ORDER — ESMOLOL HCL 100 MG/10ML IV SOLN
INTRAVENOUS | Status: DC | PRN
  Administered 2019-10-29: 10 mg via INTRAVENOUS

## 2019-10-29 MED ORDER — FENTANYL CITRATE (PF) 100 MCG/2ML IJ SOLN
INTRAMUSCULAR | Status: AC
  Filled 2019-10-29: qty 2

## 2019-10-29 MED ORDER — PHENAZOPYRIDINE HCL 200 MG PO TABS
200.0000 mg | ORAL_TABLET | Freq: Three times a day (TID) | ORAL | 0 refills | Status: DC | PRN
Start: 2019-10-29 — End: 2021-01-19

## 2019-10-29 MED ORDER — PHENYLEPHRINE HCL (PRESSORS) 10 MG/ML IV SOLN
INTRAVENOUS | Status: DC | PRN
  Administered 2019-10-29 (×2): 80 ug via INTRAVENOUS

## 2019-10-29 MED ORDER — PROPOFOL 10 MG/ML IV BOLUS
INTRAVENOUS | Status: DC | PRN
  Administered 2019-10-29: 200 mg via INTRAVENOUS

## 2019-10-29 MED ORDER — LACTATED RINGERS IV SOLN: INTRAVENOUS | Status: DC

## 2019-10-29 MED ORDER — HYDROMORPHONE HCL 1 MG/ML IJ SOLN
0.2500 mg | INTRAMUSCULAR | Status: DC | PRN
  Administered 2019-10-29: 0.5 mg via INTRAVENOUS

## 2019-10-29 MED ORDER — HYDROMORPHONE HCL 1 MG/ML IJ SOLN
INTRAMUSCULAR | Status: AC
  Filled 2019-10-29: qty 1

## 2019-10-29 MED ORDER — MIDAZOLAM HCL 2 MG/2ML IJ SOLN
INTRAMUSCULAR | Status: AC
  Filled 2019-10-29: qty 2

## 2019-10-29 MED ORDER — PROPOFOL 10 MG/ML IV BOLUS
INTRAVENOUS | Status: AC
  Filled 2019-10-29: qty 20

## 2019-10-29 MED ORDER — SODIUM CHLORIDE 0.9 % IR SOLN
Status: DC | PRN
  Administered 2019-10-29: 3000 mL via INTRAVESICAL

## 2019-10-29 MED ORDER — LIDOCAINE HCL (CARDIAC) PF 100 MG/5ML IV SOSY
PREFILLED_SYRINGE | INTRAVENOUS | Status: DC | PRN
  Administered 2019-10-29: 80 mg via INTRATRACHEAL

## 2019-10-29 MED ORDER — EPHEDRINE 5 MG/ML INJ
INTRAVENOUS | Status: AC
  Filled 2019-10-29: qty 10

## 2019-10-29 MED ORDER — ONDANSETRON HCL 4 MG/2ML IJ SOLN
INTRAMUSCULAR | Status: DC | PRN
  Administered 2019-10-29: 4 mg via INTRAVENOUS

## 2019-10-29 SURGICAL SUPPLY — 15 items
BAG URO CATCHER STRL LF (MISCELLANEOUS) ×2 IMPLANT
CATH URET 5FR 28IN OPEN ENDED (CATHETERS) IMPLANT
CLOTH BEACON ORANGE TIMEOUT ST (SAFETY) ×2 IMPLANT
FIBER LASER FLEXIVA 1000 (UROLOGICAL SUPPLIES) ×1 IMPLANT
FIBER LASER FLEXIVA 550 (UROLOGICAL SUPPLIES) IMPLANT
GLOVE SURG SS PI 8.0 STRL IVOR (GLOVE) IMPLANT
GOWN STRL REUS W/TWL XL LVL3 (GOWN DISPOSABLE) ×2 IMPLANT
KIT TURNOVER KIT A (KITS) IMPLANT
MANIFOLD NEPTUNE II (INSTRUMENTS) ×2 IMPLANT
PENCIL SMOKE EVACUATOR (MISCELLANEOUS) IMPLANT
SYR TOOMEY IRRIG 70ML (MISCELLANEOUS)
SYRINGE TOOMEY IRRIG 70ML (MISCELLANEOUS) IMPLANT
TRAY CYSTO PACK (CUSTOM PROCEDURE TRAY) ×2 IMPLANT
TUBING CONNECTING 10 (TUBING) IMPLANT
TUBING UROLOGY SET (TUBING) ×2 IMPLANT

## 2019-10-29 NOTE — Interval H&P Note (Signed)
History and Physical Interval Note:  10/29/2019 9:19 AM  [name]  has presented today for surgery, with the diagnosis of BLADDER STONE.  The various methods of treatment have been discussed with the patient and family. After consideration of risks, benefits and other options for treatment, the patient has consented to  Procedure(s): CYSTOSCOPY WITH LITHOLAPAXY (N/A) as a surgical intervention.  The patient's history has been reviewed, patient examined, no change in status, stable for surgery.  I have reviewed the patient's chart and labs.  Questions were answered to the patient's satisfaction.     Irine Seal

## 2019-10-29 NOTE — Transfer of Care (Signed)
Immediate Anesthesia Transfer of Care Note  Patient: [name]  Procedure(s) Performed: Procedure(s): CYSTOSCOPY WITH LITHOLAPAXY (N/A)  Patient Location: PACU  Anesthesia Type:General  Level of Consciousness:  sedated, patient cooperative and responds to stimulation  Airway & Oxygen Therapy:Patient Spontanous Breathing and Patient connected to face mask oxgen  Post-op Assessment:  Report given to PACU RN and Post -op Vital signs reviewed and stable  Post vital signs:  Reviewed and stable  Last Vitals:  Vitals:   10/29/19 0759  BP: (!) 150/69  Pulse: 62  Resp: 15  Temp: 37 C  SpO2: [id number]    Complications: No apparent anesthesia complications

## 2019-10-29 NOTE — Anesthesia Preprocedure Evaluation (Addendum)
Anesthesia Evaluation  Patient identified by MRN, date of birth, ID band Patient awake    Reviewed: Allergy & Precautions, NPO status , Patient's Chart, lab work & pertinent test results, reviewed documented beta blocker date and time   Airway Mallampati: I  TM Distance: >3 FB Neck ROM: Full    Dental no notable dental hx. (+) Dental Advisory Given, Teeth Intact   Pulmonary Current Smoker and Patient abstained from smoking.,  Current smoker, 20 pack year history    Pulmonary exam normal breath sounds clear to auscultation       Cardiovascular hypertension, Pt. on medications and Pt. on home beta blockers + CAD, + Past MI, + Cardiac Stents, + CABG (2014) and + Peripheral Vascular Disease  Normal cardiovascular exam Rhythm:Regular Rate:Normal  AAA- last imaging 06/2019: maximal transverse diameter of the aneurysmal segment 4.3 x 3.9 cm.  Stents: LAD 1999;  RCA BMS 12/2007, LAD stent patent, 50-60% proximal Cx lesion treated medically  CABG x 2014: LIMA to LAD, SVG to ramus intermediate and OM branch with EVH   HLD  Last echo 2014: Left ventricle: The cavity size was normal. Wall thickness  was normal. Systolic function was normal. The estimated  ejection fraction was in the range of 55% to 60%. Wall  motion was normal; there were no regional wall motion  abnormalities.      Neuro/Psych PSYCHIATRIC DISORDERS Anxiety Depression negative neurological ROS     GI/Hepatic Neg liver ROS, PUD,   Endo/Other  diabetes, Well Controlled, Type 2, Oral Hypoglycemic AgentsLast a1c 6.3  Renal/GU Renal diseaseRecurrent nephrolithiasis   negative genitourinary   Musculoskeletal negative musculoskeletal ROS (+)   Abdominal Normal abdominal exam  (+)   Peds negative pediatric ROS (+)  Hematology  (+) Blood dyscrasia, anemia , Chronic thrombocytopenia- plt 96   Anesthesia Other Findings   Reproductive/Obstetrics negative OB  ROS                            Anesthesia Physical Anesthesia Plan  ASA: III  Anesthesia Plan: General   Post-op Pain Management:    Induction: Intravenous  PONV Risk Score and Plan: 1 and Ondansetron and Treatment may vary due to age or medical condition  Airway Management Planned: LMA  Additional Equipment: None  Intra-op Plan:   Post-operative Plan: Extubation in OR  Informed Consent: I have reviewed the patients History and Physical, chart, labs and discussed the procedure including the risks, benefits and alternatives for the proposed anesthesia with the patient or authorized representative who has indicated his/her understanding and acceptance.     Dental advisory given  Plan Discussed with: CRNA  Anesthesia Plan Comments:         Anesthesia Quick Evaluation

## 2019-10-29 NOTE — Anesthesia Postprocedure Evaluation (Signed)
Anesthesia Post Note  Patient: PIETER BACHAND  Procedure(s) Performed: CYSTOSCOPY WITH LITHOLAPAXY (N/A )     Patient location during evaluation: PACU Anesthesia Type: General Level of consciousness: awake and alert, oriented and patient cooperative Pain management: pain level controlled Vital Signs Assessment: post-procedure vital signs reviewed and stable Respiratory status: spontaneous breathing, nonlabored ventilation and respiratory function stable Cardiovascular status: blood pressure returned to baseline and stable Postop Assessment: no apparent nausea or vomiting Anesthetic complications: no    Last Vitals:  Vitals:   10/29/19 1115 10/29/19 1130  BP: (!) 148/83 127/76  Pulse: 66 61  Resp: 14 13  Temp:  (!) 36.4 C  SpO2: 97% 95%    Last Pain:  Vitals:   10/29/19 1130  TempSrc:   PainSc: 0-No pain                 Pervis Hocking

## 2019-10-29 NOTE — Op Note (Signed)
Procedure: Cystoscopy with cystolitholapaxy of 2.6 cm bladder stone.  Preop diagnosis: 2.6 cm bladder stone.  Postop diagnosis: Same.  Surgeon: Dr. Irine Seal.  Anesthesia: General.  Specimen: Stone fragments.  Drain: None.  EBL: None.  Complications: None.  Indications: [name] is a 69 year old male with a history of a bladder stone that had been asymptomatic until recently and on imaging had grown to 2.6 cm.  He is to undergo cystolitholapaxy today.  Procedure: He was given 2 g of Ancef.  He was taken operating room where general anesthetic was induced.  He was placed in lithotomy position and was fitted with PAS hose.  He was prepped with Betadine solution.  He was draped in usual sterile fashion.  Cystoscopy was performed with a 23 [place] scope and 30 degree lens.  Examination revealed normal urethra.  The external sphincter was intact.  The prostatic urethra was approximately 3 cm in length with bilobar hyperplasia with some degree of coaptation.  Examination of bladder revealed mild trabeculation.  There were no mucosal lesions.  Ureteral orifices were unremarkable.  There was a large jack stone calculus at the base of the bladder.  The cystoscope was fitted with the laser bridge and the 1000 m laser fiber was then passed.  The laser was initially set on 0.5 J and 20 Hz and there was some initial fragmentation, but the stone was hard so the energy was increased to 1 J and the rate eventually to 50 Hz with further fragmentation.  To aid evacuation of the stone fragments the 23 French sheath was replaced with a 26 French continuous-flow sheath after urethral dilation to 30 [place] with [employer] sounds to aid removal of the fragments.  Several fragments were removed but a few of the larger fragments required treatment at 2 J and 10 MHz to complete the process.  The residual fragments were then evacuated from the bladder and final inspection demonstrated only fine dust adherent to the  mucosa.  No bladder wall injury was noted.  The bladder was drained and the scope was removed.  There was some minor bleeding from the urethral wall in the mid penile urethra which was an area that was tight on dilation.  But no significant mucosal tears were identified.  It was not felt the catheter was indicated.  He was then taken down from lithotomy position, his anesthetic was reversed and he was moved to recovery in stable condition.  There were no complications.  1 fragment was sent for analysis and the remaining will be given to the patient.

## 2019-10-29 NOTE — Discharge Instructions (Signed)

## 2019-10-29 NOTE — Anesthesia Procedure Notes (Signed)
Procedure Name: LMA Insertion Date/Time: 10/29/2019 9:38 AM Performed by: Anne Fu, CRNA Pre-anesthesia Checklist: Patient identified, Emergency Drugs available, Suction available, Patient being monitored and Timeout performed Patient Re-evaluated:Patient Re-evaluated prior to induction Oxygen Delivery Method: Circle system utilized Preoxygenation: Pre-oxygenation with 100% oxygen Induction Type: IV induction Ventilation: Mask ventilation without difficulty LMA: LMA inserted LMA Size: 4.0 Number of attempts: 1 Placement Confirmation: positive ETCO2 and breath sounds checked- equal and bilateral Tube secured with: Tape

## 2019-10-30 ENCOUNTER — Encounter: Payer: Self-pay | Admitting: *Deleted

## 2019-11-05 LAB — CALCULI, WITH PHOTOGRAPH (CLINICAL LAB)
Calcium Oxalate Dihydrate: 20 %
Calcium Oxalate Monohydrate: 80 %
Weight Calculi: 57 mg

## 2019-11-20 ENCOUNTER — Ambulatory Visit: Payer: Medicare HMO | Admitting: Urology

## 2019-11-27 ENCOUNTER — Ambulatory Visit: Payer: Medicare HMO | Admitting: Urology

## 2019-11-27 ENCOUNTER — Other Ambulatory Visit: Payer: Self-pay

## 2019-11-27 ENCOUNTER — Encounter: Payer: Self-pay | Admitting: Urology

## 2019-11-27 VITALS — BP 139/65 | HR 63 | Temp 97.3°F | Ht 71.0 in | Wt 178.0 lb

## 2019-11-27 DIAGNOSIS — N138 Other obstructive and reflux uropathy: Secondary | ICD-10-CM

## 2019-11-27 DIAGNOSIS — N200 Calculus of kidney: Secondary | ICD-10-CM | POA: Diagnosis not present

## 2019-11-27 DIAGNOSIS — N401 Enlarged prostate with lower urinary tract symptoms: Secondary | ICD-10-CM | POA: Diagnosis not present

## 2019-11-27 DIAGNOSIS — R350 Frequency of micturition: Secondary | ICD-10-CM | POA: Diagnosis not present

## 2019-11-27 DIAGNOSIS — N210 Calculus in bladder: Secondary | ICD-10-CM | POA: Diagnosis not present

## 2019-11-27 LAB — POCT URINALYSIS DIPSTICK
Blood, UA: NEGATIVE
Glucose, UA: NEGATIVE
Ketones, UA: NEGATIVE
Leukocytes, UA: NEGATIVE
Nitrite, UA: NEGATIVE
Protein, UA: POSITIVE — AB
Spec Grav, UA: >=1.03 — AB (ref 1.010–1.025)
Urobilinogen, UA: NEGATIVE E.U./dL — AB
pH, UA: 5 (ref 5.0–8.0)

## 2019-11-27 NOTE — Progress Notes (Signed)
Subjective:  1. Nephrolithiasis   2. Bladder stone   3. BPH with urinary obstruction   4. Urinary frequency      [name] returns in f/u.  He had a cystolithalopaxy on 10/29/19 for a 2.6cm stone.  He is doing well without hematuria and minimal LUTS with an IPSS of 1.    He has some BPH with BOO and has had a good response to tamsulosin in the past but his LUTS haven't been too severe.        ROS:  ROS:  A complete review of systems was performed.  All systems are negative except for pertinent findings as noted.   ROS  Allergies  Allergen Reactions  . Meperidine Hcl Other (See Comments)    Was mixed with phenergan and turned blood vessels red.   . Promethazine Hcl Other (See Comments)    Was mixed with the Demerol and caused veins to turn red.     Outpatient Encounter Medications as of 11/27/2019  Medication Sig Note  . acetaminophen (TYLENOL) 500 MG tablet Take 1,000 mg by mouth every 6 (six) hours as needed for moderate pain (knees).   Marland Kitchen aspirin EC 81 MG tablet Take 81 mg by mouth daily.   Marland Kitchen atorvastatin (LIPITOR) 40 MG tablet Take 1 tablet (40 mg total) by mouth daily.   Marland Kitchen HYDROcodone-acetaminophen (NORCO/VICODIN) 5-325 MG tablet Take 1 tablet by mouth every 6 (six) hours as needed for moderate pain.   Marland Kitchen loratadine (CLARITIN) 10 MG tablet Take 10 mg by mouth daily.   . metoprolol tartrate (LOPRESSOR) 25 MG tablet Take 1 tablet (25 mg total) by mouth 2 (two) times daily.   . phenazopyridine (PYRIDIUM) 200 MG tablet Take 1 tablet (200 mg total) by mouth 3 (three) times daily as needed for pain.   . pioglitazone-metformin (ACTOPLUS MET) 15-850 MG tablet Take 1 tablet by mouth 2 (two) times daily with a meal.    . ramipril (ALTACE) 10 MG capsule TAKE 2 CAPSULES BY MOUTH EVERY DAY WITH BREAKFAST (Patient taking differently: Take 20 mg by mouth daily with breakfast. )   . tadalafil (CIALIS) 20 MG tablet Take 1 tablet as directed 30 min prior to intercourse. 10/29/2019: Approximately 2  weeks ago  . tamsulosin (FLOMAX) 0.4 MG CAPS capsule Take 1 capsule (0.4 mg total) by mouth daily.    No facility-administered encounter medications on file as of 11/27/2019.    Past Medical History:  Diagnosis Date  . AAA (abdominal aortic aneurysm) (HCC)    3.3 cm in 2012  . ABDOMINAL AORTIC ANEURYSM 11/07/2009   Diameter of 3.3 cm by CT in 12/2010   . Anxiety   . ASCVD (arteriosclerotic cardiovascular disease)    LAD stent in 1999; RCA bare-metal stent in 12/2007, LAD stent patent, 50-60% proximal Cx lesion treated medically; 09/2012: CABG x3, LIMA to LAD, Sequential SVG to ramus intermediate and OM branch with EVH via right thigh  . Coronary artery disease   . Depression   . Diabetes mellitus   . DIABETES MELLITUS, TYPE II 08/09/2010   no insulin    . ED (erectile dysfunction)   . Gastric ulcer   . Hepatic steatosis   . Hyperlipidemia   . HYPERLIPIDEMIA 08/09/2010   Lipid profile in 07/2010:144, 124, 34, 86; 01/2012:93, 82, 34, 43   . Insomnia   . MI (myocardial infarction) ([place])   . Nephrolithiasis   . NEPHROLITHIASIS, RECURRENT 08/09/2010   Obstruction of the ureter on CT scan of the  abdomen resulting in right hydronephrosis in 12/2010; bladder outlet obstruction prior to that.  CT also revealed atherosclerotic changes in the abdominal arterial vessels, hepatic granuloma and calcification of the prostate.   . S/P CABG x 3 10/07/2012   LIMA to LAD, Sequential SVG to ramus intermediate and OM branch with EVH via right thigh  . THROMBOCYTOPENIA 08/09/2010   Platelets 88-114,000 over the past 4 years; prior evaluation by Dr. Tressie Stalker without a specific etiology identified.  01/2012: Normal CBC ex platelets-120   . Thrombocytopenia ([place])    Chronic; evaluated by Dr. Tressie Stalker in 2007  . Tobacco abuse     Past Surgical History:  Procedure Laterality Date  . [place], 2009  . CORONARY ARTERY BYPASS GRAFT N/A 10/07/2012   Procedure: CORONARY ARTERY  BYPASS GRAFTING (CABG);  Surgeon: Rexene Alberts, MD;  Location: [place];  Service: Open Heart Surgery;  Laterality: N/A;  . CYSTOSCOPY WITH LITHOLAPAXY N/A 10/29/2019   Procedure: CYSTOSCOPY WITH LITHOLAPAXY;  Surgeon: Irine Seal, MD;  Location: [facility];  Service: Urology;  Laterality: N/A;  . CYSTOSCOPY WITH RETROGRADE PYELOGRAM, URETEROSCOPY AND STENT PLACEMENT Left 08/30/2015   Procedure: CYSTOSCOPY LEFT RETROGRADE PYELOGRAM  LEFT   URETEROSCOPY,  LITHOLAPAXY;  Surgeon: Irine Seal, MD;  Location: [facility];  Service: Urology;  Laterality: Left;  . ESOPHAGOGASTRODUODENOSCOPY  04/04/2012   Procedure: ESOPHAGOGASTRODUODENOSCOPY (EGD);  Surgeon: Rogene Houston, MD;  [facility];  Service: Endoscopy;  Laterality: N/A;  830  . ESOPHAGOGASTRODUODENOSCOPY  07/25/2012   Procedure: ESOPHAGOGASTRODUODENOSCOPY (EGD);  Surgeon: Rogene Houston, MD;  [facility];  Service: Endoscopy;  Laterality: N/A;  830  . HOLMIUM LASER APPLICATION Left 02/25/785   Procedure: HOLMIUM LASER APPLICATION;  Surgeon: Irine Seal, MD;  Location[facility];  Service: Urology;  Laterality: Left;  . INGUINAL HERNIA REPAIR    . INTRAOPERATIVE TRANSESOPHAGEAL ECHOCARDIOGRAM N/A 10/07/2012   Procedure: INTRAOPERATIVE TRANSESOPHAGEAL ECHOCARDIOGRAM;  Surgeon: Rexene Alberts, MD;  Location: [place];  Service: Open Heart Surgery;  Laterality: N/A;  . LEFT HEART CATHETERIZATION WITH CORONARY ANGIOGRAM N/A 10/03/2012   Procedure: LEFT HEART CATHETERIZATION WITH CORONARY ANGIOGRAM;  Surgeon: Minus Breeding, MD;  Location: [facility];  Service: Cardiovascular;  Laterality: N/A;  . LITHOTRIPSY      Social History   Socioeconomic History  . Marital status: Married    Spouse name: Not on file  . Number of children: Not on file  . Years of education: Not on file  . Highest education level: Not on file  Occupational History  . Not on file  Tobacco Use  . Smoking status: Current Some Day Smoker    Packs/day: 0.50    Years:  40.00    Pack years: 20.00    Types: Cigarettes    Last attempt to quit: 07/09/2013    Years since quitting: 6.3  . Smokeless tobacco: Never Used  . Tobacco comment: 6-7 cigs. daily   Substance and Sexual Activity  . Alcohol use: No    Alcohol/week: 0.0 standard drinks  . Drug use: No  . Sexual activity: Not on file  Other Topics Concern  . Not on file  Social History Narrative   No regular exercise   Social Determinants of Health   Financial Resource Strain:   . Difficulty of Paying Living Expenses:   Food Insecurity:   . Worried About Charity fundraiser in the Last Year:   . Ran  Out of Food in the Last Year:   Transportation Needs:   . Lack of Transportation (Medical):   Marland Kitchen Lack of Transportation (Non-Medical):   Physical Activity:   . Days of Exercise per Week:   . Minutes of Exercise per Session:   Stress:   . Feeling of Stress :   Social Connections:   . Frequency of Communication with Friends and Family:   . Frequency of Social Gatherings with Friends and Family:   . Attends Religious Services:   . Active Member of Clubs or Organizations:   . Attends Archivist Meetings:   Marland Kitchen Marital Status:   Intimate Partner Violence:   . Fear of Current or Ex-Partner:   . Emotionally Abused:   Marland Kitchen Physically Abused:   . Sexually Abused:     Family History  Problem Relation Age of Onset  . Ovarian cancer Sister        Objective: Vitals:   11/27/19 1438  BP: 139/65  Pulse: 63  Temp: (!) 97.3 F (36.3 C)     Physical Exam  Lab Results:  Results for orders placed or performed in visit on 11/27/19 (from the past 24 hour(s))  POCT urinalysis dipstick     Status: Abnormal   Collection Time: 11/27/19  2:50 PM  Result Value Ref Range   Color, UA yellow    Clarity, UA clear    Glucose, UA Negative Negative   Bilirubin, UA small    Ketones, UA neg    Spec Grav, UA >=1.030 (A) 1.010 - 1.025   Blood, UA neg    pH, UA 5.0 5.0 - 8.0   Protein, UA Positive  (A) Negative   Urobilinogen, UA negative (A) 0.2 or 1.0 E.U./dL   Nitrite, UA neg    Leukocytes, UA Negative Negative   Appearance clear    Odor      BMET No results for input(s): NA, K, CL, CO2, GLUCOSE, BUN, CREATININE, CALCIUM in the last 72 hours. PSA No results found for: PSA No results found for: TESTOSTERONE    Studies/Results:  Stone was mixed calcium oxalate.    Assessment & Plan:  He is doing well s/p cystolithalopaxy for a 2.6cm bladder stone.  He has no hematuria or dysuria and the UA is unremarkable.  He has a history of nephrolithiasis and I will have him return in a year with a KUB.    BPH with BOO with mild LUTS and an IPSS of 1..   He will stay on the tamsulosin at this time.  No orders of the defined types were placed in this encounter.    Orders Placed This Encounter  Procedures  . DG Abd 1 View    Standing Status:   Future    Standing Expiration Date:   11/26/2020    Order Specific Question:   Reason for Exam (SYMPTOM  OR DIAGNOSIS REQUIRED)    Answer:   renal stones    Order Specific Question:   Preferred imaging location?    Answer:   [facility]    Order Specific Question:   Radiology Contrast Protocol - do NOT remove file path    Answer:   [email or web address]  . POCT urinalysis dipstick      Return in about 1 year (around 11/26/2020) for With KUB.   CC: Redmond School, MD      Irine Seal 11/27/2019 Patient ID: Tawanna Sat, male   DOB: 1950-12-30, 69 y.o.   MRN: [id number]

## 2019-11-27 NOTE — Progress Notes (Signed)

## 2019-12-29 ENCOUNTER — Other Ambulatory Visit: Payer: Self-pay | Admitting: Cardiology

## 2020-01-27 DIAGNOSIS — R69 Illness, unspecified: Secondary | ICD-10-CM | POA: Diagnosis not present

## 2020-02-29 ENCOUNTER — Encounter: Payer: Self-pay | Admitting: Emergency Medicine

## 2020-02-29 ENCOUNTER — Other Ambulatory Visit: Payer: Self-pay | Admitting: Cardiology

## 2020-02-29 ENCOUNTER — Ambulatory Visit
Admission: EM | Admit: 2020-02-29 | Discharge: 2020-02-29 | Disposition: A | Discharge status: Home / Self Care | Payer: Medicare HMO | Attending: Emergency Medicine | Admitting: Emergency Medicine

## 2020-02-29 ENCOUNTER — Other Ambulatory Visit: Payer: Self-pay

## 2020-02-29 DIAGNOSIS — R21 Rash and other nonspecific skin eruption: Secondary | ICD-10-CM | POA: Diagnosis not present

## 2020-02-29 MED ORDER — PREDNISONE 20 MG PO TABS: 20.0000 mg | ORAL_TABLET | Freq: Two times a day (BID) | ORAL | 0 refills | Status: AC

## 2020-02-29 NOTE — ED Provider Notes (Signed)
[place]   [id number] 02/29/20 Arrival Time: 6834  CC: Rash  SUBJECTIVE:  [name] is a 69 y.o. male who presents with a rash to bilateral LEs x 3-4 days.  Symptoms began after weed eating.  States he may have come in to contact with poison oak.  Denies changes in soaps, detergents, close contacts with similar rash, known trigger or environmental trigger, allergy. Denies medications change or starting a new medication recently.  Localizes the rash to bilateral LEs.  Describes it as itching and spreading.  Denies alleviating or aggravating factors.  Denies similar symptoms in the past .   Denies fever, chills, nausea, vomiting, swelling, discharge, oral lesions, genital lesions, SOB, chest pain, abdominal pain, changes in bowel or bladder function.    ROS: As per HPI.  All other pertinent ROS negative.     Past Medical History:  Diagnosis Date  . AAA (abdominal aortic aneurysm) (HCC)    3.3 cm in 2012  . ABDOMINAL AORTIC ANEURYSM 11/07/2009   Diameter of 3.3 cm by CT in 12/2010   . Anxiety   . ASCVD (arteriosclerotic cardiovascular disease)    LAD stent in 1999; RCA bare-metal stent in 12/2007, LAD stent patent, 50-60% proximal Cx lesion treated medically; 09/2012: CABG x3, LIMA to LAD, Sequential SVG to ramus intermediate and OM branch with EVH via right thigh  . Coronary artery disease   . Depression   . Diabetes mellitus   . DIABETES MELLITUS, TYPE II 08/09/2010   no insulin    . ED (erectile dysfunction)   . Gastric ulcer   . Hepatic steatosis   . Hyperlipidemia   . HYPERLIPIDEMIA 08/09/2010   Lipid profile in 07/2010:144, 124, 34, 86; 01/2012:93, 82, 34, 43   . Insomnia   . MI (myocardial infarction) ([place])   . Nephrolithiasis   . NEPHROLITHIASIS, RECURRENT 08/09/2010   Obstruction of the ureter on CT scan of the abdomen resulting in right hydronephrosis in 12/2010; bladder outlet obstruction prior to that.  CT also revealed atherosclerotic changes in the abdominal  arterial vessels, hepatic granuloma and calcification of the prostate.   . S/P CABG x 3 10/07/2012   LIMA to LAD, Sequential SVG to ramus intermediate and OM branch with EVH via right thigh  . THROMBOCYTOPENIA 08/09/2010   Platelets 88-114,000 over the past 4 years; prior evaluation by Dr. Tressie Stalker without a specific etiology identified.  01/2012: Normal CBC ex platelets-120   . Thrombocytopenia ([place])    Chronic; evaluated by Dr. Tressie Stalker in 2007  . Tobacco abuse    Past Surgical History:  Procedure Laterality Date  . [place], 2009  . CORONARY ARTERY BYPASS GRAFT N/A 10/07/2012   Procedure: CORONARY ARTERY BYPASS GRAFTING (CABG);  Surgeon: Rexene Alberts, MD;  Location: [place];  Service: Open Heart Surgery;  Laterality: N/A;  . CYSTOSCOPY WITH LITHOLAPAXY N/A 10/29/2019   Procedure: CYSTOSCOPY WITH LITHOLAPAXY;  Surgeon: Irine Seal, MD;  [facility];  Service: Urology;  Laterality: N/A;  . CYSTOSCOPY WITH RETROGRADE PYELOGRAM, URETEROSCOPY AND STENT PLACEMENT Left 08/30/2015   Procedure: CYSTOSCOPY LEFT RETROGRADE PYELOGRAM  LEFT   URETEROSCOPY,  LITHOLAPAXY;  Surgeon: Irine Seal, MD;  [facility];  Service: Urology;  Laterality: Left;  . ESOPHAGOGASTRODUODENOSCOPY  04/04/2012   Procedure: ESOPHAGOGASTRODUODENOSCOPY (EGD);  Surgeon: Rogene Houston, MD;  [facility];  Service: Endoscopy;  Laterality: N/A;  830  . ESOPHAGOGASTRODUODENOSCOPY  07/25/2012  Procedure: ESOPHAGOGASTRODUODENOSCOPY (EGD);  Surgeon: Rogene Houston, MD;  [facility];  Service: Endoscopy;  Laterality: N/A;  830  . HOLMIUM LASER APPLICATION Left 3/47/4259   Procedure: HOLMIUM LASER APPLICATION;  Surgeon: Irine Seal, MD;  [facility];  Service: Urology;  Laterality: Left;  . INGUINAL HERNIA REPAIR    . INTRAOPERATIVE TRANSESOPHAGEAL ECHOCARDIOGRAM N/A 10/07/2012   Procedure: INTRAOPERATIVE TRANSESOPHAGEAL ECHOCARDIOGRAM;  Surgeon: Rexene Alberts,  MD;  Location: [place];  Service: Open Heart Surgery;  Laterality: N/A;  . LEFT HEART CATHETERIZATION WITH CORONARY ANGIOGRAM N/A 10/03/2012   Procedure: LEFT HEART CATHETERIZATION WITH CORONARY ANGIOGRAM;  Surgeon: Minus Breeding, MD;  Location: [facility] CATH [facility];  Service: Cardiovascular;  Laterality: N/A;  . LITHOTRIPSY     Allergies  Allergen Reactions  . Meperidine Hcl Other (See Comments)    Was mixed with phenergan and turned blood vessels red.   . Promethazine Hcl Other (See Comments)    Was mixed with the Demerol and caused veins to turn red.    No current facility-administered medications on file prior to encounter.   Current Outpatient Medications on File Prior to Encounter  Medication Sig Dispense Refill  . acetaminophen (TYLENOL) 500 MG tablet Take 1,000 mg by mouth every 6 (six) hours as needed for moderate pain (knees).    Marland Kitchen aspirin EC 81 MG tablet Take 81 mg by mouth daily.    Marland Kitchen atorvastatin (LIPITOR) 40 MG tablet Take 1 tablet (40 mg total) by mouth daily. 90 tablet 3  . loratadine (CLARITIN) 10 MG tablet Take 10 mg by mouth daily.    . metoprolol tartrate (LOPRESSOR) 25 MG tablet TAKE 1 TABLET BY MOUTH TWICE A DAY 180 tablet 3  . phenazopyridine (PYRIDIUM) 200 MG tablet Take 1 tablet (200 mg total) by mouth 3 (three) times daily as needed for pain. 10 tablet 0  . pioglitazone-metformin (ACTOPLUS MET) 15-850 MG tablet Take 1 tablet by mouth 2 (two) times daily with a meal.   5  . ramipril (ALTACE) 10 MG capsule TAKE 2 CAPSULES BY MOUTH EVERY DAY WITH BREAKFAST (Patient taking differently: Take 20 mg by mouth daily with breakfast. ) 180 capsule 3  . tadalafil (CIALIS) 20 MG tablet Take 1 tablet as directed 30 min prior to intercourse. 30 tablet 6  . tamsulosin (FLOMAX) 0.4 MG CAPS capsule Take 1 capsule (0.4 mg total) by mouth daily. 30 capsule 11   Social History   Socioeconomic History  . Marital status: Married    Spouse name: Not on file  . Number of children: Not on file    . Years of education: Not on file  . Highest education level: Not on file  Occupational History  . Not on file  Tobacco Use  . Smoking status: Current Some Day Smoker    Packs/day: 0.50    Years: 40.00    Pack years: 20.00    Types: Cigarettes    Last attempt to quit: 07/09/2013    Years since quitting: 6.6  . Smokeless tobacco: Never Used  . Tobacco comment: 6-7 cigs. daily   Vaping Use  . Vaping Use: Never used  Substance and Sexual Activity  . Alcohol use: No    Alcohol/week: 0.0 standard drinks  . Drug use: No  . Sexual activity: Not on file  Other Topics Concern  . Not on file  Social History Narrative   No regular exercise   Social Determinants of Health   Financial Resource Strain:   .  Difficulty of Paying Living Expenses: Not on file  Food Insecurity:   . Worried About Charity fundraiser in the Last Year: Not on file  . Ran Out of Food in the Last Year: Not on file  Transportation Needs:   . Lack of Transportation (Medical): Not on file  . Lack of Transportation (Non-Medical): Not on file  Physical Activity:   . Days of Exercise per Week: Not on file  . Minutes of Exercise per Session: Not on file  Stress:   . Feeling of Stress : Not on file  Social Connections:   . Frequency of Communication with Friends and Family: Not on file  . Frequency of Social Gatherings with Friends and Family: Not on file  . Attends Religious Services: Not on file  . Active Member of Clubs or Organizations: Not on file  . Attends Archivist Meetings: Not on file  . Marital Status: Not on file  Intimate Partner Violence:   . Fear of Current or Ex-Partner: Not on file  . Emotionally Abused: Not on file  . Physically Abused: Not on file  . Sexually Abused: Not on file   Family History  Problem Relation Age of Onset  . Ovarian cancer Sister     OBJECTIVE: Vitals:   02/29/20 1616  BP: (!) 136/59  Pulse: 75  Resp: 16  Temp: 97.9 F (36.6 C)  TempSrc: Oral   SpO2: 98%    General appearance: alert; no distress Head: NCAT Lungs: normal respiratory effort Extremities: no edema Skin: warm and dry; erythematous small macules to bilateral LE, NTTP, no obvious drainage or bleeding Psychological: alert and cooperative; normal mood and affect  ASSESSMENT & PLAN:  1. Rash and nonspecific skin eruption     Meds ordered this encounter  Medications  . predniSONE (DELTASONE) 20 MG tablet    Sig: Take 1 tablet (20 mg total) by mouth 2 (two) times daily with a meal for 5 days.    Dispense:  10 tablet    Refill:  0    Order Specific Question:   Supervising Provider    Answer:   Raylene Everts [[id number]]   Wash with warm water and mild soap Steroid prescribed.  Take as directed and to completion Follow up with PCP if symptoms persists Return or go to the ER if you have any new or worsening symptoms such as fever, chills, nausea, vomiting, redness, swelling, discharge, if symptoms do not improve with medications, etc...  Reviewed expectations re: course of current medical issues. Questions answered. Outlined signs and symptoms indicating need for more acute intervention. Patient verbalized understanding. After Visit Summary given.   Lestine Box, PA-C 02/29/20 1703

## 2020-02-29 NOTE — Discharge Instructions (Signed)
Wash with warm water and mild soap Steroid prescribed.  Take as directed and to completion Follow up with PCP if symptoms persists Return or go to the ER if you have any new or worsening symptoms such as fever, chills, nausea, vomiting, redness, swelling, discharge, if symptoms do not improve with medications, etc..Marland Kitchen

## 2020-02-29 NOTE — ED Triage Notes (Signed)
Rash to bilateral lower legs that's moving up his back

## 2020-03-21 DIAGNOSIS — R69 Illness, unspecified: Secondary | ICD-10-CM | POA: Diagnosis not present

## 2020-03-31 DIAGNOSIS — R69 Illness, unspecified: Secondary | ICD-10-CM | POA: Diagnosis not present

## 2020-04-04 DIAGNOSIS — R69 Illness, unspecified: Secondary | ICD-10-CM | POA: Diagnosis not present

## 2020-06-07 DIAGNOSIS — R69 Illness, unspecified: Secondary | ICD-10-CM | POA: Diagnosis not present

## 2020-06-30 DIAGNOSIS — R69 Illness, unspecified: Secondary | ICD-10-CM | POA: Diagnosis not present

## 2020-09-27 ENCOUNTER — Other Ambulatory Visit: Payer: Self-pay | Admitting: Urology

## 2020-10-20 DIAGNOSIS — Z6824 Body mass index (BMI) 24.0-24.9, adult: Secondary | ICD-10-CM | POA: Diagnosis not present

## 2020-10-20 DIAGNOSIS — Z23 Encounter for immunization: Secondary | ICD-10-CM | POA: Diagnosis not present

## 2020-10-20 DIAGNOSIS — Z125 Encounter for screening for malignant neoplasm of prostate: Secondary | ICD-10-CM | POA: Diagnosis not present

## 2020-10-20 DIAGNOSIS — I25799 Atherosclerosis of other coronary artery bypass graft(s) with unspecified angina pectoris: Secondary | ICD-10-CM | POA: Diagnosis not present

## 2020-10-20 DIAGNOSIS — Z0001 Encounter for general adult medical examination with abnormal findings: Secondary | ICD-10-CM | POA: Diagnosis not present

## 2020-10-20 DIAGNOSIS — Z1389 Encounter for screening for other disorder: Secondary | ICD-10-CM | POA: Diagnosis not present

## 2020-10-20 DIAGNOSIS — I10 Essential (primary) hypertension: Secondary | ICD-10-CM | POA: Diagnosis not present

## 2020-10-20 DIAGNOSIS — N202 Calculus of kidney with calculus of ureter: Secondary | ICD-10-CM | POA: Diagnosis not present

## 2020-10-20 DIAGNOSIS — Z1331 Encounter for screening for depression: Secondary | ICD-10-CM | POA: Diagnosis not present

## 2020-10-20 DIAGNOSIS — E7849 Other hyperlipidemia: Secondary | ICD-10-CM | POA: Diagnosis not present

## 2020-10-20 DIAGNOSIS — E1151 Type 2 diabetes mellitus with diabetic peripheral angiopathy without gangrene: Secondary | ICD-10-CM | POA: Diagnosis not present

## 2020-10-20 DIAGNOSIS — I2510 Atherosclerotic heart disease of native coronary artery without angina pectoris: Secondary | ICD-10-CM | POA: Diagnosis not present

## 2020-11-17 ENCOUNTER — Encounter (INDEPENDENT_AMBULATORY_CARE_PROVIDER_SITE_OTHER): Payer: Self-pay | Admitting: *Deleted

## 2020-12-22 ENCOUNTER — Other Ambulatory Visit (INDEPENDENT_AMBULATORY_CARE_PROVIDER_SITE_OTHER): Payer: Self-pay

## 2020-12-22 ENCOUNTER — Telehealth (INDEPENDENT_AMBULATORY_CARE_PROVIDER_SITE_OTHER): Payer: Self-pay

## 2020-12-22 DIAGNOSIS — Z1211 Encounter for screening for malignant neoplasm of colon: Secondary | ICD-10-CM

## 2020-12-23 ENCOUNTER — Telehealth (INDEPENDENT_AMBULATORY_CARE_PROVIDER_SITE_OTHER): Payer: Self-pay

## 2020-12-23 ENCOUNTER — Encounter (INDEPENDENT_AMBULATORY_CARE_PROVIDER_SITE_OTHER): Payer: Self-pay

## 2020-12-23 MED ORDER — PEG 3350-KCL-NA BICARB-NACL 420 G PO SOLR: 4000.0000 mL | ORAL | 0 refills | Status: DC

## 2020-12-23 NOTE — Telephone Encounter (Signed)
[name], CMA  

## 2020-12-23 NOTE — Telephone Encounter (Signed)
LeighAnn Amberlea Spagnuolo, CMA  

## 2020-12-23 NOTE — Telephone Encounter (Signed)
Referring MD/PCP: Hilma Favors  Procedure: Tcs   Reason/Indication:  Screening  Has patient had this procedure before?  yes  If so, when, by whom and where?2012    Is there a family history of colon cancer?  no  Who?  What age when diagnosed?    Is patient diabetic? If yes, Type 1 or Type 2   yes type 2       Does patient have prosthetic heart valve or mechanical valve?  no  Do you have a pacemaker/defibrillator?  no  Has patient ever had endocarditis/atrial fibrillation? no  Does patient use oxygen? no  Has patient had joint replacement within last 12 months?  no  Is patient constipated or do they take laxatives? no  Does patient have a history of alcohol/drug use?  no  Have you had a stroke/heart attack last 6 mths? no  Do you take medicine for weight loss?  no  For male patients,: do you still have your menstrual cycle? N/A  Is patient on blood thinner such as Coumadin, Plavix and/or Aspirin? yes  Medications: asa 81 mg daily, atorvastatin 40 mg daily, metoprolol 25mg  daily, tamsulosin 0.4 mg daily, ramipril 10 mg bid, metformin 1000 mg bid, tadalafil 5 mg prn, acetaminophen 500 mg  prn  Allergies: Meperidine, Promethazine  Medication Adjustment per Dr Laural Golden: hold metformin the evening prior and morning of hold asa 2 days prior  Procedure date & time: 01/19/21 12:30

## 2021-01-19 ENCOUNTER — Ambulatory Visit (HOSPITAL_COMMUNITY)
Admission: RE | Admit: 2021-01-19 | Discharge: 2021-01-19 | Disposition: A | Discharge status: Home / Self Care | Payer: Medicare HMO | Attending: Internal Medicine | Admitting: Internal Medicine

## 2021-01-19 ENCOUNTER — Encounter (HOSPITAL_COMMUNITY)
Admission: RE | Disposition: A | Discharge status: Home / Self Care | Payer: Self-pay | Source: Home / Self Care | Attending: Internal Medicine

## 2021-01-19 ENCOUNTER — Other Ambulatory Visit: Payer: Self-pay

## 2021-01-19 ENCOUNTER — Encounter (HOSPITAL_COMMUNITY): Payer: Self-pay | Admitting: Internal Medicine

## 2021-01-19 DIAGNOSIS — Z955 Presence of coronary angioplasty implant and graft: Secondary | ICD-10-CM | POA: Insufficient documentation

## 2021-01-19 DIAGNOSIS — D125 Benign neoplasm of sigmoid colon: Secondary | ICD-10-CM | POA: Insufficient documentation

## 2021-01-19 DIAGNOSIS — F17210 Nicotine dependence, cigarettes, uncomplicated: Secondary | ICD-10-CM | POA: Diagnosis not present

## 2021-01-19 DIAGNOSIS — Z888 Allergy status to other drugs, medicaments and biological substances status: Secondary | ICD-10-CM | POA: Diagnosis not present

## 2021-01-19 DIAGNOSIS — Z1211 Encounter for screening for malignant neoplasm of colon: Secondary | ICD-10-CM | POA: Insufficient documentation

## 2021-01-19 DIAGNOSIS — Z7984 Long term (current) use of oral hypoglycemic drugs: Secondary | ICD-10-CM | POA: Insufficient documentation

## 2021-01-19 DIAGNOSIS — K644 Residual hemorrhoidal skin tags: Secondary | ICD-10-CM | POA: Diagnosis not present

## 2021-01-19 DIAGNOSIS — D123 Benign neoplasm of transverse colon: Secondary | ICD-10-CM | POA: Diagnosis not present

## 2021-01-19 DIAGNOSIS — D127 Benign neoplasm of rectosigmoid junction: Secondary | ICD-10-CM | POA: Diagnosis not present

## 2021-01-19 DIAGNOSIS — Z885 Allergy status to narcotic agent status: Secondary | ICD-10-CM | POA: Diagnosis not present

## 2021-01-19 DIAGNOSIS — R69 Illness, unspecified: Secondary | ICD-10-CM | POA: Diagnosis not present

## 2021-01-19 DIAGNOSIS — Z8041 Family history of malignant neoplasm of ovary: Secondary | ICD-10-CM | POA: Insufficient documentation

## 2021-01-19 DIAGNOSIS — E119 Type 2 diabetes mellitus without complications: Secondary | ICD-10-CM | POA: Diagnosis not present

## 2021-01-19 DIAGNOSIS — Z79899 Other long term (current) drug therapy: Secondary | ICD-10-CM | POA: Insufficient documentation

## 2021-01-19 DIAGNOSIS — Z7982 Long term (current) use of aspirin: Secondary | ICD-10-CM | POA: Diagnosis not present

## 2021-01-19 DIAGNOSIS — Z951 Presence of aortocoronary bypass graft: Secondary | ICD-10-CM | POA: Diagnosis not present

## 2021-01-19 HISTORY — PX: POLYPECTOMY: SHX5525

## 2021-01-19 HISTORY — PX: COLONOSCOPY: SHX5424

## 2021-01-19 HISTORY — DX: Essential (primary) hypertension: I10

## 2021-01-19 LAB — HM COLONOSCOPY

## 2021-01-19 LAB — GLUCOSE, CAPILLARY: Glucose-Capillary: 134 mg/dL — ABNORMAL HIGH (ref 70–99)

## 2021-01-19 SURGERY — COLONOSCOPY
Anesthesia: Moderate Sedation

## 2021-01-19 MED ORDER — MEPERIDINE HCL 50 MG/ML IJ SOLN
INTRAMUSCULAR | Status: AC
  Filled 2021-01-19: qty 1

## 2021-01-19 MED ORDER — STERILE WATER FOR IRRIGATION IR SOLN
Status: DC | PRN
  Administered 2021-01-19: 100 mL

## 2021-01-19 MED ORDER — SODIUM CHLORIDE 0.9 % IV SOLN: INTRAVENOUS | Status: DC

## 2021-01-19 MED ORDER — MIDAZOLAM HCL 5 MG/5ML IJ SOLN
INTRAMUSCULAR | Status: AC
  Filled 2021-01-19: qty 10

## 2021-01-19 MED ORDER — FENTANYL CITRATE (PF) 100 MCG/2ML IJ SOLN
INTRAMUSCULAR | Status: AC
  Filled 2021-01-19: qty 2

## 2021-01-19 MED ORDER — FENTANYL CITRATE (PF) 100 MCG/2ML IJ SOLN
INTRAMUSCULAR | Status: DC | PRN
  Administered 2021-01-19 (×3): 25 ug via INTRAVENOUS

## 2021-01-19 MED ORDER — MIDAZOLAM HCL 5 MG/5ML IJ SOLN
INTRAMUSCULAR | Status: DC | PRN
  Administered 2021-01-19 (×3): 2 mg via INTRAVENOUS

## 2021-01-19 NOTE — Op Note (Signed)
[facility] Patient Name: [name] Procedure Date: 01/19/2021 9:50 AM MRN: [id number] Date of Birth: 1950/07/14 Attending MD: Hildred Laser , MD CSN: [id number] Age: 70 Admit Type: Outpatient Procedure:                Colonoscopy Indications:              Screening for colorectal malignant neoplasm Providers:                Hildred Laser, MD, Crystal Page, Randa Spike,                            Technician Referring MD:             Redmond School, MD Medicines:                Fentanyl 75 micrograms IV, Midazolam 6 mg IV Complications:            No immediate complications. Estimated Blood Loss:     Estimated blood loss was minimal. Procedure:                Pre-Anesthesia Assessment:                           - Prior to the procedure, a History and Physical                            was performed, and patient medications and                            allergies were reviewed. The patient's tolerance of                            previous anesthesia was also reviewed. The risks                            and benefits of the procedure and the sedation                            options and risks were discussed with the patient.                            All questions were answered, and informed consent                            was obtained. Prior Anticoagulants: The patient has                            taken no previous anticoagulant or antiplatelet                            agents except for aspirin. ASA Grade Assessment: II                            - A patient with mild systemic disease. After  reviewing the risks and benefits, the patient was                            deemed in satisfactory condition to undergo the                            procedure.                           After obtaining informed consent, the colonoscope                            was passed under direct vision. Throughout the                             procedure, the patient's blood pressure, pulse, and                            oxygen saturations were monitored continuously. The                            [id number]-[id number] scope was introduced through                            the anus and advanced to the the cecum, identified                            by appendiceal orifice and ileocecal valve. The                            colonoscopy was performed without difficulty. The                            patient tolerated the procedure well. The quality                            of the bowel preparation was adequate to identify                            polyps. The ileocecal valve, appendiceal orifice,                            and rectum were photographed. The ileocecal valve,                            appendiceal orifice, and rectum were photographed. Scope In: 10:29:13 AM Scope Out: 10:52:12 AM Scope Withdrawal Time: 0 hours 19 minutes 36 seconds  Total Procedure Duration: 0 hours 22 minutes 59 seconds  Findings:      The perianal and digital rectal examinations were normal.      Two polyps were found in the transverse colon and hepatic flexure. The       polyps were 4 to 6 mm in size. These polyps were removed with a cold       snare. Resection and retrieval were  complete. The pathology specimen was       placed into Bottle Number 1.      A 5 mm polyp was found in the recto-sigmoid colon. The polyp was removed       with a cold snare. Resection and retrieval were complete. The pathology       specimen was placed into Bottle Number 2.      External hemorrhoids were found during retroflexion. The hemorrhoids       were small. Impression:               - Two 4 to 6 mm polyps in the transverse colon and                            at the hepatic flexure, removed with a cold snare.                            Resected and retrieved.                           - One 5 mm polyp at the recto-sigmoid colon,                             removed with a cold snare. Resected and retrieved.                           - External hemorrhoids. Moderate Sedation:      Moderate (conscious) sedation was administered by the endoscopy nurse       and supervised by the endoscopist. The following parameters were       monitored: oxygen saturation, heart rate, blood pressure, CO2       capnography and response to care. Total physician intraservice time was       30 minutes. Recommendation:           - Patient has a contact number available for                            emergencies. The signs and symptoms of potential                            delayed complications were discussed with the                            patient. Return to normal activities tomorrow.                            Written discharge instructions were provided to the                            patient.                           - Resume previous diet today.                           - Continue present medications.                           -  No aspirin, ibuprofen, naproxen, or other                            non-steroidal anti-inflammatory drugs for 1 day.                           - Await pathology results.                           - Repeat colonoscopy is recommended. The                            colonoscopy date will be determined after pathology                            results from today's exam become available for                            review. Procedure Code(s):        --- Professional ---                           [phone], Colonoscopy, flexible; with removal of                            tumor(s), polyp(s), or other lesion(s) by snare                            technique                           99153, Moderate sedation; each additional 15                            minutes intraservice time                           G0500, Moderate sedation services provided by the                            same physician or other qualified health care                             professional performing a gastrointestinal                            endoscopic service that sedation supports,                            requiring the presence of an independent trained                            observer to assist in the monitoring of the                            patient's level of consciousness and physiological  status; initial 15 minutes of intra-service time;                            patient age 65 years or older (additional time may                            be reported with [phone], as appropriate) Diagnosis Code(s):        --- Professional ---                           K63.5, Polyp of colon                           Z12.11, Encounter for screening for malignant                            neoplasm of colon                           K64.4, Residual hemorrhoidal skin tags CPT copyright 2019 American Medical Association. All rights reserved. The codes documented in this report are preliminary and upon coder review may  be revised to meet current compliance requirements. Hildred Laser, MD Hildred Laser, MD 01/19/2021 11:02:58 AM This report has been signed electronically. Number of Addenda: 0

## 2021-01-19 NOTE — Discharge Instructions (Signed)
Resume aspirin on 01/20/2021. Resume other medications as before Resume usual diet No driving for 24 hours Physician will call with biopsy results

## 2021-01-19 NOTE — H&P (Signed)
[name] is an 70 y.o. male.   Chief Complaint: Patient is here for colonoscopy HPI: Patient is 70 year old Caucasian male who is here for screening colonoscopy.  Last exam was in 2012 with removal of 4 small polyps with these are non-adenomatous.  He denies abdominal pain change in bowel habits or rectal bleeding.  Family history is negative for colorectal carcinoma.  His sister died of ovarian carcinoma in her 57s. Last aspirin dose was 3 days ago.  Past Medical History:  Diagnosis Date   AAA (abdominal aortic aneurysm) ([place])    3.3 cm in 2012   ABDOMINAL AORTIC ANEURYSM 11/07/2009   Diameter of 3.3 cm by CT in 12/2010    Anxiety    ASCVD (arteriosclerotic cardiovascular disease)    LAD stent in 1999; RCA bare-metal stent in 12/2007, LAD stent patent, 50-60% proximal Cx lesion treated medically; 09/2012: CABG x3, LIMA to LAD, Sequential SVG to ramus intermediate and OM branch with EVH via right thigh   Coronary artery disease    Depression    Diabetes mellitus    DIABETES MELLITUS, TYPE II 08/09/2010   no insulin     ED (erectile dysfunction)    Gastric ulcer    Hepatic steatosis    Hyperlipidemia    HYPERLIPIDEMIA 08/09/2010   Lipid profile in 07/2010:144, 124, 34, 86; 01/2012:93, 67, 40, 43    Hypertension    Insomnia    MI (myocardial infarction) ([place])    Nephrolithiasis    NEPHROLITHIASIS, RECURRENT 08/09/2010   Obstruction of the ureter on CT scan of the abdomen resulting in right hydronephrosis in 12/2010; bladder outlet obstruction prior to that.  CT also revealed atherosclerotic changes in the abdominal arterial vessels, hepatic granuloma and calcification of the prostate.    S/P CABG x 3 10/07/2012   LIMA to LAD, Sequential SVG to ramus intermediate and OM branch with EVH via right thigh   THROMBOCYTOPENIA 08/09/2010   Platelets 88-114,000 over the past 4 years; prior evaluation by Dr. Tressie Stalker without a specific etiology identified.  01/2012: Normal CBC ex platelets-120     Thrombocytopenia ([place])    Chronic; evaluated by Dr. Tressie Stalker in 2007   Tobacco abuse     Past Surgical History:  Procedure Laterality Date   CORONARY ANGIOPLASTY WITH [place], 2009   CORONARY ARTERY BYPASS GRAFT N/A 10/07/2012   Procedure: CORONARY ARTERY BYPASS GRAFTING (CABG);  Surgeon: Rexene Alberts, MD;  Location: [place];  Service: Open Heart Surgery;  Laterality: N/A;   CYSTOSCOPY WITH LITHOLAPAXY N/A 10/29/2019   Procedure: CYSTOSCOPY WITH LITHOLAPAXY;  Surgeon: Irine Seal, MD;  [facility];  Service: Urology;  Laterality: N/A;   CYSTOSCOPY WITH RETROGRADE PYELOGRAM, URETEROSCOPY AND STENT PLACEMENT Left 08/30/2015   Procedure: CYSTOSCOPY LEFT RETROGRADE PYELOGRAM  LEFT   URETEROSCOPY,  LITHOLAPAXY;  Surgeon: Irine Seal, MD;  [facility];  Service: Urology;  Laterality: Left;   ESOPHAGOGASTRODUODENOSCOPY  04/04/2012   Procedure: ESOPHAGOGASTRODUODENOSCOPY (EGD);  Surgeon: Rogene Houston, MD;  [facility];  Service: Endoscopy;  Laterality: N/A;  830   ESOPHAGOGASTRODUODENOSCOPY  07/25/2012   Procedure: ESOPHAGOGASTRODUODENOSCOPY (EGD);  Surgeon: Rogene Houston, MD;  [facility];  Service: Endoscopy;  Laterality: N/A;  830   HOLMIUM LASER APPLICATION Left 5/99/3570   Procedure: HOLMIUM LASER APPLICATION;  Surgeon: Irine Seal, MD;  [facility];  Service: Urology;  Laterality: Left;   INGUINAL HERNIA REPAIR     INTRAOPERATIVE  TRANSESOPHAGEAL ECHOCARDIOGRAM N/A 10/07/2012   Procedure: INTRAOPERATIVE TRANSESOPHAGEAL ECHOCARDIOGRAM;  Surgeon: Rexene Alberts, MD;  Location: [place];  Service: Open Heart Surgery;  Laterality: N/A;   LEFT HEART CATHETERIZATION WITH CORONARY ANGIOGRAM N/A 10/03/2012   Procedure: LEFT HEART CATHETERIZATION WITH CORONARY ANGIOGRAM;  Surgeon: Minus Breeding, MD;  Location: [facility] CATH LAB;  Service: Cardiovascular;  Laterality: N/A;   LITHOTRIPSY      Family History  Problem Relation Age of Onset   Ovarian  cancer Sister    Social History:  reports that he has been smoking cigarettes. He has a 20.00 pack-year smoking history. He has never used smokeless tobacco. He reports that he does not drink alcohol and does not use drugs.  Allergies:  Allergies  Allergen Reactions   Meperidine Hcl Other (See Comments)    Was mixed with phenergan and turned blood vessels red.    Promethazine Hcl Other (See Comments)    Was mixed with the Demerol and caused veins to turn red.     Medications Prior to Admission  Medication Sig Dispense Refill   acetaminophen (TYLENOL) 500 MG tablet Take 500 mg by mouth at bedtime.     aspirin EC 81 MG tablet Take 81 mg by mouth daily.     atorvastatin (LIPITOR) 40 MG tablet TAKE 1 TABLET BY MOUTH EVERY DAY (Patient taking differently: Take 40 mg by mouth daily.) 90 tablet 3   cetirizine (ZYRTEC) 10 MG tablet Take 10 mg by mouth daily.     diphenhydramine-acetaminophen (TYLENOL PM) 25-500 MG TABS tablet Take 1 tablet by mouth at bedtime.     metFORMIN (GLUCOPHAGE) 1000 MG tablet Take 1,000 mg by mouth 2 (two) times daily.     metoprolol tartrate (LOPRESSOR) 25 MG tablet TAKE 1 TABLET BY MOUTH TWICE A DAY (Patient taking differently: Take 25 mg by mouth 2 (two) times daily.) 180 tablet 3   ramipril (ALTACE) 10 MG capsule TAKE 2 CAPSULES BY MOUTH EVERY DAY WITH BREAKFAST (Patient taking differently: Take 20 mg by mouth daily with breakfast.) 180 capsule 3   tadalafil (CIALIS) 5 MG tablet Take 5 mg by mouth daily as needed for erectile dysfunction.     tamsulosin (FLOMAX) 0.4 MG CAPS capsule TAKE 1 CAPSULE BY MOUTH EVERY DAY (Patient taking differently: Take 0.4 mg by mouth daily.) 90 capsule 3   phenazopyridine (PYRIDIUM) 200 MG tablet Take 1 tablet (200 mg total) by mouth 3 (three) times daily as needed for pain. (Patient not taking: No sig reported) 10 tablet 0   polyethylene glycol-electrolytes (TRILYTE) 420 g solution Take 4,000 mLs by mouth as directed. 4000 mL 0    tadalafil (CIALIS) 20 MG tablet Take 1 tablet as directed 30 min prior to intercourse. (Patient not taking: No sig reported) 30 tablet 6    Results for orders placed or performed during the hospital encounter of 01/19/21 (from the past 48 hour(s))  Glucose, capillary     Status: Abnormal   Collection Time: 01/19/21  9:37 AM  Result Value Ref Range   Glucose-Capillary 134 (H) 70 - 99 mg/dL    Comment: Glucose reference range applies only to samples taken after fasting for at least 8 hours.   No results found.  Review of Systems  Blood pressure (!) 157/69, temperature 98.5 F (36.9 C), temperature source Oral, resp. rate 20, height 5\' 11"  (1.803 m), weight 77.6 kg, SpO2 97 %. Physical Exam HENT:     Mouth/Throat:     Mouth: Mucous membranes are  moist.     Pharynx: Oropharynx is clear.  Eyes:     General: No scleral icterus.    Conjunctiva/sclera: Conjunctivae normal.  Cardiovascular:     Rate and Rhythm: Normal rate and regular rhythm.     Heart sounds: Normal heart sounds. No murmur heard.    Comments: Midsternal scar Pulmonary:     Effort: Pulmonary effort is normal.     Breath sounds: Normal breath sounds.  Abdominal:     General: There is no distension.     Palpations: Abdomen is soft. There is no mass.     Tenderness: There is no abdominal tenderness.  Musculoskeletal:        General: No swelling.     Cervical back: Neck supple.  Lymphadenopathy:     Cervical: No cervical adenopathy.  Skin:    General: Skin is warm and dry.  Neurological:     Mental Status: He is alert.     Assessment/Plan  Average risk screening colonoscopy  Hildred Laser, MD 01/19/2021, 10:16 AM

## 2021-01-20 LAB — SURGICAL PATHOLOGY

## 2021-01-24 ENCOUNTER — Encounter (HOSPITAL_COMMUNITY): Payer: Self-pay | Admitting: Internal Medicine

## 2021-01-25 ENCOUNTER — Encounter (INDEPENDENT_AMBULATORY_CARE_PROVIDER_SITE_OTHER): Payer: Self-pay | Admitting: *Deleted

## 2021-02-14 ENCOUNTER — Other Ambulatory Visit: Payer: Self-pay | Admitting: Cardiology

## 2021-02-23 ENCOUNTER — Other Ambulatory Visit: Payer: Self-pay | Admitting: Cardiology

## 2021-03-02 ENCOUNTER — Encounter: Payer: Self-pay | Admitting: *Deleted

## 2021-03-02 ENCOUNTER — Ambulatory Visit: Payer: Medicare HMO | Admitting: Cardiology

## 2021-03-02 ENCOUNTER — Encounter: Payer: Self-pay | Admitting: Cardiology

## 2021-03-02 VITALS — BP 118/72 | HR 67 | Ht 71.0 in | Wt 169.0 lb

## 2021-03-02 DIAGNOSIS — I10 Essential (primary) hypertension: Secondary | ICD-10-CM | POA: Diagnosis not present

## 2021-03-02 DIAGNOSIS — E782 Mixed hyperlipidemia: Secondary | ICD-10-CM | POA: Diagnosis not present

## 2021-03-02 DIAGNOSIS — I2510 Atherosclerotic heart disease of native coronary artery without angina pectoris: Secondary | ICD-10-CM

## 2021-03-02 DIAGNOSIS — I714 Abdominal aortic aneurysm, without rupture, unspecified: Secondary | ICD-10-CM

## 2021-03-02 NOTE — Progress Notes (Signed)
Clinical Summary Mr. Derda is a 70 y.o.male seen today for follow up of the following medical problems     1. CAD   - prior 3 vessel CABG in 09/2012 (LIMA to LAD, SVG to ramus, SVG to OM), history of PCI prior to that. 09/2012 LVEF 60-65% by echo     - no recent chest pain, no SOB or DOE  - compliant withmed     2. HTN    - compliant with meds - no recent orthostatic symptoms since off HCTZ.    3. HL   - 01/2018: TC 101 TG 79 HDL 43 LDL 42 - compliant with statin    4. AAA   - [place] 09/2013 3.5 x 4.5, slightly larger than prior study a year prior (3.3x3.9) - for repeat study in 2019   - no recent symptoms.   06/2019 4.3 cm aneurusm       5. PVCs -mild symptoms at time, overall not bothersome   SH:  Retired Engineer, structural.  On disabilty due to cardaic condition and chronic joint pains.    Past Medical History:  Diagnosis Date   AAA (abdominal aortic aneurysm) ([place])    3.3 cm in 2012   ABDOMINAL AORTIC ANEURYSM 11/07/2009   Diameter of 3.3 cm by CT in 12/2010    Anxiety    ASCVD (arteriosclerotic cardiovascular disease)    LAD stent in 1999; RCA bare-metal stent in 12/2007, LAD stent patent, 50-60% proximal Cx lesion treated medically; 09/2012: CABG x3, LIMA to LAD, Sequential SVG to ramus intermediate and OM Harman Ferrin with EVH via right thigh   Coronary artery disease    Depression    Diabetes mellitus    DIABETES MELLITUS, TYPE II 08/09/2010   no insulin     ED (erectile dysfunction)    Gastric ulcer    Hepatic steatosis    Hyperlipidemia    HYPERLIPIDEMIA 08/09/2010   Lipid profile in 07/2010:144, 124, 34, 86; 01/2012:93, 62, 86, 43    Hypertension    Insomnia    MI (myocardial infarction) ([place])    Nephrolithiasis    NEPHROLITHIASIS, RECURRENT 08/09/2010   Obstruction of the ureter on CT scan of the abdomen resulting in right hydronephrosis in 12/2010; bladder outlet obstruction prior to that.  CT also revealed atherosclerotic changes in the abdominal arterial  vessels, hepatic granuloma and calcification of the prostate.    S/P CABG x 3 10/07/2012   LIMA to LAD, Sequential SVG to ramus intermediate and OM Brunetta [place] with EVH via right thigh   THROMBOCYTOPENIA 08/09/2010   Platelets 88-114,000 over the past 4 years; prior evaluation by Dr. Tressie Stalker without a specific etiology identified.  01/2012: Normal CBC ex platelets-120    Thrombocytopenia ([facility])    Chronic; evaluated by Dr. Tressie Stalker in 2007   Tobacco abuse      Allergies  Allergen Reactions   Meperidine Hcl Other (See Comments)    Was mixed with phenergan and turned blood vessels red.    Promethazine Hcl Other (See Comments)    Was mixed with the Demerol and caused veins to turn red.      Current Outpatient Medications  Medication Sig Dispense Refill   acetaminophen (TYLENOL) 500 MG tablet Take 500 mg by mouth at bedtime.     aspirin EC 81 MG tablet Take 1 tablet (81 mg total) by mouth daily. 30 tablet 11   atorvastatin (LIPITOR) 40 MG tablet TAKE 1 TABLET BY MOUTH EVERY DAY 90 tablet 3  cetirizine (ZYRTEC) 10 MG tablet Take 10 mg by mouth daily.     diphenhydramine-acetaminophen (TYLENOL PM) 25-500 MG TABS tablet Take 1 tablet by mouth at bedtime.     metFORMIN (GLUCOPHAGE) 1000 MG tablet Take 1,000 mg by mouth 2 (two) times daily.     metoprolol tartrate (LOPRESSOR) 25 MG tablet TAKE 1 TABLET BY MOUTH TWICE A DAY 60 tablet 3   ramipril (ALTACE) 10 MG capsule TAKE 2 CAPSULES BY MOUTH EVERY DAY WITH BREAKFAST 180 capsule 3   tadalafil (CIALIS) 5 MG tablet Take 5 mg by mouth daily as needed for erectile dysfunction.     tamsulosin (FLOMAX) 0.4 MG CAPS capsule TAKE 1 CAPSULE BY MOUTH EVERY DAY 90 capsule 3   No current facility-administered medications for this visit.     Past Surgical History:  Procedure Laterality Date   COLONOSCOPY N/A 01/19/2021   Procedure: COLONOSCOPY;  Surgeon: Rogene Houston, MD;  [facility];  Service: Endoscopy;  Laterality: N/A;  12:30    CORONARY ANGIOPLASTY WITH STENT PLACEMENT     1999, 2009   CORONARY ARTERY BYPASS GRAFT N/A 10/07/2012   Procedure: CORONARY ARTERY BYPASS GRAFTING (CABG);  Surgeon: Rexene Alberts, MD;  Location: [place];  Service: Open Heart Surgery;  Laterality: N/A;   CYSTOSCOPY WITH LITHOLAPAXY N/A 10/29/2019   Procedure: CYSTOSCOPY WITH LITHOLAPAXY;  Surgeon: Irine Seal, MD;  [facility];  Service: Urology;  Laterality: N/A;   CYSTOSCOPY WITH RETROGRADE PYELOGRAM, URETEROSCOPY AND STENT PLACEMENT Left 08/30/2015   Procedure: CYSTOSCOPY LEFT RETROGRADE PYELOGRAM  LEFT   URETEROSCOPY,  LITHOLAPAXY;  Surgeon: Irine Seal, MD;  [facility];  Service: Urology;  Laterality: Left;   ESOPHAGOGASTRODUODENOSCOPY  04/04/2012   Procedure: ESOPHAGOGASTRODUODENOSCOPY (EGD);  Surgeon: Rogene Houston, MD;  [facility];  Service: Endoscopy;  Laterality: N/A;  830   ESOPHAGOGASTRODUODENOSCOPY  07/25/2012   Procedure: ESOPHAGOGASTRODUODENOSCOPY (EGD);  Surgeon: Rogene Houston, MD;  [facility];  Service: Endoscopy;  Laterality: N/A;  830   HOLMIUM LASER APPLICATION Left [id number]   Procedure: HOLMIUM LASER APPLICATION;  Surgeon: Irine Seal, MD;  [facility];  Service: Urology;  Laterality: Left;   INGUINAL HERNIA REPAIR     INTRAOPERATIVE TRANSESOPHAGEAL ECHOCARDIOGRAM N/A 10/07/2012   Procedure: INTRAOPERATIVE TRANSESOPHAGEAL ECHOCARDIOGRAM;  Surgeon: Rexene Alberts, MD;  Location: [place];  Service: Open Heart Surgery;  Laterality: N/A;   LEFT HEART CATHETERIZATION WITH CORONARY ANGIOGRAM N/A 10/03/2012   Procedure: LEFT HEART CATHETERIZATION WITH CORONARY ANGIOGRAM;  Surgeon: Minus Breeding, MD;  [facility];  Service: Cardiovascular;  Laterality: N/A;   LITHOTRIPSY     POLYPECTOMY  01/19/2021   Procedure: POLYPECTOMY;  Surgeon: Rogene Houston, MD;  [facility];  Service: Endoscopy;;     Allergies  Allergen Reactions   Meperidine Hcl Other (See Comments)    Was  mixed with phenergan and turned blood vessels red.    Promethazine Hcl Other (See Comments)    Was mixed with the Demerol and caused veins to turn red.       Family History  Problem Relation Age of Onset   Ovarian cancer Sister      Social History Mr. Fikes reports that he has been smoking cigarettes. He has a 20.00 pack-year smoking history. He has never used smokeless tobacco. Mr. Boling reports no history of alcohol use.   Review of Systems CONSTITUTIONAL: No weight loss, fever, chills, weakness or fatigue.  HEENT: Eyes:  No visual loss, blurred vision, double vision or yellow sclerae.No hearing loss, sneezing, congestion, runny nose or sore throat.  SKIN: No rash or itching.  CARDIOVASCULAR: per hpi RESPIRATORY: No shortness of breath, cough or sputum.  GASTROINTESTINAL: No anorexia, nausea, vomiting or diarrhea. No abdominal pain or blood.  GENITOURINARY: No burning on urination, no polyuria NEUROLOGICAL: No headache, dizziness, syncope, paralysis, ataxia, numbness or tingling in the extremities. No change in bowel or bladder control.  MUSCULOSKELETAL: No muscle, back pain, joint pain or stiffness.  LYMPHATICS: No enlarged nodes. No history of splenectomy.  PSYCHIATRIC: No history of depression or anxiety.  ENDOCRINOLOGIC: No reports of sweating, cold or heat intolerance. No polyuria or polydipsia.  Marland Kitchen   Physical Examination Today's Vitals   03/02/21 1057  BP: 118/72  Pulse: 67  SpO2: 97%  Weight: 169 lb (76.7 kg)  Height: '5\' 11"'$  (1.803 m)   Body mass index is 23.57 kg/m.  Gen: resting comfortably, no acute distress HEENT: no scleral icterus, pupils equal round and reactive, no palptable cervical adenopathy,  CV: RRR, no mr/g no jvd Resp: Clear to auscultation bilaterally GI: abdomen is soft, non-tender, non-distended, normal bowel sounds, no hepatosplenomegaly MSK: extremities are warm, no edema.  Skin: warm, no rash Neuro:  no focal deficits Psych:  appropriate affect   Diagnostic Studies  09/2012 Echo: LVEF 55-60%, no WMAs,    09/2012 AAA [place]: 3.3 x 3.9 cm, stable from prior CT   08/2013 AAA [place] Infrarenal abdominal aortic aneurysm measuring 3.5 x 4.5 cm.   Previous examination of 10/06/2012 demonstrated the abdominal aorta   measuring 3.3 x 3.9 cm.   08/2014 AAA US IMPRESSION: 4.4 cm infrarenal abdominal aortic aneurysm. Recommend followup by [place] in 3 years. This recommendation follows ACR consensus guidelines: White Paper of the ACR Incidental Findings Committee II on Vascular Findings. J Am Coll Radiol 2013; [id number]   Assessment and Plan   1. CAD   - no recent symptoms, continue current meds - EKG shows SR chronic ST/T changes, occasional PVCs   2. HTN   - at goal, cntinue current meds   3. Hyperlipidemia - request labs from pcp, continue atorvastatin   4. PVCs - asymptomatic, suspect related to history of CAD - continue to monitor, continue metoprolol   4. AAA   -will repeat his AAA [place]     Arnoldo Lenis, M.D.

## 2021-03-02 NOTE — Patient Instructions (Signed)
Medication Instructions:  Your physician recommends that you continue on your current medications as directed. Please refer to the Current Medication list given to you today.  *If you need a refill on your cardiac medications before your next appointment, please call your pharmacy*   Lab Work: NONE   If you have labs (blood work) drawn today and your tests are completely normal, you will receive your results only by: [place] (if you have MyChart) OR A paper copy in the mail If you have any lab test that is abnormal or we need to change your treatment, we will call you to review the results.   Testing/Procedures: Your physician has requested that you have an abdominal aorta duplex. During this test, an ultrasound is used to evaluate the aorta. Allow 30 minutes for this exam. Do not eat after midnight the day before and avoid carbonated beverages    Follow-Up: At [facility], you and your health needs are our priority.  As part of our continuing mission to provide you with exceptional heart care, we have created designated Provider Care Teams.  These Care Teams include your primary Cardiologist (physician) and Advanced Practice Providers (APPs -  Physician Assistants and Nurse Practitioners) who all work together to provide you with the care you need, when you need it.  We recommend signing up for the patient portal called "MyChart".  Sign up information is provided on this After Visit Summary.  MyChart is used to connect with patients for Virtual Visits (Telemedicine).  Patients are able to view lab/test results, encounter notes, upcoming appointments, etc.  Non-urgent messages can be sent to your provider as well.   To learn more about what you can do with MyChart, go to [email or web address].    Your next appointment:   6 month(s)  The format for your next appointment:   In Person  Provider:   Carlyle Dolly, MD   Other Instructions Thank you for choosing [facility]!

## 2021-03-14 ENCOUNTER — Other Ambulatory Visit: Payer: Self-pay | Admitting: Cardiology

## 2021-03-15 ENCOUNTER — Other Ambulatory Visit: Payer: Self-pay

## 2021-03-15 ENCOUNTER — Observation Stay (HOSPITAL_COMMUNITY): Payer: Medicare HMO

## 2021-03-15 ENCOUNTER — Emergency Department (HOSPITAL_COMMUNITY): Payer: Medicare HMO

## 2021-03-15 ENCOUNTER — Encounter: Payer: Self-pay | Admitting: Emergency Medicine

## 2021-03-15 ENCOUNTER — Encounter (HOSPITAL_COMMUNITY): Payer: Self-pay | Admitting: Emergency Medicine

## 2021-03-15 ENCOUNTER — Inpatient Hospital Stay (HOSPITAL_COMMUNITY)
Admission: EM | Admit: 2021-03-15 | Discharge: 2021-03-18 | DRG: 195 | Disposition: A | Discharge status: Home / Self Care | Payer: Medicare HMO | Attending: Internal Medicine | Admitting: Internal Medicine

## 2021-03-15 ENCOUNTER — Ambulatory Visit
Admission: EM | Admit: 2021-03-15 | Discharge: 2021-03-15 | Disposition: A | Discharge status: Home / Self Care | Payer: Medicare HMO | Attending: Family Medicine | Admitting: Family Medicine

## 2021-03-15 DIAGNOSIS — F17210 Nicotine dependence, cigarettes, uncomplicated: Secondary | ICD-10-CM | POA: Diagnosis present

## 2021-03-15 DIAGNOSIS — N1831 Chronic kidney disease, stage 3a: Secondary | ICD-10-CM | POA: Diagnosis present

## 2021-03-15 DIAGNOSIS — Z79899 Other long term (current) drug therapy: Secondary | ICD-10-CM

## 2021-03-15 DIAGNOSIS — I129 Hypertensive chronic kidney disease with stage 1 through stage 4 chronic kidney disease, or unspecified chronic kidney disease: Secondary | ICD-10-CM | POA: Diagnosis present

## 2021-03-15 DIAGNOSIS — Y92009 Unspecified place in unspecified non-institutional (private) residence as the place of occurrence of the external cause: Secondary | ICD-10-CM

## 2021-03-15 DIAGNOSIS — Z833 Family history of diabetes mellitus: Secondary | ICD-10-CM

## 2021-03-15 DIAGNOSIS — Z7984 Long term (current) use of oral hypoglycemic drugs: Secondary | ICD-10-CM

## 2021-03-15 DIAGNOSIS — I10 Essential (primary) hypertension: Secondary | ICD-10-CM

## 2021-03-15 DIAGNOSIS — I252 Old myocardial infarction: Secondary | ICD-10-CM

## 2021-03-15 DIAGNOSIS — R2681 Unsteadiness on feet: Secondary | ICD-10-CM | POA: Diagnosis not present

## 2021-03-15 DIAGNOSIS — G4700 Insomnia, unspecified: Secondary | ICD-10-CM | POA: Diagnosis present

## 2021-03-15 DIAGNOSIS — N400 Enlarged prostate without lower urinary tract symptoms: Secondary | ICD-10-CM | POA: Diagnosis not present

## 2021-03-15 DIAGNOSIS — R42 Dizziness and giddiness: Secondary | ICD-10-CM

## 2021-03-15 DIAGNOSIS — F32A Depression, unspecified: Secondary | ICD-10-CM | POA: Diagnosis present

## 2021-03-15 DIAGNOSIS — R29818 Other symptoms and signs involving the nervous system: Secondary | ICD-10-CM | POA: Diagnosis not present

## 2021-03-15 DIAGNOSIS — E1169 Type 2 diabetes mellitus with other specified complication: Secondary | ICD-10-CM

## 2021-03-15 DIAGNOSIS — R1110 Vomiting, unspecified: Secondary | ICD-10-CM

## 2021-03-15 DIAGNOSIS — R6883 Chills (without fever): Secondary | ICD-10-CM | POA: Diagnosis not present

## 2021-03-15 DIAGNOSIS — J189 Pneumonia, unspecified organism: Principal | ICD-10-CM | POA: Diagnosis present

## 2021-03-15 DIAGNOSIS — R0602 Shortness of breath: Secondary | ICD-10-CM | POA: Diagnosis not present

## 2021-03-15 DIAGNOSIS — E1122 Type 2 diabetes mellitus with diabetic chronic kidney disease: Secondary | ICD-10-CM | POA: Diagnosis present

## 2021-03-15 DIAGNOSIS — Z951 Presence of aortocoronary bypass graft: Secondary | ICD-10-CM

## 2021-03-15 DIAGNOSIS — Z955 Presence of coronary angioplasty implant and graft: Secondary | ICD-10-CM

## 2021-03-15 DIAGNOSIS — J3489 Other specified disorders of nose and nasal sinuses: Secondary | ICD-10-CM | POA: Diagnosis not present

## 2021-03-15 DIAGNOSIS — Z20822 Contact with and (suspected) exposure to covid-19: Secondary | ICD-10-CM | POA: Diagnosis present

## 2021-03-15 DIAGNOSIS — R531 Weakness: Secondary | ICD-10-CM

## 2021-03-15 DIAGNOSIS — Z888 Allergy status to other drugs, medicaments and biological substances status: Secondary | ICD-10-CM

## 2021-03-15 DIAGNOSIS — W19XXXA Unspecified fall, initial encounter: Secondary | ICD-10-CM | POA: Diagnosis present

## 2021-03-15 DIAGNOSIS — F419 Anxiety disorder, unspecified: Secondary | ICD-10-CM | POA: Diagnosis present

## 2021-03-15 DIAGNOSIS — E785 Hyperlipidemia, unspecified: Secondary | ICD-10-CM | POA: Diagnosis not present

## 2021-03-15 DIAGNOSIS — I2510 Atherosclerotic heart disease of native coronary artery without angina pectoris: Secondary | ICD-10-CM | POA: Diagnosis present

## 2021-03-15 DIAGNOSIS — Z7982 Long term (current) use of aspirin: Secondary | ICD-10-CM

## 2021-03-15 DIAGNOSIS — R778 Other specified abnormalities of plasma proteins: Secondary | ICD-10-CM | POA: Diagnosis present

## 2021-03-15 LAB — URINALYSIS, ROUTINE W REFLEX MICROSCOPIC
Bilirubin Urine: NEGATIVE
Glucose, UA: NEGATIVE mg/dL
Ketones, ur: 5 mg/dL — AB
Leukocytes,Ua: NEGATIVE
Nitrite: NEGATIVE
Protein, ur: >=300 mg/dL — AB
Specific Gravity, Urine: 1.027 (ref 1.005–1.030)
pH: 5 (ref 5.0–8.0)

## 2021-03-15 LAB — CBG MONITORING, ED
Glucose-Capillary: 140 mg/dL — ABNORMAL HIGH (ref 70–99)
Glucose-Capillary: 142 mg/dL — ABNORMAL HIGH (ref 70–99)

## 2021-03-15 LAB — CBC WITH DIFFERENTIAL/PLATELET
Abs Immature Granulocytes: 0.07 10*3/uL (ref 0.00–0.07)
Basophils Absolute: 0 10*3/uL (ref 0.0–0.1)
Basophils Relative: 0 %
Eosinophils Absolute: 0 10*3/uL (ref 0.0–0.5)
Eosinophils Relative: 0 %
HCT: 46.3 % (ref 39.0–52.0)
Hemoglobin: 14.7 g/dL (ref 13.0–17.0)
Immature Granulocytes: 1 %
Lymphocytes Relative: 5 %
Lymphs Abs: 0.5 10*3/uL — ABNORMAL LOW (ref 0.7–4.0)
MCH: 29.3 pg (ref 26.0–34.0)
MCHC: 31.7 g/dL (ref 30.0–36.0)
MCV: 92.4 fL (ref 80.0–100.0)
Monocytes Absolute: 0.7 10*3/uL (ref 0.1–1.0)
Monocytes Relative: 6 %
Neutro Abs: 9.6 10*3/uL — ABNORMAL HIGH (ref 1.7–7.7)
Neutrophils Relative %: 88 %
Platelets: 71 10*3/uL — ABNORMAL LOW (ref 150–400)
RBC: 5.01 MIL/uL (ref 4.22–5.81)
RDW: 13.7 % (ref 11.5–15.5)
WBC: 10.9 10*3/uL — ABNORMAL HIGH (ref 4.0–10.5)
nRBC: 0 % (ref 0.0–0.2)

## 2021-03-15 LAB — RESP PANEL BY RT-PCR (FLU A&B, COVID) ARPGX2
Influenza A by PCR: NEGATIVE
Influenza B by PCR: NEGATIVE
SARS Coronavirus 2 by RT PCR: NEGATIVE

## 2021-03-15 LAB — POCT FASTING CBG KUC MANUAL ENTRY: POCT Glucose (KUC): 142 mg/dL — AB (ref 70–99)

## 2021-03-15 LAB — COMPREHENSIVE METABOLIC PANEL
ALT: 32 U/L (ref 0–44)
AST: 25 U/L (ref 15–41)
Albumin: 4.2 g/dL (ref 3.5–5.0)
Alkaline Phosphatase: 75 U/L (ref 38–126)
Anion gap: 12 (ref 5–15)
BUN: 24 mg/dL — ABNORMAL HIGH (ref 8–23)
CO2: 22 mmol/L (ref 22–32)
Calcium: 9.2 mg/dL (ref 8.9–10.3)
Chloride: 102 mmol/L (ref 98–111)
Creatinine, Ser: 1.35 mg/dL — ABNORMAL HIGH (ref 0.61–1.24)
GFR, Estimated: 58 mL/min — ABNORMAL LOW (ref >60)
Glucose, Bld: 155 mg/dL — ABNORMAL HIGH (ref 70–99)
Potassium: 4 mmol/L (ref 3.5–5.1)
Sodium: 136 mmol/L (ref 135–145)
Total Bilirubin: 1.5 mg/dL — ABNORMAL HIGH (ref 0.3–1.2)
Total Protein: 7.6 g/dL (ref 6.5–8.1)

## 2021-03-15 LAB — TROPONIN I (HIGH SENSITIVITY)
Troponin I (High Sensitivity): 31 ng/L — ABNORMAL HIGH (ref <18)
Troponin I (High Sensitivity): 37 ng/L — ABNORMAL HIGH (ref <18)

## 2021-03-15 LAB — HEMOGLOBIN A1C
Hgb A1c MFr Bld: 5.8 % — ABNORMAL HIGH (ref 4.8–5.6)
Mean Plasma Glucose: 119.76 mg/dL

## 2021-03-15 LAB — HIV ANTIBODY (ROUTINE TESTING W REFLEX): HIV Screen 4th Generation wRfx: NONREACTIVE

## 2021-03-15 MED ORDER — ATORVASTATIN CALCIUM 40 MG PO TABS
40.0000 mg | ORAL_TABLET | Freq: Every day | ORAL | Status: DC
  Administered 2021-03-16 – 2021-03-18 (×3): 40 mg via ORAL
  Filled 2021-03-15 (×3): qty 1

## 2021-03-15 MED ORDER — ENOXAPARIN SODIUM 40 MG/0.4ML IJ SOSY
40.0000 mg | PREFILLED_SYRINGE | INTRAMUSCULAR | Status: DC
  Administered 2021-03-15 – 2021-03-16 (×2): 40 mg via SUBCUTANEOUS
  Filled 2021-03-15 (×3): qty 0.4

## 2021-03-15 MED ORDER — AZITHROMYCIN 250 MG PO TABS
500.0000 mg | ORAL_TABLET | Freq: Every day | ORAL | Status: DC
  Administered 2021-03-16 – 2021-03-18 (×3): 500 mg via ORAL
  Filled 2021-03-15 (×3): qty 2

## 2021-03-15 MED ORDER — SODIUM CHLORIDE 0.9 % IV SOLN
2.0000 g | INTRAVENOUS | Status: DC
  Administered 2021-03-16 – 2021-03-18 (×3): 2 g via INTRAVENOUS
  Filled 2021-03-15 (×3): qty 20

## 2021-03-15 MED ORDER — ASPIRIN EC 81 MG PO TBEC
81.0000 mg | DELAYED_RELEASE_TABLET | Freq: Every day | ORAL | Status: DC
  Administered 2021-03-16 – 2021-03-18 (×3): 81 mg via ORAL
  Filled 2021-03-15 (×3): qty 1

## 2021-03-15 MED ORDER — IPRATROPIUM-ALBUTEROL 0.5-2.5 (3) MG/3ML IN SOLN: 3.0000 mL | Freq: Four times a day (QID) | RESPIRATORY_TRACT | Status: DC | PRN

## 2021-03-15 MED ORDER — FLUTICASONE PROPIONATE 50 MCG/ACT NA SUSP
1.0000 | Freq: Every day | NASAL | Status: DC
  Administered 2021-03-16 – 2021-03-18 (×3): 1 via NASAL
  Filled 2021-03-15: qty 16

## 2021-03-15 MED ORDER — ATORVASTATIN CALCIUM 40 MG PO TABS: 40.0000 mg | ORAL_TABLET | Freq: Every day | ORAL | Status: DC

## 2021-03-15 MED ORDER — INSULIN ASPART 100 UNIT/ML IJ SOLN: 0.0000 [IU] | Freq: Every day | INTRAMUSCULAR | Status: DC

## 2021-03-15 MED ORDER — TAMSULOSIN HCL 0.4 MG PO CAPS
0.4000 mg | ORAL_CAPSULE | Freq: Every day | ORAL | Status: DC
  Administered 2021-03-15 – 2021-03-18 (×4): 0.4 mg via ORAL
  Filled 2021-03-15 (×4): qty 1

## 2021-03-15 MED ORDER — METOPROLOL TARTRATE 25 MG PO TABS
25.0000 mg | ORAL_TABLET | Freq: Two times a day (BID) | ORAL | Status: DC
  Administered 2021-03-15 – 2021-03-18 (×6): 25 mg via ORAL
  Filled 2021-03-15 (×6): qty 1

## 2021-03-15 MED ORDER — INSULIN ASPART 100 UNIT/ML IJ SOLN
0.0000 [IU] | Freq: Three times a day (TID) | INTRAMUSCULAR | Status: DC
  Administered 2021-03-16 – 2021-03-18 (×4): 2 [IU] via SUBCUTANEOUS

## 2021-03-15 MED ORDER — FLUTICASONE PROPIONATE 50 MCG/ACT NA SUSP: 1.0000 | Freq: Every day | NASAL | Status: DC

## 2021-03-15 MED ORDER — ASPIRIN EC 81 MG PO TBEC: 81.0000 mg | DELAYED_RELEASE_TABLET | Freq: Every day | ORAL | Status: DC

## 2021-03-15 MED ORDER — ONDANSETRON HCL 4 MG/2ML IJ SOLN
4.0000 mg | Freq: Once | INTRAMUSCULAR | Status: AC
  Administered 2021-03-15: 4 mg via INTRAVENOUS
  Filled 2021-03-15: qty 2

## 2021-03-15 MED ORDER — ACETAMINOPHEN 650 MG RE SUPP: 650.0000 mg | Freq: Four times a day (QID) | RECTAL | Status: DC | PRN

## 2021-03-15 MED ORDER — LORATADINE 10 MG PO TABS
10.0000 mg | ORAL_TABLET | Freq: Every day | ORAL | Status: DC
  Administered 2021-03-16 – 2021-03-18 (×3): 10 mg via ORAL
  Filled 2021-03-15 (×3): qty 1

## 2021-03-15 MED ORDER — IBUPROFEN 400 MG PO TABS
600.0000 mg | ORAL_TABLET | Freq: Once | ORAL | Status: AC
  Administered 2021-03-15: 600 mg via ORAL
  Filled 2021-03-15: qty 2

## 2021-03-15 MED ORDER — LORATADINE 10 MG PO TABS: 10.0000 mg | ORAL_TABLET | Freq: Every day | ORAL | Status: DC

## 2021-03-15 MED ORDER — SODIUM CHLORIDE 0.9 % IV SOLN
1.0000 g | Freq: Once | INTRAVENOUS | Status: AC
  Administered 2021-03-15: 1 g via INTRAVENOUS
  Filled 2021-03-15: qty 10

## 2021-03-15 MED ORDER — LACTATED RINGERS IV SOLN: INTRAVENOUS | Status: DC

## 2021-03-15 MED ORDER — ONDANSETRON HCL 4 MG PO TABS: 4.0000 mg | ORAL_TABLET | Freq: Four times a day (QID) | ORAL | Status: DC | PRN

## 2021-03-15 MED ORDER — ONDANSETRON HCL 4 MG/2ML IJ SOLN: 4.0000 mg | Freq: Four times a day (QID) | INTRAMUSCULAR | Status: DC | PRN

## 2021-03-15 MED ORDER — SODIUM CHLORIDE 0.9 % IV BOLUS
500.0000 mL | Freq: Once | INTRAVENOUS | Status: AC
  Administered 2021-03-15: 500 mL via INTRAVENOUS

## 2021-03-15 MED ORDER — SODIUM CHLORIDE 0.9 % IV SOLN
500.0000 mg | INTRAVENOUS | Status: DC
  Administered 2021-03-15: 500 mg via INTRAVENOUS
  Filled 2021-03-15: qty 500

## 2021-03-15 MED ORDER — ACETAMINOPHEN 325 MG PO TABS
650.0000 mg | ORAL_TABLET | Freq: Four times a day (QID) | ORAL | Status: DC | PRN
  Administered 2021-03-15 – 2021-03-18 (×6): 650 mg via ORAL
  Filled 2021-03-15 (×6): qty 2

## 2021-03-15 MED ORDER — NICOTINE 14 MG/24HR TD PT24
14.0000 mg | MEDICATED_PATCH | Freq: Every day | TRANSDERMAL | Status: DC
  Administered 2021-03-16 – 2021-03-18 (×3): 14 mg via TRANSDERMAL
  Filled 2021-03-15 (×3): qty 1

## 2021-03-15 NOTE — Discharge Instructions (Signed)
BP (!) 141/78 (BP Location: Right Arm)   Pulse 73   Temp 99.9 F (37.7 C) (Oral)   Resp 18   SpO2 95%   Labs Reviewed  POCT FASTING CBG [facility] MANUAL ENTRY - Abnormal; Notable for the following components:      Result Value   POCT Glucose ([facility]) 142 (*)    All other components within normal limits

## 2021-03-15 NOTE — ED Provider Notes (Signed)
Emergency Department Provider Note   I have reviewed the triage vital signs and the nursing notes.   HISTORY  Chief Complaint Fever   HPI [name] is a 70 y.o. male with past medical history reviewed below presents to the emergency department with fever, chills, malaise, lightheadedness with standing.  Patient has had some associated cough with nasal congestion.  Symptoms have been ongoing and progressively worsening over the past several days.  He did have 1 episode of vomiting earlier today but denies diarrhea.  Denies abdominal pain or chest discomfort.  He feels somewhat lightheaded with standing.  His appetite has been poor but he has been drinking fluids.  He denies headache or sore throat.  He took a home COVID test yesterday which was negative.  Initially presented to urgent care this morning but was directed to the emergency department.  Last took Tylenol at 6 AM today. Denies sick contacts.    Past Medical History:  Diagnosis Date   AAA (abdominal aortic aneurysm) ([place])    3.3 cm in 2012   ABDOMINAL AORTIC ANEURYSM 11/07/2009   Diameter of 3.3 cm by CT in 12/2010    Anxiety    ASCVD (arteriosclerotic cardiovascular disease)    LAD stent in 1999; RCA bare-metal stent in 12/2007, LAD stent patent, 50-60% proximal Cx lesion treated medically; 09/2012: CABG x3, LIMA to LAD, Sequential SVG to ramus intermediate and OM branch with EVH via right thigh   Coronary artery disease    Depression    Diabetes mellitus    DIABETES MELLITUS, TYPE II 08/09/2010   no insulin     ED (erectile dysfunction)    Gastric ulcer    Hepatic steatosis    Hyperlipidemia    HYPERLIPIDEMIA 08/09/2010   Lipid profile in 07/2010:144, 124, 34, 86; 01/2012:93, 72, 60, 43    Hypertension    Insomnia    MI (myocardial infarction) ([place])    Nephrolithiasis    NEPHROLITHIASIS, RECURRENT 08/09/2010   Obstruction of the ureter on CT scan of the abdomen resulting in right hydronephrosis in 12/2010;  bladder outlet obstruction prior to that.  CT also revealed atherosclerotic changes in the abdominal arterial vessels, hepatic granuloma and calcification of the prostate.    S/P CABG x 3 10/07/2012   LIMA to LAD, Sequential SVG to ramus intermediate and OM branch with EVH via right thigh   THROMBOCYTOPENIA 08/09/2010   Platelets 88-114,000 over the past 4 years; prior evaluation by Dr. Tressie Stalker without a specific etiology identified.  01/2012: Normal CBC ex platelets-120    Thrombocytopenia ([place])    Chronic; evaluated by Dr. Tressie Stalker in 2007   Tobacco abuse     Patient Active Problem List   Diagnosis Date Noted   CAP (community acquired pneumonia) 03/15/2021   Ureteral stone 07/09/2013   Hypertension 10/02/2012   DIABETES MELLITUS, TYPE II 08/09/2010   HYPERLIPIDEMIA 08/09/2010   THROMBOCYTOPENIA 08/09/2010   Arteriosclerotic cardiovascular disease (ASCVD) 08/09/2010   NEPHROLITHIASIS, RECURRENT 08/09/2010   ERECTILE DYSFUNCTION, ORGANIC 08/09/2010   INSOMNIA 08/09/2010   Abdominal aortic aneurysm ([place]) 11/07/2009    Past Surgical History:  Procedure Laterality Date   COLONOSCOPY N/A 01/19/2021   Procedure: COLONOSCOPY;  Surgeon: Rogene Houston, MD;  [facility];  Service: Endoscopy;  Laterality: N/A;  12:30   CORONARY ANGIOPLASTY WITH STENT PLACEMENT     1999, 2009   CORONARY ARTERY BYPASS GRAFT N/A 10/07/2012   Procedure: CORONARY ARTERY BYPASS GRAFTING (CABG);  Surgeon: Braulio Conte  Keturah Barre, MD;  Location: [place];  Service: Open Heart Surgery;  Laterality: N/A;   CYSTOSCOPY WITH LITHOLAPAXY N/A 10/29/2019   Procedure: CYSTOSCOPY WITH LITHOLAPAXY;  Surgeon: Irine Seal, MD;  Location: [facility];  Service: Urology;  Laterality: N/A;   CYSTOSCOPY WITH RETROGRADE PYELOGRAM, URETEROSCOPY AND STENT PLACEMENT Left 08/30/2015   Procedure: CYSTOSCOPY LEFT RETROGRADE PYELOGRAM  LEFT   URETEROSCOPY,  LITHOLAPAXY;  Surgeon: Irine Seal, MD;  Location: [facility];  Service: Urology;   Laterality: Left;   ESOPHAGOGASTRODUODENOSCOPY  04/04/2012   Procedure: ESOPHAGOGASTRODUODENOSCOPY (EGD);  Surgeon: Rogene Houston, MD;  [facility];  Service: Endoscopy;  Laterality: N/A;  830   ESOPHAGOGASTRODUODENOSCOPY  07/25/2012   Procedure: ESOPHAGOGASTRODUODENOSCOPY (EGD);  Surgeon: Rogene Houston, MD;  [facility];  Service: Endoscopy;  Laterality: N/A;  830   HOLMIUM LASER APPLICATION Left [id number]   Procedure: HOLMIUM LASER APPLICATION;  Surgeon: Irine Seal, MD;  Location[facility];  Service: Urology;  Laterality: Left;   INGUINAL HERNIA REPAIR     INTRAOPERATIVE TRANSESOPHAGEAL ECHOCARDIOGRAM N/A 10/07/2012   Procedure: INTRAOPERATIVE TRANSESOPHAGEAL ECHOCARDIOGRAM;  Surgeon: Rexene Alberts, MD;  Location: [place];  Service: Open Heart Surgery;  Laterality: N/A;   LEFT HEART CATHETERIZATION WITH CORONARY ANGIOGRAM N/A 10/03/2012   Procedure: LEFT HEART CATHETERIZATION WITH CORONARY ANGIOGRAM;  Surgeon: Minus Breeding, MD;  Location: [facility] CATH [facility];  Service: Cardiovascular;  Laterality: N/A;   LITHOTRIPSY     POLYPECTOMY  01/19/2021   Procedure: POLYPECTOMY;  Surgeon: Rogene Houston, MD;  [facility];  Service: Endoscopy;;    Allergies Meperidine hcl and Promethazine hcl  Family History  Problem Relation Age of Onset   Diabetes Mother    Ovarian cancer Sister     Social History Social History   Tobacco Use   Smoking status: Every Day    Packs/day: 0.50    Years: 40.00    Pack years: 20.00    Types: Cigarettes    Last attempt to quit: 07/09/2013    Years since quitting: 7.6   Smokeless tobacco: Never   Tobacco comments:    6-7 cigs. daily   Vaping Use   Vaping Use: Never used  Substance Use Topics   Alcohol use: No    Alcohol/week: 0.0 standard drinks   Drug use: No    Review of Systems  Constitutional: Positive fever/chills and malaise.  Eyes: No visual changes. ENT: No sore throat. Cardiovascular: Denies chest pain. Positive  lightheadedness.  Respiratory: Denies shortness of breath. Gastrointestinal: No abdominal pain.  Positive vomiting x 1 this AM.  No diarrhea.  No constipation. Genitourinary: Negative for dysuria. Musculoskeletal: Negative for back pain. Skin: Negative for rash. Neurological: Negative for headaches, focal weakness or numbness.  10-point ROS otherwise negative.  ____________________________________________   PHYSICAL EXAM:  VITAL SIGNS: ED Triage Vitals  Enc Vitals Group     BP 03/15/21 0930 (!) 156/80     Pulse Rate 03/15/21 0930 77     Resp 03/15/21 0931 20     Temp 03/15/21 0927 100.2 F (37.9 C)     Temp Source 03/15/21 0927 Oral     SpO2 03/15/21 0930 94 %     Weight 03/15/21 0927 168 lb 14 oz (76.6 kg)     Height 03/15/21 0927 '5\' 11"'$  (1.803 m)    Constitutional: Alert and oriented. Well appearing and in no acute distress. Eyes: Conjunctivae are normal. Head: Atraumatic. Nose: No congestion/rhinnorhea. Mouth/Throat: Mucous membranes are moist.  Neck: No stridor.  Cardiovascular: Normal rate, regular rhythm. Good peripheral circulation. Grossly normal heart sounds.   Respiratory: Normal respiratory effort.  No retractions. Lungs CTAB. Gastrointestinal: Soft and nontender. No distention.  Musculoskeletal: No lower extremity tenderness nor edema. No gross deformities of extremities. Neurologic:  Normal speech and language. No gross focal neurologic deficits are appreciated.  Skin:  Skin is warm, dry and intact. No rash noted.   ____________________________________________   LABS (all labs ordered are listed, but only abnormal results are displayed)  Labs Reviewed  COMPREHENSIVE METABOLIC PANEL - Abnormal; Notable for the following components:      Result Value   Glucose, Bld 155 (*)    BUN 24 (*)    Creatinine, Ser 1.35 (*)    Total Bilirubin 1.5 (*)    GFR, Estimated 58 (*)    All other components within normal limits  CBC WITH DIFFERENTIAL/PLATELET -  Abnormal; Notable for the following components:   WBC 10.9 (*)    Platelets 71 (*)    Neutro Abs 9.6 (*)    Lymphs Abs 0.5 (*)    All other components within normal limits  URINALYSIS, ROUTINE W REFLEX MICROSCOPIC - Abnormal; Notable for the following components:   Color, Urine AMBER (*)    APPearance HAZY (*)    Hgb urine dipstick SMALL (*)    Ketones, ur 5 (*)    Protein, ur >=300 (*)    Bacteria, UA FEW (*)    All other components within normal limits  TROPONIN I (HIGH SENSITIVITY) - Abnormal; Notable for the following components:   Troponin I (High Sensitivity) 31 (*)    All other components within normal limits  TROPONIN I (HIGH SENSITIVITY) - Abnormal; Notable for the following components:   Troponin I (High Sensitivity) 37 (*)    All other components within normal limits  RESP PANEL BY RT-PCR (FLU A&B, COVID) ARPGX2  CULTURE, BLOOD (ROUTINE X 2)  CULTURE, BLOOD (ROUTINE X 2)   ____________________________________________  EKG   EKG Interpretation  Date/Time:  Wednesday March 15 2021 13:58:10 EDT Ventricular Rate:  89 PR Interval:  150 QRS Duration: 90 QT Interval:  424 QTC Calculation: 391 R Axis:   51 Text Interpretation: Sinus rhythm Ventricular bigeminy Probable LVH with secondary repol abnrm Confirmed by Nanda Quinton [phone]) on 03/15/2021 2:12:38 PM        ____________________________________________  RADIOLOGY  DG Chest Portable 1 View  Result Date: 03/15/2021 CLINICAL DATA:  Shortness of breath, chills, congestion EXAM: PORTABLE CHEST 1 VIEW COMPARISON:  11/10/2012 FINDINGS: Heart is normal size. Prior CABG. Right perihilar airspace opacity. No confluent opacity on the left. No effusions or acute bony abnormality. IMPRESSION: Right perihilar airspace opacity concerning for pneumonia. Followup PA and lateral chest X-ray is recommended in 3-4 weeks following trial of antibiotic therapy to ensure resolution and exclude underlying malignancy. Prior CABG.  Electronically Signed   By: Rolm Baptise M.D.   On: 03/15/2021 10:05    ____________________________________________   PROCEDURES  Procedure(s) performed:   Procedures   ____________________________________________   INITIAL IMPRESSION / ASSESSMENT AND PLAN / ED COURSE  Pertinent labs & imaging results that were available during my care of the patient were reviewed by me and considered in my medical decision making (see chart for details).   Patient presents to the emergency department with flulike symptoms, malaise, vomiting x1 this morning.  No chest pain, pleuritic or otherwise, to suspect ACS or PE.  Patient by vital signs does not  appear to have SIRS. Doubt sepsis clinically. Most of the patient's infectious symptoms are respiratory but does have vomiting episode history this AM. Abdomen is completely soft and non-tender. Doubt acute surgical process in the abdomen. Plan for IVF, orthostatics, COVID/flu testing, and labs with weakness and vomiting this AM. Borderline fever this AM. Plan for Motrin here and Zofran.   Patient's evaluation is consistent with community-acquired pneumonia.  His labs show mild elevated BUN but no significant acute kidney injury.  He does have a leukocytosis with borderline fever.  Patient is not hypoxemic but in getting him up to walk to assess hypoxemia with ambulation he becomes very lightheaded.  He requires some assistance.  He has no focal neurologic deficit to strongly suspect stroke.  In speaking with the hospitalist will obtain a CT scan of the head.   EKG reviewed initially with sinus rhythm.  He has had some irregularity, possible A. fib but not clear that this is causing him particular symptoms.  The nurse captured the EKG when noticing this on monitor but patient not symptomatic at the time.  Had additional EKG later in the ED presentation with ventricular bigeminy.  Discussed patient's case with [facility] to request admission. Patient and family (if  present) updated with plan. Care transferred to [facility] service.  I reviewed all nursing notes, vitals, pertinent old records, EKGs, labs, imaging (as available).  ____________________________________________  FINAL CLINICAL IMPRESSION(S) / ED DIAGNOSES  Final diagnoses:  Community acquired pneumonia of right middle lobe of lung  Weakness     MEDICATIONS GIVEN DURING THIS VISIT:  Medications  azithromycin (ZITHROMAX) 500 mg in sodium chloride 0.9 % 250 mL IVPB (0 mg Intravenous Stopped 03/15/21 1256)  lactated ringers infusion ( Intravenous New Bag/Given 03/15/21 1241)  enoxaparin (LOVENOX) injection 40 mg (40 mg Subcutaneous Given 03/15/21 1359)  sodium chloride 0.9 % bolus 500 mL (0 mLs Intravenous Stopped 03/15/21 1226)  ibuprofen (ADVIL) tablet 600 mg (600 mg Oral Given 03/15/21 1008)  ondansetron (ZOFRAN) injection 4 mg (4 mg Intravenous Given 03/15/21 1007)  cefTRIAXone (ROCEPHIN) 1 g in sodium chloride 0.9 % 100 mL IVPB (0 g Intravenous Stopped 03/15/21 1140)      Note:  This document was prepared using Dragon voice recognition software and may include unintentional dictation errors.  Nanda Quinton, MD, [facility]    Kingjames Coury, [facility], MD 03/15/21 1414

## 2021-03-15 NOTE — ED Triage Notes (Signed)
Pt arrived POV from urgent care. Pt c/o nasal congestion, fever, chills, and dizziness that comes and goes. Pt had one episode of emesis earlier today. Pt states his at home covid test was negative on 03/14/21

## 2021-03-15 NOTE — ED Notes (Signed)
Attempted to ambulate the patient.  Patient sat on the side of the bed and started to say he felt dizzy.  Let the patient sit on the side of the bed for a few minutes.  Stood the patient up and patient began to walk towards the door.  Patient said he felt dizzy again and started to go to the left towards the wall.  Caught patient and assisted him back in the bed. Patient maintained on O2 saturation between 92-94% during this time.

## 2021-03-15 NOTE — Plan of Care (Signed)
  Problem: Education: Goal: Knowledge of General Education information will improve Description: Including pain rating scale, medication(s)/side effects and non-pharmacologic comfort measures 03/15/2021 2336 by Santa Lighter, RN Outcome: Progressing 03/15/2021 2335 by Santa Lighter, RN Outcome: Progressing   Problem: Clinical Measurements: Goal: Ability to maintain clinical measurements within normal limits will improve Outcome: Progressing

## 2021-03-15 NOTE — ED Notes (Signed)
Glucose was 142

## 2021-03-15 NOTE — H&P (Signed)
History and Physical    [name] [id number] DOB: 08-06-1950 DOA: 03/15/2021  PCP: Redmond School, MD   Patient coming from: home  I have personally briefly reviewed patient's old medical records in [place]  Chief Complaint: dizziness, SOB, chills, productive cough.  HPI: [name] is a 70 y.o. male with medical history significant of HTN, HLD, type 2 diabetes with nephropathy, CKD stage 3a, anxiety, tobacco abuse and depression; who presented to Ed with complaints for URI symptoms for the last 4 days and worsening. Patient reported on the day of admission his symptoms were associated with dizziness and fall at home (no major injury or head trauma reported. Patient had decrease oral intake, chills, runny nose and productive cough. No CP, no hemoptysis, no vomiting, no dysuria, no melena, no hematochezia.   COVID PCR neg, no vaccination.  ED Course: CXR demonstrating right perihilar space opacities; patient curb 65 score 2 (with elevation In his BUN and age > 36); CT head neg for acute intracranial abnormalities outside from mild sinusitis. Cultures taken IVF's given and antibiotics initiated. TRH contacted to place in observation for further evaluation and management of CAP.  Review of Systems: As per HPI otherwise all other systems reviewed and are negative.   Past Medical History:  Diagnosis Date   AAA (abdominal aortic aneurysm) ([place])    3.3 cm in 2012   ABDOMINAL AORTIC ANEURYSM 11/07/2009   Diameter of 3.3 cm by CT in 12/2010    Anxiety    ASCVD (arteriosclerotic cardiovascular disease)    LAD stent in 1999; RCA bare-metal stent in 12/2007, LAD stent patent, 50-60% proximal Cx lesion treated medically; 09/2012: CABG x3, LIMA to LAD, Sequential SVG to ramus intermediate and OM branch with EVH via right thigh   Coronary artery disease    Depression    Diabetes mellitus    DIABETES MELLITUS, TYPE II 08/09/2010   no insulin     ED (erectile dysfunction)     Gastric ulcer    Hepatic steatosis    Hyperlipidemia    HYPERLIPIDEMIA 08/09/2010   Lipid profile in 07/2010:144, 124, 34, 86; 01/2012:93, 69, 45, 43    Hypertension    Insomnia    MI (myocardial infarction) ([place])    Nephrolithiasis    NEPHROLITHIASIS, RECURRENT 08/09/2010   Obstruction of the ureter on CT scan of the abdomen resulting in right hydronephrosis in 12/2010; bladder outlet obstruction prior to that.  CT also revealed atherosclerotic changes in the abdominal arterial vessels, hepatic granuloma and calcification of the prostate.    S/P CABG x 3 10/07/2012   LIMA to LAD, Sequential SVG to ramus intermediate and OM branch with EVH via right thigh   THROMBOCYTOPENIA 08/09/2010   Platelets 88-114,000 over the past 4 years; prior evaluation by Dr. Tressie Stalker without a specific etiology identified.  01/2012: Normal CBC ex platelets-120    Thrombocytopenia ([place])    Chronic; evaluated by Dr. Tressie Stalker in 2007   Tobacco abuse    Past Surgical History:  Procedure Laterality Date   COLONOSCOPY N/A 01/19/2021   Procedure: COLONOSCOPY;  Surgeon: Rogene Houston, MD;  [facility];  Service: Endoscopy;  Laterality: N/A;  12:30   CORONARY ANGIOPLASTY WITH STENT PLACEMENT     1999, 2009   CORONARY ARTERY BYPASS GRAFT N/A 10/07/2012   Procedure: CORONARY ARTERY BYPASS GRAFTING (CABG);  Surgeon: Rexene Alberts, MD;  Location: [place];  Service: Open Heart Surgery;  Laterality: N/A;   CYSTOSCOPY  WITH LITHOLAPAXY N/A 10/29/2019   Procedure: CYSTOSCOPY WITH LITHOLAPAXY;  Surgeon: Irine Seal, MD;  [facility];  Service: Urology;  Laterality: N/A;   CYSTOSCOPY WITH RETROGRADE PYELOGRAM, URETEROSCOPY AND STENT PLACEMENT Left 08/30/2015   Procedure: CYSTOSCOPY LEFT RETROGRADE PYELOGRAM  LEFT   URETEROSCOPY,  LITHOLAPAXY;  Surgeon: Irine Seal, MD;  [facility];  Service: Urology;  Laterality: Left;   ESOPHAGOGASTRODUODENOSCOPY  04/04/2012   Procedure: ESOPHAGOGASTRODUODENOSCOPY (EGD);   Surgeon: Rogene Houston, MD;  [facility];  Service: Endoscopy;  Laterality: N/A;  830   ESOPHAGOGASTRODUODENOSCOPY  07/25/2012   Procedure: ESOPHAGOGASTRODUODENOSCOPY (EGD);  Surgeon: Rogene Houston, MD;  [facility];  Service: Endoscopy;  Laterality: N/A;  830   HOLMIUM LASER APPLICATION Left [id number]   Procedure: HOLMIUM LASER APPLICATION;  Surgeon: Irine Seal, MD;  [facility];  Service: Urology;  Laterality: Left;   INGUINAL HERNIA REPAIR     INTRAOPERATIVE TRANSESOPHAGEAL ECHOCARDIOGRAM N/A 10/07/2012   Procedure: INTRAOPERATIVE TRANSESOPHAGEAL ECHOCARDIOGRAM;  Surgeon: Rexene Alberts, MD;  Location: [place];  Service: Open Heart Surgery;  Laterality: N/A;   LEFT HEART CATHETERIZATION WITH CORONARY ANGIOGRAM N/A 10/03/2012   Procedure: LEFT HEART CATHETERIZATION WITH CORONARY ANGIOGRAM;  Surgeon: Minus Breeding, MD;  Location: [facility];  Service: Cardiovascular;  Laterality: N/A;   LITHOTRIPSY     POLYPECTOMY  01/19/2021   Procedure: POLYPECTOMY;  Surgeon: Rogene Houston, MD;  [facility];  Service: Endoscopy;;    Social History  reports that he has been smoking cigarettes. He has a 20.00 pack-year smoking history. He has never used smokeless tobacco. He reports that he does not drink alcohol and does not use drugs.  Allergies  Allergen Reactions   Meperidine Hcl Other (See Comments)    Was mixed with phenergan and turned blood vessels red.    Promethazine Hcl Other (See Comments)    Was mixed with the Demerol and caused veins to turn red.     Family History  Problem Relation Age of Onset   Diabetes Mother    Ovarian cancer Sister     Prior to Admission medications   Medication Sig Start Date End Date Taking? Authorizing Provider  acetaminophen (TYLENOL) 500 MG tablet Take 500 mg by mouth at bedtime.   Yes [provider]  aspirin EC 81 MG tablet Take 1 tablet (81 mg total) by mouth daily. 01/20/21  Yes Rehman, Mechele Dawley, MD   atorvastatin (LIPITOR) 40 MG tablet TAKE 1 TABLET BY MOUTH EVERY DAY Patient taking differently: Take 40 mg by mouth daily. 03/01/20  Yes BranchAlphonse Guild, MD  cetirizine (ZYRTEC) 10 MG tablet Take 10 mg by mouth daily.   Yes [provider]  diphenhydramine-acetaminophen (TYLENOL PM) 25-500 MG TABS tablet Take 1 tablet by mouth at bedtime.   Yes [provider]  metFORMIN (GLUCOPHAGE) 1000 MG tablet Take 1,000 mg by mouth 2 (two) times daily.   Yes [provider]  metoprolol tartrate (LOPRESSOR) 25 MG tablet TAKE 1 TABLET BY MOUTH TWICE A DAY Patient taking differently: Take 25 mg by mouth 2 (two) times daily. 02/23/21  Yes Branch, Alphonse Guild, MD  ramipril (ALTACE) 10 MG capsule TAKE 2 CAPSULES BY MOUTH EVERY DAY WITH BREAKFAST Patient taking differently: Take 20 mg by mouth daily. 03/14/21  Yes Branch, Alphonse Guild, MD  tamsulosin (FLOMAX) 0.4 MG CAPS capsule TAKE 1 CAPSULE BY MOUTH EVERY DAY Patient taking differently: Take 0.4 mg by mouth daily. 09/28/20  Yes Irine Seal, MD  [facility] ULTRA test strip USE TO TEST SUGAR THREE TIMES DAILY 12/21/20   [provider]    Physical Exam: Vitals:   03/15/21 1239 03/15/21 1600 03/15/21 1700 03/15/21 1723  BP: 119/68 (!) 151/73 137/67   Pulse: 76 86 71   Resp: 20 (!) 21 16   Temp:    98.9 F (37.2 C)  TempSrc:    Oral  SpO2: 96% 100% 94%   Weight:      Height:        Constitutional: NAD, calm, comfortable; expressed feeling dizzy when changing positions and been very tired. Vitals:   03/15/21 1239 03/15/21 1600 03/15/21 1700 03/15/21 1723  BP: 119/68 (!) 151/73 137/67   Pulse: 76 86 71   Resp: 20 (!) 21 16   Temp:    98.9 F (37.2 C)  TempSrc:    Oral  SpO2: 96% 100% 94%   Weight:      Height:       Eyes: PERRL, lids and conjunctivae normal, no icterus, no nystagmus. ENMT: Mucous membranes are moist. Posterior pharynx clear of any exudate or lesions. Neck: normal, supple, no masses, no  thyromegaly Respiratory: good air movement, positive rhonchi no wheezing currently. Cardiovascular: Regular rate and rhythm, no murmurs / rubs / gallops. No extremity edema. 2+ pedal pulses. No carotid bruits.  Abdomen: no tenderness, no masses palpated. No hepatosplenomegaly. Bowel sounds positive.  Musculoskeletal: no clubbing / cyanosis. No joint deformity upper and lower extremities. Good ROM, no contractures. Normal muscle tone.  Skin: no rashes, lesions, ulcers. No induration Neurologic: CN 2-12 grossly intact. Sensation intact, DTR normal. Strength 5/5 in all 4.  Psychiatric: Normal judgment and insight. Alert and oriented x 3. Normal mood.   Labs on Admission: I have personally reviewed following labs and imaging studies  CBC: Recent Labs  Lab 03/15/21 1010  WBC 10.9*  NEUTROABS 9.6*  HGB 14.7  HCT 46.3  MCV 92.4  PLT 71*    Basic Metabolic Panel: Recent Labs  Lab 03/15/21 1010  NA 136  K 4.0  CL 102  CO2 22  GLUCOSE 155*  BUN 24*  CREATININE 1.35*  CALCIUM 9.2    GFR: Estimated Creatinine Clearance: 56.6 mL/min (A) (by C-G formula based on SCr of 1.35 mg/dL (H)).  Liver Function Tests: Recent Labs  Lab 03/15/21 1010  AST 25  ALT 32  ALKPHOS 75  BILITOT 1.5*  PROT 7.6  ALBUMIN 4.2    Urine analysis:    Component Value Date/Time   COLORURINE AMBER (A) 03/15/2021 1244   APPEARANCEUR HAZY (A) 03/15/2021 1244   LABSPEC 1.027 03/15/2021 1244   PHURINE 5.0 03/15/2021 1244   GLUCOSEU NEGATIVE 03/15/2021 1244   HGBUR SMALL (A) 03/15/2021 1244   BILIRUBINUR NEGATIVE 03/15/2021 1244   BILIRUBINUR small 11/27/2019 1450   KETONESUR 5 (A) 03/15/2021 1244   PROTEINUR >=300 (A) 03/15/2021 1244   UROBILINOGEN negative (A) 11/27/2019 1450   UROBILINOGEN 0.2 10/04/2012 1449   NITRITE NEGATIVE 03/15/2021 1244   LEUKOCYTESUR NEGATIVE 03/15/2021 1244    Radiological Exams on Admission: CT HEAD WO CONTRAST (5MM)  Result Date: 03/15/2021 CLINICAL DATA:   Neuro deficit, acute, stroke suspected 3 days of dizziness EXAM: CT HEAD WITHOUT CONTRAST TECHNIQUE: Contiguous axial images were obtained from the base of the skull through the vertex without intravenous contrast. COMPARISON:  None. FINDINGS: Brain: No evidence of acute intracranial hemorrhage or extra-axial collection.No evidence of mass lesion/concern mass effect.The ventricles are  normal in size. Vascular: No hyperdense vessel or unexpected calcification. Skull: Normal. Negative for fracture or focal lesion. Sinuses/Orbits: Mild paranasal sinus mucosal thickening of the ethmoid air cells and right maxillary sinus. Other: None. IMPRESSION: No acute intracranial abnormality. Electronically Signed   By: Maurine Simmering M.D.   On: 03/15/2021 14:49   DG Chest Portable 1 View  Result Date: 03/15/2021 CLINICAL DATA:  Shortness of breath, chills, congestion EXAM: PORTABLE CHEST 1 VIEW COMPARISON:  11/10/2012 FINDINGS: Heart is normal size. Prior CABG. Right perihilar airspace opacity. No confluent opacity on the left. No effusions or acute bony abnormality. IMPRESSION: Right perihilar airspace opacity concerning for pneumonia. Followup PA and lateral chest X-ray is recommended in 3-4 weeks following trial of antibiotic therapy to ensure resolution and exclude underlying malignancy. Prior CABG. Electronically Signed   By: Rolm Baptise M.D.   On: 03/15/2021 10:05    EKG: Independently reviewed. No acute ischemic changes.  Assessment/Plan 1-CAP (community acquired pneumonia)  -given curb 55 of 2 and hx of DM, will place in observation telemetry bed and start treatment with IV antibiotics. -continue IVF, bronchodilators and supportive care -patient advised to quit smoking.  2-elevated troponin -no CP, reassuring EKG findings -patient denies CP -elevation in troponin due to PNA most likely  3-type 2 diabetes  -will hold oral hypoglycemic agents -follow CBG's and check A1C -SSI and levemir  started  4-HTN -continue home antihypertensive agents  5-HLD -continue lipitor  6-mild sinusitis  -start tx with claritin and flonase  7-HTN -continue tx with metoprolol  8-BPH -continue flomax -no urinary retention sx's reported.  9-tobacco abuse -Cessation counseling provided -nicotine patch ordered.   DVT prophylaxis: Lovenox.  Code Status:   Full Code. Family Communication:  No family at bedside. Disposition Plan:   Patient is from:  home  Anticipated DC to:  home  Anticipated DC date:  03/16/21  Anticipated DC barriers: Stabilization of PNA and asssess of clinical response to abx's.  Consults called:  None  Admission status:  Observation, telemetry, LOS < 2 midnights.  Severity of Illness: The appropriate patient status for this patient is OBSERVATION. Observation status is judged to be reasonable and necessary in order to provide the required intensity of service to ensure the patient's safety. The patient's presenting symptoms, physical exam findings, and initial radiographic and laboratory data in the context of their medical condition is felt to place them at decreased risk for further clinical deterioration. Furthermore, it is anticipated that the patient will be medically stable for discharge from the hospital within 2 midnights of admission. The following factors support the patient status of observation.   " The patient's presenting symptoms include URI symptoms. " The physical exam findings include productive coughing spells, tachypnea, with exertion.. " The initial radiographic and laboratory data are positive right perihilar PNA on CXR, mildly elevated WBC's.    Barton Dubois MD [facility]  How to contact the [facility] Attending or Consulting provider [place] or covering provider during after hours [place], for this patient?   Check the care team in [facility] and look for a) attending/consulting TRH provider listed and b) the [facility] team listed Log into  [email or web address] and use [place]'s universal password to access. If you do not have the password, please contact the hospital operator. Locate the [facility] provider you are looking for under [facility] and page to a number that you can be directly reached. If you still have difficulty reaching the provider, please page the  DOC (Director on Call) for the Hospitalists listed on amion for assistance.  03/15/2021, 5:39 PM

## 2021-03-15 NOTE — Plan of Care (Signed)

## 2021-03-15 NOTE — ED Notes (Signed)
Took pt to the restroom. He did fine standing and walking on his own. He did great standing in the restroom by himself  to urinate. And did just as well walking back to the bed to sit and eat his dinner.

## 2021-03-15 NOTE — ED Triage Notes (Signed)
Chills, nasal congestion and runny nose since Sunday.  Feels dizzy since yesterday.  Patient vomited during triage.  States he was dizzy and feel off the edge of his bathtub last night.

## 2021-03-15 NOTE — ED Provider Notes (Signed)
[name]   [id number] 03/15/21 Arrival Time: 0803  ASSESSMENT & PLAN:  1. Lightheadedness   2. Non-intractable vomiting, presence of nausea not specified, unspecified vomiting type   3. Unsteady gait    VS stable here but he just doesn't look well. Very slow to answer questsions. No CP. ECG without acute changes compared to one dated 03/02/21. No focal deficits on neuro exam but he is not very compliant; does see to word search at times. Decreased PO intake for sev days; reports emesis yest and today. Limited ability to work this up here; discussed. Cannot r/o electrolyte disturbance and/or sepsis. Given how he looks will refer to ED for further evaluation.  Labs Reviewed  POCT FASTING CBG [facility] MANUAL ENTRY - Abnormal; Notable for the following components:      Result Value   POCT Glucose ([facility]) 142 (*)    All other components within normal limits   Wife to drive him. Stable upon discharge.   Follow-up Information     Go to  [facility].   Specialty: Emergency Medicine Contact information: [address] [id number] [address] [phone]                Reviewed expectations re: course of current medical issues. Questions answered. Outlined signs and symptoms indicating need for more acute intervention. Understanding verbalized. After Visit Summary given.   SUBJECTIVE: History from: patient. Poor historian. [name] is a 70 y.o. male who reports lightheadedness, fatigue, chills, subj fever; over past few days. No HA or visual changes. Mild cough without CP or SOB. Decreased PO intake with reported n/v yesterday and today; vomited in triage here. Home COVID test negative. Tylenol this am; ques mild help. Drove himself here. Ambulatory here reportedly feels off balance.   OBJECTIVE:  Vitals:   03/15/21 0814  BP: (!) 141/78  Pulse: 73  Resp: 18  Temp: 99.9 F (37.7 C)  TempSrc: Oral  SpO2: 95%     General appearance: alert; no distress; appears fatigued Eyes: PERRLA; EOMI; conjunctiva normal HENT: [place]; AT; with nasal congestion Neck: supple  CV: regular with occas PVC Lungs: speaks full sentences without difficulty; unlabored Extremities: no edema Skin: warm and dry Neurologic: very unsteady gate, CN 2-12 grossly intact; seems to word search when trying to answer questions Psychological: alert and cooperative; normal mood and affect  Labs: Results for orders placed or performed during the hospital encounter of 03/15/21  POCT CBG (manual entry)  Result Value Ref Range   POCT Glucose ([facility]) 142 (A) 70 - 99 mg/dL   Labs Reviewed  POCT FASTING CBG [facility] MANUAL ENTRY - Abnormal; Notable for the following components:      Result Value   POCT Glucose ([facility]) 142 (*)    All other components within normal limits    Imaging: DG Chest Portable 1 View  Result Date: 03/15/2021 CLINICAL DATA:  Shortness of breath, chills, congestion EXAM: PORTABLE CHEST 1 VIEW COMPARISON:  11/10/2012 FINDINGS: Heart is normal size. Prior CABG. Right perihilar airspace opacity. No confluent opacity on the left. No effusions or acute bony abnormality. IMPRESSION: Right perihilar airspace opacity concerning for pneumonia. Followup PA and lateral chest X-ray is recommended in 3-4 weeks following trial of antibiotic therapy to ensure resolution and exclude underlying malignancy. Prior CABG. Electronically Signed   By: Rolm Baptise M.D.   On: 03/15/2021 10:05    Allergies  Allergen Reactions   Meperidine Hcl Other (See Comments)  Was mixed with phenergan and turned blood vessels red.    Promethazine Hcl Other (See Comments)    Was mixed with the Demerol and caused veins to turn red.     Past Medical History:  Diagnosis Date   AAA (abdominal aortic aneurysm) ([place])    3.3 cm in 2012   ABDOMINAL AORTIC ANEURYSM 11/07/2009   Diameter of 3.3 cm by CT in 12/2010    Anxiety    ASCVD (arteriosclerotic  cardiovascular disease)    LAD stent in 1999; RCA bare-metal stent in 12/2007, LAD stent patent, 50-60% proximal Cx lesion treated medically; 09/2012: CABG x3, LIMA to LAD, Sequential SVG to ramus intermediate and OM branch with EVH via right thigh   Coronary artery disease    Depression    Diabetes mellitus    DIABETES MELLITUS, TYPE II 08/09/2010   no insulin     ED (erectile dysfunction)    Gastric ulcer    Hepatic steatosis    Hyperlipidemia    HYPERLIPIDEMIA 08/09/2010   Lipid profile in 07/2010:144, 124, 34, 86; 01/2012:93, 70, 4, 43    Hypertension    Insomnia    MI (myocardial infarction) ([place])    Nephrolithiasis    NEPHROLITHIASIS, RECURRENT 08/09/2010   Obstruction of the ureter on CT scan of the abdomen resulting in right hydronephrosis in 12/2010; bladder outlet obstruction prior to that.  CT also revealed atherosclerotic changes in the abdominal arterial vessels, hepatic granuloma and calcification of the prostate.    S/P CABG x 3 10/07/2012   LIMA to LAD, Sequential SVG to ramus intermediate and OM branch with EVH via right thigh   THROMBOCYTOPENIA 08/09/2010   Platelets 88-114,000 over the past 4 years; prior evaluation by Dr. Tressie Stalker without a specific etiology identified.  01/2012: Normal CBC ex platelets-120    Thrombocytopenia ([facility])    Chronic; evaluated by Dr. Tressie Stalker in 2007   Tobacco abuse    Social History   Socioeconomic History   Marital status: Married    Spouse name: Not on file   Number of children: Not on file   Years of education: Not on file   Highest education level: Not on file  Occupational History   Not on file  Tobacco Use   Smoking status: Every Day    Packs/day: 0.50    Years: 40.00    Pack years: 20.00    Types: Cigarettes    Last attempt to quit: 07/09/2013    Years since quitting: 7.6   Smokeless tobacco: Never   Tobacco comments:    6-7 cigs. daily   Vaping Use   Vaping Use: Never used  Substance and Sexual Activity   Alcohol  use: No    Alcohol/week: 0.0 standard drinks   Drug use: No   Sexual activity: Not on file  Other Topics Concern   Not on file  Social History Narrative   No regular exercise   Social Determinants of Health   Financial Resource Strain: Not on file  Food Insecurity: Not on file  Transportation Needs: Not on file  Physical Activity: Not on file  Stress: Not on file  Social Connections: Not on file  Intimate Partner Violence: Not on file   Family History  Problem Relation Age of Onset   Diabetes Mother    Ovarian cancer Sister    Past Surgical History:  Procedure Laterality Date   COLONOSCOPY N/A 01/19/2021   Procedure: COLONOSCOPY;  Surgeon: Rogene Houston, MD;  [facility];  Service: Endoscopy;  Laterality: N/A;  12:30   CORONARY ANGIOPLASTY WITH STENT PLACEMENT     1999, 2009   CORONARY ARTERY BYPASS GRAFT N/A 10/07/2012   Procedure: CORONARY ARTERY BYPASS GRAFTING (CABG);  Surgeon: Rexene Alberts, MD;  Location: [place];  Service: Open Heart Surgery;  Laterality: N/A;   CYSTOSCOPY WITH LITHOLAPAXY N/A 10/29/2019   Procedure: CYSTOSCOPY WITH LITHOLAPAXY;  Surgeon: Irine Seal, MD;  [facility];  Service: Urology;  Laterality: N/A;   CYSTOSCOPY WITH RETROGRADE PYELOGRAM, URETEROSCOPY AND STENT PLACEMENT Left 08/30/2015   Procedure: CYSTOSCOPY LEFT RETROGRADE PYELOGRAM  LEFT   URETEROSCOPY,  LITHOLAPAXY;  Surgeon: Irine Seal, MD;  [facility];  Service: Urology;  Laterality: Left;   ESOPHAGOGASTRODUODENOSCOPY  04/04/2012   Procedure: ESOPHAGOGASTRODUODENOSCOPY (EGD);  Surgeon: Rogene Houston, MD;  [facility];  Service: Endoscopy;  Laterality: N/A;  830   ESOPHAGOGASTRODUODENOSCOPY  07/25/2012   Procedure: ESOPHAGOGASTRODUODENOSCOPY (EGD);  Surgeon: Rogene Houston, MD;  [facility];  Service: Endoscopy;  Laterality: N/A;  830   HOLMIUM LASER APPLICATION Left [id number]   Procedure: HOLMIUM LASER APPLICATION;  Surgeon: Irine Seal, MD;   [facility];  Service: Urology;  Laterality: Left;   INGUINAL HERNIA REPAIR     INTRAOPERATIVE TRANSESOPHAGEAL ECHOCARDIOGRAM N/A 10/07/2012   Procedure: INTRAOPERATIVE TRANSESOPHAGEAL ECHOCARDIOGRAM;  Surgeon: Rexene Alberts, MD;  Location: [place];  Service: Open Heart Surgery;  Laterality: N/A;   LEFT HEART CATHETERIZATION WITH CORONARY ANGIOGRAM N/A 10/03/2012   Procedure: LEFT HEART CATHETERIZATION WITH CORONARY ANGIOGRAM;  Surgeon: Minus Breeding, MD;  Location: [facility] CATH LAB;  Service: Cardiovascular;  Laterality: N/A;   LITHOTRIPSY     POLYPECTOMY  01/19/2021   Procedure: POLYPECTOMY;  Surgeon: Rogene Houston, MD;  [facility];  Service: Endoscopy;;     Vanessa Kick, MD 03/15/21 1443

## 2021-03-15 NOTE — Progress Notes (Signed)
   03/15/21 2314  Assess: MEWS Score  Temp (!) 102.8 F (39.3 C)  BP 139/69  Pulse Rate 80  Resp 20  SpO2 98 %  O2 Device Room Air  Assess: MEWS Score  MEWS Temp 2  MEWS Systolic 0  MEWS Pulse 0  MEWS RR 0  MEWS LOC 0  MEWS Score 2  MEWS Score Color Yellow  Assess: if the MEWS score is Yellow or Red  Were vital signs taken at a resting state? Yes  Focused Assessment Change from prior assessment (see assessment flowsheet)  Early Detection of Sepsis Score *See Row Information* High  MEWS guidelines implemented *See Row Information* Yes  Treat  MEWS Interventions Administered prn meds/treatments  Pain Scale 0-10  Pain Score 0  Take Vital Signs  Increase Vital Sign Frequency  Yellow: Q 2hr X 2 then Q 4hr X 2, if remains yellow, continue Q 4hrs  Escalate  MEWS: Escalate Yellow: discuss with charge nurse/RN and consider discussing with provider and RRT  Notify: Charge Nurse/RN  Name of Charge Nurse/RN Notified Zyeir Dymek  Date Charge Nurse/RN Notified 03/15/21  Time Charge Nurse/RN Notified 2338  Notify: Provider  Provider Name/Title Zeirle-Gosh  Date Provider Notified 03/15/21  Time Provider Notified 2338  Notification Type Page  Notification Reason Change in status;Critical result

## 2021-03-15 NOTE — ED Notes (Signed)
Patient's wife call to transport patient to the ED.  Agricultural consultant at [employer].

## 2021-03-16 ENCOUNTER — Ambulatory Visit (HOSPITAL_COMMUNITY): Admission: RE | Admit: 2021-03-16 | Payer: Medicare HMO | Source: Ambulatory Visit

## 2021-03-16 DIAGNOSIS — Z888 Allergy status to other drugs, medicaments and biological substances status: Secondary | ICD-10-CM | POA: Diagnosis not present

## 2021-03-16 DIAGNOSIS — Z833 Family history of diabetes mellitus: Secondary | ICD-10-CM | POA: Diagnosis not present

## 2021-03-16 DIAGNOSIS — W19XXXA Unspecified fall, initial encounter: Secondary | ICD-10-CM | POA: Diagnosis not present

## 2021-03-16 DIAGNOSIS — N1831 Chronic kidney disease, stage 3a: Secondary | ICD-10-CM | POA: Diagnosis not present

## 2021-03-16 DIAGNOSIS — E1169 Type 2 diabetes mellitus with other specified complication: Secondary | ICD-10-CM | POA: Diagnosis not present

## 2021-03-16 DIAGNOSIS — F419 Anxiety disorder, unspecified: Secondary | ICD-10-CM | POA: Diagnosis present

## 2021-03-16 DIAGNOSIS — J189 Pneumonia, unspecified organism: Secondary | ICD-10-CM | POA: Diagnosis not present

## 2021-03-16 DIAGNOSIS — Z7982 Long term (current) use of aspirin: Secondary | ICD-10-CM | POA: Diagnosis not present

## 2021-03-16 DIAGNOSIS — R778 Other specified abnormalities of plasma proteins: Secondary | ICD-10-CM | POA: Diagnosis present

## 2021-03-16 DIAGNOSIS — Z955 Presence of coronary angioplasty implant and graft: Secondary | ICD-10-CM | POA: Diagnosis not present

## 2021-03-16 DIAGNOSIS — F32A Depression, unspecified: Secondary | ICD-10-CM | POA: Diagnosis present

## 2021-03-16 DIAGNOSIS — Z951 Presence of aortocoronary bypass graft: Secondary | ICD-10-CM | POA: Diagnosis not present

## 2021-03-16 DIAGNOSIS — Z79899 Other long term (current) drug therapy: Secondary | ICD-10-CM | POA: Diagnosis not present

## 2021-03-16 DIAGNOSIS — G4700 Insomnia, unspecified: Secondary | ICD-10-CM | POA: Diagnosis present

## 2021-03-16 DIAGNOSIS — Z20822 Contact with and (suspected) exposure to covid-19: Secondary | ICD-10-CM | POA: Diagnosis not present

## 2021-03-16 DIAGNOSIS — N400 Enlarged prostate without lower urinary tract symptoms: Secondary | ICD-10-CM | POA: Diagnosis not present

## 2021-03-16 DIAGNOSIS — I10 Essential (primary) hypertension: Secondary | ICD-10-CM | POA: Diagnosis not present

## 2021-03-16 DIAGNOSIS — I129 Hypertensive chronic kidney disease with stage 1 through stage 4 chronic kidney disease, or unspecified chronic kidney disease: Secondary | ICD-10-CM | POA: Diagnosis not present

## 2021-03-16 DIAGNOSIS — E1122 Type 2 diabetes mellitus with diabetic chronic kidney disease: Secondary | ICD-10-CM | POA: Diagnosis not present

## 2021-03-16 DIAGNOSIS — Y92009 Unspecified place in unspecified non-institutional (private) residence as the place of occurrence of the external cause: Secondary | ICD-10-CM | POA: Diagnosis not present

## 2021-03-16 DIAGNOSIS — R531 Weakness: Secondary | ICD-10-CM | POA: Diagnosis not present

## 2021-03-16 DIAGNOSIS — I2510 Atherosclerotic heart disease of native coronary artery without angina pectoris: Secondary | ICD-10-CM | POA: Diagnosis present

## 2021-03-16 DIAGNOSIS — R69 Illness, unspecified: Secondary | ICD-10-CM | POA: Diagnosis not present

## 2021-03-16 DIAGNOSIS — E785 Hyperlipidemia, unspecified: Secondary | ICD-10-CM | POA: Diagnosis not present

## 2021-03-16 DIAGNOSIS — F17210 Nicotine dependence, cigarettes, uncomplicated: Secondary | ICD-10-CM | POA: Diagnosis present

## 2021-03-16 DIAGNOSIS — Z7984 Long term (current) use of oral hypoglycemic drugs: Secondary | ICD-10-CM | POA: Diagnosis not present

## 2021-03-16 DIAGNOSIS — I252 Old myocardial infarction: Secondary | ICD-10-CM | POA: Diagnosis not present

## 2021-03-16 LAB — GLUCOSE, CAPILLARY
Glucose-Capillary: 135 mg/dL — ABNORMAL HIGH (ref 70–99)
Glucose-Capillary: 105 mg/dL — ABNORMAL HIGH (ref 70–99)
Glucose-Capillary: 119 mg/dL — ABNORMAL HIGH (ref 70–99)
Glucose-Capillary: 158 mg/dL — ABNORMAL HIGH (ref 70–99)

## 2021-03-16 LAB — CBC
HCT: 40.9 % (ref 39.0–52.0)
Hemoglobin: 13.2 g/dL (ref 13.0–17.0)
MCH: 30.3 pg (ref 26.0–34.0)
MCHC: 32.3 g/dL (ref 30.0–36.0)
MCV: 93.8 fL (ref 80.0–100.0)
Platelets: 60 10*3/uL — ABNORMAL LOW (ref 150–400)
RBC: 4.36 MIL/uL (ref 4.22–5.81)
RDW: 13.9 % (ref 11.5–15.5)
WBC: 8 10*3/uL (ref 4.0–10.5)
nRBC: 0 % (ref 0.0–0.2)

## 2021-03-16 LAB — BASIC METABOLIC PANEL
Anion gap: 8 (ref 5–15)
BUN: 23 mg/dL (ref 8–23)
CO2: 26 mmol/L (ref 22–32)
Calcium: 8.4 mg/dL — ABNORMAL LOW (ref 8.9–10.3)
Chloride: 103 mmol/L (ref 98–111)
Creatinine, Ser: 1.12 mg/dL (ref 0.61–1.24)
GFR, Estimated: >60 mL/min (ref >60)
Glucose, Bld: 116 mg/dL — ABNORMAL HIGH (ref 70–99)
Potassium: 3.9 mmol/L (ref 3.5–5.1)
Sodium: 137 mmol/L (ref 135–145)

## 2021-03-16 MED ORDER — SACCHAROMYCES BOULARDII 250 MG PO CAPS
250.0000 mg | ORAL_CAPSULE | Freq: Two times a day (BID) | ORAL | Status: DC
  Administered 2021-03-16 – 2021-03-18 (×5): 250 mg via ORAL
  Filled 2021-03-16 (×5): qty 1

## 2021-03-16 NOTE — Progress Notes (Signed)
PROGRESS NOTE    [name]  [id number] DOB: 1951/05/07 DOA: 03/15/2021 PCP: Redmond School, MD   Chief Complaint  Patient presents with   Fever    Brief admission narrative:  [name] is a 70 y.o. male with medical history significant of HTN, HLD, type 2 diabetes with nephropathy, CKD stage 3a, anxiety, tobacco abuse and depression; who presented to Ed with complaints for URI symptoms for the last 4 days and worsening. Patient reported on the day of admission his symptoms were associated with dizziness and fall at home (no major injury or head trauma reported. Patient had decrease oral intake, chills, runny nose and productive cough. No CP, no hemoptysis, no vomiting, no dysuria, no melena, no hematochezia.    COVID PCR neg, no vaccination.   ED Course: CXR demonstrating right perihilar space opacities; patient curb to -Patient with history of diabetes 2 (with elevation In his BUN and age > 36); CT head neg for acute intracranial abnormalities outside from mild sinusitis. Cultures taken IVF's given and antibiotics initiated. TRH contacted to place in observation for further evaluation and management of CAP.  Assessment & Plan: 1-community acquired pneumonia -Affecting right perihilar area -Presumed to be bacterial in origin -Still spiking fever with ongoing productive coughing spells -curb 65 score 2 -Continue IV Rocephin and Zithromax -Follow culture data -Continue as needed antipyretics and bronchodilators -Maintain adequate hydration and follow clinical response.  2-elevated troponin -Appears to be in the setting of acute pulmonary process -Patient denies chest pain -No acute ischemic changes on telemetry.  3-type 2 diabetes mellitus -A1c 5.8 -Continue holding oral hypoglycemic agents while inpatient -Continue sliding scale insulin.  4-hypertension -Continue home antihypertensive agents.  5-hyperlipidemia -Continue Lipitor.  6-BPH -Continue  Flomax--no urinary retention symptoms reported.  7-tobacco abuse -Cessation counseling provided -Continue the use of nicotine patch.  8-Sinusitis -Continue the use of Claritin and Flonase   DVT prophylaxis: Lovenox Code Status: Full code Family Communication: No family at bedside. Disposition:   Status is: Inpatient  Remains inpatient appropriate because:IV treatments appropriate due to intensity of illness or inability to take PO  Dispo: The patient is from: Home              Anticipated d/c is to: Home              Patient currently is not medically stable to d/c.   Difficult to place patient No       Consultants:  See below for x-ray reports  Procedures:  None  Antimicrobials:  Continue Rocephin and Zithromax.   Subjective: Still spiking fever (temperature to 102.8), no chest pain, no nausea vomiting.  Expressed no feeling lightheaded today but is still having intermittent coughing spells.  Objective: Vitals:   03/16/21 0314 03/16/21 0613 03/16/21 0834 03/16/21 1335  BP: 130/68 (!) 149/70 140/71 (!) 149/73  Pulse: 65 83 72 85  Resp: 18 18  (!) 22  Temp: 98.5 F (36.9 C) 100.2 F (37.9 C)  (!) 102.9 F (39.4 C)  TempSrc: Oral Oral  Oral  SpO2: 98% 98%  97%  Weight:      Height:        Intake/Output Summary (Last 24 hours) at 03/16/2021 1349 Last data filed at 03/16/2021 1323 Gross per 24 hour  Intake 1009.01 ml  Output --  Net 1009.01 ml   Filed Weights   03/15/21 0927  Weight: 76.6 kg    Examination:  General exam: Continued spiking fever; reports intermittent productive coughing  spells and general malaise.  No nausea, no vomiting, no chest pain, no abdominal pain. Respiratory system: No wheezing, no using accessory muscles; positive rhonchi bilaterally. Cardiovascular system: S1 and S2, no rubs, no gallops, no JVD, no lower extremity edema. Gastrointestinal system: Abdomen is nondistended, soft and nontender. No organomegaly or masses felt.  Normal bowel sounds heard. Central nervous system: Alert and oriented. No focal neurological deficits. Extremities: No cyanosis or clubbing Skin: No rashes, no petechiae. Psychiatry: Judgement and insight appear normal. Mood & affect appropriate.     Data Reviewed: I have personally reviewed following labs and imaging studies  CBC: Recent Labs  Lab 03/15/21 1010 03/16/21 0523  WBC 10.9* 8.0  NEUTROABS 9.6*  --   HGB 14.7 13.2  HCT 46.3 40.9  MCV 92.4 93.8  PLT 71* 60*    Basic Metabolic Panel: Recent Labs  Lab 03/15/21 1010 03/16/21 0523  NA 136 137  K 4.0 3.9  CL 102 103  CO2 22 26  GLUCOSE 155* 116*  BUN 24* 23  CREATININE 1.35* 1.12  CALCIUM 9.2 8.4*    GFR: Estimated Creatinine Clearance: 68.2 mL/min (by C-G formula based on SCr of 1.12 mg/dL).  Liver Function Tests: Recent Labs  Lab 03/15/21 1010  AST 25  ALT 32  ALKPHOS 75  BILITOT 1.5*  PROT 7.6  ALBUMIN 4.2    CBG: Recent Labs  Lab 03/15/21 2154 03/15/21 2225 03/16/21 0756 03/16/21 1123  GLUCAP 140* 142* 135* 105*    Recent Results (from the past 240 hour(s))  Resp Panel by RT-PCR (Flu A&B, Covid) Nasopharyngeal Swab     Status: None   Collection Time: 03/15/21 10:10 AM   Specimen: Nasopharyngeal Swab; Nasopharyngeal(NP) swabs in vial transport medium  Result Value Ref Range Status   SARS Coronavirus 2 by RT PCR NEGATIVE NEGATIVE Final    Comment: (NOTE) SARS-CoV-2 target nucleic acids are NOT DETECTED.  The SARS-CoV-2 RNA is generally detectable in upper respiratory specimens during the acute phase of infection. The lowest concentration of SARS-CoV-2 viral copies this assay can detect is 138 copies/mL. A negative result does not preclude SARS-Cov-2 infection and should not be used as the sole basis for treatment or other patient management decisions. A negative result may occur with  improper specimen collection/handling, submission of specimen other than nasopharyngeal swab,  presence of viral mutation(s) within the areas targeted by this assay, and inadequate number of viral copies(<138 copies/mL). A negative result must be combined with clinical observations, patient history, and epidemiological information. The expected result is Negative.  Fact Sheet for Patients:  EntrepreneurPulse.com.au  Fact Sheet for Healthcare Providers:  IncredibleEmployment.be  This test is no t yet approved or cleared by the [place] FDA and  has been authorized for detection and/or diagnosis of SARS-CoV-2 by FDA under an Emergency Use Authorization (EUA). This EUA will remain  in effect (meaning this test can be used) for the duration of the COVID-19 declaration under Section 564(b)(1) of the Act, 21 U.S.C.section 360bbb-3(b)(1), unless the authorization is terminated  or revoked sooner.       Influenza A by PCR NEGATIVE NEGATIVE Final   Influenza B by PCR NEGATIVE NEGATIVE Final    Comment: (NOTE) The Xpert Xpress SARS-CoV-2/FLU/RSV plus assay is intended as an aid in the diagnosis of influenza from Nasopharyngeal swab specimens and should not be used as a sole basis for treatment. Nasal washings and aspirates are unacceptable for Xpert Xpress SARS-CoV-2/FLU/RSV testing.  Fact Sheet for Patients: EntrepreneurPulse.com.au  Fact Sheet for Healthcare Providers: IncredibleEmployment.be  This test is not yet approved or cleared by the [place] FDA and has been authorized for detection and/or diagnosis of SARS-CoV-2 by FDA under an Emergency Use Authorization (EUA). This EUA will remain in effect (meaning this test can be used) for the duration of the COVID-19 declaration under Section 564(b)(1) of the Act, 21 U.S.C. section 360bbb-3(b)(1), unless the authorization is terminated or revoked.  Performed at [facility], [address], [place], [place] [address]   Culture, blood (routine x  2)     Status: None (Preliminary result)   Collection Time: 03/15/21 10:50 AM   Specimen: BLOOD LEFT FOREARM  Result Value Ref Range Status   Specimen Description BLOOD LEFT FOREARM  Final   Special Requests   Final    BOTTLES DRAWN AEROBIC AND ANAEROBIC Blood Culture adequate volume   Culture   Final    NO GROWTH < 24 HOURS Performed at [facility], [address], [place], [place] [address]    Report Status PENDING  Incomplete  Culture, blood (routine x 2)     Status: None (Preliminary result)   Collection Time: 03/15/21 10:50 AM   Specimen: Left Antecubital; Blood  Result Value Ref Range Status   Specimen Description LEFT ANTECUBITAL  Final   Special Requests   Final    BOTTLES DRAWN AEROBIC AND ANAEROBIC Blood Culture adequate volume   Culture   Final    NO GROWTH < 24 HOURS Performed at [facility], [address] [place], [place] [address]    Report Status PENDING  Incomplete     Radiology Studies: CT HEAD WO CONTRAST (5MM)  Result Date: 03/15/2021 CLINICAL DATA:  Neuro deficit, acute, stroke suspected 3 days of dizziness EXAM: CT HEAD WITHOUT CONTRAST TECHNIQUE: Contiguous axial images were obtained from the base of the skull through the vertex without intravenous contrast. COMPARISON:  None. FINDINGS: Brain: No evidence of acute intracranial hemorrhage or extra-axial collection.No evidence of mass lesion/concern mass effect.The ventricles are normal in size. Vascular: No hyperdense vessel or unexpected calcification. Skull: Normal. Negative for fracture or focal lesion. Sinuses/Orbits: Mild paranasal sinus mucosal thickening of the ethmoid air cells and right maxillary sinus. Other: None. IMPRESSION: No acute intracranial abnormality. Electronically Signed   By: Maurine Simmering M.D.   On: 03/15/2021 14:49   DG Chest Portable 1 View  Result Date: 03/15/2021 CLINICAL DATA:  Shortness of breath, chills, congestion EXAM: PORTABLE CHEST 1 VIEW COMPARISON:  11/10/2012 FINDINGS: Heart  is normal size. Prior CABG. Right perihilar airspace opacity. No confluent opacity on the left. No effusions or acute bony abnormality. IMPRESSION: Right perihilar airspace opacity concerning for pneumonia. Followup PA and lateral chest X-ray is recommended in 3-4 weeks following trial of antibiotic therapy to ensure resolution and exclude underlying malignancy. Prior CABG. Electronically Signed   By: Rolm Baptise M.D.   On: 03/15/2021 10:05     Scheduled Meds:  aspirin EC  81 mg Oral Daily   atorvastatin  40 mg Oral Daily   azithromycin  500 mg Oral Daily   enoxaparin (LOVENOX) injection  40 mg Subcutaneous Q24H   fluticasone  1 spray Each Nare Daily   insulin aspart  0-15 Units Subcutaneous TID WC   insulin aspart  0-5 Units Subcutaneous QHS   loratadine  10 mg Oral Daily   metoprolol tartrate  25 mg Oral BID   nicotine  14 mg Transdermal Daily   saccharomyces boulardii  250 mg Oral  BID   tamsulosin  0.4 mg Oral Daily   Continuous Infusions:  cefTRIAXone (ROCEPHIN)  IV 2 g (03/16/21 1140)   lactated ringers Stopped (03/15/21 1442)     LOS: 0 days    Time spent: 35 minutes   Barton Dubois, MD [facility]   To contact the attending provider between 7A-7P or the covering provider during after hours 7P-7A, please log into the web site [email or web address] and access using universal [place] password for that web site. If you do not have the password, please call the hospital operator.  03/16/2021, 1:49 PM

## 2021-03-16 NOTE — Progress Notes (Signed)
   03/16/21 [id number]  Assess: MEWS Score  Temp 100.2 F (37.9 C)  BP (!) 149/70  Pulse Rate 83  Resp 18  SpO2 98 %  O2 Device Room Air  Assess: MEWS Score  MEWS Temp 0  MEWS Systolic 0  MEWS Pulse 0  MEWS RR 0  MEWS LOC 0  MEWS Score 0  MEWS Score Color Green  Document  Patient Outcome Stabilized after interventions  Progress note created (see row info) Yes

## 2021-03-16 NOTE — Progress Notes (Signed)
   03/16/21 1335  Assess: MEWS Score  Temp (!) 102.9 F (39.4 C)  BP (!) 149/73  Pulse Rate 85  Resp (!) 22  SpO2 97 %  O2 Device Room Air  Assess: MEWS Score  MEWS Temp 2  MEWS Systolic 0  MEWS Pulse 0  MEWS RR 1  MEWS LOC 0  MEWS Score 3  MEWS Score Color Yellow  Assess: if the MEWS score is Yellow or Red  Were vital signs taken at a resting state? Yes  Focused Assessment No change from prior assessment  Early Detection of Sepsis Score *See Row Information* Low  MEWS guidelines implemented *See Row Information* Yes  Treat  MEWS Interventions Administered prn meds/treatments  Pain Scale 0-10  Pain Score 0  Take Vital Signs  Increase Vital Sign Frequency  Yellow: Q 2hr X 2 then Q 4hr X 2, if remains yellow, continue Q 4hrs  Notify: Charge Nurse/RN  Name of Charge Nurse/RN Notified Lowry Ram, RN  Date Charge Nurse/RN Notified 03/16/21  Time Charge Nurse/RN Notified 1343  Notify: Provider  Provider Name/Title Dr. Dyann Kief  Date Provider Notified 03/16/21  Time Provider Notified 1343  Notification Type Page  Notification Reason Other (Comment) (will not discharge today as planned due to high temp.; florastor added)  Provider response See new orders  Date of Provider Response 03/16/21  Time of Provider Response 1353  Document  Patient Outcome Stabilized after interventions

## 2021-03-17 LAB — GLUCOSE, CAPILLARY
Glucose-Capillary: 136 mg/dL — ABNORMAL HIGH (ref 70–99)
Glucose-Capillary: 149 mg/dL — ABNORMAL HIGH (ref 70–99)
Glucose-Capillary: 111 mg/dL — ABNORMAL HIGH (ref 70–99)
Glucose-Capillary: 122 mg/dL — ABNORMAL HIGH (ref 70–99)

## 2021-03-17 NOTE — Care Management Important Message (Signed)
Important Message  Patient Details  Name: [name] MRN: [id number] Date of Birth: 05-01-1951   Medicare Important Message Given:  Yes     [name] 03/17/2021, 2:08 PM

## 2021-03-17 NOTE — Plan of Care (Signed)

## 2021-03-17 NOTE — Progress Notes (Signed)
PROGRESS NOTE    [name]  [id number] DOB: 1950-09-18 DOA: 03/15/2021 PCP: Redmond School, MD   Chief Complaint  Patient presents with   Fever    Brief admission narrative:  [name] is a 70 y.o. male with medical history significant of HTN, HLD, type 2 diabetes with nephropathy, CKD stage 3a, anxiety, tobacco abuse and depression; who presented to Ed with complaints for URI symptoms for the last 4 days and worsening. Patient reported on the day of admission his symptoms were associated with dizziness and fall at home (no major injury or head trauma reported. Patient had decrease oral intake, chills, runny nose and productive cough. No CP, no hemoptysis, no vomiting, no dysuria, no melena, no hematochezia.    COVID PCR neg, no vaccination.   ED Course: CXR demonstrating right perihilar space opacities; patient curb to -Patient with history of diabetes 2 (with elevation In his BUN and age > 23); CT head neg for acute intracranial abnormalities outside from mild sinusitis. Cultures taken IVF's given and antibiotics initiated. TRH contacted to place in observation for further evaluation and management of CAP.  Assessment & Plan: 1-community acquired pneumonia -Affecting right perihilar area -Presumed to be bacterial in origin -Still spiking fever with ongoing productive coughing spells -curb 65 score 2 (age and elevated BUN on admission) -Continue IV Rocephin and Zithromax -Follow culture data -Continue as needed antipyretics and bronchodilators -Maintain adequate hydration and follow clinical response. -continue the use of flutter valve  2-elevated troponin -Appears to be in the setting of acute pulmonary process -Patient denies chest pain -No acute ischemic changes on telemetry.  3-type 2 diabetes mellitus -A1c 5.8 -Continue holding oral hypoglycemic agents while inpatient -Continue sliding scale insulin. -Continue to follow  CBGs.  4-hypertension -Continue home antihypertensive agents. -Heart healthy diet encouraged.  5-hyperlipidemia -Continue Lipitor.  6-BPH -Continue Flomax -no urinary retention symptoms reported.  7-tobacco abuse -Cessation counseling provided -Continue the use of nicotine patch.  8-Sinusitis -Continue the use of Claritin and Flonase   DVT prophylaxis: Lovenox Code Status: Full code Family Communication: No family at bedside. Disposition:   Status is: Inpatient  Remains inpatient appropriate because:IV treatments appropriate due to intensity of illness or inability to take PO  Dispo: The patient is from: Home              Anticipated d/c is to: Home              Patient currently is not medically stable to d/c.   Difficult to place patient No       Consultants:  See below for x-ray reports  Procedures:  None  Antimicrobials:  Continue Rocephin and Zithromax.   Subjective: T-max overnight 101.4; currently afebrile; no chest pain, no nausea or vomiting.  Still short winded with activity.  No using accessory muscles or requiring oxygen supplementation.  Objective: Vitals:   03/17/21 0800 03/17/21 1138 03/17/21 1333 03/17/21 1543  BP: (!) 156/72   (!) 156/75  Pulse: 67   70  Resp: 20   20  Temp:  99.5 F (37.5 C) 99.4 F (37.4 C) 99.3 F (37.4 C)  TempSrc:  Oral Oral Oral  SpO2: 94%   97%  Weight:      Height:        Intake/Output Summary (Last 24 hours) at 03/17/2021 1748 Last data filed at 03/17/2021 1300 Gross per 24 hour  Intake 240 ml  Output --  Net 240 ml   [employer]  03/15/21 0927  Weight: 76.6 kg    Examination: General exam: Alert, awake, oriented x 3; continued spiking fever, reports no chest pain; vital feeling a slightly better.  No nausea or vomiting. Respiratory system: Positive rhonchi, no using accessory muscles.  Short winded with activity. Cardiovascular system:RRR. No murmurs, rubs, gallops. Gastrointestinal  system: Abdomen is nondistended, soft and nontender. No organomegaly or masses felt. Normal bowel sounds heard. Central nervous system: Alert and oriented. No focal neurological deficits. Extremities: No C/C/E, +pedal pulses Skin: No rashes, lesions or ulcers Psychiatry: Judgement and insight appear normal. Mood & affect appropriate.    Data Reviewed: I have personally reviewed following labs and imaging studies  CBC: Recent Labs  Lab 03/15/21 1010 03/16/21 0523  WBC 10.9* 8.0  NEUTROABS 9.6*  --   HGB 14.7 13.2  HCT 46.3 40.9  MCV 92.4 93.8  PLT 71* 60*    Basic Metabolic Panel: Recent Labs  Lab 03/15/21 1010 03/16/21 0523  NA 136 137  K 4.0 3.9  CL 102 103  CO2 22 26  GLUCOSE 155* 116*  BUN 24* 23  CREATININE 1.35* 1.12  CALCIUM 9.2 8.4*    GFR: Estimated Creatinine Clearance: 68.2 mL/min (by C-G formula based on SCr of 1.12 mg/dL).  Liver Function Tests: Recent Labs  Lab 03/15/21 1010  AST 25  ALT 32  ALKPHOS 75  BILITOT 1.5*  PROT 7.6  ALBUMIN 4.2    CBG: Recent Labs  Lab 03/16/21 1612 03/16/21 2019 03/17/21 0801 03/17/21 1131 03/17/21 1635  GLUCAP 119* 158* 136* 149* 111*    Recent Results (from the past 240 hour(s))  Resp Panel by RT-PCR (Flu A&B, Covid) Nasopharyngeal Swab     Status: None   Collection Time: 03/15/21 10:10 AM   Specimen: Nasopharyngeal Swab; Nasopharyngeal(NP) swabs in vial transport medium  Result Value Ref Range Status   SARS Coronavirus 2 by RT PCR NEGATIVE NEGATIVE Final    Comment: (NOTE) SARS-CoV-2 target nucleic acids are NOT DETECTED.  The SARS-CoV-2 RNA is generally detectable in upper respiratory specimens during the acute phase of infection. The lowest concentration of SARS-CoV-2 viral copies this assay can detect is 138 copies/mL. A negative result does not preclude SARS-Cov-2 infection and should not be used as the sole basis for treatment or other patient management decisions. A negative result may  occur with  improper specimen collection/handling, submission of specimen other than nasopharyngeal swab, presence of viral mutation(s) within the areas targeted by this assay, and inadequate number of viral copies(<138 copies/mL). A negative result must be combined with clinical observations, patient history, and epidemiological information. The expected result is Negative.  Fact Sheet for Patients:  EntrepreneurPulse.com.au  Fact Sheet for Healthcare Providers:  IncredibleEmployment.be  This test is no t yet approved or cleared by the [place] FDA and  has been authorized for detection and/or diagnosis of SARS-CoV-2 by FDA under an Emergency Use Authorization (EUA). This EUA will remain  in effect (meaning this test can be used) for the duration of the COVID-19 declaration under Section 564(b)(1) of the Act, 21 U.S.C.section 360bbb-3(b)(1), unless the authorization is terminated  or revoked sooner.       Influenza A by PCR NEGATIVE NEGATIVE Final   Influenza B by PCR NEGATIVE NEGATIVE Final    Comment: (NOTE) The Xpert Xpress SARS-CoV-2/FLU/RSV plus assay is intended as an aid in the diagnosis of influenza from Nasopharyngeal swab specimens and should not be used as a sole basis for treatment. Nasal washings and  aspirates are unacceptable for Xpert Xpress SARS-CoV-2/FLU/RSV testing.  Fact Sheet for Patients: EntrepreneurPulse.com.au  Fact Sheet for Healthcare Providers: IncredibleEmployment.be  This test is not yet approved or cleared by the [place] FDA and has been authorized for detection and/or diagnosis of SARS-CoV-2 by FDA under an Emergency Use Authorization (EUA). This EUA will remain in effect (meaning this test can be used) for the duration of the COVID-19 declaration under Section 564(b)(1) of the Act, 21 U.S.C. section 360bbb-3(b)(1), unless the authorization is terminated  or revoked.  Performed at [facility], [address], [place], [place] [address]   Culture, blood (routine x 2)     Status: None (Preliminary result)   Collection Time: 03/15/21 10:50 AM   Specimen: BLOOD LEFT FOREARM  Result Value Ref Range Status   Specimen Description BLOOD LEFT FOREARM  Final   Special Requests   Final    BOTTLES DRAWN AEROBIC AND ANAEROBIC Blood Culture adequate volume   Culture   Final    NO GROWTH < 24 HOURS Performed at [facility], [address], [place], [place] [address]    Report Status PENDING  Incomplete  Culture, blood (routine x 2)     Status: None (Preliminary result)   Collection Time: 03/15/21 10:50 AM   Specimen: Left Antecubital; Blood  Result Value Ref Range Status   Specimen Description LEFT ANTECUBITAL  Final   Special Requests   Final    BOTTLES DRAWN AEROBIC AND ANAEROBIC Blood Culture adequate volume   Culture   Final    NO GROWTH < 24 HOURS Performed at [facility], [address], [place], [place] [address]    Report Status PENDING  Incomplete     Radiology Studies: No results found.   Scheduled Meds:  aspirin EC  81 mg Oral Daily   atorvastatin  40 mg Oral Daily   azithromycin  500 mg Oral Daily   enoxaparin (LOVENOX) injection  40 mg Subcutaneous Q24H   fluticasone  1 spray Each Nare Daily   insulin aspart  0-15 Units Subcutaneous TID WC   insulin aspart  0-5 Units Subcutaneous QHS   loratadine  10 mg Oral Daily   metoprolol tartrate  25 mg Oral BID   nicotine  14 mg Transdermal Daily   saccharomyces boulardii  250 mg Oral BID   tamsulosin  0.4 mg Oral Daily   Continuous Infusions:  cefTRIAXone (ROCEPHIN)  IV 2 g (03/17/21 1137)   lactated ringers 125 mL/hr at 03/17/21 1539     LOS: 1 day    Time spent: 30 minutes   Barton Dubois, MD [facility]   To contact the attending provider between 7A-7P or the covering provider during after hours 7P-7A, please log into the web site [email or web address] and access  using universal [place] password for that web site. If you do not have the password, please call the hospital operator.  03/17/2021, 5:48 PM

## 2021-03-18 ENCOUNTER — Other Ambulatory Visit: Payer: Self-pay | Admitting: Cardiology

## 2021-03-18 DIAGNOSIS — I10 Essential (primary) hypertension: Secondary | ICD-10-CM | POA: Diagnosis not present

## 2021-03-18 DIAGNOSIS — N400 Enlarged prostate without lower urinary tract symptoms: Secondary | ICD-10-CM | POA: Diagnosis not present

## 2021-03-18 DIAGNOSIS — E1169 Type 2 diabetes mellitus with other specified complication: Secondary | ICD-10-CM | POA: Diagnosis not present

## 2021-03-18 DIAGNOSIS — J189 Pneumonia, unspecified organism: Secondary | ICD-10-CM | POA: Diagnosis not present

## 2021-03-18 DIAGNOSIS — R531 Weakness: Secondary | ICD-10-CM | POA: Diagnosis not present

## 2021-03-18 DIAGNOSIS — E785 Hyperlipidemia, unspecified: Secondary | ICD-10-CM | POA: Diagnosis not present

## 2021-03-18 LAB — GLUCOSE, CAPILLARY
Glucose-Capillary: 121 mg/dL — ABNORMAL HIGH (ref 70–99)
Glucose-Capillary: 101 mg/dL — ABNORMAL HIGH (ref 70–99)

## 2021-03-18 MED ORDER — NICOTINE 14 MG/24HR TD PT24: 14.0000 mg | MEDICATED_PATCH | Freq: Every day | TRANSDERMAL | 0 refills | Status: DC

## 2021-03-18 MED ORDER — DIPHENHYDRAMINE HCL 50 MG/ML IJ SOLN
25.0000 mg | Freq: Once | INTRAMUSCULAR | Status: AC
  Administered 2021-03-18: 25 mg via INTRAVENOUS
  Filled 2021-03-18: qty 1

## 2021-03-18 MED ORDER — AMOXICILLIN-POT CLAVULANATE 875-125 MG PO TABS: 1.0000 | ORAL_TABLET | Freq: Two times a day (BID) | ORAL | 0 refills | Status: AC

## 2021-03-18 MED ORDER — FLUTICASONE PROPIONATE 50 MCG/ACT NA SUSP: 1.0000 | Freq: Every day | NASAL | 0 refills | Status: DC

## 2021-03-18 MED ORDER — SACCHAROMYCES BOULARDII 250 MG PO CAPS: 250.0000 mg | ORAL_CAPSULE | Freq: Two times a day (BID) | ORAL | 0 refills | Status: DC

## 2021-03-18 NOTE — Progress Notes (Signed)
Nsg Discharge Note  Admit Date:  03/15/2021 Discharge date: 03/18/2021   Tawanna Sat to be D/C'd Home per MD order.  AVS completed.  Patient able to verbalize understanding.  Discharge Medication: Allergies as of 03/18/2021       Reactions   Meperidine Hcl Other (See Comments)   Was mixed with phenergan and turned blood vessels red.    Promethazine Hcl Other (See Comments)   Was mixed with the Demerol and caused veins to turn red.         Medication List     STOP taking these medications    diphenhydramine-acetaminophen 25-500 MG Tabs tablet Commonly known as: TYLENOL PM       TAKE these medications    acetaminophen 500 MG tablet Commonly known as: TYLENOL Take 500 mg by mouth at bedtime.   amoxicillin-clavulanate 875-125 MG tablet Commonly known as: Augmentin Take 1 tablet by mouth 2 (two) times daily for 5 days.   aspirin EC 81 MG tablet Take 1 tablet (81 mg total) by mouth daily.   atorvastatin 40 MG tablet Commonly known as: LIPITOR TAKE 1 TABLET BY MOUTH EVERY DAY   cetirizine 10 MG tablet Commonly known as: ZYRTEC Take 10 mg by mouth daily.   fluticasone 50 MCG/ACT nasal spray Commonly known as: FLONASE Place 1 spray into both nostrils daily. Start taking on: March 19, 2021   metFORMIN 1000 MG tablet Commonly known as: GLUCOPHAGE Take 1,000 mg by mouth 2 (two) times daily.   metoprolol tartrate 25 MG tablet Commonly known as: LOPRESSOR TAKE 1 TABLET BY MOUTH TWICE A DAY   nicotine 14 mg/24hr patch Commonly known as: NICODERM CQ - dosed in mg/24 hours Place 1 patch (14 mg total) onto the skin daily. Start taking on: March 19, 2021   OneTouch Ultra test strip Generic drug: glucose blood USE TO TEST SUGAR THREE TIMES DAILY   ramipril 10 MG capsule Commonly known as: ALTACE TAKE 2 CAPSULES BY MOUTH EVERY DAY WITH BREAKFAST What changed: See the new instructions.   saccharomyces boulardii 250 MG capsule Commonly known as:  FLORASTOR Take 1 capsule (250 mg total) by mouth 2 (two) times daily.   tamsulosin 0.4 MG Caps capsule Commonly known as: FLOMAX TAKE 1 CAPSULE BY MOUTH EVERY DAY        Discharge Assessment: Vitals:   03/18/21 0639 03/18/21 1247  BP: (!) 168/85 (!) 153/79  Pulse: 72 (!) 59  Resp: 18 18  Temp: (!) 100.4 F (38 C) 98.6 F (37 C)  SpO2: 95% 98%   Skin clean, dry and intact without evidence of skin break down, no evidence of skin tears noted. IV catheter discontinued intact. Site without signs and symptoms of complications - no redness or edema noted at insertion site, patient denies c/o pain - only slight tenderness at site.  Dressing with slight pressure applied.  D/c Instructions-Education: Discharge instructions given to patient/family with verbalized understanding. D/c education completed with patient/family including follow up instructions, medication list, d/c activities limitations if indicated, with other d/c instructions as indicated by MD - patient able to verbalize understanding, all questions fully answered. Patient instructed to return to ED, call 911, or call MD for any changes in condition.  Patient escorted via [place], and D/C home via private auto.  Maurica Omura C, RN 03/18/2021 2:10 PM

## 2021-03-18 NOTE — Discharge Summary (Signed)
Physician Discharge Summary  [name] [id number] DOB: 23-Oct-1950 DOA: 03/15/2021  PCP: Redmond School, MD  Admit date: 03/15/2021 Discharge date: 03/18/2021  Time spent: 35 minutes  Recommendations for Outpatient Follow-up:  1-Repeat basic metabolic panel to follow twice renal function 2-Repeat chest x-ray in 6-8 weeks to assure complete resolution of infiltrates. 3-continue assisting patient with tobacco cessation.   Discharge Diagnoses:  Active Problems:   Type 2 diabetes mellitus with hyperlipidemia (HCC)   CAP (community acquired pneumonia)   Weakness Elevated troponin Hypertension Hyperlipidemia BPH Tobacco abuse Chronic kidney disease stage IIIa  Discharge Condition: Stable and improved.  Discharged home with instruction to follow-up with PCP in 10 days.  Code status:   Diet recommendation: Heart healthy modified carbohydrate diet.  Filed Weights   03/15/21 0927  Weight: 76.6 kg    History of present illness:  [name] is a 70 y.o. male with medical history significant of HTN, HLD, type 2 diabetes with nephropathy, CKD stage 3a, anxiety, tobacco abuse and depression; who presented to Ed with complaints for URI symptoms for the last 4 days and worsening. Patient reported on the day of admission his symptoms were associated with dizziness and fall at home (no major injury or head trauma reported. Patient had decrease oral intake, chills, runny nose and productive cough. No CP, no hemoptysis, no vomiting, no dysuria, no melena, no hematochezia.    COVID PCR neg, no vaccination.   ED Course: CXR demonstrating right perihilar space opacities; patient curb to -Patient with history of diabetes 2 (with elevation In his BUN and age > 89); CT head neg for acute intracranial abnormalities outside from mild sinusitis. Cultures taken IVF's given and antibiotics initiated. TRH contacted to place in observation for further evaluation and management of  CAP.  Hospital Course:  1-community acquired pneumonia -Affecting right perihilar area. -Presumed to be bacterial in origin. -Still spiking fever with ongoing productive coughing spells -curb 65 score 2 (age and elevated BUN on admission) -Patient discharged on Augmentin to complete antibiotic therapy. -Continue as needed antipyretics and the use of flutter valve. -Maintain adequate hydration and repeat chest x-ray in 6-8 weeks to assure resolution of infiltrates.   2-elevated troponin -Appears to be in the setting of acute pulmonary process -Patient denies chest pain -No acute ischemic changes on telemetry or EKG.   3-type 2 diabetes mellitus -A1c 5.8 -Resume home oral hypoglycemic agents -Modify carbohydrate diet encouraged -Continue outpatient follow-up with PCP to further adjust hypoglycemic regimen as required.   4-hypertension -Continue home antihypertensive agents. -Heart healthy/low-sodium diet encouraged.   5-hyperlipidemia -Continue Lipitor.   6-BPH -Continue Flomax -no urinary retention symptoms reported.   7-tobacco abuse -Cessation counseling provided -Continue the use of nicotine patch.   8-Sinusitis -Continue the use of Claritin and Flonase  9-CKD stage 3a -stable and at baseline  -continue minimizing nephrotoxic agents -follow renal function trend   Procedures: See below for x-ray reports.  Consultations: None  Discharge Exam: Vitals:   03/18/21 0639 03/18/21 1247  BP: (!) 168/85 (!) 153/79  Pulse: 72 (!) 59  Resp: 18 18  Temp: (!) 100.4 F (38 C) 98.6 F (37 C)  SpO2: 95% 98%   General exam: Alert, awake, oriented x 3; patient reports feeling good, improved breathing with activity and no requiring oxygen supplementation.  Still intermittent coughing spells reported.  Afebrile for 24 hours. Respiratory system: Positive rhonchi, no using accessory muscles.  Short winded with activity. Cardiovascular system:RRR. No murmurs, rubs,  gallops.  Gastrointestinal system: Abdomen is nondistended, soft and nontender. No organomegaly or masses felt. Normal bowel sounds heard. Central nervous system: Alert and oriented. No focal neurological deficits. Extremities: No C/C/E, +pedal pulses Skin: No rashes, lesions or ulcers Psychiatry: Judgement and insight appear normal. Mood & affect appropriate.    Discharge Instructions   Discharge Instructions     Diet - low sodium heart healthy   Complete by: As directed    Discharge instructions   Complete by: As directed    Stop smoking Continue to follow modified carbohydrate and heart healthy diet Take medications as prescribed Maintain adequate hydration Arrange follow-up with PCP in 10 days      Allergies as of 03/18/2021       Reactions   Meperidine Hcl Other (See Comments)   Was mixed with phenergan and turned blood vessels red.    Promethazine Hcl Other (See Comments)   Was mixed with the Demerol and caused veins to turn red.         Medication List     STOP taking these medications    diphenhydramine-acetaminophen 25-500 MG Tabs tablet Commonly known as: TYLENOL PM       TAKE these medications    acetaminophen 500 MG tablet Commonly known as: TYLENOL Take 500 mg by mouth at bedtime.   amoxicillin-clavulanate 875-125 MG tablet Commonly known as: Augmentin Take 1 tablet by mouth 2 (two) times daily for 5 days.   aspirin EC 81 MG tablet Take 1 tablet (81 mg total) by mouth daily.   atorvastatin 40 MG tablet Commonly known as: LIPITOR TAKE 1 TABLET BY MOUTH EVERY DAY   cetirizine 10 MG tablet Commonly known as: ZYRTEC Take 10 mg by mouth daily.   fluticasone 50 MCG/ACT nasal spray Commonly known as: FLONASE Place 1 spray into both nostrils daily. Start taking on: March 19, 2021   metFORMIN 1000 MG tablet Commonly known as: GLUCOPHAGE Take 1,000 mg by mouth 2 (two) times daily.   metoprolol tartrate 25 MG tablet Commonly known as:  LOPRESSOR TAKE 1 TABLET BY MOUTH TWICE A DAY   nicotine 14 mg/24hr patch Commonly known as: NICODERM CQ - dosed in mg/24 hours Place 1 patch (14 mg total) onto the skin daily. Start taking on: March 19, 2021   OneTouch Ultra test strip Generic drug: glucose blood USE TO TEST SUGAR THREE TIMES DAILY   ramipril 10 MG capsule Commonly known as: ALTACE TAKE 2 CAPSULES BY MOUTH EVERY DAY WITH BREAKFAST What changed: See the new instructions.   saccharomyces boulardii 250 MG capsule Commonly known as: FLORASTOR Take 1 capsule (250 mg total) by mouth 2 (two) times daily.   tamsulosin 0.4 MG Caps capsule Commonly known as: FLOMAX TAKE 1 CAPSULE BY MOUTH EVERY DAY       Allergies  Allergen Reactions   Meperidine Hcl Other (See Comments)    Was mixed with phenergan and turned blood vessels red.    Promethazine Hcl Other (See Comments)    Was mixed with the Demerol and caused veins to turn red.     Follow-up Information     Redmond School, MD. Schedule an appointment as soon as possible for a visit in 10 day(s).   Specialty: Internal Medicine Contact information: [address] [place] [id number] [phone]         Arnoldo Lenis, MD .   Specialty: Cardiology Contact information: [address] [place] [address] [phone]  The results of significant diagnostics from this hospitalization (including imaging, microbiology, ancillary and laboratory) are listed below for reference.    Significant Diagnostic Studies: CT HEAD WO CONTRAST (5MM)  Result Date: 03/15/2021 CLINICAL DATA:  Neuro deficit, acute, stroke suspected 3 days of dizziness EXAM: CT HEAD WITHOUT CONTRAST TECHNIQUE: Contiguous axial images were obtained from the base of the skull through the vertex without intravenous contrast. COMPARISON:  None. FINDINGS: Brain: No evidence of acute intracranial hemorrhage or extra-axial collection.No evidence of mass  lesion/concern mass effect.The ventricles are normal in size. Vascular: No hyperdense vessel or unexpected calcification. Skull: Normal. Negative for fracture or focal lesion. Sinuses/Orbits: Mild paranasal sinus mucosal thickening of the ethmoid air cells and right maxillary sinus. Other: None. IMPRESSION: No acute intracranial abnormality. Electronically Signed   By: Maurine Simmering M.D.   On: 03/15/2021 14:49   DG Chest Portable 1 View  Result Date: 03/15/2021 CLINICAL DATA:  Shortness of breath, chills, congestion EXAM: PORTABLE CHEST 1 VIEW COMPARISON:  11/10/2012 FINDINGS: Heart is normal size. Prior CABG. Right perihilar airspace opacity. No confluent opacity on the left. No effusions or acute bony abnormality. IMPRESSION: Right perihilar airspace opacity concerning for pneumonia. Followup PA and lateral chest X-ray is recommended in 3-4 weeks following trial of antibiotic therapy to ensure resolution and exclude underlying malignancy. Prior CABG. Electronically Signed   By: Rolm Baptise M.D.   On: 03/15/2021 10:05    Microbiology: Recent Results (from the past 240 hour(s))  Resp Panel by RT-PCR (Flu A&B, Covid) Nasopharyngeal Swab     Status: None   Collection Time: 03/15/21 10:10 AM   Specimen: Nasopharyngeal Swab; Nasopharyngeal(NP) swabs in vial transport medium  Result Value Ref Range Status   SARS Coronavirus 2 by RT PCR NEGATIVE NEGATIVE Final    Comment: (NOTE) SARS-CoV-2 target nucleic acids are NOT DETECTED.  The SARS-CoV-2 RNA is generally detectable in upper respiratory specimens during the acute phase of infection. The lowest concentration of SARS-CoV-2 viral copies this assay can detect is 138 copies/mL. A negative result does not preclude SARS-Cov-2 infection and should not be used as the sole basis for treatment or other patient management decisions. A negative result may occur with  improper specimen collection/handling, submission of specimen other than nasopharyngeal  swab, presence of viral mutation(s) within the areas targeted by this assay, and inadequate number of viral copies(<138 copies/mL). A negative result must be combined with clinical observations, patient history, and epidemiological information. The expected result is Negative.  Fact Sheet for Patients:  EntrepreneurPulse.com.au  Fact Sheet for Healthcare Providers:  IncredibleEmployment.be  This test is no t yet approved or cleared by the [place] FDA and  has been authorized for detection and/or diagnosis of SARS-CoV-2 by FDA under an Emergency Use Authorization (EUA). This EUA will remain  in effect (meaning this test can be used) for the duration of the COVID-19 declaration under Section 564(b)(1) of the Act, 21 U.S.C.section 360bbb-3(b)(1), unless the authorization is terminated  or revoked sooner.       Influenza A by PCR NEGATIVE NEGATIVE Final   Influenza B by PCR NEGATIVE NEGATIVE Final    Comment: (NOTE) The Xpert Xpress SARS-CoV-2/FLU/RSV plus assay is intended as an aid in the diagnosis of influenza from Nasopharyngeal swab specimens and should not be used as a sole basis for treatment. Nasal washings and aspirates are unacceptable for Xpert Xpress SARS-CoV-2/FLU/RSV testing.  Fact Sheet for Patients: EntrepreneurPulse.com.au  Fact Sheet for Healthcare Providers: IncredibleEmployment.be  This test is  not yet approved or cleared by the [place] and has been authorized for detection and/or diagnosis of SARS-CoV-2 by FDA under an Emergency Use Authorization (EUA). This EUA will remain in effect (meaning this test can be used) for the duration of the COVID-19 declaration under Section 564(b)(1) of the Act, 21 U.S.C. section 360bbb-3(b)(1), unless the authorization is terminated or revoked.  Performed at [facility], [address] [place], [place] [address]   Culture, blood  (routine x 2)     Status: None (Preliminary result)   Collection Time: 03/15/21 10:50 AM   Specimen: BLOOD LEFT FOREARM  Result Value Ref Range Status   Specimen Description BLOOD LEFT FOREARM  Final   Special Requests   Final    BOTTLES DRAWN AEROBIC AND ANAEROBIC Blood Culture adequate volume   Culture   Final    NO GROWTH 3 DAYS Performed at [facility], [address] [place], [place] [address]    Report Status PENDING  Incomplete  Culture, blood (routine x 2)     Status: None (Preliminary result)   Collection Time: 03/15/21 10:50 AM   Specimen: Left Antecubital; Blood  Result Value Ref Range Status   Specimen Description LEFT ANTECUBITAL  Final   Special Requests   Final    BOTTLES DRAWN AEROBIC AND ANAEROBIC Blood Culture adequate volume   Culture   Final    NO GROWTH 3 DAYS Performed at [facility], [address], [place], [place] [address]    Report Status PENDING  Incomplete     Labs: Basic Metabolic Panel: Recent Labs  Lab 03/15/21 1010 03/16/21 0523  NA 136 137  K 4.0 3.9  CL 102 103  CO2 22 26  GLUCOSE 155* 116*  BUN 24* 23  CREATININE 1.35* 1.12  CALCIUM 9.2 8.4*   Liver Function Tests: Recent Labs  Lab 03/15/21 1010  AST 25  ALT 32  ALKPHOS 75  BILITOT 1.5*  PROT 7.6  ALBUMIN 4.2   CBC: Recent Labs  Lab 03/15/21 1010 03/16/21 0523  WBC 10.9* 8.0  NEUTROABS 9.6*  --   HGB 14.7 13.2  HCT 46.3 40.9  MCV 92.4 93.8  PLT 71* 60*   CBG: Recent Labs  Lab 03/17/21 1131 03/17/21 1635 03/17/21 2153 03/18/21 0734 03/18/21 1134  GLUCAP 149* 111* 122* 121* 101*    Signed:  Barton Dubois MD.  [facility] 03/18/2021, 1:33 PM

## 2021-03-20 LAB — CULTURE, BLOOD (ROUTINE X 2)
Culture: NO GROWTH
Culture: NO GROWTH
Special Requests: ADEQUATE
Special Requests: ADEQUATE

## 2021-03-27 DIAGNOSIS — J189 Pneumonia, unspecified organism: Secondary | ICD-10-CM | POA: Diagnosis not present

## 2021-03-27 DIAGNOSIS — E1151 Type 2 diabetes mellitus with diabetic peripheral angiopathy without gangrene: Secondary | ICD-10-CM | POA: Diagnosis not present

## 2021-03-28 ENCOUNTER — Other Ambulatory Visit (HOSPITAL_COMMUNITY): Payer: Self-pay | Admitting: Physician Assistant

## 2021-03-28 DIAGNOSIS — J189 Pneumonia, unspecified organism: Secondary | ICD-10-CM

## 2021-04-06 DIAGNOSIS — N529 Male erectile dysfunction, unspecified: Secondary | ICD-10-CM | POA: Diagnosis not present

## 2021-04-06 DIAGNOSIS — Z7982 Long term (current) use of aspirin: Secondary | ICD-10-CM | POA: Diagnosis not present

## 2021-04-06 DIAGNOSIS — I499 Cardiac arrhythmia, unspecified: Secondary | ICD-10-CM | POA: Diagnosis not present

## 2021-04-06 DIAGNOSIS — E785 Hyperlipidemia, unspecified: Secondary | ICD-10-CM | POA: Diagnosis not present

## 2021-04-06 DIAGNOSIS — Z7984 Long term (current) use of oral hypoglycemic drugs: Secondary | ICD-10-CM | POA: Diagnosis not present

## 2021-04-06 DIAGNOSIS — Z7722 Contact with and (suspected) exposure to environmental tobacco smoke (acute) (chronic): Secondary | ICD-10-CM | POA: Diagnosis not present

## 2021-04-06 DIAGNOSIS — Z87891 Personal history of nicotine dependence: Secondary | ICD-10-CM | POA: Diagnosis not present

## 2021-04-06 DIAGNOSIS — I25119 Atherosclerotic heart disease of native coronary artery with unspecified angina pectoris: Secondary | ICD-10-CM | POA: Diagnosis not present

## 2021-04-06 DIAGNOSIS — I10 Essential (primary) hypertension: Secondary | ICD-10-CM | POA: Diagnosis not present

## 2021-04-06 DIAGNOSIS — E1165 Type 2 diabetes mellitus with hyperglycemia: Secondary | ICD-10-CM | POA: Diagnosis not present

## 2021-04-06 DIAGNOSIS — I252 Old myocardial infarction: Secondary | ICD-10-CM | POA: Diagnosis not present

## 2021-04-06 DIAGNOSIS — N400 Enlarged prostate without lower urinary tract symptoms: Secondary | ICD-10-CM | POA: Diagnosis not present

## 2021-04-07 DIAGNOSIS — J189 Pneumonia, unspecified organism: Secondary | ICD-10-CM | POA: Diagnosis not present

## 2021-04-07 DIAGNOSIS — R531 Weakness: Secondary | ICD-10-CM | POA: Diagnosis not present

## 2021-04-25 ENCOUNTER — Other Ambulatory Visit: Payer: Self-pay

## 2021-04-25 ENCOUNTER — Ambulatory Visit (HOSPITAL_COMMUNITY)
Admission: RE | Admit: 2021-04-25 | Discharge: 2021-04-25 | Disposition: A | Discharge status: Home / Self Care | Payer: Medicare HMO | Source: Ambulatory Visit | Attending: Physician Assistant | Admitting: Physician Assistant

## 2021-04-25 DIAGNOSIS — J189 Pneumonia, unspecified organism: Secondary | ICD-10-CM | POA: Insufficient documentation

## 2021-06-15 ENCOUNTER — Ambulatory Visit (HOSPITAL_COMMUNITY)
Admission: RE | Admit: 2021-06-15 | Discharge: 2021-06-15 | Disposition: A | Discharge status: Home / Self Care | Payer: Medicare HMO | Source: Ambulatory Visit | Attending: Physician Assistant | Admitting: Physician Assistant

## 2021-06-15 ENCOUNTER — Other Ambulatory Visit: Payer: Self-pay

## 2021-06-15 ENCOUNTER — Encounter: Payer: Self-pay | Admitting: Urology

## 2021-06-15 ENCOUNTER — Ambulatory Visit: Payer: Medicare HMO | Admitting: Urology

## 2021-06-15 ENCOUNTER — Other Ambulatory Visit: Payer: Self-pay | Admitting: Cardiology

## 2021-06-15 VITALS — BP 179/82 | HR 76

## 2021-06-15 DIAGNOSIS — K7689 Other specified diseases of liver: Secondary | ICD-10-CM | POA: Diagnosis not present

## 2021-06-15 DIAGNOSIS — R310 Gross hematuria: Secondary | ICD-10-CM | POA: Diagnosis not present

## 2021-06-15 DIAGNOSIS — N401 Enlarged prostate with lower urinary tract symptoms: Secondary | ICD-10-CM | POA: Diagnosis not present

## 2021-06-15 DIAGNOSIS — N281 Cyst of kidney, acquired: Secondary | ICD-10-CM | POA: Diagnosis not present

## 2021-06-15 DIAGNOSIS — R319 Hematuria, unspecified: Secondary | ICD-10-CM | POA: Diagnosis not present

## 2021-06-15 DIAGNOSIS — R1012 Left upper quadrant pain: Secondary | ICD-10-CM | POA: Insufficient documentation

## 2021-06-15 DIAGNOSIS — N200 Calculus of kidney: Secondary | ICD-10-CM | POA: Diagnosis not present

## 2021-06-15 DIAGNOSIS — N138 Other obstructive and reflux uropathy: Secondary | ICD-10-CM

## 2021-06-15 LAB — URINALYSIS, ROUTINE W REFLEX MICROSCOPIC
Bilirubin, UA: NEGATIVE
Glucose, UA: NEGATIVE
Ketones, UA: NEGATIVE
Leukocytes,UA: NEGATIVE
Nitrite, UA: NEGATIVE
Specific Gravity, UA: <1.005 — ABNORMAL LOW (ref 1.005–1.030)
Urobilinogen, Ur: 0.2 mg/dL (ref 0.2–1.0)
pH, UA: 5.5 (ref 5.0–7.5)

## 2021-06-15 LAB — MICROSCOPIC EXAMINATION
Bacteria, UA: NONE SEEN
RBC, Urine: >30 /HPF — AB (ref 0–2)
Renal Epithel, UA: NONE SEEN /HPF
WBC, UA: NONE SEEN /HPF (ref 0–5)

## 2021-06-15 LAB — BLADDER SCAN AMB NON-IMAGING: Scan Result: 94

## 2021-06-15 MED ORDER — FINASTERIDE 5 MG PO TABS: 5.0000 mg | ORAL_TABLET | Freq: Every day | ORAL | 3 refills | Status: DC

## 2021-06-15 NOTE — Progress Notes (Signed)
Subjective:  1. Gross hematuria   2. Left upper quadrant abdominal pain   3. Renal stones   4. BPH with urinary obstruction     Yvone Neu returns in f/u.  He had a cystolithalopaxy on 10/29/19 for a 2.6cm stone.  He is doing well without hematuria and minimal LUTS with an IPSS of 1.    He has some BPH with BOO and has had a good response to tamsulosin in the past but his LUTS haven't been too severe.   06/15/21  Pt is a 70YO male with h/o renal and bladder stones who presents with c/o 4 day h/o left sided abdominal pain radiating to the lower abdomen, gross hematuria and passage of clots. No dysuria, burning. Pt denies fever, chills, back pain, nausea, vomiting. Pt on ASA daily. Otherwise, has been doing well with LUTS on Flomax daily. Pt also requests change of Rxs as tamsolosin will become tier 2 with his new insurance. IPPS score=4 PVR=4ml  CT shows stable renal stones with the largest in the [facility] about 7-18mm.  No hydro is noted.  He has prostate enlargement but no obvious bladder lesions.  There is some interval enlargement in the AAA.         ROS:  ROS:  A complete review of systems was performed.  All systems are negative except for pertinent findings as noted.   ROS  Allergies  Allergen Reactions   Meperidine Hcl Other (See Comments)    Was mixed with phenergan and turned blood vessels red.    Promethazine Hcl Other (See Comments)    Was mixed with the Demerol and caused veins to turn red.     Outpatient Encounter Medications as of 06/15/2021  Medication Sig   acetaminophen (TYLENOL) 500 MG tablet Take 500 mg by mouth at bedtime.   aspirin EC 81 MG tablet Take 1 tablet (81 mg total) by mouth daily.   atorvastatin (LIPITOR) 40 MG tablet Take 1 tablet (40 mg total) by mouth daily.   cetirizine (ZYRTEC) 10 MG tablet Take 10 mg by mouth daily.   finasteride (PROSCAR) 5 MG tablet Take 1 tablet (5 mg total) by mouth daily.   fluticasone (FLONASE) 50 MCG/ACT nasal spray Place 1  spray into both nostrils daily.   metFORMIN (GLUCOPHAGE) 1000 MG tablet Take 1,000 mg by mouth 2 (two) times daily.   nicotine (NICODERM CQ - DOSED IN MG/24 HOURS) 14 mg/24hr patch Place 1 patch (14 mg total) onto the skin daily.   ONETOUCH ULTRA test strip USE TO TEST SUGAR THREE TIMES DAILY   ramipril (ALTACE) 10 MG capsule TAKE 2 CAPSULES BY MOUTH EVERY DAY WITH BREAKFAST (Patient taking differently: Take 20 mg by mouth daily.)   saccharomyces boulardii (FLORASTOR) 250 MG capsule Take 1 capsule (250 mg total) by mouth 2 (two) times daily.   tamsulosin (FLOMAX) 0.4 MG CAPS capsule TAKE 1 CAPSULE BY MOUTH EVERY DAY (Patient taking differently: Take 0.4 mg by mouth daily.)   [DISCONTINUED] metoprolol tartrate (LOPRESSOR) 25 MG tablet Take 1 tablet (25 mg total) by mouth 2 (two) times daily.   No facility-administered encounter medications on file as of 06/15/2021.    Past Medical History:  Diagnosis Date   AAA (abdominal aortic aneurysm)    3.3 cm in 2012   ABDOMINAL AORTIC ANEURYSM 11/07/2009   Diameter of 3.3 cm by CT in 12/2010    Anxiety    ASCVD (arteriosclerotic cardiovascular disease)    LAD stent in 1999; RCA bare-metal stent  in 12/2007, LAD stent patent, 50-60% proximal Cx lesion treated medically; 09/2012: CABG x3, LIMA to LAD, Sequential SVG to ramus intermediate and OM branch with EVH via right thigh   Coronary artery disease    Depression    Diabetes mellitus    DIABETES MELLITUS, TYPE II 08/09/2010   no insulin     ED (erectile dysfunction)    Gastric ulcer    Hepatic steatosis    Hyperlipidemia    HYPERLIPIDEMIA 08/09/2010   Lipid profile in 07/2010:144, 124, 34, 86; 01/2012:93, 53, 69, 43    Hypertension    Insomnia    MI (myocardial infarction) ([place])    Nephrolithiasis    NEPHROLITHIASIS, RECURRENT 08/09/2010   Obstruction of the ureter on CT scan of the abdomen resulting in right hydronephrosis in 12/2010; bladder outlet obstruction prior to that.  CT also revealed  atherosclerotic changes in the abdominal arterial vessels, hepatic granuloma and calcification of the prostate.    S/P CABG x 3 10/07/2012   LIMA to LAD, Sequential SVG to ramus intermediate and OM branch with EVH via right thigh   THROMBOCYTOPENIA 08/09/2010   Platelets 88-114,000 over the past 4 years; prior evaluation by Dr. Tressie Stalker without a specific etiology identified.  01/2012: Normal CBC ex platelets-120    Thrombocytopenia ([place])    Chronic; evaluated by Dr. Tressie Stalker in 2007   Tobacco abuse     Past Surgical History:  Procedure Laterality Date   COLONOSCOPY N/A 01/19/2021   Procedure: COLONOSCOPY;  Surgeon: Rogene Houston, MD;  [facility]: [facility];  Service: Endoscopy;  Laterality: N/A;  12:30   CORONARY ANGIOPLASTY WITH STENT PLACEMENT     1999, 2009   CORONARY ARTERY BYPASS GRAFT N/A 10/07/2012   Procedure: CORONARY ARTERY BYPASS GRAFTING (CABG);  Surgeon: Rexene Alberts, MD;  Location: [facility];  Service: Open Heart Surgery;  Laterality: N/A;   CYSTOSCOPY WITH LITHOLAPAXY N/A 10/29/2019   Procedure: CYSTOSCOPY WITH LITHOLAPAXY;  Surgeon: Irine Seal, MD;  Location: [facility];  Service: Urology;  Laterality: N/A;   CYSTOSCOPY WITH RETROGRADE PYELOGRAM, URETEROSCOPY AND STENT PLACEMENT Left 08/30/2015   Procedure: CYSTOSCOPY LEFT RETROGRADE PYELOGRAM  LEFT   URETEROSCOPY,  LITHOLAPAXY;  Surgeon: Irine Seal, MD;  Location: [facility];  Service: Urology;  Laterality: Left;   ESOPHAGOGASTRODUODENOSCOPY  04/04/2012   Procedure: ESOPHAGOGASTRODUODENOSCOPY (EGD);  Surgeon: Rogene Houston, MD;  [facility];  Service: Endoscopy;  Laterality: N/A;  830   ESOPHAGOGASTRODUODENOSCOPY  07/25/2012   Procedure: ESOPHAGOGASTRODUODENOSCOPY (EGD);  Surgeon: Rogene Houston, MD;  [facility];  Service: Endoscopy;  Laterality: N/A;  830   HOLMIUM LASER APPLICATION Left 7/84/6962   Procedure: HOLMIUM LASER APPLICATION;  Surgeon: Irine Seal, MD;  Location: [facility];  Service: Urology;   Laterality: Left;   INGUINAL HERNIA REPAIR     INTRAOPERATIVE TRANSESOPHAGEAL ECHOCARDIOGRAM N/A 10/07/2012   Procedure: INTRAOPERATIVE TRANSESOPHAGEAL ECHOCARDIOGRAM;  Surgeon: Rexene Alberts, MD;  Location: [facility];  Service: Open Heart Surgery;  Laterality: N/A;   LEFT HEART CATHETERIZATION WITH CORONARY ANGIOGRAM N/A 10/03/2012   Procedure: LEFT HEART CATHETERIZATION WITH CORONARY ANGIOGRAM;  Surgeon: Minus Breeding, MD;  Location[facility];  Service: Cardiovascular;  Laterality: N/A;   LITHOTRIPSY     POLYPECTOMY  01/19/2021   Procedure: POLYPECTOMY;  Surgeon: Rogene Houston, MD;  [facility];  Service: Endoscopy;;    Social History   Socioeconomic History   Marital status: Married    Spouse name: Not  on file   Number of children: Not on file   Years of education: Not on file   Highest education level: Not on file  Occupational History   Not on file  Tobacco Use   Smoking status: Every Day    Packs/day: 0.50    Years: 40.00    Pack years: 20.00    Types: Cigarettes    Last attempt to quit: 07/09/2013    Years since quitting: 7.9   Smokeless tobacco: Never   Tobacco comments:    6-7 cigs. daily   Vaping Use   Vaping Use: Never used  Substance and Sexual Activity   Alcohol use: No    Alcohol/week: 0.0 standard drinks   Drug use: No   Sexual activity: Not on file  Other Topics Concern   Not on file  Social History Narrative   No regular exercise   Social Determinants of Health   Financial Resource Strain: Not on file  Food Insecurity: Not on file  Transportation Needs: Not on file  Physical Activity: Not on file  Stress: Not on file  Social Connections: Not on file  Intimate Partner Violence: Not on file    Family History  Problem Relation Age of Onset   Diabetes Mother    Ovarian cancer Sister        Objective: Vitals:   06/15/21 1004  BP: (!) 179/82  Pulse: 76     Physical Exam Vitals reviewed.  Constitutional:      Appearance:  Normal appearance.  Abdominal:     General: Abdomen is flat.     Palpations: Abdomen is soft.     Tenderness: There is abdominal tenderness (mild LUQ).  Neurological:     Mental Status: He is alert.    Lab Results:  Results for orders placed or performed in visit on 06/15/21 (from the past 24 hour(s))  Urinalysis, Routine w reflex microscopic     Status: Abnormal   Collection Time: 06/15/21  5:17 PM  Result Value Ref Range   Specific Gravity, UA <1.005 (L) 1.005 - 1.030   pH, UA 5.5 5.0 - 7.5   Color, UA Red (A) Yellow   Appearance Ur Cloudy (A) Clear   Leukocytes,UA Negative Negative   Protein,UA 2+ (A) Negative/Trace   Glucose, UA Negative Negative   Ketones, UA Negative Negative   RBC, UA 3+ (A) Negative   Bilirubin, UA Negative Negative   Urobilinogen, Ur 0.2 0.2 - 1.0 mg/dL   Nitrite, UA Negative Negative   Microscopic Examination See below:    Narrative   Performed at:  [facility] [address], [place], [place]  [id number] Lab Director: Mina Marble MT, Phone:  [phone]  Microscopic Examination     Status: Abnormal   Collection Time: 06/15/21  5:17 PM   Urine  Result Value Ref Range   WBC, UA None seen 0 - 5 /hpf   RBC >30 (A) 0 - 2 /hpf   Epithelial Cells (non renal) 0-10 0 - 10 /hpf   Renal Epithel, UA None seen None seen /hpf   Mucus, UA Present Not Estab.   Bacteria, UA None seen None seen/Few   Narrative   Performed at:  [facility] [address], [place], [place]  [id number] Lab Director: [place], Phone:  [phone]     BMET No results for input(s): NA, K, CL, CO2, GLUCOSE, BUN, CREATININE, CALCIUM in the last 72 hours. PSA No results found for:  PSA No results found for: TESTOSTERONE    Studies/Results:  CT films and report reviewed.     Assessment & Plan:  Gross hematuria of uncertain origin.  His stones don't appear likely to be the cause.  He likely has prostate bleeding,   I will start him on  finasteride and reviewed the side effects.  He will return for cystoscopy.  He will hold the aspirin for now.   Renal stones without obstruction or significant growth.  He has some LUQ pain but it is mild and it is unlikely from the stones.   Renal cysts noted on CT.  AAA.  He has some enlargement but is being followed for this already and the size increased appears minimal since 2020.    BPH with BOO with mild LUTS and an IPSS of 1..   He will stay on the tamsulosin at this time.  Finasteride added as well.    Meds ordered this encounter  Medications   finasteride (PROSCAR) 5 MG tablet    Sig: Take 1 tablet (5 mg total) by mouth daily.    Dispense:  90 tablet    Refill:  3      Orders Placed This Encounter  Procedures   Urine Culture   Microscopic Examination   CT RENAL STONE STUDY    Order Specific Question:   Preferred imaging location?    Answer:   [facility]   Urinalysis, Routine w reflex microscopic   BLADDER SCAN AMB NON-IMAGING       Return for Next available for cystoscopy. .   CC: Redmond School, MD      Irine Seal 06/15/2021 Patient ID: Tawanna Sat, male   DOB: 25-Jun-1951, 70 y.o.   MRN: [id number] Patient ID: RAMEL TOBON, male   DOB: 05-07-1951, 70 y.o.   MRN: [id number]

## 2021-06-15 NOTE — Progress Notes (Signed)
post void residual=94 

## 2021-06-15 NOTE — Progress Notes (Signed)
Urological Symptom Review  Patient is experiencing the following symptoms: Blood in urine   Review of Systems  Gastrointestinal (upper)  : Negative for upper GI symptoms  Gastrointestinal (lower) : Negative for lower GI symptoms  Constitutional : Negative for symptoms  Skin: Negative for skin symptoms  Eyes: Negative for eye symptoms  Ear/Nose/Throat : Negative for Ear/Nose/Throat symptoms  Hematologic/Lymphatic: Negative for Hematologic/Lymphatic symptoms  Cardiovascular : Negative for cardiovascular symptoms  Respiratory : Negative for respiratory symptoms  Endocrine: Negative for endocrine symptoms  Musculoskeletal: Joint pain  Neurological: Negative for neurological symptoms  Psychologic: Negative for psychiatric symptoms

## 2021-06-17 LAB — URINE CULTURE: Organism ID, Bacteria: NO GROWTH

## 2021-06-19 ENCOUNTER — Telehealth: Payer: Self-pay

## 2021-06-19 NOTE — Telephone Encounter (Signed)
Patient returned call and made aware.

## 2021-06-19 NOTE — Telephone Encounter (Signed)
-----   Message from Irine Seal, MD sent at 06/19/2021 12:12 PM EST ----- Urine culture is negative.  ----- Message ----- From: Iris Pert, LPN Sent: [phone]   4:05 PM EST To: Irine Seal, MD  Please review

## 2021-06-19 NOTE — Telephone Encounter (Signed)
Patient called with no answer. Message left to return call to office. 

## 2021-06-28 ENCOUNTER — Other Ambulatory Visit: Payer: Self-pay | Admitting: Cardiology

## 2021-07-04 ENCOUNTER — Other Ambulatory Visit: Payer: Self-pay | Admitting: Urology

## 2021-07-27 ENCOUNTER — Telehealth: Payer: Self-pay | Admitting: Cardiology

## 2021-07-27 NOTE — Telephone Encounter (Signed)
Renal  CT done 06/2021  3. Slight interval increase in size of the fusiform infrarenal abdominal aortic aneurysm now measuring 4.4 x 4.4 cm previously 4.3 x 3.9 cm. Recommend follow-up every 12 months and vascular consultation. Reference: J Am Coll Radiol      I will forward to Dr.Branch for dispo

## 2021-07-27 NOTE — Telephone Encounter (Signed)
Patient walked into the office wanting to initially schedule his upcoming recall. While I was doing so he told me that he had a abdominal aorta duplex scheduled back in September and he was hospitalized, because of that he missed his test. He then stated to me that he had a CT Renal Stone Study done that Dr.Wreen his urologist ordered. He stated to me that the "Dr was able to see my aneurysm can Branch use that." Told pt I would ask Dr. Harl Bowie about that and, get back with him. He is scheduled for March 14th with APP. TIA

## 2021-08-01 NOTE — Telephone Encounter (Signed)
We can use the CT results as the current measure of his aneurysm. Will talk about repeat imaging in 1 year at his March appt   Zandra Abts MD

## 2021-08-02 NOTE — Telephone Encounter (Signed)
Left a message for pt to call back

## 2021-08-03 ENCOUNTER — Other Ambulatory Visit: Payer: Self-pay

## 2021-08-03 ENCOUNTER — Encounter: Payer: Self-pay | Admitting: Urology

## 2021-08-03 ENCOUNTER — Ambulatory Visit: Payer: Medicare HMO | Admitting: Urology

## 2021-08-03 VITALS — BP 169/70 | HR 73 | Wt 166.0 lb

## 2021-08-03 DIAGNOSIS — R350 Frequency of micturition: Secondary | ICD-10-CM | POA: Diagnosis not present

## 2021-08-03 DIAGNOSIS — R310 Gross hematuria: Secondary | ICD-10-CM

## 2021-08-03 DIAGNOSIS — N138 Other obstructive and reflux uropathy: Secondary | ICD-10-CM

## 2021-08-03 DIAGNOSIS — N401 Enlarged prostate with lower urinary tract symptoms: Secondary | ICD-10-CM

## 2021-08-03 LAB — URINALYSIS, ROUTINE W REFLEX MICROSCOPIC
Bilirubin, UA: NEGATIVE
Glucose, UA: NEGATIVE
Ketones, UA: NEGATIVE
Leukocytes,UA: NEGATIVE
Nitrite, UA: NEGATIVE
Specific Gravity, UA: 1.01 (ref 1.005–1.030)
Urobilinogen, Ur: 0.2 mg/dL (ref 0.2–1.0)
pH, UA: 5 (ref 5.0–7.5)

## 2021-08-03 LAB — MICROSCOPIC EXAMINATION
Bacteria, UA: NONE SEEN
Renal Epithel, UA: NONE SEEN /hpf
WBC, UA: NONE SEEN /hpf (ref 0–5)

## 2021-08-03 MED ORDER — CIPROFLOXACIN HCL 500 MG PO TABS
500.0000 mg | ORAL_TABLET | Freq: Once | ORAL | Status: AC
Start: 2021-08-03 — End: 2021-08-03
  Administered 2021-08-03: 500 mg via ORAL

## 2021-08-03 NOTE — Progress Notes (Signed)
Subjective:  1. Gross hematuria   2. BPH with urinary obstruction   3. Urinary frequency     Yvone Neu returns in f/u.  He had a cystolithalopaxy on 10/29/19 for a 2.6cm stone.  He is doing well without hematuria and minimal LUTS with an IPSS of 1.    He has some BPH with BOO and has had a good response to tamsulosin in the past but his LUTS haven't been too severe.    Pt is a 71YO male with h/o renal and bladder stones who presents with c/o 4 day h/o left sided abdominal pain radiating to the lower abdomen, gross hematuria and passage of clots. No dysuria, burning. Pt denies fever, chills, back pain, nausea, vomiting. Pt on ASA daily. Otherwise, has been doing well with LUTS on Flomax daily. Pt also requests change of Rxs as tamsolosin will become tier 2 with his new insurance. IPPS score=4 PVR=5ml  CT shows stable renal stones with the largest in the [facility] about 7-2mm.  No hydro is noted.  He has prostate enlargement but no obvious bladder lesions.  There is some interval enlargement in the AAA.    08/03/21: Yvone Neu returns today in f/u for cystoscopy for further evaluation of recent gross hematuria.   He has been on finasteride with the tamsulosin as the bleeding was presumed to be prostatic pending the cystoscopy.         ROS:  ROS:  A complete review of systems was performed.  All systems are negative except for pertinent findings as noted.   ROS  Allergies  Allergen Reactions   Meperidine Hcl Other (See Comments)    Was mixed with phenergan and turned blood vessels red.    Promethazine Hcl Other (See Comments)    Was mixed with the Demerol and caused veins to turn red.     Outpatient Encounter Medications as of 08/03/2021  Medication Sig   acetaminophen (TYLENOL) 500 MG tablet Take 500 mg by mouth at bedtime.   aspirin EC 81 MG tablet Take 1 tablet (81 mg total) by mouth daily.   atorvastatin (LIPITOR) 40 MG tablet TAKE 1 TABLET BY MOUTH EVERY DAY   cetirizine (ZYRTEC) 10 MG tablet  Take 10 mg by mouth daily.   finasteride (PROSCAR) 5 MG tablet Take 1 tablet (5 mg total) by mouth daily.   fluticasone (FLONASE) 50 MCG/ACT nasal spray Place 1 spray into both nostrils daily.   metFORMIN (GLUCOPHAGE) 1000 MG tablet Take 1,000 mg by mouth 2 (two) times daily.   metoprolol tartrate (LOPRESSOR) 25 MG tablet TAKE 1 TABLET BY MOUTH TWICE A DAY   nicotine (NICODERM CQ - DOSED IN MG/24 HOURS) 14 mg/24hr patch Place 1 patch (14 mg total) onto the skin daily.   ONETOUCH ULTRA test strip USE TO TEST SUGAR THREE TIMES DAILY   ramipril (ALTACE) 10 MG capsule TAKE 2 CAPSULES BY MOUTH EVERY DAY WITH BREAKFAST (Patient taking differently: Take 20 mg by mouth daily.)   saccharomyces boulardii (FLORASTOR) 250 MG capsule Take 1 capsule (250 mg total) by mouth 2 (two) times daily.   tamsulosin (FLOMAX) 0.4 MG CAPS capsule TAKE 1 CAPSULE BY MOUTH EVERY DAY   [EXPIRED] ciprofloxacin (CIPRO) tablet 500 mg    No facility-administered encounter medications on file as of 08/03/2021.    Past Medical History:  Diagnosis Date   AAA (abdominal aortic aneurysm)    3.3 cm in 2012   ABDOMINAL AORTIC ANEURYSM 11/07/2009   Diameter of 3.3 cm by CT  in 12/2010    Anxiety    ASCVD (arteriosclerotic cardiovascular disease)    LAD stent in 1999; RCA bare-metal stent in 12/2007, LAD stent patent, 50-60% proximal Cx lesion treated medically; 09/2012: CABG x3, LIMA to LAD, Sequential SVG to ramus intermediate and OM branch with EVH via right thigh   Coronary artery disease    Depression    Diabetes mellitus    DIABETES MELLITUS, TYPE II 08/09/2010   no insulin     ED (erectile dysfunction)    Gastric ulcer    Hepatic steatosis    Hyperlipidemia    HYPERLIPIDEMIA 08/09/2010   Lipid profile in 07/2010:144, 124, 34, 86; 01/2012:93, 27, 23, 43    Hypertension    Insomnia    MI (myocardial infarction) ([place])    Nephrolithiasis    NEPHROLITHIASIS, RECURRENT 08/09/2010   Obstruction of the ureter on CT scan of the  abdomen resulting in right hydronephrosis in 12/2010; bladder outlet obstruction prior to that.  CT also revealed atherosclerotic changes in the abdominal arterial vessels, hepatic granuloma and calcification of the prostate.    S/P CABG x 3 10/07/2012   LIMA to LAD, Sequential SVG to ramus intermediate and OM branch with EVH via right thigh   THROMBOCYTOPENIA 08/09/2010   Platelets 88-114,000 over the past 4 years; prior evaluation by Dr. Tressie Stalker without a specific etiology identified.  01/2012: Normal CBC ex platelets-120    Thrombocytopenia ([place])    Chronic; evaluated by Dr. Tressie Stalker in 2007   Tobacco abuse     Past Surgical History:  Procedure Laterality Date   COLONOSCOPY N/A 01/19/2021   Procedure: COLONOSCOPY;  Surgeon: Rogene Houston, MD;  [facility];  Service: Endoscopy;  Laterality: N/A;  12:30   CORONARY ANGIOPLASTY WITH STENT PLACEMENT     1999, 2009   CORONARY ARTERY BYPASS GRAFT N/A 10/07/2012   Procedure: CORONARY ARTERY BYPASS GRAFTING (CABG);  Surgeon: Rexene Alberts, MD;  Location: [facility];  Service: Open Heart Surgery;  Laterality: N/A;   CYSTOSCOPY WITH LITHOLAPAXY N/A 10/29/2019   Procedure: CYSTOSCOPY WITH LITHOLAPAXY;  Surgeon: Irine Seal, MD;  [facility];  Service: Urology;  Laterality: N/A;   CYSTOSCOPY WITH RETROGRADE PYELOGRAM, URETEROSCOPY AND STENT PLACEMENT Left 08/30/2015   Procedure: CYSTOSCOPY LEFT RETROGRADE PYELOGRAM  LEFT   URETEROSCOPY,  LITHOLAPAXY;  Surgeon: Irine Seal, MD;  [facility];  Service: Urology;  Laterality: Left;   ESOPHAGOGASTRODUODENOSCOPY  04/04/2012   Procedure: ESOPHAGOGASTRODUODENOSCOPY (EGD);  Surgeon: Rogene Houston, MD;  [facility];  Service: Endoscopy;  Laterality: N/A;  830   ESOPHAGOGASTRODUODENOSCOPY  07/25/2012   Procedure: ESOPHAGOGASTRODUODENOSCOPY (EGD);  Surgeon: Rogene Houston, MD;  [facility];  Service: Endoscopy;  Laterality: N/A;  830   HOLMIUM LASER APPLICATION Left  09/27/9240   Procedure: HOLMIUM LASER APPLICATION;  Surgeon: Irine Seal, MD;  [facility];  Service: Urology;  Laterality: Left;   INGUINAL HERNIA REPAIR     INTRAOPERATIVE TRANSESOPHAGEAL ECHOCARDIOGRAM N/A 10/07/2012   Procedure: INTRAOPERATIVE TRANSESOPHAGEAL ECHOCARDIOGRAM;  Surgeon: Rexene Alberts, MD;  Location: [facility];  Service: Open Heart Surgery;  Laterality: N/A;   LEFT HEART CATHETERIZATION WITH CORONARY ANGIOGRAM N/A 10/03/2012   Procedure: LEFT HEART CATHETERIZATION WITH CORONARY ANGIOGRAM;  Surgeon: Minus Breeding, MD;  Location: [facility];  Service: Cardiovascular;  Laterality: N/A;   LITHOTRIPSY     POLYPECTOMY  01/19/2021   Procedure: POLYPECTOMY;  Surgeon: Rogene Houston, MD;  [facility];  Service: Endoscopy;;    Social History   Socioeconomic History   Marital status: Married    Spouse name: Not on file   Number of children: Not on file   Years of education: Not on file   Highest education level: Not on file  Occupational History   Not on file  Tobacco Use   Smoking status: Every Day    Packs/day: 0.50    Years: 40.00    Pack years: 20.00    Types: Cigarettes    Last attempt to quit: 07/09/2013    Years since quitting: 8.0   Smokeless tobacco: Never   Tobacco comments:    6-7 cigs. daily   Vaping Use   Vaping Use: Never used  Substance and Sexual Activity   Alcohol use: No    Alcohol/week: 0.0 standard drinks   Drug use: No   Sexual activity: Not on file  Other Topics Concern   Not on file  Social History Narrative   No regular exercise   Social Determinants of Health   Financial Resource Strain: Not on file  Food Insecurity: Not on file  Transportation Needs: Not on file  Physical Activity: Not on file  Stress: Not on file  Social Connections: Not on file  Intimate Partner Violence: Not on file    Family History  Problem Relation Age of Onset   Diabetes Mother    Ovarian cancer Sister        Objective: Vitals:    08/03/21 0858  BP: (!) 169/70  Pulse: 73     Physical Exam  Lab Results:  Results for orders placed or performed in visit on 08/03/21 (from the past 24 hour(s))  Urinalysis, Routine w reflex microscopic     Status: Abnormal   Collection Time: 08/03/21 11:04 AM  Result Value Ref Range   Specific Gravity, UA 1.010 1.005 - 1.030   pH, UA 5.0 5.0 - 7.5   Color, UA Yellow Yellow   Appearance Ur Clear Clear   Leukocytes,UA Negative Negative   Protein,UA 1+ (A) Negative/Trace   Glucose, UA Negative Negative   Ketones, UA Negative Negative   RBC, UA Trace (A) Negative   Bilirubin, UA Negative Negative   Urobilinogen, Ur 0.2 0.2 - 1.0 mg/dL   Nitrite, UA Negative Negative   Microscopic Examination See below:    Narrative   Performed at:  [address] - [place] [address] [place], [place]  [id number] Lab Director: Mina Marble MT, Phone:  [phone]  Microscopic Examination     Status: None   Collection Time: 08/03/21 11:04 AM   Urine  Result Value Ref Range   WBC, UA None seen 0 - 5 /hpf   RBC 0-2 0 - 2 /hpf   Epithelial Cells (non renal) 0-10 0 - 10 /hpf   Renal Epithel, UA None seen None seen /hpf   Mucus, UA Present Not Estab.   Bacteria, UA None seen None seen/Few   Narrative   Performed at:  [facility] [address] [place], [place]  [id number] Lab Director: [place], Phone:  [phone]      BMET No results for input(s): NA, K, CL, CO2, GLUCOSE, BUN, CREATININE, CALCIUM in the last 72 hours. PSA No results found for: PSA No results found for: TESTOSTERONE    Studies/Results: Procedure: Flexible cystoscopy.  The risks were reviewed in detail.   He was prepped with betadine and 2% lidocaine jelly.  Cystoscopy was performed with the  flexible scope.  The urethra is normal.  The prostate has bilobar hyperplasia with some intravesical protrusion.  There is coaptation with some obstruction.  There are a few prominent veins at the  bladder neck.  No tumors or inflammation is noted.  There is mild trabeculation.  There was some very fine suspended grit noted.  UO's were normal.    He was given Cipro 500mg  to cover the procedure.    Assessment & Plan:  Gross hematuria  that has resolved but was probably prostatic in origin.  BPH with BOO.  He will stay on tamsulosin and finasteride.  F/U in 6 months with a PSA.    Renal stones without obstruction or significant growth.  I will consider f/u imaging in about a year.    Meds ordered this encounter  Medications   ciprofloxacin (CIPRO) tablet 500 mg      Orders Placed This Encounter  Procedures   Microscopic Examination   Urinalysis, Routine w reflex microscopic   PSA, total and free    Standing Status:   Future    Standing Expiration Date:   08/03/2022       Return in about 6 months (around 01/31/2022) for with PSA.   CC: Redmond School, MD      Irine Seal 08/03/2021 Patient ID: Tawanna Sat, male   DOB: 12-Apr-1951, 71 y.o.   MRN: [id number] Patient ID: TALBOT MONARCH, male   DOB: 1950/12/11, 71 y.o.   MRN: [id number] Patient ID: ALPHONSA BRICKLE, male   DOB: Aug 14, 1950, 71 y.o.   MRN: [id number]

## 2021-08-03 NOTE — Progress Notes (Signed)
Urological Symptom Review  Patient is experiencing the following symptoms: Erection problems (male only)   Review of Systems  Gastrointestinal (upper)  : Negative for upper GI symptoms  Gastrointestinal (lower) : Negative for lower GI symptoms  Constitutional : Negative for symptoms  Skin: Negative for skin symptoms  Eyes: Negative for eye symptoms  Ear/Nose/Throat : Negative for Ear/Nose/Throat symptoms  Hematologic/Lymphatic: Easy bruising  Cardiovascular : Negative for cardiovascular symptoms  Respiratory : Negative for respiratory symptoms  Endocrine: Negative for endocrine symptoms  Musculoskeletal: Negative for musculoskeletal symptoms  Neurological: Negative for neurological symptoms  Psychologic: Negative for psychiatric symptoms

## 2021-08-04 NOTE — Telephone Encounter (Signed)
Pt notified that we are able to use existing CT results.

## 2021-08-20 ENCOUNTER — Ambulatory Visit
Admission: EM | Admit: 2021-08-20 | Discharge: 2021-08-20 | Disposition: A | Discharge status: Home / Self Care | Payer: Medicare HMO | Attending: Urgent Care | Admitting: Urgent Care

## 2021-08-20 ENCOUNTER — Other Ambulatory Visit: Payer: Self-pay | Admitting: Cardiology

## 2021-08-20 ENCOUNTER — Other Ambulatory Visit: Payer: Self-pay

## 2021-08-20 ENCOUNTER — Encounter: Payer: Self-pay | Admitting: Emergency Medicine

## 2021-08-20 DIAGNOSIS — J069 Acute upper respiratory infection, unspecified: Secondary | ICD-10-CM | POA: Diagnosis not present

## 2021-08-20 DIAGNOSIS — J309 Allergic rhinitis, unspecified: Secondary | ICD-10-CM | POA: Diagnosis not present

## 2021-08-20 DIAGNOSIS — R001 Bradycardia, unspecified: Secondary | ICD-10-CM | POA: Diagnosis not present

## 2021-08-20 DIAGNOSIS — R9431 Abnormal electrocardiogram [ECG] [EKG]: Secondary | ICD-10-CM

## 2021-08-20 DIAGNOSIS — F17200 Nicotine dependence, unspecified, uncomplicated: Secondary | ICD-10-CM | POA: Diagnosis not present

## 2021-08-20 DIAGNOSIS — R0981 Nasal congestion: Secondary | ICD-10-CM | POA: Diagnosis not present

## 2021-08-20 DIAGNOSIS — R69 Illness, unspecified: Secondary | ICD-10-CM | POA: Diagnosis not present

## 2021-08-20 MED ORDER — PREDNISONE 20 MG PO TABS: 20.0000 mg | ORAL_TABLET | Freq: Every day | ORAL | 0 refills | Status: DC

## 2021-08-20 MED ORDER — LEVOCETIRIZINE DIHYDROCHLORIDE 5 MG PO TABS: 5.0000 mg | ORAL_TABLET | Freq: Every evening | ORAL | 0 refills | Status: AC

## 2021-08-20 MED ORDER — IPRATROPIUM BROMIDE 0.03 % NA SOLN: 2.0000 | Freq: Two times a day (BID) | NASAL | 0 refills | Status: DC

## 2021-08-20 NOTE — ED Provider Notes (Signed)
[facility]   MRN: [id number] DOB: 16-Aug-1950  Subjective:   [name] is a 71 y.o. male presenting for 2-day history of acute onset recurrent sinus pressure, sinus congestion.  Has a history of allergic rhinitis, takes Claritin daily.  No fever, body aches, ear pain, throat pain, chest pain, shortness of breath, wheezing.  Smokes 1/2ppd.  No history of respiratory disorders.  Patient does see a cardiologist as he has ASCVD and history of abnormal EKGs.  Has had full cardiac work-up.  He also has a history of hypertension, abdominal aortic aneurysm, type 2 diabetes treated without insulin.  Blood sugars are well controlled.  He did a COVID test at home and was negative.  No current facility-administered medications for this encounter.  Current Outpatient Medications:    acetaminophen (TYLENOL) 500 MG tablet, Take 500 mg by mouth at bedtime., Disp: , Rfl:    aspirin EC 81 MG tablet, Take 1 tablet (81 mg total) by mouth daily., Disp: 30 tablet, Rfl: 11   atorvastatin (LIPITOR) 40 MG tablet, TAKE 1 TABLET BY MOUTH EVERY DAY, Disp: 90 tablet, Rfl: 1   cetirizine (ZYRTEC) 10 MG tablet, Take 10 mg by mouth daily., Disp: , Rfl:    finasteride (PROSCAR) 5 MG tablet, Take 1 tablet (5 mg total) by mouth daily., Disp: 90 tablet, Rfl: 3   fluticasone (FLONASE) 50 MCG/ACT nasal spray, Place 1 spray into both nostrils daily., Disp: 16 g, Rfl: 0   metFORMIN (GLUCOPHAGE) 1000 MG tablet, Take 1,000 mg by mouth 2 (two) times daily., Disp: , Rfl:    metoprolol tartrate (LOPRESSOR) 25 MG tablet, TAKE 1 TABLET BY MOUTH TWICE A DAY, Disp: 180 tablet, Rfl: 0   nicotine (NICODERM CQ - DOSED IN MG/24 HOURS) 14 mg/24hr patch, Place 1 patch (14 mg total) onto the skin daily., Disp: 28 patch, Rfl: 0   ONETOUCH ULTRA test strip, USE TO TEST SUGAR THREE TIMES DAILY, Disp: , Rfl:    ramipril (ALTACE) 10 MG capsule, TAKE 2 CAPSULES BY MOUTH EVERY DAY WITH BREAKFAST (Patient taking differently: Take 20  mg by mouth daily.), Disp: 180 capsule, Rfl: 3   saccharomyces boulardii (FLORASTOR) 250 MG capsule, Take 1 capsule (250 mg total) by mouth 2 (two) times daily., Disp: 20 capsule, Rfl: 0   tamsulosin (FLOMAX) 0.4 MG CAPS capsule, TAKE 1 CAPSULE BY MOUTH EVERY DAY, Disp: 90 capsule, Rfl: 3   Allergies  Allergen Reactions   Meperidine Hcl Other (See Comments)    Was mixed with phenergan and turned blood vessels red.    Promethazine Hcl Other (See Comments)    Was mixed with the Demerol and caused veins to turn red.     Past Medical History:  Diagnosis Date   AAA (abdominal aortic aneurysm)    3.3 cm in 2012   ABDOMINAL AORTIC ANEURYSM 11/07/2009   Diameter of 3.3 cm by CT in 12/2010    Anxiety    ASCVD (arteriosclerotic cardiovascular disease)    LAD stent in 1999; RCA bare-metal stent in 12/2007, LAD stent patent, 50-60% proximal Cx lesion treated medically; 09/2012: CABG x3, LIMA to LAD, Sequential SVG to ramus intermediate and OM branch with EVH via right thigh   Coronary artery disease    Depression    Diabetes mellitus    DIABETES MELLITUS, TYPE II 08/09/2010   no insulin     ED (erectile dysfunction)    Gastric ulcer    Hepatic steatosis    Hyperlipidemia  HYPERLIPIDEMIA 08/09/2010   Lipid profile in 07/2010:144, 124, 34, 86; 01/2012:93, 82, 34, 43    Hypertension    Insomnia    MI (myocardial infarction) ([place])    Nephrolithiasis    NEPHROLITHIASIS, RECURRENT 08/09/2010   Obstruction of the ureter on CT scan of the abdomen resulting in right hydronephrosis in 12/2010; bladder outlet obstruction prior to that.  CT also revealed atherosclerotic changes in the abdominal arterial vessels, hepatic granuloma and calcification of the prostate.    S/P CABG x 3 10/07/2012   LIMA to LAD, Sequential SVG to ramus intermediate and OM branch with EVH via right thigh   THROMBOCYTOPENIA 08/09/2010   Platelets 88-114,000 over the past 4 years; prior evaluation by Dr. Tressie Stalker without a  specific etiology identified.  01/2012: Normal CBC ex platelets-120    Thrombocytopenia ([place])    Chronic; evaluated by Dr. Tressie Stalker in 2007   Tobacco abuse      Past Surgical History:  Procedure Laterality Date   COLONOSCOPY N/A 01/19/2021   Procedure: COLONOSCOPY;  Surgeon: Rogene Houston, MD;  [facility];  Service: Endoscopy;  Laterality: N/A;  12:30   CORONARY ANGIOPLASTY WITH STENT PLACEMENT     1999, 2009   CORONARY ARTERY BYPASS GRAFT N/A 10/07/2012   Procedure: CORONARY ARTERY BYPASS GRAFTING (CABG);  Surgeon: Rexene Alberts, MD;  Location: [facility];  Service: Open Heart Surgery;  Laterality: N/A;   CYSTOSCOPY WITH LITHOLAPAXY N/A 10/29/2019   Procedure: CYSTOSCOPY WITH LITHOLAPAXY;  Surgeon: Irine Seal, MD;  [facility];  Service: Urology;  Laterality: N/A;   CYSTOSCOPY WITH RETROGRADE PYELOGRAM, URETEROSCOPY AND STENT PLACEMENT Left 08/30/2015   Procedure: CYSTOSCOPY LEFT RETROGRADE PYELOGRAM  LEFT   URETEROSCOPY,  LITHOLAPAXY;  Surgeon: Irine Seal, MD;  [facility];  Service: Urology;  Laterality: Left;   ESOPHAGOGASTRODUODENOSCOPY  04/04/2012   Procedure: ESOPHAGOGASTRODUODENOSCOPY (EGD);  Surgeon: Rogene Houston, MD;  [facility];  Service: Endoscopy;  Laterality: N/A;  830   ESOPHAGOGASTRODUODENOSCOPY  07/25/2012   Procedure: ESOPHAGOGASTRODUODENOSCOPY (EGD);  Surgeon: Rogene Houston, MD;  [facility];  Service: Endoscopy;  Laterality: N/A;  830   HOLMIUM LASER APPLICATION Left 0/17/4944   Procedure: HOLMIUM LASER APPLICATION;  Surgeon: Irine Seal, MD;  [facility];  Service: Urology;  Laterality: Left;   INGUINAL HERNIA REPAIR     INTRAOPERATIVE TRANSESOPHAGEAL ECHOCARDIOGRAM N/A 10/07/2012   Procedure: INTRAOPERATIVE TRANSESOPHAGEAL ECHOCARDIOGRAM;  Surgeon: Rexene Alberts, MD;  Location: [facility];  Service: Open Heart Surgery;  Laterality: N/A;   LEFT HEART CATHETERIZATION WITH CORONARY ANGIOGRAM N/A 10/03/2012   Procedure: LEFT  HEART CATHETERIZATION WITH CORONARY ANGIOGRAM;  Surgeon: Minus Breeding, MD;  Location[facility];  Service: Cardiovascular;  Laterality: N/A;   LITHOTRIPSY     POLYPECTOMY  01/19/2021   Procedure: POLYPECTOMY;  Surgeon: Rogene Houston, MD;  [facility];  Service: Endoscopy;;    Family History  Problem Relation Age of Onset   Diabetes Mother    Ovarian cancer Sister     Social History   Tobacco Use   Smoking status: Every Day    Packs/day: 0.50    Years: 40.00    Pack years: 20.00    Types: Cigarettes    Last attempt to quit: 07/09/2013    Years since quitting: 8.1   Smokeless tobacco: Never   Tobacco comments:    6-7 cigs. daily   Vaping Use   Vaping Use: Never used  Substance Use Topics   Alcohol use: No    Alcohol/week: 0.0 standard drinks   Drug use: No    ROS   Objective:   Vitals: BP (!) 169/76 (BP Location: Right Arm)    Pulse (!) 35    Temp 98 F (36.7 C) (Oral)    Resp 18    SpO2 97%   Physical Exam Constitutional:      General: He is not in acute distress.    Appearance: Normal appearance. He is well-developed and normal weight. He is not ill-appearing, toxic-appearing or diaphoretic.  HENT:     Head: Normocephalic and atraumatic.     Right Ear: Tympanic membrane, ear canal and external ear normal. There is no impacted cerumen.     Left Ear: Tympanic membrane, ear canal and external ear normal. There is no impacted cerumen.     Nose: Congestion present. No rhinorrhea.     Mouth/Throat:     Mouth: Mucous membranes are moist.     Pharynx: No oropharyngeal exudate or posterior oropharyngeal erythema.  Eyes:     General: No scleral icterus.       Right eye: No discharge.        Left eye: No discharge.     Extraocular Movements: Extraocular movements intact.     Conjunctiva/sclera: Conjunctivae normal.  Cardiovascular:     Rate and Rhythm: Normal rate and regular rhythm.     Heart sounds: Normal heart sounds. No murmur heard.   No  friction rub. No gallop.  Pulmonary:     Effort: Pulmonary effort is normal. No respiratory distress.     Breath sounds: Normal breath sounds. No stridor. No wheezing, rhonchi or rales.  Musculoskeletal:     Cervical back: Normal range of motion and neck supple. No rigidity. No muscular tenderness.  Neurological:     General: No focal deficit present.     Mental Status: He is alert and oriented to person, place, and time.  Psychiatric:        Mood and Affect: Mood normal.        Behavior: Behavior normal.        Thought Content: Thought content normal.   ED ECG REPORT   Date: 08/20/2021  EKG Time: 8:22 AM  Rate: 70bpm  Rhythm: normal sinus rhythm and premature ventricular contractions (PVC),  normal EKG, normal sinus rhythm, unchanged from previous tracings  Axis: Normal  Intervals:none  ST&T Change: T wave inversion in lead II, III, aVF  Narrative Interpretation: Sinus rhythm at 70 bpm with frequent PVCs, criteria for LVH and T wave changes as above.  Very comparable to previous EKG from 03/15/2021.   Assessment and Plan :   PDMP not reviewed this encounter.  1. Viral upper respiratory illness   2. Sinus congestion   3. Allergic rhinitis, unspecified seasonality, unspecified trigger   4. Smoker   5. Nonspecific abnormal electrocardiogram (ECG) (EKG)    Patient declined COVID-19 test. Deferred imaging given clear cardiopulmonary exam, hemodynamically stable vital signs.  EKG without acute changes, signs of ACS.  Recommended very close follow-up with his PCP.  Use supportive care otherwise for a viral upper respiratory infection.  Switch from Claritin to [place] patient on potential for adverse effects with medications prescribed/recommended today, ER and return-to-clinic precautions discussed, patient verbalized understanding.    Jaynee Eagles, [place] 08/20/21 [id number]

## 2021-08-20 NOTE — ED Triage Notes (Signed)
Nasal congestion since Friday, no other symptoms.

## 2021-08-21 ENCOUNTER — Telehealth: Payer: Self-pay | Admitting: Cardiology

## 2021-08-21 NOTE — Telephone Encounter (Signed)
Patient is requesting a transfer from Dr. Harl Bowie to Dr. Johnsie Cancel in [place]. Please confirm request.

## 2021-09-12 ENCOUNTER — Ambulatory Visit: Payer: Medicare HMO | Admitting: Medical

## 2021-11-22 ENCOUNTER — Emergency Department (HOSPITAL_COMMUNITY)
Admission: EM | Admit: 2021-11-22 | Discharge: 2021-11-22 | Disposition: A | Discharge status: Home / Self Care | Payer: Medicare HMO | Attending: Emergency Medicine | Admitting: Emergency Medicine

## 2021-11-22 ENCOUNTER — Emergency Department (HOSPITAL_COMMUNITY): Payer: Medicare HMO

## 2021-11-22 ENCOUNTER — Encounter (HOSPITAL_COMMUNITY): Payer: Self-pay | Admitting: Emergency Medicine

## 2021-11-22 DIAGNOSIS — E119 Type 2 diabetes mellitus without complications: Secondary | ICD-10-CM | POA: Diagnosis not present

## 2021-11-22 DIAGNOSIS — J36 Peritonsillar abscess: Secondary | ICD-10-CM | POA: Insufficient documentation

## 2021-11-22 DIAGNOSIS — J029 Acute pharyngitis, unspecified: Secondary | ICD-10-CM | POA: Diagnosis not present

## 2021-11-22 DIAGNOSIS — I10 Essential (primary) hypertension: Secondary | ICD-10-CM | POA: Diagnosis not present

## 2021-11-22 DIAGNOSIS — I2510 Atherosclerotic heart disease of native coronary artery without angina pectoris: Secondary | ICD-10-CM | POA: Insufficient documentation

## 2021-11-22 DIAGNOSIS — J039 Acute tonsillitis, unspecified: Secondary | ICD-10-CM | POA: Diagnosis not present

## 2021-11-22 LAB — CBC WITH DIFFERENTIAL/PLATELET
Abs Immature Granulocytes: 0.03 10*3/uL (ref 0.00–0.07)
Basophils Absolute: 0.1 10*3/uL (ref 0.0–0.1)
Basophils Relative: 1 %
Eosinophils Absolute: 0.4 10*3/uL (ref 0.0–0.5)
Eosinophils Relative: 4 %
HCT: 45 % (ref 39.0–52.0)
Hemoglobin: 14.7 g/dL (ref 13.0–17.0)
Immature Granulocytes: 0 %
Lymphocytes Relative: 17 %
Lymphs Abs: 1.8 10*3/uL (ref 0.7–4.0)
MCH: 29.5 pg (ref 26.0–34.0)
MCHC: 32.7 g/dL (ref 30.0–36.0)
MCV: 90.2 fL (ref 80.0–100.0)
Monocytes Absolute: 1 10*3/uL (ref 0.1–1.0)
Monocytes Relative: 9 %
Neutro Abs: 7.5 10*3/uL (ref 1.7–7.7)
Neutrophils Relative %: 69 %
Platelets: 102 10*3/uL — ABNORMAL LOW (ref 150–400)
RBC: 4.99 MIL/uL (ref 4.22–5.81)
RDW: 13.6 % (ref 11.5–15.5)
WBC: 10.8 10*3/uL — ABNORMAL HIGH (ref 4.0–10.5)
nRBC: 0 % (ref 0.0–0.2)

## 2021-11-22 LAB — BASIC METABOLIC PANEL
Anion gap: 5 (ref 5–15)
BUN: 15 mg/dL (ref 8–23)
CO2: 23 mmol/L (ref 22–32)
Calcium: 9 mg/dL (ref 8.9–10.3)
Chloride: 109 mmol/L (ref 98–111)
Creatinine, Ser: 1.05 mg/dL (ref 0.61–1.24)
GFR, Estimated: >60 mL/min (ref >60)
Glucose, Bld: 135 mg/dL — ABNORMAL HIGH (ref 70–99)
Potassium: 4.2 mmol/L (ref 3.5–5.1)
Sodium: 137 mmol/L (ref 135–145)

## 2021-11-22 LAB — GROUP A STREP BY PCR: Group A Strep by PCR: NOT DETECTED

## 2021-11-22 MED ORDER — SODIUM CHLORIDE 0.9 % IV SOLN
1.5000 g | Freq: Four times a day (QID) | INTRAVENOUS | Status: DC
  Administered 2021-11-22: 1.5 g via INTRAVENOUS
  Filled 2021-11-22: qty 4

## 2021-11-22 MED ORDER — IOHEXOL 300 MG/ML  SOLN
75.0000 mL | Freq: Once | INTRAMUSCULAR | Status: AC | PRN
  Administered 2021-11-22: 75 mL via INTRAVENOUS

## 2021-11-22 MED ORDER — AMOXICILLIN-POT CLAVULANATE 875-125 MG PO TABS: 1.0000 | ORAL_TABLET | Freq: Two times a day (BID) | ORAL | 0 refills | Status: DC

## 2021-11-22 MED ORDER — DEXAMETHASONE SODIUM PHOSPHATE 10 MG/ML IJ SOLN
10.0000 mg | Freq: Once | INTRAMUSCULAR | Status: AC
  Administered 2021-11-22: 10 mg via INTRAVENOUS
  Filled 2021-11-22: qty 1

## 2021-11-22 NOTE — ED Triage Notes (Signed)
Pt c/o sore throat for the past 3 days.

## 2021-11-22 NOTE — ED Provider Notes (Signed)
[facility] EMERGENCY DEPARTMENT Provider Note   CSN: [id number] Arrival date & time: 11/22/21  0500     History  Chief Complaint  Patient presents with   Sore Throat    [name] is a 71 y.o. male.  HPI     This is a 71 year old male with a history of diabetes who presents with sore throat.  Patient reports 2 to 3-day history of worsening sore throat.  He states that he has not had any other upper respiratory symptoms such as cough or nasal congestion.  No fevers.  He states that he woke up this morning with worsening sore throat and feeling like his throat was closing up.  He did take a hydrocodone with minimal relief.  States that his daughter also had a sore throat.  Home Medications Prior to Admission medications   Medication Sig Start Date End Date Taking? Authorizing Provider  amoxicillin-clavulanate (AUGMENTIN) 875-125 MG tablet Take 1 tablet by mouth every 12 (twelve) hours. 11/22/21  Yes Preslynn Bier, Barbette Hair, MD  acetaminophen (TYLENOL) 500 MG tablet Take 500 mg by mouth at bedtime.    [provider]  aspirin EC 81 MG tablet Take 1 tablet (81 mg total) by mouth daily. 01/20/21   Rogene Houston, MD  atorvastatin (LIPITOR) 40 MG tablet TAKE 1 TABLET BY MOUTH EVERY DAY 06/28/21   Arnoldo Lenis, MD  cetirizine (ZYRTEC) 10 MG tablet Take 10 mg by mouth daily.    [provider]  finasteride (PROSCAR) 5 MG tablet Take 1 tablet (5 mg total) by mouth daily. 06/15/21   Irine Seal, MD  fluticasone (FLONASE) 50 MCG/ACT nasal spray Place 1 spray into both nostrils daily. 03/19/21   Barton Dubois, MD  ipratropium (ATROVENT) 0.03 % nasal spray Place 2 sprays into both nostrils 2 (two) times daily. 08/20/21   Jaynee Eagles, PA-C  levocetirizine (XYZAL) 5 MG tablet Take 1 tablet (5 mg total) by mouth every evening. 08/20/21   Jaynee Eagles, PA-C  metFORMIN (GLUCOPHAGE) 1000 MG tablet Take 1,000 mg by mouth 2 (two) times daily.    [provider]   metoprolol tartrate (LOPRESSOR) 25 MG tablet TAKE 1 TABLET BY MOUTH TWICE A DAY 08/21/21   Arnoldo Lenis, MD  nicotine (NICODERM CQ - DOSED IN MG/24 HOURS) 14 mg/24hr patch Place 1 patch (14 mg total) onto the skin daily. 03/19/21   Barton Dubois, MD  [facility] ULTRA test strip USE TO TEST SUGAR THREE TIMES DAILY 12/21/20   [provider]  predniSONE (DELTASONE) 20 MG tablet Take 1 tablet (20 mg total) by mouth daily with breakfast. 08/20/21   Jaynee Eagles, PA-C  ramipril (ALTACE) 10 MG capsule TAKE 2 CAPSULES BY MOUTH EVERY DAY WITH BREAKFAST Patient taking differently: Take 20 mg by mouth daily. 03/14/21   Arnoldo Lenis, MD  saccharomyces boulardii (FLORASTOR) 250 MG capsule Take 1 capsule (250 mg total) by mouth 2 (two) times daily. 03/18/21   Barton Dubois, MD  tamsulosin (FLOMAX) 0.4 MG CAPS capsule TAKE 1 CAPSULE BY MOUTH EVERY DAY 07/06/21   Irine Seal, MD      Allergies    Meperidine hcl and Promethazine hcl    Review of Systems   Review of Systems  Constitutional:  Negative for fever.  HENT:  Positive for sore throat. Negative for congestion.   Respiratory:  Negative for cough.   All other systems reviewed and are negative.  Physical Exam Updated Vital Signs BP 140/89   Pulse Marland Kitchen)  49   Temp 98.7 F (37.1 C) (Oral)   Resp 18   Ht 1.803 m ('5\' 11"'$ )   Wt 75.3 kg   SpO2 97%   BMI 23.15 kg/m  Physical Exam Vitals and nursing note reviewed.  Constitutional:      Appearance: He is well-developed. He is not ill-appearing.     Comments: ABCs intact  HENT:     Head: Normocephalic and atraumatic.     Mouth/Throat:     Mouth: Mucous membranes are moist.     Tonsils: No tonsillar exudate. 2+ on the right. 1+ on the left.     Comments: Asymmetric swelling of the tonsils right greater than left with slight swelling of the soft palate on the right as well, uvula is midline, trismus Eyes:     Pupils: Pupils are equal, round, and reactive to light.  Cardiovascular:      Rate and Rhythm: Normal rate and regular rhythm.     Heart sounds: Normal heart sounds. No murmur heard. Pulmonary:     Effort: Pulmonary effort is normal. No respiratory distress.     Breath sounds: Normal breath sounds. No wheezing.  Abdominal:     General: Bowel sounds are normal.     Palpations: Abdomen is soft.     Tenderness: There is no abdominal tenderness. There is no rebound.  Musculoskeletal:     Cervical back: Neck supple.  Lymphadenopathy:     Cervical: Cervical adenopathy present.  Skin:    General: Skin is warm and dry.  Neurological:     Mental Status: He is alert and oriented to person, place, and time.  Psychiatric:        Mood and Affect: Mood normal.    ED Results / Procedures / Treatments   Labs (all labs ordered are listed, but only abnormal results are displayed) Labs Reviewed  CBC WITH DIFFERENTIAL/PLATELET - Abnormal; Notable for the following components:      Result Value   WBC 10.8 (*)    Platelets 102 (*)    All other components within normal limits  BASIC METABOLIC PANEL - Abnormal; Notable for the following components:   Glucose, Bld 135 (*)    All other components within normal limits  GROUP A STREP BY PCR    EKG None  Radiology CT Soft Tissue Neck W Contrast  Result Date: 11/22/2021 CLINICAL DATA:  Sore throat for 3 days EXAM: CT NECK WITH CONTRAST TECHNIQUE: Multidetector CT imaging of the neck was performed using the standard protocol following the bolus administration of intravenous contrast. RADIATION DOSE REDUCTION: This exam was performed according to the departmental dose-optimization program which includes automated exposure control, adjustment of the mA and/or kV according to patient size and/or use of iterative reconstruction technique. CONTRAST:  31m OMNIPAQUE IOHEXOL 300 MG/ML  SOLN COMPARISON:  None Available. FINDINGS: Pharynx and larynx: Palatine tonsil thickening asymmetric to the right where there is a peritonsillar  low-density collection measuring 1 cm. Contiguous submucosal edema involving the uvula/palate and continuing inferiorly into the supraglottic larynx. Salivary glands: No inflammation, mass, or stone. Thyroid: Normal. Lymph nodes: None enlarged or abnormal density. Vascular: Atheromatous calcification Limited intracranial: Negative Visualized orbits: Negative Mastoids and visualized paranasal sinuses: Clear Skeleton: No acute or aggressive finding Upper chest: Negative IMPRESSION: Tonsillitis with 1 cm right peritonsillar abscess. Contiguous pharyngitis and supraglottitis. Electronically Signed   By: JJorje GuildM.D.   On: 11/22/2021 06:49    Procedures Procedures    Medications Ordered in ED  Medications  ampicillin-sulbactam (UNASYN) 1.5 g in sodium chloride 0.9 % 100 mL IVPB (has no administration in time range)  dexamethasone (DECADRON) injection 10 mg (10 mg Intravenous Given 11/22/21 0554)  iohexol (OMNIPAQUE) 300 MG/ML solution 75 mL (75 mLs Intravenous Contrast Given 11/22/21 0631)    ED Course/ Medical Decision Making/ A&P                           Medical Decision Making Amount and/or Complexity of Data Reviewed Labs: ordered. Radiology: ordered.  Risk Prescription drug management.   This patient presents to the ED for concern of sore throat, this involves an extensive number of treatment options, and is a complaint that carries with it a high risk of complications and morbidity.  I considered the following differential and admission for this acute, potentially life threatening condition.  The differential diagnosis includes strep pharyngitis, viral pharyngitis, deep space infection such as peritonsillar abscess  MDM:    This is a 71 year old male who presents with sore throat.  He is nontoxic-appearing.  Vital signs are reassuring.  He has been afebrile.  Reports isolated sore throat.  He has evidence of asymmetric tonsillar and soft palate swelling on the right.  This makes  me suspicious for possible early PTA.  He is maintaining his airway.  He does have some slight trismus but no muffled voice.  Patient given Decadron.  Labs obtained.  Slight leukocytosis to 10.  CT obtained and shows a small 1 cm peritonsillar abscess and evidence of tonsillitis.  Patient was given a dose of IV Unasyn.  Given that he is generally well-appearing and afebrile with a relatively small abscess, do not feel emergent ENT evaluation is warranted.  Will place on antibiotics and have him follow-up in office.  Patient is agreeable to plan.  (Labs, imaging, consults)  Labs: I Ordered, and personally interpreted labs.  The pertinent results include: CBC, BMP  Imaging Studies ordered: I ordered imaging studies including CT with small peritonsillar abscess I independently visualized and interpreted imaging. I agree with the radiologist interpretation  Additional history obtained from chart review.  External records from outside source obtained and reviewed including prior evaluations  Cardiac Monitoring: The patient was maintained on a cardiac monitor.  I personally viewed and interpreted the cardiac monitored which showed an underlying rhythm of: Normal sinus rhythm  Reevaluation: After the interventions noted above, I reevaluated the patient and found that they have :improved  Social Determinants of Health: Lives independently  Disposition: Discharge  Co morbidities that complicate the patient evaluation  Past Medical History:  Diagnosis Date   AAA (abdominal aortic aneurysm) ([place])    3.3 cm in 2012   ABDOMINAL AORTIC ANEURYSM 11/07/2009   Diameter of 3.3 cm by CT in 12/2010    Anxiety    ASCVD (arteriosclerotic cardiovascular disease)    LAD stent in 1999; RCA bare-metal stent in 12/2007, LAD stent patent, 50-60% proximal Cx lesion treated medically; 09/2012: CABG x3, LIMA to LAD, Sequential SVG to ramus intermediate and OM branch with EVH via right thigh   Coronary artery  disease    Depression    Diabetes mellitus    DIABETES MELLITUS, TYPE II 08/09/2010   no insulin     ED (erectile dysfunction)    Gastric ulcer    Hepatic steatosis    Hyperlipidemia    HYPERLIPIDEMIA 08/09/2010   Lipid profile in 07/2010:144, 124, 34, 86; 01/2012:93, 82, 34, 43  Hypertension    Insomnia    MI (myocardial infarction) ([place])    Nephrolithiasis    NEPHROLITHIASIS, RECURRENT 08/09/2010   Obstruction of the ureter on CT scan of the abdomen resulting in right hydronephrosis in 12/2010; bladder outlet obstruction prior to that.  CT also revealed atherosclerotic changes in the abdominal arterial vessels, hepatic granuloma and calcification of the prostate.    S/P CABG x 3 10/07/2012   LIMA to LAD, Sequential SVG to ramus intermediate and OM branch with EVH via right thigh   THROMBOCYTOPENIA 08/09/2010   Platelets 88-114,000 over the past 4 years; prior evaluation by Dr. Tressie Stalker without a specific etiology identified.  01/2012: Normal CBC ex platelets-120    Thrombocytopenia ([place])    Chronic; evaluated by Dr. Tressie Stalker in 2007   Tobacco abuse      Medicines Meds ordered this encounter  Medications   dexamethasone (DECADRON) injection 10 mg   iohexol (OMNIPAQUE) 300 MG/ML solution 75 mL   ampicillin-sulbactam (UNASYN) 1.5 g in sodium chloride 0.9 % 100 mL IVPB    Order Specific Question:   Antibiotic Indication:    Answer:   Other Indication (list below)    Order Specific Question:   Other Indication:    Answer:   PTA   amoxicillin-clavulanate (AUGMENTIN) 875-125 MG tablet    Sig: Take 1 tablet by mouth every 12 (twelve) hours.    Dispense:  20 tablet    Refill:  0    I have reviewed the patients home medicines and have made adjustments as needed  Problem List / ED Course: Problem List Items Addressed This Visit   None Visit Diagnoses     Peritonsillar abscess    -  Primary                   Final Clinical Impression(s) / ED Diagnoses Final  diagnoses:  Peritonsillar abscess    Rx / DC Orders ED Discharge Orders          Ordered    amoxicillin-clavulanate (AUGMENTIN) 875-125 MG tablet  Every 12 hours        11/22/21 0707              Merryl Hacker, MD 11/22/21 [phone]

## 2021-11-22 NOTE — Discharge Instructions (Addendum)
You are seen today for sore throat.  You have evidence of a small peritonsillar abscess.  Take antibiotics as prescribed.  Continue hydrocodone for any pain.  Follow-up with ENT.  Call number provided for follow-up.  If you develop worsening sore throat, difficulty swallowing, fevers, you should be reevaluated.

## 2021-12-19 ENCOUNTER — Ambulatory Visit
Admission: EM | Admit: 2021-12-19 | Discharge: 2021-12-19 | Disposition: A | Discharge status: Home / Self Care | Payer: Medicare HMO | Attending: Nurse Practitioner | Admitting: Nurse Practitioner

## 2021-12-19 ENCOUNTER — Other Ambulatory Visit: Payer: Self-pay

## 2021-12-19 DIAGNOSIS — H6122 Impacted cerumen, left ear: Secondary | ICD-10-CM | POA: Diagnosis not present

## 2021-12-19 NOTE — ED Provider Notes (Signed)
[facility]    CSN: [id number] Arrival date & time: 12/19/21  6606      History   Chief Complaint Chief Complaint  Patient presents with   Ear Fullness    HPI [name] is a 71 y.o. male.   Patient present with 2-3 weeks of left ear fullness and decreased hearing from the left ear.  Denies any ear pain or drainage.  Reports a couple months ago, he had an abscess on his tonsil, however this has fully healed.  He denies any recent fevers, sore throat, cough or congestion.  He does not use Q-tips on a regular basis.  Reports he has had to have his ears flushed before.  Medical history significant for type 2 diabetes, hyperlipidemia, sinus rhythm with PVCs.  Upon obtaining vital signs, heart rate detected to be in the mid 30s.  EKG today shows heart rate is actually 80 bpm with frequent PVCs which is the patient's baseline.  Patient denies any chest pain, shortness of breath, palpitations.  He follows regularly with a cardiologist-has upcoming appointment in a couple of months.    Past Medical History:  Diagnosis Date   AAA (abdominal aortic aneurysm) ([place])    3.3 cm in 2012   ABDOMINAL AORTIC ANEURYSM 11/07/2009   Diameter of 3.3 cm by CT in 12/2010    Anxiety    ASCVD (arteriosclerotic cardiovascular disease)    LAD stent in 1999; RCA bare-metal stent in 12/2007, LAD stent patent, 50-60% proximal Cx lesion treated medically; 09/2012: CABG x3, LIMA to LAD, Sequential SVG to ramus intermediate and OM branch with EVH via right thigh   Coronary artery disease    Depression    Diabetes mellitus    DIABETES MELLITUS, TYPE II 08/09/2010   no insulin     ED (erectile dysfunction)    Gastric ulcer    Hepatic steatosis    Hyperlipidemia    HYPERLIPIDEMIA 08/09/2010   Lipid profile in 07/2010:144, 124, 34, 86; 01/2012:93, 72, 70, 43    Hypertension    Insomnia    MI (myocardial infarction) ([place])    Nephrolithiasis    NEPHROLITHIASIS, RECURRENT 08/09/2010    Obstruction of the ureter on CT scan of the abdomen resulting in right hydronephrosis in 12/2010; bladder outlet obstruction prior to that.  CT also revealed atherosclerotic changes in the abdominal arterial vessels, hepatic granuloma and calcification of the prostate.    S/P CABG x 3 10/07/2012   LIMA to LAD, Sequential SVG to ramus intermediate and OM branch with EVH via right thigh   THROMBOCYTOPENIA 08/09/2010   Platelets 88-114,000 over the past 4 years; prior evaluation by Dr. Tressie Stalker without a specific etiology identified.  01/2012: Normal CBC ex platelets-120    Thrombocytopenia ([place])    Chronic; evaluated by Dr. Tressie Stalker in 2007   Tobacco abuse     Patient Active Problem List   Diagnosis Date Noted   Left upper quadrant abdominal pain 06/15/2021   Gross hematuria 06/15/2021   BPH with urinary obstruction 06/15/2021   Weakness    CAP (community acquired pneumonia) 03/15/2021   Ureteral stone 07/09/2013   Hypertension 10/02/2012   Type 2 diabetes mellitus with hyperlipidemia ([place]) 08/09/2010   HYPERLIPIDEMIA 08/09/2010   THROMBOCYTOPENIA 08/09/2010   Arteriosclerotic cardiovascular disease (ASCVD) 08/09/2010   Renal stones 08/09/2010   ERECTILE DYSFUNCTION, ORGANIC 08/09/2010   INSOMNIA 08/09/2010   Abdominal aortic aneurysm ([place]) 11/07/2009    Past Surgical History:  Procedure Laterality Date  COLONOSCOPY N/A 01/19/2021   Procedure: COLONOSCOPY;  Surgeon: Rogene Houston, MD;  [facility];  Service: Endoscopy;  Laterality: N/A;  12:30   CORONARY ANGIOPLASTY WITH STENT PLACEMENT     1999, 2009   CORONARY ARTERY BYPASS GRAFT N/A 10/07/2012   Procedure: CORONARY ARTERY BYPASS GRAFTING (CABG);  Surgeon: Rexene Alberts, MD;  Location: [place];  Service: Open Heart Surgery;  Laterality: N/A;   CYSTOSCOPY WITH LITHOLAPAXY N/A 10/29/2019   Procedure: CYSTOSCOPY WITH LITHOLAPAXY;  Surgeon: Irine Seal, MD;  Location: [facility];  Service: Urology;  Laterality: N/A;    CYSTOSCOPY WITH RETROGRADE PYELOGRAM, URETEROSCOPY AND STENT PLACEMENT Left 08/30/2015   Procedure: CYSTOSCOPY LEFT RETROGRADE PYELOGRAM  LEFT   URETEROSCOPY,  LITHOLAPAXY;  Surgeon: Irine Seal, MD;  Location: [facility];  Service: Urology;  Laterality: Left;   ESOPHAGOGASTRODUODENOSCOPY  04/04/2012   Procedure: ESOPHAGOGASTRODUODENOSCOPY (EGD);  Surgeon: Rogene Houston, MD;  [facility];  Service: Endoscopy;  Laterality: N/A;  830   ESOPHAGOGASTRODUODENOSCOPY  07/25/2012   Procedure: ESOPHAGOGASTRODUODENOSCOPY (EGD);  Surgeon: Rogene Houston, MD;  [facility];  Service: Endoscopy;  Laterality: N/A;  830   HOLMIUM LASER APPLICATION Left 0/35/0093   Procedure: HOLMIUM LASER APPLICATION;  Surgeon: Irine Seal, MD;  Location: [facility];  Service: Urology;  Laterality: Left;   INGUINAL HERNIA REPAIR     INTRAOPERATIVE TRANSESOPHAGEAL ECHOCARDIOGRAM N/A 10/07/2012   Procedure: INTRAOPERATIVE TRANSESOPHAGEAL ECHOCARDIOGRAM;  Surgeon: Rexene Alberts, MD;  Location: [place];  Service: Open Heart Surgery;  Laterality: N/A;   LEFT HEART CATHETERIZATION WITH CORONARY ANGIOGRAM N/A 10/03/2012   Procedure: LEFT HEART CATHETERIZATION WITH CORONARY ANGIOGRAM;  Surgeon: Minus Breeding, MD;  Location[facility];  Service: Cardiovascular;  Laterality: N/A;   LITHOTRIPSY     POLYPECTOMY  01/19/2021   Procedure: POLYPECTOMY;  Surgeon: Rogene Houston, MD;  [facility];  Service: Endoscopy;;       Home Medications    Prior to Admission medications   Medication Sig Start Date End Date Taking? Authorizing Provider  acetaminophen (TYLENOL) 500 MG tablet Take 500 mg by mouth at bedtime.    [provider]  amoxicillin-clavulanate (AUGMENTIN) 875-125 MG tablet Take 1 tablet by mouth every 12 (twelve) hours. 11/22/21   Horton, Barbette Hair, MD  aspirin EC 81 MG tablet Take 1 tablet (81 mg total) by mouth daily. 01/20/21   Rogene Houston, MD  atorvastatin (LIPITOR) 40 MG tablet TAKE 1  TABLET BY MOUTH EVERY DAY 06/28/21   Arnoldo Lenis, MD  cetirizine (ZYRTEC) 10 MG tablet Take 10 mg by mouth daily.    [provider]  finasteride (PROSCAR) 5 MG tablet Take 1 tablet (5 mg total) by mouth daily. 06/15/21   Irine Seal, MD  fluticasone (FLONASE) 50 MCG/ACT nasal spray Place 1 spray into both nostrils daily. 03/19/21   Barton Dubois, MD  ipratropium (ATROVENT) 0.03 % nasal spray Place 2 sprays into both nostrils 2 (two) times daily. 08/20/21   Jaynee Eagles, PA-C  levocetirizine (XYZAL) 5 MG tablet Take 1 tablet (5 mg total) by mouth every evening. 08/20/21   Jaynee Eagles, PA-C  metFORMIN (GLUCOPHAGE) 1000 MG tablet Take 1,000 mg by mouth 2 (two) times daily.    [provider]  metoprolol tartrate (LOPRESSOR) 25 MG tablet TAKE 1 TABLET BY MOUTH TWICE A DAY 08/21/21   Arnoldo Lenis, MD  nicotine (NICODERM CQ - DOSED IN MG/24 HOURS) 14 mg/24hr patch Place  1 patch (14 mg total) onto the skin daily. 03/19/21   Barton Dubois, MD  [facility] ULTRA test strip USE TO TEST SUGAR THREE TIMES DAILY 12/21/20   [provider]  predniSONE (DELTASONE) 20 MG tablet Take 1 tablet (20 mg total) by mouth daily with breakfast. 08/20/21   Jaynee Eagles, PA-C  ramipril (ALTACE) 10 MG capsule TAKE 2 CAPSULES BY MOUTH EVERY DAY WITH BREAKFAST Patient taking differently: Take 20 mg by mouth daily. 03/14/21   Arnoldo Lenis, MD  saccharomyces boulardii (FLORASTOR) 250 MG capsule Take 1 capsule (250 mg total) by mouth 2 (two) times daily. 03/18/21   Barton Dubois, MD  tamsulosin (FLOMAX) 0.4 MG CAPS capsule TAKE 1 CAPSULE BY MOUTH EVERY DAY 07/06/21   Irine Seal, MD    Family History Family History  Problem Relation Age of Onset   Diabetes Mother    Ovarian cancer Sister     Social History Social History   Tobacco Use   Smoking status: Every Day    Packs/day: 0.50    Years: 40.00    Total pack years: 20.00    Types: Cigarettes    Last attempt to quit: 07/09/2013     Years since quitting: 8.4   Smokeless tobacco: Never   Tobacco comments:    6-7 cigs. daily   Vaping Use   Vaping Use: Never used  Substance Use Topics   Alcohol use: No    Alcohol/week: 0.0 standard drinks of alcohol   Drug use: No     Allergies   Meperidine hcl and Promethazine hcl   Review of Systems Review of Systems Per HPI  Physical Exam Triage Vital Signs ED Triage Vitals  Enc Vitals Group     BP 12/19/21 0922 (!) 173/73     Pulse Rate 12/19/21 0922 (!) 37     Resp 12/19/21 0922 16     Temp 12/19/21 0922 98 F (36.7 C)     Temp Source 12/19/21 0922 Oral     SpO2 12/19/21 0922 96 %     Weight --      Height --      Head Circumference --      Peak Flow --      Pain Score 12/19/21 0912 0     Pain Loc --      Pain Edu? --      Excl. in [place]? --    No data found.  Updated Vital Signs BP (!) 173/73 (BP Location: Right Arm)   Pulse (!) 37   Temp 98 F (36.7 C) (Oral)   Resp 16   SpO2 96%   Visual Acuity Right Eye Distance:   Left Eye Distance:   Bilateral Distance:    Right Eye Near:   Left Eye Near:    Bilateral Near:     Physical Exam Vitals and nursing note reviewed.  Constitutional:      General: He is not in acute distress.    Appearance: Normal appearance. He is not toxic-appearing.  HENT:     Right Ear: Tympanic membrane, ear canal and external ear normal.     Left Ear: There is impacted cerumen.     Nose: Nose normal. No congestion.     Mouth/Throat:     Mouth: Mucous membranes are moist.     Pharynx: Oropharynx is clear.  Cardiovascular:     Rate and Rhythm: Normal rate. Rhythm irregular.  Pulmonary:     Effort: Pulmonary effort is normal. No  respiratory distress.  Skin:    General: Skin is warm and dry.     Capillary Refill: Capillary refill takes less than 2 seconds.     Coloration: Skin is not jaundiced or pale.     Findings: No erythema.  Neurological:     Mental Status: He is alert and oriented to person, place, and time.   Psychiatric:        Behavior: Behavior is cooperative.      UC Treatments / Results  Labs (all labs ordered are listed, but only abnormal results are displayed) Labs Reviewed - No data to display  EKG   Radiology No results found.  Procedures Procedures (including critical care time)  Medications Ordered in UC Medications - No data to display  Initial Impression / Assessment and Plan / UC Course  I have reviewed the triage vital signs and the nursing notes.  Pertinent labs & imaging results that were available during my care of the patient were reviewed by me and considered in my medical decision making (see chart for details).    Very pleasant and well-appearing 71 year old male presenting for left ear fullness.  Ear lavage today provided relief; impacted cerumen was completely removed and tympanic membrane not erythematous, bulging, or ruptured upon reassessment.  Encouraged abstaining from Q-tips.  We also briefly discussed chronic tinnitus and I gave the patient contact information for an audiologist if he desires follow-up with a specialist.  EKG today showed sinus rhythm with frequent PVCs; this is the patient's baseline.  He is not having any new symptoms today, therefore can follow-up with cardiologist as already planned. Final Clinical Impressions(s) / UC Diagnoses   Final diagnoses:  Impacted cerumen of left ear     Discharge Instructions      - We were able to remove the ear wax from your left ear today and the ear drum looks great - Please continue to not use Q-tips - If you would like to talk with an Audiologist, contact information is below     ED Prescriptions   None    PDMP not reviewed this encounter.   Eulogio Bear, NP 12/19/21 1009

## 2021-12-19 NOTE — ED Triage Notes (Signed)
Pt reports left ear fullness x 2-3 weeks.   Pt reports 1 month ago he was hit ion the head by a piece of tree when he was cutting the tree.

## 2021-12-19 NOTE — Discharge Instructions (Signed)
-   We were able to remove the ear wax from your left ear today and the ear drum looks great - Please continue to not use Q-tips - If you would like to talk with an Audiologist, contact information is below

## 2022-01-25 ENCOUNTER — Other Ambulatory Visit: Payer: Medicare HMO

## 2022-01-25 DIAGNOSIS — N401 Enlarged prostate with lower urinary tract symptoms: Secondary | ICD-10-CM | POA: Diagnosis not present

## 2022-01-25 DIAGNOSIS — N138 Other obstructive and reflux uropathy: Secondary | ICD-10-CM

## 2022-01-26 LAB — PSA, TOTAL AND FREE
PSA, Free Pct: 33.3 %
PSA, Free: 0.2 ng/mL
Prostate Specific Ag, Serum: 0.6 ng/mL (ref 0.0–4.0)

## 2022-02-01 ENCOUNTER — Encounter: Payer: Self-pay | Admitting: Urology

## 2022-02-01 ENCOUNTER — Ambulatory Visit: Payer: Medicare HMO | Admitting: Urology

## 2022-02-01 VITALS — BP 130/52 | HR 41

## 2022-02-01 DIAGNOSIS — N200 Calculus of kidney: Secondary | ICD-10-CM

## 2022-02-01 DIAGNOSIS — N138 Other obstructive and reflux uropathy: Secondary | ICD-10-CM | POA: Diagnosis not present

## 2022-02-01 DIAGNOSIS — R350 Frequency of micturition: Secondary | ICD-10-CM | POA: Diagnosis not present

## 2022-02-01 DIAGNOSIS — N401 Enlarged prostate with lower urinary tract symptoms: Secondary | ICD-10-CM | POA: Diagnosis not present

## 2022-02-01 DIAGNOSIS — R972 Elevated prostate specific antigen [PSA]: Secondary | ICD-10-CM | POA: Diagnosis not present

## 2022-02-01 DIAGNOSIS — R3129 Other microscopic hematuria: Secondary | ICD-10-CM | POA: Diagnosis not present

## 2022-02-01 LAB — MICROSCOPIC EXAMINATION
RBC, Urine: >30 /hpf — AB (ref 0–2)
Renal Epithel, UA: NONE SEEN /hpf

## 2022-02-01 LAB — URINALYSIS, ROUTINE W REFLEX MICROSCOPIC
Bilirubin, UA: NEGATIVE
Glucose, UA: NEGATIVE
Nitrite, UA: NEGATIVE
Specific Gravity, UA: 1.025 (ref 1.005–1.030)
Urobilinogen, Ur: 0.2 mg/dL (ref 0.2–1.0)
pH, UA: 5 (ref 5.0–7.5)

## 2022-02-01 MED ORDER — FINASTERIDE 5 MG PO TABS: 5.0000 mg | ORAL_TABLET | Freq: Every day | ORAL | 3 refills | Status: DC

## 2022-02-01 MED ORDER — TAMSULOSIN HCL 0.4 MG PO CAPS: 0.4000 mg | ORAL_CAPSULE | Freq: Every day | ORAL | 3 refills | Status: DC

## 2022-02-01 NOTE — Progress Notes (Signed)
Subjective:  1. Elevated PSA   2. Renal stones   3. Microhematuria   4. BPH with urinary obstruction   5. Urinary frequency     [name] returns in f/u.  He had a cystolithalopaxy on 10/29/19 for a 2.6cm stone.  He is doing well without hematuria and minimal LUTS with an IPSS of 1.    He has some BPH with BOO and has had a good response to tamsulosin in the past but his LUTS haven't been too severe.    Pt is a 71YO male with h/o renal and bladder stones who presents with c/o 4 day h/o left sided abdominal pain radiating to the lower abdomen, gross hematuria and passage of clots. No dysuria, burning. Pt denies fever, chills, back pain, nausea, vomiting. Pt on ASA daily. Otherwise, has been doing well with LUTS on Flomax daily. Pt also requests change of Rxs as tamsolosin will become tier 2 with his new insurance. IPPS score=4 PVR=72m  CT shows stable renal stones with the largest in the [facility] about 7-891m  No hydro is noted.  He has prostate enlargement but no obvious bladder lesions.  There is some interval enlargement in the AAA.    08/03/21: [name] today in f/u for cystoscopy for further evaluation of recent gross hematuria.   He has been on finasteride with the tamsulosin as the bleeding was presumed to be prostatic pending the cystoscopy.    02/01/22: [name] today in f/u.  He passed a small stone Tuesday.  He has no pain currently or further gross hematuria.   His UA today has 6-10 RBC's and >30 RBC's with few bacteria.  He remains on tamsulosin and finasteride and is voiding well with an IPSS of 5.  Cysto in February for gross hematuria just showed trilobar hyperplasia and some grit in the bladder.   He has known renal stones.    His PSA is 0.6.     IPSS     Row Name 02/01/22 0900         International Prostate Symptom Score   How often have you had the sensation of not emptying your bladder? Less than 1 in 5     How often have you had to urinate less than every two hours? Less  than 1 in 5 times     How often have you found you stopped and started again several times when you urinated? Less than 1 in 5 times     How often have you found it difficult to postpone urination? Not at All     How often have you had a weak urinary stream? Less than 1 in 5 times     How often have you had to strain to start urination? Not at All     How many times did you typically get up at night to urinate? 1 Time     Total IPSS Score 5       Quality of Life due to urinary symptoms   If you were to spend the rest of your life with your urinary condition just the way it is now how would you feel about that? Pleased               ROS:  ROS:  A complete review of systems was performed.  All systems are negative except for pertinent findings as noted.   Review of Systems  All other systems reviewed and are negative.   Allergies  Allergen Reactions  Meperidine Hcl Other (See Comments)    Was mixed with phenergan and turned blood vessels red.    Promethazine Hcl Other (See Comments)    Was mixed with the Demerol and caused veins to turn red.     Outpatient Encounter Medications as of 02/01/2022  Medication Sig   acetaminophen (TYLENOL) 500 MG tablet Take 500 mg by mouth at bedtime.   amoxicillin-clavulanate (AUGMENTIN) 875-125 MG tablet Take 1 tablet by mouth every 12 (twelve) hours.   aspirin EC 81 MG tablet Take 1 tablet (81 mg total) by mouth daily.   atorvastatin (LIPITOR) 40 MG tablet TAKE 1 TABLET BY MOUTH EVERY DAY   cetirizine (ZYRTEC) 10 MG tablet Take 10 mg by mouth daily.   fluticasone (FLONASE) 50 MCG/ACT nasal spray Place 1 spray into both nostrils daily.   ipratropium (ATROVENT) 0.03 % nasal spray Place 2 sprays into both nostrils 2 (two) times daily.   levocetirizine (XYZAL) 5 MG tablet Take 1 tablet (5 mg total) by mouth every evening.   metFORMIN (GLUCOPHAGE) 1000 MG tablet Take 1,000 mg by mouth 2 (two) times daily.   metoprolol tartrate (LOPRESSOR) 25 MG  tablet TAKE 1 TABLET BY MOUTH TWICE A DAY   nicotine (NICODERM CQ - DOSED IN MG/24 HOURS) 14 mg/24hr patch Place 1 patch (14 mg total) onto the skin daily.   ONETOUCH ULTRA test strip USE TO TEST SUGAR THREE TIMES DAILY   predniSONE (DELTASONE) 20 MG tablet Take 1 tablet (20 mg total) by mouth daily with breakfast.   ramipril (ALTACE) 10 MG capsule TAKE 2 CAPSULES BY MOUTH EVERY DAY WITH BREAKFAST (Patient taking differently: Take 20 mg by mouth daily.)   saccharomyces boulardii (FLORASTOR) 250 MG capsule Take 1 capsule (250 mg total) by mouth 2 (two) times daily.   [DISCONTINUED] finasteride (PROSCAR) 5 MG tablet Take 1 tablet (5 mg total) by mouth daily.   [DISCONTINUED] tamsulosin (FLOMAX) 0.4 MG CAPS capsule TAKE 1 CAPSULE BY MOUTH EVERY DAY   finasteride (PROSCAR) 5 MG tablet Take 1 tablet (5 mg total) by mouth daily.   tamsulosin (FLOMAX) 0.4 MG CAPS capsule Take 1 capsule (0.4 mg total) by mouth daily.   No facility-administered encounter medications on file as of 02/01/2022.    Past Medical History:  Diagnosis Date   AAA (abdominal aortic aneurysm) ([place])    3.3 cm in 2012   ABDOMINAL AORTIC ANEURYSM 11/07/2009   Diameter of 3.3 cm by CT in 12/2010    Anxiety    ASCVD (arteriosclerotic cardiovascular disease)    LAD stent in 1999; RCA bare-metal stent in 12/2007, LAD stent patent, 50-60% proximal Cx lesion treated medically; 09/2012: CABG x3, LIMA to LAD, Sequential SVG to ramus intermediate and OM branch with EVH via right thigh   Coronary artery disease    Depression    Diabetes mellitus    DIABETES MELLITUS, TYPE II 08/09/2010   no insulin     ED (erectile dysfunction)    Gastric ulcer    Hepatic steatosis    Hyperlipidemia    HYPERLIPIDEMIA 08/09/2010   Lipid profile in 07/2010:144, 124, 34, 86; 01/2012:93, 13, 37, 43    Hypertension    Insomnia    MI (myocardial infarction) ([place])    Nephrolithiasis    NEPHROLITHIASIS, RECURRENT 08/09/2010   Obstruction of the ureter on CT  scan of the abdomen resulting in right hydronephrosis in 12/2010; bladder outlet obstruction prior to that.  CT also revealed atherosclerotic changes in the abdominal arterial vessels,  hepatic granuloma and calcification of the prostate.    S/P CABG x 3 10/07/2012   LIMA to LAD, Sequential SVG to ramus intermediate and OM branch with EVH via right thigh   THROMBOCYTOPENIA 08/09/2010   Platelets 88-114,000 over the past 4 years; prior evaluation by Dr. Tressie Stalker without a specific etiology identified.  01/2012: Normal CBC ex platelets-120    Thrombocytopenia ([place])    Chronic; evaluated by Dr. Tressie Stalker in 2007   Tobacco abuse     Past Surgical History:  Procedure Laterality Date   COLONOSCOPY N/A 01/19/2021   Procedure: COLONOSCOPY;  Surgeon: Rogene Houston, MD;  [facility];  Service: Endoscopy;  Laterality: N/A;  12:30   CORONARY ANGIOPLASTY WITH STENT PLACEMENT     1999, 2009   CORONARY ARTERY BYPASS GRAFT N/A 10/07/2012   Procedure: CORONARY ARTERY BYPASS GRAFTING (CABG);  Surgeon: Rexene Alberts, MD;  Location: [facility];  Service: Open Heart Surgery;  Laterality: N/A;   CYSTOSCOPY WITH LITHOLAPAXY N/A 10/29/2019   Procedure: CYSTOSCOPY WITH LITHOLAPAXY;  Surgeon: Irine Seal, MD;  Location: [facility];  Service: Urology;  Laterality: N/A;   CYSTOSCOPY WITH RETROGRADE PYELOGRAM, URETEROSCOPY AND STENT PLACEMENT Left 08/30/2015   Procedure: CYSTOSCOPY LEFT RETROGRADE PYELOGRAM  LEFT   URETEROSCOPY,  LITHOLAPAXY;  Surgeon: Irine Seal, MD;  Location: [facility];  Service: Urology;  Laterality: Left;   ESOPHAGOGASTRODUODENOSCOPY  04/04/2012   Procedure: ESOPHAGOGASTRODUODENOSCOPY (EGD);  Surgeon: Rogene Houston, MD;  [facility];  Service: Endoscopy;  Laterality: N/A;  830   ESOPHAGOGASTRODUODENOSCOPY  07/25/2012   Procedure: ESOPHAGOGASTRODUODENOSCOPY (EGD);  Surgeon: Rogene Houston, MD;  [facility];  Service: Endoscopy;  Laterality: N/A;  830   HOLMIUM LASER  APPLICATION Left 0/27/7412   Procedure: HOLMIUM LASER APPLICATION;  Surgeon: Irine Seal, MD;  Location: [facility];  Service: Urology;  Laterality: Left;   INGUINAL HERNIA REPAIR     INTRAOPERATIVE TRANSESOPHAGEAL ECHOCARDIOGRAM N/A 10/07/2012   Procedure: INTRAOPERATIVE TRANSESOPHAGEAL ECHOCARDIOGRAM;  Surgeon: Rexene Alberts, MD;  Location: [facility];  Service: Open Heart Surgery;  Laterality: N/A;   LEFT HEART CATHETERIZATION WITH CORONARY ANGIOGRAM N/A 10/03/2012   Procedure: LEFT HEART CATHETERIZATION WITH CORONARY ANGIOGRAM;  Surgeon: Minus Breeding, MD;  Location[facility];  Service: Cardiovascular;  Laterality: N/A;   LITHOTRIPSY     POLYPECTOMY  01/19/2021   Procedure: POLYPECTOMY;  Surgeon: Rogene Houston, MD;  [facility];  Service: Endoscopy;;    Social History   Socioeconomic History   Marital status: Married    Spouse name: Not on file   Number of children: Not on file   Years of education: Not on file   Highest education level: Not on file  Occupational History   Not on file  Tobacco Use   Smoking status: Every Day    Packs/day: 0.50    Years: 40.00    Total pack years: 20.00    Types: Cigarettes    Last attempt to quit: 07/09/2013    Years since quitting: 8.5   Smokeless tobacco: Never   Tobacco comments:    6-7 cigs. daily   Vaping Use   Vaping Use: Never used  Substance and Sexual Activity   Alcohol use: No    Alcohol/week: 0.0 standard drinks of alcohol   Drug use: No   Sexual activity: Not on file  Other Topics Concern   Not on file  Social History Narrative   No regular exercise  Social Determinants of Health   Financial Resource Strain: Not on file  Food Insecurity: Not on file  Transportation Needs: Not on file  Physical Activity: Not on file  Stress: Not on file  Social Connections: Not on file  Intimate Partner Violence: Not on file    Family History  Problem Relation Age of Onset   Diabetes Mother    Ovarian cancer Sister         Objective: Vitals:   02/01/22 0857  BP: (!) 130/52  Pulse: (!) 41     Physical Exam Vitals reviewed.  Constitutional:      Appearance: Normal appearance.  Neurological:     Mental Status: He is alert.     Lab Results:  Results for orders placed or performed in visit on 02/01/22 (from the past 24 hour(s))  Urinalysis, Routine w reflex microscopic     Status: Abnormal   Collection Time: 02/01/22  9:43 AM  Result Value Ref Range   Specific Gravity, UA 1.025 1.005 - 1.030   pH, UA 5.0 5.0 - 7.5   Color, UA Yellow Yellow   Appearance Ur Clear Clear   Leukocytes,UA Trace (A) Negative   Protein,UA 2+ (A) Negative/Trace   Glucose, UA Negative Negative   Ketones, UA Trace (A) Negative   RBC, UA 3+ (A) Negative   Bilirubin, UA Negative Negative   Urobilinogen, Ur 0.2 0.2 - 1.0 mg/dL   Nitrite, UA Negative Negative   Microscopic Examination See below:    Narrative   Performed at:  [facility] [address] [place], [place]  [id number] Lab Director: Mina Marble MT, Phone:  [phone]  Microscopic Examination     Status: Abnormal   Collection Time: 02/01/22  9:43 AM   Urine  Result Value Ref Range   WBC, UA 6-10 (A) 0 - 5 /hpf   RBC, Urine >30 (A) 0 - 2 /hpf   Epithelial Cells (non renal) 0-10 0 - 10 /hpf   Renal Epithel, UA None seen None seen /hpf   Mucus, UA Present Not Estab.   Bacteria, UA Few None seen/Few   Narrative   Performed at:  [facility] [address] [place], [place]  [id number] Lab Director: [place], Phone:  [phone]       BMET No results for input(s): "NA", "K", "CL", "CO2", "GLUCOSE", "BUN", "CREATININE", "CALCIUM" in the last 72 hours. PSA No results found for: "PSA" No results found for: "TESTOSTERONE" Lab Results  Component Value Date   PSA1 0.6 01/25/2022   Cr was 1.05 on 11/22/21.   Studies/Results:    Assessment & Plan:  Nephrolithiasis with recent stone passage and  residual microhematuria.   I will get a KUB at 6 month f/u.    BPH with BOO.  He will stay on tamsulosin and finasteride which I refilled.       Meds ordered this encounter  Medications   finasteride (PROSCAR) 5 MG tablet    Sig: Take 1 tablet (5 mg total) by mouth daily.    Dispense:  90 tablet    Refill:  3   tamsulosin (FLOMAX) 0.4 MG CAPS capsule    Sig: Take 1 capsule (0.4 mg total) by mouth daily.    Dispense:  90 capsule    Refill:  3      Orders Placed This Encounter  Procedures   Microscopic Examination   DG Abd 1 View    Standing Status:   Future  Standing Expiration Date:   02/02/2023    Order Specific Question:   Reason for Exam (SYMPTOM  OR DIAGNOSIS REQUIRED)    Answer:   renal stone    Order Specific Question:   Preferred imaging location?    Answer:   [facility]    Order Specific Question:   Radiology Contrast Protocol - do NOT remove file path    Answer:   [email or web address]   Urinalysis, Routine w reflex microscopic       Return in about 6 months (around 08/04/2022) for with KUB.   CC: Redmond School, MD      Irine Seal 02/02/2022 Patient ID: [name], male   DOB: 06/12/1951, 71 y.o.   MRN: [id number] Patient ID: [name], male   DOB: 26-May-1951, 71 y.o.   MRN: [id number] Patient ID: [name], male   DOB: [birth year], 71 y.o.   MRN: [id number]

## 2022-02-13 DIAGNOSIS — I25799 Atherosclerosis of other coronary artery bypass graft(s) with unspecified angina pectoris: Secondary | ICD-10-CM | POA: Diagnosis not present

## 2022-02-13 DIAGNOSIS — E7849 Other hyperlipidemia: Secondary | ICD-10-CM | POA: Diagnosis not present

## 2022-02-13 DIAGNOSIS — E1151 Type 2 diabetes mellitus with diabetic peripheral angiopathy without gangrene: Secondary | ICD-10-CM | POA: Diagnosis not present

## 2022-02-13 DIAGNOSIS — I2510 Atherosclerotic heart disease of native coronary artery without angina pectoris: Secondary | ICD-10-CM | POA: Diagnosis not present

## 2022-02-13 DIAGNOSIS — E782 Mixed hyperlipidemia: Secondary | ICD-10-CM | POA: Diagnosis not present

## 2022-02-13 DIAGNOSIS — Z1331 Encounter for screening for depression: Secondary | ICD-10-CM | POA: Diagnosis not present

## 2022-02-13 DIAGNOSIS — Z0001 Encounter for general adult medical examination with abnormal findings: Secondary | ICD-10-CM | POA: Diagnosis not present

## 2022-02-13 DIAGNOSIS — I10 Essential (primary) hypertension: Secondary | ICD-10-CM | POA: Diagnosis not present

## 2022-02-13 DIAGNOSIS — Z6822 Body mass index (BMI) 22.0-22.9, adult: Secondary | ICD-10-CM | POA: Diagnosis not present

## 2022-02-13 DIAGNOSIS — N202 Calculus of kidney with calculus of ureter: Secondary | ICD-10-CM | POA: Diagnosis not present

## 2022-02-25 ENCOUNTER — Other Ambulatory Visit: Payer: Self-pay | Admitting: Cardiology

## 2022-03-08 ENCOUNTER — Other Ambulatory Visit: Payer: Self-pay | Admitting: Cardiology

## 2022-03-12 ENCOUNTER — Ambulatory Visit
Admission: EM | Admit: 2022-03-12 | Discharge: 2022-03-12 | Disposition: A | Discharge status: Home / Self Care | Payer: Medicare HMO

## 2022-03-13 ENCOUNTER — Other Ambulatory Visit: Payer: Self-pay | Admitting: Cardiology

## 2022-03-13 ENCOUNTER — Ambulatory Visit
Admission: EM | Admit: 2022-03-13 | Discharge: 2022-03-13 | Disposition: A | Discharge status: Home / Self Care | Payer: Medicare HMO | Attending: Nurse Practitioner | Admitting: Nurse Practitioner

## 2022-03-13 VITALS — BP 142/75 | HR 65 | Temp 98.1°F | Resp 18

## 2022-03-13 DIAGNOSIS — R21 Rash and other nonspecific skin eruption: Secondary | ICD-10-CM

## 2022-03-13 MED ORDER — PREDNISONE 10 MG (21) PO TBPK: ORAL_TABLET | ORAL | 0 refills | Status: AC

## 2022-03-13 NOTE — ED Triage Notes (Signed)
Pt present rash that has spreaded on his legs, arms and chest area. Symptoms started Friday night. Pt stats the area is itching and they are red dots.

## 2022-03-13 NOTE — Discharge Instructions (Addendum)
Take the prednisone as prescribed for the rash.  You can continue to use over-the-counter hydrocortisone cream to help with the itching.  You can also use an oral antihistamine like cetirizine, fexofenadine, or diphenhydramine to help with the itching at nighttime.  Follow-up with your primary care provider if your symptoms persist despite treatment.

## 2022-03-13 NOTE — ED Provider Notes (Signed)
[facility]    CSN: [id number] Arrival date & time: 03/13/22  0947      History   Chief Complaint Chief Complaint  Patient presents with   Rash    Entered by patient    HPI [name] is a 71 y.o. male.   Patient presents today for rash to bilateral lower extremities, moving up legs, onto abdomen and arms now.  Reports the rash is red and itchy.  It does not burn and has not used anything.  No scaling or blisters.  No fevers, nausea/vomiting.  No shortness of breath or throat or tongue swelling.  Denies any recent change in detergents, soaps, personal care products.  Reports he recently traveled to [place], however this was 3 weeks ago.  Is use over-the-counter cortisone cream which helps temporarily.  Reports the rash is intensely itchy at night and is affecting his sleep.  Patient reports he had a rash similar to this a couple of years ago and was treated in urgent care and improved.  Denies any known contacts with similar symptoms.  Patient reports history of type 2 diabetes-last A1c 6.1%.  Denies recent corticosteroid use in the past 90 days.    Past Medical History:  Diagnosis Date   AAA (abdominal aortic aneurysm) ([place])    3.3 cm in 2012   ABDOMINAL AORTIC ANEURYSM 11/07/2009   Diameter of 3.3 cm by CT in 12/2010    Anxiety    ASCVD (arteriosclerotic cardiovascular disease)    LAD stent in 1999; RCA bare-metal stent in 12/2007, LAD stent patent, 50-60% proximal Cx lesion treated medically; 09/2012: CABG x3, LIMA to LAD, Sequential SVG to ramus intermediate and OM branch with EVH via right thigh   Coronary artery disease    Depression    Diabetes mellitus    DIABETES MELLITUS, TYPE II 08/09/2010   no insulin     ED (erectile dysfunction)    Gastric ulcer    Hepatic steatosis    Hyperlipidemia    HYPERLIPIDEMIA 08/09/2010   Lipid profile in 07/2010:144, 124, 34, 86; 01/2012:93, 11, 21, 43    Hypertension    Insomnia    MI (myocardial infarction)  ([place])    Nephrolithiasis    NEPHROLITHIASIS, RECURRENT 08/09/2010   Obstruction of the ureter on CT scan of the abdomen resulting in right hydronephrosis in 12/2010; bladder outlet obstruction prior to that.  CT also revealed atherosclerotic changes in the abdominal arterial vessels, hepatic granuloma and calcification of the prostate.    S/P CABG x 3 10/07/2012   LIMA to LAD, Sequential SVG to ramus intermediate and OM branch with EVH via right thigh   THROMBOCYTOPENIA 08/09/2010   Platelets 88-114,000 over the past 4 years; prior evaluation by Dr. Tressie Stalker without a specific etiology identified.  01/2012: Normal CBC ex platelets-120    Thrombocytopenia ([place])    Chronic; evaluated by Dr. Tressie Stalker in 2007   Tobacco abuse     Patient Active Problem List   Diagnosis Date Noted   Left upper quadrant abdominal pain 06/15/2021   Gross hematuria 06/15/2021   BPH with urinary obstruction 06/15/2021   Weakness    CAP (community acquired pneumonia) 03/15/2021   Ureteral stone 07/09/2013   Hypertension 10/02/2012   Type 2 diabetes mellitus with hyperlipidemia ([place]) 08/09/2010   HYPERLIPIDEMIA 08/09/2010   THROMBOCYTOPENIA 08/09/2010   Arteriosclerotic cardiovascular disease (ASCVD) 08/09/2010   Renal stones 08/09/2010   ERECTILE DYSFUNCTION, ORGANIC 08/09/2010   INSOMNIA 08/09/2010   Abdominal  aortic aneurysm ([place]) 11/07/2009    Past Surgical History:  Procedure Laterality Date   COLONOSCOPY N/A 01/19/2021   Procedure: COLONOSCOPY;  Surgeon: Rogene Houston, MD;  [facility];  Service: Endoscopy;  Laterality: N/A;  12:30   CORONARY ANGIOPLASTY WITH STENT PLACEMENT     1999, 2009   CORONARY ARTERY BYPASS GRAFT N/A 10/07/2012   Procedure: CORONARY ARTERY BYPASS GRAFTING (CABG);  Surgeon: Rexene Alberts, MD;  Location: [facility];  Service: Open Heart Surgery;  Laterality: N/A;   CYSTOSCOPY WITH LITHOLAPAXY N/A 10/29/2019   Procedure: CYSTOSCOPY WITH LITHOLAPAXY;  Surgeon: Irine Seal, MD;  [facility];  Service: Urology;  Laterality: N/A;   CYSTOSCOPY WITH RETROGRADE PYELOGRAM, URETEROSCOPY AND STENT PLACEMENT Left 08/30/2015   Procedure: CYSTOSCOPY LEFT RETROGRADE PYELOGRAM  LEFT   URETEROSCOPY,  LITHOLAPAXY;  Surgeon: Irine Seal, MD;  [facility];  Service: Urology;  Laterality: Left;   ESOPHAGOGASTRODUODENOSCOPY  04/04/2012   Procedure: ESOPHAGOGASTRODUODENOSCOPY (EGD);  Surgeon: Rogene Houston, MD;  [facility];  Service: Endoscopy;  Laterality: N/A;  830   ESOPHAGOGASTRODUODENOSCOPY  07/25/2012   Procedure: ESOPHAGOGASTRODUODENOSCOPY (EGD);  Surgeon: Rogene Houston, MD;  [facility];  Service: Endoscopy;  Laterality: N/A;  830   HOLMIUM LASER APPLICATION Left 9/48/5462   Procedure: HOLMIUM LASER APPLICATION;  Surgeon: Irine Seal, MD;  [facility];  Service: Urology;  Laterality: Left;   INGUINAL HERNIA REPAIR     INTRAOPERATIVE TRANSESOPHAGEAL ECHOCARDIOGRAM N/A 10/07/2012   Procedure: INTRAOPERATIVE TRANSESOPHAGEAL ECHOCARDIOGRAM;  Surgeon: Rexene Alberts, MD;  Location: [facility];  Service: Open Heart Surgery;  Laterality: N/A;   LEFT HEART CATHETERIZATION WITH CORONARY ANGIOGRAM N/A 10/03/2012   Procedure: LEFT HEART CATHETERIZATION WITH CORONARY ANGIOGRAM;  Surgeon: Minus Breeding, MD;  [facility];  Service: Cardiovascular;  Laterality: N/A;   LITHOTRIPSY     POLYPECTOMY  01/19/2021   Procedure: POLYPECTOMY;  Surgeon: Rogene Houston, MD;  [facility];  Service: Endoscopy;;       Home Medications    Prior to Admission medications   Medication Sig Start Date End Date Taking? Authorizing Provider  predniSONE (STERAPRED UNI-PAK 21 TAB) 10 MG (21) TBPK tablet Take 6 tablets (60 mg total) by mouth daily for 1 day, THEN 5 tablets (50 mg total) daily for 1 day, THEN 4 tablets (40 mg total) daily for 1 day, THEN 4 tablets (40 mg total) daily for 1 day, THEN 3 tablets (30 mg total) daily for 1 day, THEN 2  tablets (20 mg total) daily for 1 day, THEN 1 tablet (10 mg total) daily for 1 day. 03/13/22 03/20/22 Yes Eulogio Bear, NP  acetaminophen (TYLENOL) 500 MG tablet Take 500 mg by mouth at bedtime.    [provider]  amoxicillin-clavulanate (AUGMENTIN) 875-125 MG tablet Take 1 tablet by mouth every 12 (twelve) hours. 11/22/21   Horton, Barbette Hair, MD  aspirin EC 81 MG tablet Take 1 tablet (81 mg total) by mouth daily. 01/20/21   Rogene Houston, MD  atorvastatin (LIPITOR) 40 MG tablet TAKE 1 TABLET BY MOUTH EVERY DAY 02/26/22   Arnoldo Lenis, MD  cetirizine (ZYRTEC) 10 MG tablet Take 10 mg by mouth daily.    [provider]  finasteride (PROSCAR) 5 MG tablet Take 1 tablet (5 mg total) by mouth daily. 02/01/22   Irine Seal, MD  fluticasone (FLONASE) 50 MCG/ACT nasal spray Place 1 spray into both nostrils daily. 03/19/21  Barton Dubois, MD  ipratropium (ATROVENT) 0.03 % nasal spray Place 2 sprays into both nostrils 2 (two) times daily. 08/20/21   Jaynee Eagles, PA-C  levocetirizine (XYZAL) 5 MG tablet Take 1 tablet (5 mg total) by mouth every evening. 08/20/21   Jaynee Eagles, PA-C  metFORMIN (GLUCOPHAGE) 1000 MG tablet Take 1,000 mg by mouth 2 (two) times daily.    [provider]  metoprolol tartrate (LOPRESSOR) 25 MG tablet TAKE 1 TABLET BY MOUTH TWICE A DAY 08/21/21   Arnoldo Lenis, MD  nicotine (NICODERM CQ - DOSED IN MG/24 HOURS) 14 mg/24hr patch Place 1 patch (14 mg total) onto the skin daily. 03/19/21   Barton Dubois, MD  [facility] ULTRA test strip USE TO TEST SUGAR THREE TIMES DAILY 12/21/20   [provider]  ramipril (ALTACE) 10 MG capsule TAKE 2 CAPSULES BY MOUTH EVERY DAY WITH BREAKFAST 03/08/22   Arnoldo Lenis, MD  saccharomyces boulardii (FLORASTOR) 250 MG capsule Take 1 capsule (250 mg total) by mouth 2 (two) times daily. 03/18/21   Barton Dubois, MD  tamsulosin (FLOMAX) 0.4 MG CAPS capsule Take 1 capsule (0.4 mg total) by mouth daily. 02/01/22    Irine Seal, MD    Family History Family History  Problem Relation Age of Onset   Diabetes Mother    Ovarian cancer Sister     Social History Social History   Tobacco Use   Smoking status: Every Day    Packs/day: 0.50    Years: 40.00    Total pack years: 20.00    Types: Cigarettes    Last attempt to quit: 07/09/2013    Years since quitting: 8.6   Smokeless tobacco: Never   Tobacco comments:    6-7 cigs. daily   Vaping Use   Vaping Use: Never used  Substance Use Topics   Alcohol use: No    Alcohol/week: 0.0 standard drinks of alcohol   Drug use: No     Allergies   Meperidine hcl and Promethazine hcl   Review of Systems Review of Systems Per HPI  Physical Exam Triage Vital Signs ED Triage Vitals  Enc Vitals Group     BP 03/13/22 1009 (!) 142/75     Pulse Rate 03/13/22 1009 65     Resp 03/13/22 1009 18     Temp 03/13/22 1009 98.1 F (36.7 C)     Temp Source 03/13/22 1009 Oral     SpO2 03/13/22 1009 97 %     Weight --      Height --      Head Circumference --      Peak Flow --      Pain Score 03/13/22 1010 0     Pain Loc --      Pain Edu? --      Excl. in [place]? --    No data found.  Updated Vital Signs BP (!) 142/75 (BP Location: Right Arm)   Pulse 65   Temp 98.1 F (36.7 C) (Oral)   Resp 18   SpO2 97%   Visual Acuity Right Eye Distance:   Left Eye Distance:   Bilateral Distance:    Right Eye Near:   Left Eye Near:    Bilateral Near:     Physical Exam Vitals and nursing note reviewed.  Constitutional:      General: He is not in acute distress.    Appearance: Normal appearance. He is not toxic-appearing.  HENT:     Head: Normocephalic and atraumatic.  Mouth/Throat:     Mouth: Mucous membranes are moist.     Pharynx: Oropharynx is clear. No oropharyngeal exudate or posterior oropharyngeal erythema.  Pulmonary:     Effort: Pulmonary effort is normal. No respiratory distress.  Skin:    General: Skin is warm and dry.     Capillary  Refill: Capillary refill takes less than 2 seconds.     Findings: Erythema and rash present.     Comments: Widespread, erythematous, maculopapular rash to bilateral lower extremities, trunk, bilateral arms.  The rash on the lower extremities appears more intensely erythematous.  The lesions on the bilateral upper extremities appear more flesh-colored.  No active oozing or drainage.  No surrounding erythema, warmth, drainage.  Neurological:     Mental Status: He is alert and oriented to person, place, and time.  Psychiatric:        Behavior: Behavior is cooperative.      UC Treatments / Results  Labs (all labs ordered are listed, but only abnormal results are displayed) Labs Reviewed - No data to display  EKG   Radiology No results found.  Procedures Procedures (including critical care time)  Medications Ordered in UC Medications - No data to display  Initial Impression / Assessment and Plan / UC Course  I have reviewed the triage vital signs and the nursing notes.  Pertinent labs & imaging results that were available during my care of the patient were reviewed by me and considered in my medical decision making (see chart for details).    Patient is well-appearing, normotensive, afebrile, not tachycardic, not tachypneic, oxygenating well on room air.  Rash consistent with possible reaction to plant dermatitis versus other type of contact dermatitis.  Differential may also include viral rash.  Given itching, will start prednisone taper.  Encouraged use of oral antihistamine to help with itching at nighttime.  Encourage close follow-up here with primary care provider with no improvement.  The patient was given the opportunity to ask questions.  All questions answered to their satisfaction.  The patient is in agreement to this plan.   Final Clinical Impressions(s) / UC Diagnoses   Final diagnoses:  Rash and nonspecific skin eruption     Discharge Instructions      Take the  prednisone as prescribed for the rash.  You can continue to use over-the-counter hydrocortisone cream to help with the itching.  You can also use an oral antihistamine like cetirizine, fexofenadine, or diphenhydramine to help with the itching at nighttime.  Follow-up with your primary care provider if your symptoms persist despite treatment.   ED Prescriptions     Medication Sig Dispense Auth. Provider   predniSONE (STERAPRED UNI-PAK 21 TAB) 10 MG (21) TBPK tablet Take 6 tablets (60 mg total) by mouth daily for 1 day, THEN 5 tablets (50 mg total) daily for 1 day, THEN 4 tablets (40 mg total) daily for 1 day, THEN 4 tablets (40 mg total) daily for 1 day, THEN 3 tablets (30 mg total) daily for 1 day, THEN 2 tablets (20 mg total) daily for 1 day, THEN 1 tablet (10 mg total) daily for 1 day. 21 each Eulogio Bear, NP      PDMP not reviewed this encounter.   Eulogio Bear, NP 03/13/22 1726

## 2022-03-22 ENCOUNTER — Encounter: Payer: Self-pay | Admitting: Cardiology

## 2022-03-22 ENCOUNTER — Ambulatory Visit: Payer: Medicare HMO | Attending: Cardiology | Admitting: Cardiology

## 2022-03-22 VITALS — BP 144/80 | HR 55 | Ht 71.0 in | Wt 160.4 lb

## 2022-03-22 DIAGNOSIS — I10 Essential (primary) hypertension: Secondary | ICD-10-CM

## 2022-03-22 DIAGNOSIS — E782 Mixed hyperlipidemia: Secondary | ICD-10-CM

## 2022-03-22 DIAGNOSIS — I714 Abdominal aortic aneurysm, without rupture, unspecified: Secondary | ICD-10-CM

## 2022-03-22 DIAGNOSIS — I493 Ventricular premature depolarization: Secondary | ICD-10-CM | POA: Diagnosis not present

## 2022-03-22 DIAGNOSIS — I2510 Atherosclerotic heart disease of native coronary artery without angina pectoris: Secondary | ICD-10-CM | POA: Diagnosis not present

## 2022-03-22 NOTE — Progress Notes (Signed)
Clinical Summary Mr. Mcclurg is a 71 y.o.male seen today for follow up of the following medical problems      1. CAD   - prior 3 vessel CABG in 09/2012 (LIMA to LAD, SVG to ramus, SVG to OM), history of PCI prior to that. 09/2012 LVEF 60-65% by echo     - no chest pain, no SOB/DOE - compliant with meds     2. HTN    - compliant with meds - no recent orthostatic symptoms since off HCTZ.   - he is compliant with meds   3. HL   - 01/2022 TC 99 TG 84 HDL 38 LDL 44   4. AAA   - [place] 09/2013 3.5 x 4.5, slightly larger than prior study a year prior (3.3x3.9) - for repeat study in 2019   - no recent symptoms.    06/2019 4.3 cm aneurusm 06/2021 CT renal stone: 4.4 x 4.4 cm previously 4.3 x 3.9 cm.      5. PVCs -no recent symptoms    SH:  Retired Engineer, structural.  On disabilty due to cardaic condition and chronic joint pains.     Past Medical History:  Diagnosis Date   AAA (abdominal aortic aneurysm) ([place])    3.3 cm in 2012   ABDOMINAL AORTIC ANEURYSM 11/07/2009   Diameter of 3.3 cm by CT in 12/2010    Anxiety    ASCVD (arteriosclerotic cardiovascular disease)    LAD stent in 1999; RCA bare-metal stent in 12/2007, LAD stent patent, 50-60% proximal Cx lesion treated medically; 09/2012: CABG x3, LIMA to LAD, Sequential SVG to ramus intermediate and OM [place] Bivins with EVH via right thigh   Coronary artery disease    Depression    Diabetes mellitus    DIABETES MELLITUS, TYPE II 08/09/2010   no insulin     ED (erectile dysfunction)    Gastric ulcer    Hepatic steatosis    Hyperlipidemia    HYPERLIPIDEMIA 08/09/2010   Lipid profile in 07/2010:144, 124, 34, 86; 01/2012:93, 36, 33, 43    Hypertension    Insomnia    MI (myocardial infarction) ([place])    Nephrolithiasis    NEPHROLITHIASIS, RECURRENT 08/09/2010   Obstruction of the ureter on CT scan of the abdomen resulting in right hydronephrosis in 12/2010; bladder outlet obstruction prior to that.  CT also revealed  atherosclerotic changes in the abdominal arterial vessels, hepatic granuloma and calcification of the prostate.    S/P CABG x 3 10/07/2012   LIMA to LAD, Sequential SVG to ramus intermediate and OM [place] Route with EVH via right thigh   THROMBOCYTOPENIA 08/09/2010   Platelets 88-114,000 over the past 4 years; prior evaluation by Dr. Tressie Stalker without a specific etiology identified.  01/2012: Normal CBC ex platelets-120    Thrombocytopenia ([facility])    Chronic; evaluated by Dr. Tressie Stalker in 2007   Tobacco abuse      Allergies  Allergen Reactions   Meperidine Hcl Other (See Comments)    Was mixed with phenergan and turned blood vessels red.    Promethazine Hcl Other (See Comments)    Was mixed with the Demerol and caused veins to turn red.      Current Outpatient Medications  Medication Sig Dispense Refill   acetaminophen (TYLENOL) 500 MG tablet Take 500 mg by mouth at bedtime.     amoxicillin-clavulanate (AUGMENTIN) 875-125 MG tablet Take 1 tablet by mouth every 12 (twelve) hours. 20 tablet 0   aspirin EC 81  MG tablet Take 1 tablet (81 mg total) by mouth daily. 30 tablet 11   atorvastatin (LIPITOR) 40 MG tablet TAKE 1 TABLET BY MOUTH EVERY DAY 90 tablet 1   cetirizine (ZYRTEC) 10 MG tablet Take 10 mg by mouth daily.     finasteride (PROSCAR) 5 MG tablet Take 1 tablet (5 mg total) by mouth daily. 90 tablet 3   fluticasone (FLONASE) 50 MCG/ACT nasal spray Place 1 spray into both nostrils daily. 16 g 0   ipratropium (ATROVENT) 0.03 % nasal spray Place 2 sprays into both nostrils 2 (two) times daily. 30 mL 0   levocetirizine (XYZAL) 5 MG tablet Take 1 tablet (5 mg total) by mouth every evening. 90 tablet 0   metFORMIN (GLUCOPHAGE) 1000 MG tablet Take 1,000 mg by mouth 2 (two) times daily.     metoprolol tartrate (LOPRESSOR) 25 MG tablet TAKE 1 TABLET BY MOUTH TWICE A DAY 180 tablet 1   nicotine (NICODERM CQ - DOSED IN MG/24 HOURS) 14 mg/24hr patch Place 1 patch (14 mg total) onto the skin daily. 28  patch 0   ONETOUCH ULTRA test strip USE TO TEST SUGAR THREE TIMES DAILY     ramipril (ALTACE) 10 MG capsule TAKE 2 CAPSULES BY MOUTH EVERY DAY WITH BREAKFAST 180 capsule 3   saccharomyces boulardii (FLORASTOR) 250 MG capsule Take 1 capsule (250 mg total) by mouth 2 (two) times daily. 20 capsule 0   tamsulosin (FLOMAX) 0.4 MG CAPS capsule Take 1 capsule (0.4 mg total) by mouth daily. 90 capsule 3   No current facility-administered medications for this visit.     Past Surgical History:  Procedure Laterality Date   COLONOSCOPY N/A 01/19/2021   Procedure: COLONOSCOPY;  Surgeon: Rogene Houston, MD;  [facility];  Service: Endoscopy;  Laterality: N/A;  12:30   CORONARY ANGIOPLASTY WITH STENT PLACEMENT     1999, 2009   CORONARY ARTERY BYPASS GRAFT N/A 10/07/2012   Procedure: CORONARY ARTERY BYPASS GRAFTING (CABG);  Surgeon: Rexene Alberts, MD;  Location: [place];  Service: Open Heart Surgery;  Laterality: N/A;   CYSTOSCOPY WITH LITHOLAPAXY N/A 10/29/2019   Procedure: CYSTOSCOPY WITH LITHOLAPAXY;  Surgeon: Irine Seal, MD;  [facility];  Service: Urology;  Laterality: N/A;   CYSTOSCOPY WITH RETROGRADE PYELOGRAM, URETEROSCOPY AND STENT PLACEMENT Left 08/30/2015   Procedure: CYSTOSCOPY LEFT RETROGRADE PYELOGRAM  LEFT   URETEROSCOPY,  LITHOLAPAXY;  Surgeon: Irine Seal, MD;  [facility];  Service: Urology;  Laterality: Left;   ESOPHAGOGASTRODUODENOSCOPY  04/04/2012   Procedure: ESOPHAGOGASTRODUODENOSCOPY (EGD);  Surgeon: Rogene Houston, MD;  [facility];  Service: Endoscopy;  Laterality: N/A;  830   ESOPHAGOGASTRODUODENOSCOPY  07/25/2012   Procedure: ESOPHAGOGASTRODUODENOSCOPY (EGD);  Surgeon: Rogene Houston, MD;  [facility];  Service: Endoscopy;  Laterality: N/A;  830   HOLMIUM LASER APPLICATION Left 3/76/2831   Procedure: HOLMIUM LASER APPLICATION;  Surgeon: Irine Seal, MD;  [facility];  Service: Urology;  Laterality: Left;   INGUINAL HERNIA REPAIR      INTRAOPERATIVE TRANSESOPHAGEAL ECHOCARDIOGRAM N/A 10/07/2012   Procedure: INTRAOPERATIVE TRANSESOPHAGEAL ECHOCARDIOGRAM;  Surgeon: Rexene Alberts, MD;  Location: [place];  Service: Open Heart Surgery;  Laterality: N/A;   LEFT HEART CATHETERIZATION WITH CORONARY ANGIOGRAM N/A 10/03/2012   Procedure: LEFT HEART CATHETERIZATION WITH CORONARY ANGIOGRAM;  Surgeon: Minus Breeding, MD;  Location: [facility] CATH LAB;  Service: Cardiovascular;  Laterality: N/A;   LITHOTRIPSY     POLYPECTOMY  01/19/2021  Procedure: POLYPECTOMY;  Surgeon: Rogene Houston, MD;  [facility];  Service: Endoscopy;;     Allergies  Allergen Reactions   Meperidine Hcl Other (See Comments)    Was mixed with phenergan and turned blood vessels red.    Promethazine Hcl Other (See Comments)    Was mixed with the Demerol and caused veins to turn red.       Family History  Problem Relation Age of Onset   Diabetes Mother    Ovarian cancer Sister      Social History Mr. Sparling reports that he has been smoking cigarettes. He has a 20.00 pack-year smoking history. He has never used smokeless tobacco. Mr. Knoll reports no history of alcohol use.   Review of Systems CONSTITUTIONAL: No weight loss, fever, chills, weakness or fatigue.  HEENT: Eyes: No visual loss, blurred vision, double vision or yellow sclerae.No hearing loss, sneezing, congestion, runny nose or sore throat.  SKIN: No rash or itching.  CARDIOVASCULAR: per hpi RESPIRATORY: No shortness of breath, cough or sputum.  GASTROINTESTINAL: No anorexia, nausea, vomiting or diarrhea. No abdominal pain or blood.  GENITOURINARY: No burning on urination, no polyuria NEUROLOGICAL: No headache, dizziness, syncope, paralysis, ataxia, numbness or tingling in the extremities. No change in bowel or bladder control.  MUSCULOSKELETAL: No muscle, back pain, joint pain or stiffness.  LYMPHATICS: No enlarged nodes. No history of splenectomy.  PSYCHIATRIC: No history of  depression or anxiety.  ENDOCRINOLOGIC: No reports of sweating, cold or heat intolerance. No polyuria or polydipsia.  Marland Kitchen   Physical Examination Today's Vitals   03/22/22 0829  BP: (!) 142/80  Pulse: (!) 55  SpO2: 98%  Weight: 160 lb 6.4 oz (72.8 kg)  Height: '5\' 11"'$  (1.803 m)   Body mass index is 22.37 kg/m.  Gen: resting comfortably, no acute distress HEENT: no scleral icterus, pupils equal round and reactive, no palptable cervical adenopathy,  CV: RRR, no m/r/g, no jvd Resp: Clear to auscultation bilaterally GI: abdomen is soft, non-tender, non-distended, normal bowel sounds, no hepatosplenomegaly MSK: extremities are warm, no edema.  Skin: warm, no rash Neuro:  no focal deficits Psych: appropriate affect   Diagnostic Studies   09/2012 Echo: LVEF 55-60%, no WMAs,    09/2012 AAA [place]: 3.3 x 3.9 cm, stable from prior CT   08/2013 AAA [place] Infrarenal abdominal aortic aneurysm measuring 3.5 x 4.5 cm.   Previous examination of 10/06/2012 demonstrated the abdominal aorta   measuring 3.3 x 3.9 cm.   08/2014 AAA US IMPRESSION: 4.4 cm infrarenal abdominal aortic aneurysm. Recommend followup by [place] in 3 years. This recommendation follows ACR consensus guidelines: White Paper of the ACR Incidental Findings Committee II on Vascular Findings. J Am Coll Radiol 2013; 96:789-381    Assessment and Plan   1. CAD   - no symptoms, continue current meds   2. HTN   - bp elevated here, had been at goal at recent urology visit - come back 1 week for nursing bp check, if elevated would start norvasc '5mg'$  daily.    3. Hyperlipidemia - at goal, continue current meds   4. PVCs - asymptomatic, suspect related to history of CAD - continue metoprolol   4. AAA   -mild and stable, repeat US early next year.      Arnoldo Lenis, M.D.,

## 2022-03-22 NOTE — Patient Instructions (Signed)
Medication Instructions:  Your physician recommends that you continue on your current medications as directed. Please refer to the Current Medication list given to you today.   Labwork: None  Testing/Procedures: None  Follow-Up: Follow up in 6 months with Dr. Harl Bowie  Nurse Visit in 1 week- Blood Pressure Check.   Any Other Special Instructions Will Be Listed Below (If Applicable).     If you need a refill on your cardiac medications before your next appointment, please call your pharmacy.

## 2022-03-28 ENCOUNTER — Telehealth: Payer: Self-pay | Admitting: Cardiology

## 2022-03-28 MED ORDER — AMLODIPINE BESYLATE 5 MG PO TABS: 5.0000 mg | ORAL_TABLET | Freq: Every day | ORAL | 3 refills | Status: DC

## 2022-03-28 NOTE — Telephone Encounter (Signed)
Pt's wife notified and verbalized understanding.

## 2022-03-28 NOTE — Telephone Encounter (Signed)
New Message:    Wife called to report patient's blood pressure readings. She said Dr Harl Bowie and wanted him to keep a record of it and call back with the readings.    03-22-22-    151/67  03-23-22-    137/65  03-24-22       169/70   03-25-22       171/62  03-26-22-       172/64  03-27-22        174/67  03-28-22        179/78    Patient wants to know if he still need to come in tomorrow for a Nurse Visit. Patient said he does not want to pay for an office visit unless he have to.

## 2022-03-28 NOTE — Telephone Encounter (Signed)
Since has home bp's can cancel nursing visit. BP's too high, please start norvasc '5mg'$  daily and update [place] on bp's in 1 week  Zandra Abts MD

## 2022-03-29 ENCOUNTER — Telehealth: Payer: Self-pay | Admitting: Cardiology

## 2022-03-29 ENCOUNTER — Ambulatory Visit: Payer: Medicare HMO

## 2022-03-29 NOTE — Telephone Encounter (Signed)
Pt c/o medication issue:  1. Name of Medication:  New medication: amLODipine (NORVASC) 5 MG tablet  Was taking: metoprolol tartrate (LOPRESSOR) 25 MG tablet  2. How are you currently taking this medication (dosage and times per day)? Only took 1 tablet of amlodipine today  3. Are you having a reaction (difficulty breathing--STAT)? no  4. What is your medication issue? Patient's wife states the patient was given a new BP medication, but is not sure if he is supposed to keep taking the metoprolol as well.

## 2022-03-29 NOTE — Telephone Encounter (Signed)
Pt spouse notified that pt is to keep taking metoprolol

## 2022-04-09 ENCOUNTER — Telehealth: Payer: Self-pay | Admitting: Cardiology

## 2022-04-09 MED ORDER — AMLODIPINE BESYLATE 10 MG PO TABS: 10.0000 mg | ORAL_TABLET | Freq: Every day | ORAL | 3 refills | Status: DC

## 2022-04-09 NOTE — Telephone Encounter (Signed)
Calling to give his bp numbers, Please advise  9/29 166/644 9/30 150/74 10/1 162/90 04/2146/66 10/3 153/65 10/4 146/72 10/5 139/72 10/6 127/56 [id number]

## 2022-04-09 NOTE — Telephone Encounter (Signed)
I spoke with wife and they will increase Norvasc to 10 mg daily and call us back in 2 weeks with BP updates.

## 2022-04-09 NOTE — Telephone Encounter (Signed)
I will forward to Dr.Branch for review. 

## 2022-04-09 NOTE — Telephone Encounter (Signed)
BP's remain above goal, can he increase norvasc to '10mg'$  daily and update [place] 2 weeks.   Zandra Abts MD

## 2022-08-06 ENCOUNTER — Ambulatory Visit (HOSPITAL_COMMUNITY)
Admission: RE | Admit: 2022-08-06 | Discharge: 2022-08-06 | Disposition: A | Discharge status: Home / Self Care | Payer: Medicare HMO | Source: Ambulatory Visit | Attending: Urology | Admitting: Urology

## 2022-08-06 DIAGNOSIS — N200 Calculus of kidney: Secondary | ICD-10-CM | POA: Insufficient documentation

## 2022-08-06 DIAGNOSIS — I878 Other specified disorders of veins: Secondary | ICD-10-CM | POA: Diagnosis not present

## 2022-08-06 DIAGNOSIS — I700 Atherosclerosis of aorta: Secondary | ICD-10-CM | POA: Diagnosis not present

## 2022-08-09 ENCOUNTER — Ambulatory Visit (INDEPENDENT_AMBULATORY_CARE_PROVIDER_SITE_OTHER): Payer: Medicare HMO | Admitting: Urology

## 2022-08-09 VITALS — BP 136/74 | HR 55 | Ht 71.0 in | Wt 160.0 lb

## 2022-08-09 DIAGNOSIS — N138 Other obstructive and reflux uropathy: Secondary | ICD-10-CM

## 2022-08-09 DIAGNOSIS — Z87442 Personal history of urinary calculi: Secondary | ICD-10-CM

## 2022-08-09 DIAGNOSIS — R350 Frequency of micturition: Secondary | ICD-10-CM

## 2022-08-09 DIAGNOSIS — N401 Enlarged prostate with lower urinary tract symptoms: Secondary | ICD-10-CM

## 2022-08-09 DIAGNOSIS — R972 Elevated prostate specific antigen [PSA]: Secondary | ICD-10-CM | POA: Diagnosis not present

## 2022-08-09 DIAGNOSIS — N200 Calculus of kidney: Secondary | ICD-10-CM

## 2022-08-09 LAB — URINALYSIS, ROUTINE W REFLEX MICROSCOPIC
Bilirubin, UA: NEGATIVE
Glucose, UA: NEGATIVE
Ketones, UA: NEGATIVE
Nitrite, UA: NEGATIVE
Protein,UA: NEGATIVE
RBC, UA: NEGATIVE
Specific Gravity, UA: 1.01 (ref 1.005–1.030)
Urobilinogen, Ur: 0.2 mg/dL (ref 0.2–1.0)
pH, UA: 5.5 (ref 5.0–7.5)

## 2022-08-09 LAB — MICROSCOPIC EXAMINATION: Bacteria, UA: NONE SEEN

## 2022-08-09 MED ORDER — FINASTERIDE 5 MG PO TABS: 5.0000 mg | ORAL_TABLET | Freq: Every day | ORAL | 3 refills | Status: DC

## 2022-08-09 MED ORDER — TAMSULOSIN HCL 0.4 MG PO CAPS: 0.4000 mg | ORAL_CAPSULE | Freq: Every day | ORAL | 3 refills | Status: DC

## 2022-08-09 NOTE — Progress Notes (Signed)
Subjective:  1. Renal stones   2. BPH with urinary obstruction   3. Urinary frequency   4. Elevated PSA     [name] returns in f/u.  He had a cystolithalopaxy on 10/29/19 for a 2.6cm stone.  He is doing well without hematuria and minimal LUTS with an IPSS of 1.    He has some BPH with BOO and has had a good response to tamsulosin in the past but his LUTS haven't been too severe.    Pt is a 72YO male with h/o renal and bladder stones who presents with c/o 4 day h/o left sided abdominal pain radiating to the lower abdomen, gross hematuria and passage of clots. No dysuria, burning. Pt denies fever, chills, back pain, nausea, vomiting. Pt on ASA daily. Otherwise, has been doing well with LUTS on Flomax daily. Pt also requests change of Rxs as tamsolosin will become tier 2 with his new insurance. IPPS score=4 PVR=36m  CT shows stable renal stones with the largest in the LLP about 7-875m  No hydro is noted.  He has prostate enlargement but no obvious bladder lesions.  There is some interval enlargement in the AAA.    08/03/21: [name] today in f/u for cystoscopy for further evaluation of recent gross hematuria.   He has been on finasteride with the tamsulosin as the bleeding was presumed to be prostatic pending the cystoscopy.    02/01/22: Guy Neueturns today in f/u.  He passed a small stone Tuesday.  He has no pain currently or further gross hematuria.   His UA today has 6-10 RBC's and >30 RBC's with few bacteria.  He remains on tamsulosin and finasteride and is voiding well with an IPSS of 5.  Cysto in February for gross hematuria just showed trilobar hyperplasia and some grit in the bladder.   He has known renal stones.    His PSA is 0.6.   2/82/4: [name] in f/u.  He has passed a couple of small stones in the last 6 months.  He has had one episode of hematuria.  His UA today is clear. He is voiding ok with an IPSS of 6 with nocturia x 1 and some intermittency.   HE had a KUB that shows stable to  minimally increased 11-1237mLP stone.  He remains on tadalafil for ED and tamsulosin and finasteride for BPH.       IPSS     Row Name 08/09/22 0900         International Prostate Symptom Score   How often have you had the sensation of not emptying your bladder? Less than 1 in 5     How often have you had to urinate less than every two hours? Less than 1 in 5 times     How often have you found you stopped and started again several times when you urinated? Less than half the time     How often have you found it difficult to postpone urination? Not at All     How often have you had a weak urinary stream? Less than 1 in 5 times     How often have you had to strain to start urination? Not at All     How many times did you typically get up at night to urinate? 1 Time     Total IPSS Score 6       Quality of Life due to urinary symptoms   If you were to spend the rest  of your life with your urinary condition just the way it is now how would you feel about that? Pleased               ROS:  ROS:  A complete review of systems was performed.  All systems are negative except for pertinent findings as noted.   Review of Systems  All other systems reviewed and are negative.   Allergies  Allergen Reactions   Meperidine Hcl Other (See Comments)    Was mixed with phenergan and turned blood vessels red.    Promethazine Hcl Other (See Comments)    Was mixed with the Demerol and caused veins to turn red.     Outpatient Encounter Medications as of 08/09/2022  Medication Sig   acetaminophen (TYLENOL) 500 MG tablet Take 500 mg by mouth at bedtime.   amLODipine (NORVASC) 10 MG tablet Take 1 tablet (10 mg total) by mouth daily.   aspirin EC 81 MG tablet Take 1 tablet (81 mg total) by mouth daily.   atorvastatin (LIPITOR) 40 MG tablet TAKE 1 TABLET BY MOUTH EVERY DAY   cetirizine (ZYRTEC) 10 MG tablet Take 10 mg by mouth daily. Or zyzal   fluticasone (FLONASE) 50 MCG/ACT nasal spray Place 1  spray into both nostrils as needed for allergies or rhinitis.   levocetirizine (XYZAL) 5 MG tablet Take 1 tablet (5 mg total) by mouth every evening. (Patient taking differently: Take 5 mg by mouth every evening. Or zyrtec)   metFORMIN (GLUCOPHAGE) 1000 MG tablet Take 1,000 mg by mouth 2 (two) times daily.   metoprolol tartrate (LOPRESSOR) 25 MG tablet TAKE 1 TABLET BY MOUTH TWICE A DAY   nicotine (NICODERM CQ - DOSED IN MG/24 HOURS) 14 mg/24hr patch Place 1 patch (14 mg total) onto the skin daily. (Patient taking differently: Place 14 mg onto the skin daily. Occasionally)   ONETOUCH ULTRA test strip USE TO TEST SUGAR THREE TIMES DAILY   ramipril (ALTACE) 10 MG capsule TAKE 2 CAPSULES BY MOUTH EVERY DAY WITH BREAKFAST   tadalafil (CIALIS) 5 MG tablet Take 5 mg by mouth daily as needed for erectile dysfunction.   [DISCONTINUED] finasteride (PROSCAR) 5 MG tablet Take 1 tablet (5 mg total) by mouth daily.   [DISCONTINUED] tamsulosin (FLOMAX) 0.4 MG CAPS capsule Take 1 capsule (0.4 mg total) by mouth daily.   finasteride (PROSCAR) 5 MG tablet Take 1 tablet (5 mg total) by mouth daily.   tamsulosin (FLOMAX) 0.4 MG CAPS capsule Take 1 capsule (0.4 mg total) by mouth daily.   No facility-administered encounter medications on file as of 08/09/2022.    Past Medical History:  Diagnosis Date   AAA (abdominal aortic aneurysm) ([place])    3.3 cm in 2012   ABDOMINAL AORTIC ANEURYSM 11/07/2009   Diameter of 3.3 cm by CT in 12/2010    Anxiety    ASCVD (arteriosclerotic cardiovascular disease)    LAD stent in 1999; RCA bare-metal stent in 12/2007, LAD stent patent, 50-60% proximal Cx lesion treated medically; 09/2012: CABG x3, LIMA to LAD, Sequential SVG to ramus intermediate and OM branch with EVH via right thigh   Coronary artery disease    Depression    Diabetes mellitus    DIABETES MELLITUS, TYPE II 08/09/2010   no insulin     ED (erectile dysfunction)    Gastric ulcer    Hepatic steatosis     Hyperlipidemia    HYPERLIPIDEMIA 08/09/2010   Lipid profile in 07/2010:144, 124, 34, 86; 01/2012:93, 82,  44, 13    Hypertension    Insomnia    MI (myocardial infarction) ([place])    Nephrolithiasis    NEPHROLITHIASIS, RECURRENT 08/09/2010   Obstruction of the ureter on CT scan of the abdomen resulting in right hydronephrosis in 12/2010; bladder outlet obstruction prior to that.  CT also revealed atherosclerotic changes in the abdominal arterial vessels, hepatic granuloma and calcification of the prostate.    S/P CABG x 3 10/07/2012   LIMA to LAD, Sequential SVG to ramus intermediate and OM branch with EVH via right thigh   THROMBOCYTOPENIA 08/09/2010   Platelets 88-114,000 over the past 4 years; prior evaluation by Dr. Tressie Stalker without a specific etiology identified.  01/2012: Normal CBC ex platelets-120    Thrombocytopenia ([place])    Chronic; evaluated by Dr. Tressie Stalker in 2007   Tobacco abuse     Past Surgical History:  Procedure Laterality Date   COLONOSCOPY N/A 01/19/2021   Procedure: COLONOSCOPY;  Surgeon: Rogene Houston, MD;  [facility];  Service: Endoscopy;  Laterality: N/A;  12:30   CORONARY ANGIOPLASTY WITH STENT PLACEMENT     1999, 2009   CORONARY ARTERY BYPASS GRAFT N/A 10/07/2012   Procedure: CORONARY ARTERY BYPASS GRAFTING (CABG);  Surgeon: Rexene Alberts, MD;  Location: [facility];  Service: Open Heart Surgery;  Laterality: N/A;   CYSTOSCOPY WITH LITHOLAPAXY N/A 10/29/2019   Procedure: CYSTOSCOPY WITH LITHOLAPAXY;  Surgeon: Irine Seal, MD;  Location: [facility];  Service: Urology;  Laterality: N/A;   CYSTOSCOPY WITH RETROGRADE PYELOGRAM, URETEROSCOPY AND STENT PLACEMENT Left 08/30/2015   Procedure: CYSTOSCOPY LEFT RETROGRADE PYELOGRAM  LEFT   URETEROSCOPY,  LITHOLAPAXY;  Surgeon: Irine Seal, MD;  Location: [facility];  Service: Urology;  Laterality: Left;   ESOPHAGOGASTRODUODENOSCOPY  04/04/2012   Procedure: ESOPHAGOGASTRODUODENOSCOPY (EGD);  Surgeon: Rogene Houston, MD;  [facility];  Service: Endoscopy;  Laterality: N/A;  830   ESOPHAGOGASTRODUODENOSCOPY  07/25/2012   Procedure: ESOPHAGOGASTRODUODENOSCOPY (EGD);  Surgeon: Rogene Houston, MD;  [facility];  Service: Endoscopy;  Laterality: N/A;  830   HOLMIUM LASER APPLICATION Left [id number]   Procedure: HOLMIUM LASER APPLICATION;  Surgeon: Irine Seal, MD;  Location: [facility];  Service: Urology;  Laterality: Left;   INGUINAL HERNIA REPAIR     INTRAOPERATIVE TRANSESOPHAGEAL ECHOCARDIOGRAM N/A 10/07/2012   Procedure: INTRAOPERATIVE TRANSESOPHAGEAL ECHOCARDIOGRAM;  Surgeon: Rexene Alberts, MD;  Location: [facility];  Service: Open Heart Surgery;  Laterality: N/A;   LEFT HEART CATHETERIZATION WITH CORONARY ANGIOGRAM N/A 10/03/2012   Procedure: LEFT HEART CATHETERIZATION WITH CORONARY ANGIOGRAM;  Surgeon: Minus Breeding, MD;  Location: [facility];  Service: Cardiovascular;  Laterality: N/A;   LITHOTRIPSY     POLYPECTOMY  01/19/2021   Procedure: POLYPECTOMY;  Surgeon: Rogene Houston, MD;  [facility];  Service: Endoscopy;;    Social History   Socioeconomic History   Marital status: Married    Spouse name: Not on file   Number of children: Not on file   Years of education: Not on file   Highest education level: Not on file  Occupational History   Not on file  Tobacco Use   Smoking status: Every Day    Packs/day: 0.50    Years: 40.00    Total pack years: 20.00    Types: Cigarettes    Last attempt to quit: 07/09/2013    Years since quitting: 9.0   Smokeless tobacco: Never   Tobacco comments:    6-7 cigs.  daily   Vaping Use   Vaping Use: Never used  Substance and Sexual Activity   Alcohol use: No    Alcohol/week: 0.0 standard drinks of alcohol   Drug use: No   Sexual activity: Not on file  Other Topics Concern   Not on file  Social History Narrative   No regular exercise   Social Determinants of Health   Financial Resource Strain: Not on file  Food Insecurity: Not on file   Transportation Needs: Not on file  Physical Activity: Not on file  Stress: Not on file  Social Connections: Not on file  Intimate Partner Violence: Not on file    Family History  Problem Relation Age of Onset   Diabetes Mother    Ovarian cancer Sister        Objective: Vitals:   08/09/22 0926  BP: 136/74  Pulse: (!) 55     Physical Exam Vitals reviewed.  Constitutional:      Appearance: Normal appearance.  Neurological:     Mental Status: He is alert.     Lab Results:  Results for orders placed or performed in visit on 08/09/22 (from the past 24 hour(s))  Urinalysis, Routine w reflex microscopic     Status: Abnormal   Collection Time: 08/09/22 10:09 AM  Result Value Ref Range   Specific Gravity, UA 1.010 1.005 - 1.030   pH, UA 5.5 5.0 - 7.5   Color, UA Yellow Yellow   Appearance Ur Clear Clear   Leukocytes,UA Trace (A) Negative   Protein,UA Negative Negative/Trace   Glucose, UA Negative Negative   Ketones, UA Negative Negative   RBC, UA Negative Negative   Bilirubin, UA Negative Negative   Urobilinogen, Ur 0.2 0.2 - 1.0 mg/dL   Nitrite, UA Negative Negative   Microscopic Examination See below:    Narrative   Performed at:  [facility] [address], [place], [place]  [id number] Lab Director: Mina Marble MT, Phone:  [phone]  Microscopic Examination     Status: None   Collection Time: 08/09/22 10:09 AM   Urine  Result Value Ref Range   WBC, UA 0-5 0 - 5 /hpf   RBC, Urine 0-2 0 - 2 /hpf   Epithelial Cells (non renal) 0-10 0 - 10 /hpf   Bacteria, UA None seen None seen/Few   Narrative   Performed at:  [facility], [place] [employer], [place]  [id number] Lab Director: [place], Phone:  [phone]        BMET No results for input(s): "NA", "K", "CL", "CO2", "GLUCOSE", "BUN", "CREATININE", "CALCIUM" in the last 72 hours. PSA No results found for: "PSA" No results found for: "TESTOSTERONE" Lab  Results  Component Value Date   PSA1 0.6 01/25/2022   Cr was 1.05 on 11/22/21.   Studies/Results:    Assessment & Plan:  Nephrolithiasis.  He has passed a couple of small stones and has minimal change in the LLP stone. F/u in a year with a KUB.    BPH with BOO.  He will stay on tamsulosin and finasteride which I refilled.  PSA is suppressed on finasteride.  Repeat PSA in a year.  ED.  He is doing well on tadalafil.      Meds ordered this encounter  Medications   finasteride (PROSCAR) 5 MG tablet    Sig: Take 1 tablet (5 mg total) by mouth daily.    Dispense:  100 tablet    Refill:  3   tamsulosin (FLOMAX) 0.4 MG CAPS capsule    Sig: Take 1 capsule (0.4 mg total) by mouth daily.    Dispense:  100 capsule    Refill:  3      Orders Placed This Encounter  Procedures   Microscopic Examination   DG Abd 1 View    Standing Status:   Future    Standing Expiration Date:   08/10/2023    Order Specific Question:   Reason for Exam (SYMPTOM  OR DIAGNOSIS REQUIRED)    Answer:   renal stone    Order Specific Question:   Preferred imaging location?    Answer:   [facility]    Order Specific Question:   Radiology Contrast Protocol - do NOT remove file path    Answer:   [email or web address]   PSA, total and free    Standing Status:   Future    Standing Expiration Date:   08/10/2023   Urinalysis, Routine w reflex microscopic       Return in about 1 year (around 08/10/2023) for 1 year with KUB and PSA.   CC: Redmond School, MD      Irine Seal 08/10/2022 Patient ID: Tawanna Sat, male   DOB: 1951-01-30, 72 y.o.   MRN: [id number]

## 2022-08-10 ENCOUNTER — Encounter: Payer: Self-pay | Admitting: Urology

## 2022-08-24 ENCOUNTER — Telehealth: Payer: Self-pay | Admitting: *Deleted

## 2022-08-24 NOTE — Progress Notes (Unsigned)
  Care Coordination  Outreach Note  08/24/2022 Name: [name] MRN: [id number] DOB: [birth year]   Care Coordination Outreach Attempts: An unsuccessful telephone outreach was attempted today to offer the patient information about available care coordination services as a benefit of their health plan.   Follow Up Plan:  Additional outreach attempts will be made to offer the patient care coordination information and services.   Encounter Outcome:  No Answer  [place]  Direct Dial: [phone]

## 2022-08-27 DIAGNOSIS — R69 Illness, unspecified: Secondary | ICD-10-CM | POA: Diagnosis not present

## 2022-08-27 NOTE — Progress Notes (Signed)
  Care Coordination  Outreach Note  08/27/2022 Name: [name] MRN: [id number] DOB: [birth year]   Care Coordination Outreach Attempts: A second unsuccessful outreach was attempted today to offer the patient with information about available care coordination services as a benefit of their health plan.     Follow Up Plan:  Additional outreach attempts will be made to offer the patient care coordination information and services.   Encounter Outcome:  No Answer  [place]  Direct Dial: [phone]

## 2022-08-28 NOTE — Progress Notes (Signed)
  Care Coordination   Note   08/28/2022 Name: GIANI HANDELMAN MRN: [id number] DOB: 11/10/1950  SAMUL KREYLING is a 72 y.o. year old male who sees Redmond School, MD for primary care. I reached out to Tawanna Sat by phone today to offer care coordination services.  Mr. Crocco was given information about Care Coordination services today including:   The Care Coordination services include support from the care team which includes your Nurse Coordinator, Clinical Social Worker, or Pharmacist.  The Care Coordination team is here to help remove barriers to the health concerns and goals most important to you. Care Coordination services are voluntary, and the patient may decline or stop services at any time by request to their care team member.   Care Coordination Consent Status: Patient agreed to services and verbal consent obtained.   Follow up plan:  Telephone appointment with care coordination team member scheduled for:  09/14/22  Encounter Outcome:  Pt. Scheduled  [place]  Direct Dial: [phone]

## 2022-08-29 DIAGNOSIS — R69 Illness, unspecified: Secondary | ICD-10-CM | POA: Diagnosis not present

## 2022-08-30 ENCOUNTER — Ambulatory Visit
Admission: EM | Admit: 2022-08-30 | Discharge: 2022-08-30 | Disposition: A | Discharge status: Home / Self Care | Payer: Medicare HMO | Attending: Family Medicine | Admitting: Family Medicine

## 2022-08-30 ENCOUNTER — Other Ambulatory Visit: Payer: Self-pay

## 2022-08-30 ENCOUNTER — Encounter: Payer: Self-pay | Admitting: Emergency Medicine

## 2022-08-30 DIAGNOSIS — J069 Acute upper respiratory infection, unspecified: Secondary | ICD-10-CM

## 2022-08-30 DIAGNOSIS — Z1152 Encounter for screening for COVID-19: Secondary | ICD-10-CM | POA: Diagnosis not present

## 2022-08-30 MED ORDER — BENZONATATE 100 MG PO CAPS: 100.0000 mg | ORAL_CAPSULE | Freq: Three times a day (TID) | ORAL | 0 refills | Status: DC

## 2022-08-30 MED ORDER — FLUTICASONE PROPIONATE 50 MCG/ACT NA SUSP: 1.0000 | Freq: Two times a day (BID) | NASAL | 2 refills | Status: DC

## 2022-08-30 NOTE — Discharge Instructions (Signed)
You may take Coricidin HBP, plain Mucinex, use saline sinus rinses and over-the-counter pain relievers in addition to what I have sent over to help with your symptoms.  COVID test should be back tomorrow.

## 2022-08-30 NOTE — ED Triage Notes (Signed)
Pt reports sore throat, cough, nasal congestion. Denies any fevers, diarrhea, emesis.

## 2022-08-30 NOTE — ED Provider Notes (Signed)
[facility]    CSN: [id number] Arrival date & time: 08/30/22  [id number]      History   Chief Complaint Chief Complaint  Patient presents with   Sore Throat    HPI [name] is a 72 y.o. male.   Patient presenting today with several day history of sore throat, cough, congestion.  Denies fever, chills, body aches, chest pain, shortness of breath, abdominal pain, nausea vomiting or diarrhea.  So far not trying anything over-the-counter for symptoms other than Tylenol.  Several sick contacts recently but unsure with what.  No known history of chronic pulmonary disease.    Past Medical History:  Diagnosis Date   AAA (abdominal aortic aneurysm) ([place])    3.3 cm in 2012   ABDOMINAL AORTIC ANEURYSM 11/07/2009   Diameter of 3.3 cm by CT in 12/2010    Anxiety    ASCVD (arteriosclerotic cardiovascular disease)    LAD stent in 1999; RCA bare-metal stent in 12/2007, LAD stent patent, 50-60% proximal Cx lesion treated medically; 09/2012: CABG x3, LIMA to LAD, Sequential SVG to ramus intermediate and OM branch with EVH via right thigh   Coronary artery disease    Depression    Diabetes mellitus    DIABETES MELLITUS, TYPE II 08/09/2010   no insulin     ED (erectile dysfunction)    Gastric ulcer    Hepatic steatosis    Hyperlipidemia    HYPERLIPIDEMIA 08/09/2010   Lipid profile in 07/2010:144, 124, 34, 86; 01/2012:93, 28, 55, 43    Hypertension    Insomnia    MI (myocardial infarction) ([place])    Nephrolithiasis    NEPHROLITHIASIS, RECURRENT 08/09/2010   Obstruction of the ureter on CT scan of the abdomen resulting in right hydronephrosis in 12/2010; bladder outlet obstruction prior to that.  CT also revealed atherosclerotic changes in the abdominal arterial vessels, hepatic granuloma and calcification of the prostate.    S/P CABG x 3 10/07/2012   LIMA to LAD, Sequential SVG to ramus intermediate and OM branch with EVH via right thigh   THROMBOCYTOPENIA 08/09/2010   Platelets  88-114,000 over the past 4 years; prior evaluation by Dr. Tressie Stalker without a specific etiology identified.  01/2012: Normal CBC ex platelets-120    Thrombocytopenia ([place])    Chronic; evaluated by Dr. Tressie Stalker in 2007   Tobacco abuse     Patient Active Problem List   Diagnosis Date Noted   Left upper quadrant abdominal pain 06/15/2021   Gross hematuria 06/15/2021   BPH with urinary obstruction 06/15/2021   Weakness    CAP (community acquired pneumonia) 03/15/2021   Ureteral stone 07/09/2013   Hypertension 10/02/2012   Type 2 diabetes mellitus with hyperlipidemia ([place]) 08/09/2010   HYPERLIPIDEMIA 08/09/2010   THROMBOCYTOPENIA 08/09/2010   Arteriosclerotic cardiovascular disease (ASCVD) 08/09/2010   Renal stones 08/09/2010   ERECTILE DYSFUNCTION, ORGANIC 08/09/2010   INSOMNIA 08/09/2010   Abdominal aortic aneurysm ([place]) 11/07/2009    Past Surgical History:  Procedure Laterality Date   COLONOSCOPY N/A 01/19/2021   Procedure: COLONOSCOPY;  Surgeon: Rogene Houston, MD;  [facility];  Service: Endoscopy;  Laterality: N/A;  12:30   CORONARY ANGIOPLASTY WITH STENT PLACEMENT     1999, 2009   CORONARY ARTERY BYPASS GRAFT N/A 10/07/2012   Procedure: CORONARY ARTERY BYPASS GRAFTING (CABG);  Surgeon: Rexene Alberts, MD;  Location: [facility];  Service: Open Heart Surgery;  Laterality: N/A;   CYSTOSCOPY WITH LITHOLAPAXY N/A 10/29/2019   Procedure: CYSTOSCOPY  WITH LITHOLAPAXY;  Surgeon: Irine Seal, MD;  Location: [facility];  Service: Urology;  Laterality: N/A;   CYSTOSCOPY WITH RETROGRADE PYELOGRAM, URETEROSCOPY AND STENT PLACEMENT Left 08/30/2015   Procedure: CYSTOSCOPY LEFT RETROGRADE PYELOGRAM  LEFT   URETEROSCOPY,  LITHOLAPAXY;  Surgeon: Irine Seal, MD;  Location: [facility];  Service: Urology;  Laterality: Left;   ESOPHAGOGASTRODUODENOSCOPY  04/04/2012   Procedure: ESOPHAGOGASTRODUODENOSCOPY (EGD);  Surgeon: Rogene Houston, MD;  [facility];  Service: Endoscopy;  Laterality:  N/A;  830   ESOPHAGOGASTRODUODENOSCOPY  07/25/2012   Procedure: ESOPHAGOGASTRODUODENOSCOPY (EGD);  Surgeon: Rogene Houston, MD;  [facility];  Service: Endoscopy;  Laterality: N/A;  830   HOLMIUM LASER APPLICATION Left [id number]   Procedure: HOLMIUM LASER APPLICATION;  Surgeon: Irine Seal, MD;  Location: [facility];  Service: Urology;  Laterality: Left;   INGUINAL HERNIA REPAIR     INTRAOPERATIVE TRANSESOPHAGEAL ECHOCARDIOGRAM N/A 10/07/2012   Procedure: INTRAOPERATIVE TRANSESOPHAGEAL ECHOCARDIOGRAM;  Surgeon: Rexene Alberts, MD;  Location: [place];  Service: Open Heart Surgery;  Laterality: N/A;   LEFT HEART CATHETERIZATION WITH CORONARY ANGIOGRAM N/A 10/03/2012   Procedure: LEFT HEART CATHETERIZATION WITH CORONARY ANGIOGRAM;  Surgeon: Minus Breeding, MD;  [facility];  Service: Cardiovascular;  Laterality: N/A;   LITHOTRIPSY     POLYPECTOMY  01/19/2021   Procedure: POLYPECTOMY;  Surgeon: Rogene Houston, MD;  [facility];  Service: Endoscopy;;       Home Medications    Prior to Admission medications   Medication Sig Start Date End Date Taking? Authorizing Provider  benzonatate (TESSALON) 100 MG capsule Take 1 capsule (100 mg total) by mouth every 8 (eight) hours. 08/30/22  Yes Volney American, PA-C  fluticasone [facility]) 50 MCG/ACT nasal spray Place 1 spray into both nostrils 2 (two) times daily. 08/30/22  Yes Volney American, PA-C  acetaminophen (TYLENOL) 500 MG tablet Take 500 mg by mouth at bedtime.    [provider]  amLODipine (NORVASC) 10 MG tablet Take 1 tablet (10 mg total) by mouth daily. 04/09/22 04/09/23  Arnoldo Lenis, MD  aspirin EC 81 MG tablet Take 1 tablet (81 mg total) by mouth daily. 01/20/21   Rogene Houston, MD  atorvastatin (LIPITOR) 40 MG tablet TAKE 1 TABLET BY MOUTH EVERY DAY 02/26/22   Arnoldo Lenis, MD  cetirizine (ZYRTEC) 10 MG tablet Take 10 mg by mouth daily. Or zyzal    [provider]   finasteride (PROSCAR) 5 MG tablet Take 1 tablet (5 mg total) by mouth daily. 08/09/22   Irine Seal, MD  fluticasone (FLONASE) 50 MCG/ACT nasal spray Place 1 spray into both nostrils as needed for allergies or rhinitis.    [provider]  levocetirizine (XYZAL) 5 MG tablet Take 1 tablet (5 mg total) by mouth every evening. Patient taking differently: Take 5 mg by mouth every evening. Or zyrtec 08/20/21   Jaynee Eagles, PA-C  metFORMIN (GLUCOPHAGE) 1000 MG tablet Take 1,000 mg by mouth 2 (two) times daily.    [provider]  metoprolol tartrate (LOPRESSOR) 25 MG tablet TAKE 1 TABLET BY MOUTH TWICE A DAY 03/14/22   Arnoldo Lenis, MD  nicotine (NICODERM CQ - DOSED IN MG/24 HOURS) 14 mg/24hr patch Place 1 patch (14 mg total) onto the skin daily. Patient taking differently: Place 14 mg onto the skin daily. Occasionally 03/19/21   Barton Dubois, MD  [facility] ULTRA test strip USE TO TEST SUGAR THREE TIMES DAILY 12/21/20  [provider]  ramipril (ALTACE) 10 MG capsule TAKE 2 CAPSULES BY MOUTH EVERY DAY WITH BREAKFAST 03/08/22   Arnoldo Lenis, MD  tadalafil (CIALIS) 5 MG tablet Take 5 mg by mouth daily as needed for erectile dysfunction.    [provider]  tamsulosin (FLOMAX) 0.4 MG CAPS capsule Take 1 capsule (0.4 mg total) by mouth daily. 08/09/22   Irine Seal, MD    Family History Family History  Problem Relation Age of Onset   Diabetes Mother    Ovarian cancer Sister     Social History Social History   Tobacco Use   Smoking status: Every Day    Packs/day: 0.50    Years: 40.00    Total pack years: 20.00    Types: Cigarettes    Last attempt to quit: 07/09/2013    Years since quitting: 9.1   Smokeless tobacco: Never   Tobacco comments:    6-7 cigs. daily   Vaping Use   Vaping Use: Never used  Substance Use Topics   Alcohol use: No    Alcohol/week: 0.0 standard drinks of alcohol   Drug use: No   Allergies   Meperidine hcl and Promethazine  hcl  Review of Systems Review of Systems PER HPI  Physical Exam Triage Vital Signs ED Triage Vitals  Enc Vitals Group     BP 08/30/22 1131 (!) 112/57     Pulse Rate 08/30/22 1131 (!) 55     Resp 08/30/22 1131 20     Temp 08/30/22 1131 98.3 F (36.8 C)     Temp Source 08/30/22 1131 Oral     SpO2 08/30/22 1131 97 %     Weight --      Height --      Head Circumference --      Peak Flow --      Pain Score 08/30/22 1130 2     Pain Loc --      Pain Edu? --      Excl. in [place]? --    No data found.  Updated Vital Signs BP (!) 112/57 (BP Location: Right Arm)   Pulse (!) 55   Temp 98.3 F (36.8 C) (Oral)   Resp 20   SpO2 97%   Visual Acuity Right Eye Distance:   Left Eye Distance:   Bilateral Distance:    Right Eye Near:   Left Eye Near:    Bilateral Near:     Physical Exam Vitals and nursing note reviewed.  Constitutional:      Appearance: He is well-developed.  HENT:     Head: Atraumatic.     Right Ear: External ear normal.     Left Ear: External ear normal.     Nose: Rhinorrhea present.     Mouth/Throat:     Mouth: Mucous membranes are moist.     Pharynx: Oropharynx is clear. No oropharyngeal exudate or posterior oropharyngeal erythema.  Eyes:     Conjunctiva/sclera: Conjunctivae normal.     Pupils: Pupils are equal, round, and reactive to light.  Cardiovascular:     Rate and Rhythm: Normal rate and regular rhythm.  Pulmonary:     Effort: Pulmonary effort is normal. No respiratory distress.     Breath sounds: No wheezing or rales.  Musculoskeletal:        General: Normal range of motion.     Cervical back: Normal range of motion and neck supple.  Lymphadenopathy:     Cervical: No cervical adenopathy.  Skin:    General: Skin is warm and dry.  Neurological:     Mental Status: He is alert and oriented to person, place, and time.     Motor: No weakness.     Gait: Gait normal.  Psychiatric:        Mood and Affect: Mood normal.        Behavior: Behavior  normal.        Thought Content: Thought content normal.        Judgment: Judgment normal.    UC Treatments / Results  Labs (all labs ordered are listed, but only abnormal results are displayed) Labs Reviewed  SARS CORONAVIRUS 2 (TAT 6-24 HRS)   EKG   Radiology No results found.  Procedures Procedures (including critical care time)  Medications Ordered in UC Medications - No data to display  Initial Impression / Assessment and Plan / UC Course  I have reviewed the triage vital signs and the nursing notes.  Pertinent labs & imaging results that were available during my care of the patient were reviewed by me and considered in my medical decision making (see chart for details).     Vital signs and exam reassuring today and suggestive of a viral upper respiratory infection.  COVID testing pending, treat with Tessalon Perles, Flonase, supportive over-the-counter medications and home care.  Return for worsening symptoms.  Final Clinical Impressions(s) / UC Diagnoses   Final diagnoses:  Viral URI with cough     Discharge Instructions      You may take Coricidin HBP, plain Mucinex, use saline sinus rinses and over-the-counter pain relievers in addition to what I have sent over to help with your symptoms.  COVID test should be back tomorrow.    ED Prescriptions     Medication Sig Dispense Auth. Provider   benzonatate (TESSALON) 100 MG capsule Take 1 capsule (100 mg total) by mouth every 8 (eight) hours. 21 capsule Volney American, PA-C   fluticasone [facility]) 50 MCG/ACT nasal spray Place 1 spray into both nostrils 2 (two) times daily. 16 g Volney American, [place]      PDMP not reviewed this encounter.   Volney American, [place] 08/30/22 1215

## 2022-08-31 LAB — SARS CORONAVIRUS 2 (TAT 6-24 HRS): SARS Coronavirus 2: NEGATIVE

## 2022-09-03 DIAGNOSIS — R69 Illness, unspecified: Secondary | ICD-10-CM | POA: Diagnosis not present

## 2022-09-12 DIAGNOSIS — R69 Illness, unspecified: Secondary | ICD-10-CM | POA: Diagnosis not present

## 2022-09-14 ENCOUNTER — Ambulatory Visit: Payer: Self-pay | Admitting: *Deleted

## 2022-09-14 DIAGNOSIS — R69 Illness, unspecified: Secondary | ICD-10-CM | POA: Diagnosis not present

## 2022-09-14 NOTE — Patient Outreach (Signed)
  Care Coordination   09/14/2022 Name: [name] MRN: [id number] DOB: 15-Jan-1951   Care Coordination Outreach Attempts:  An unsuccessful telephone outreach was attempted for a scheduled appointment today.  Follow Up Plan:  Additional outreach attempts will be made to offer the patient care coordination information and services.   Encounter Outcome:  No Answer. Left HIPAA compliant VM   Care Coordination Interventions:  No, not indicated    Chong Sicilian, BSN, RN-BC RN Care Coordinator [place]: [phone] Main #: [phone]

## 2022-09-17 DIAGNOSIS — R69 Illness, unspecified: Secondary | ICD-10-CM | POA: Diagnosis not present

## 2022-09-18 ENCOUNTER — Telehealth: Payer: Self-pay | Admitting: *Deleted

## 2022-09-18 NOTE — Progress Notes (Signed)
  Care Coordination Note  09/18/2022 Name: [name] MRN: [id number] DOB: [birth year]  [name] is a 72 y.o. year old male who is a primary care patient of [place], [employer] and is actively engaged with the care management team. I reached out to Tawanna Sat by phone today to assist with re-scheduling an initial visit with the RN Case Manager  Follow up plan: Unsuccessful telephone outreach attempt made. A HIPAA compliant phone message was left for the patient providing contact information and requesting a return call.   [place]  Direct Dial: [phone]

## 2022-09-19 DIAGNOSIS — R69 Illness, unspecified: Secondary | ICD-10-CM | POA: Diagnosis not present

## 2022-09-20 NOTE — Progress Notes (Signed)
  Care Coordination Note  09/20/2022 Name: [name] MRN: [id number] DOB: July 26, 1950  [name] is a 72 y.o. year old male who is a primary care patient of [place], [employer] and is actively engaged with the care management team. I reached out to Tawanna Sat by phone today to assist with re-scheduling an initial visit with the RN Case Manager  Follow up plan: Unsuccessful telephone outreach attempt made. A HIPAA compliant phone message was left for the patient providing contact information and requesting a return call.  We have been unable to make contact with the patient for follow up. The care management team is available to follow up with the patient after provider conversation with the patient regarding recommendation for care management engagement and subsequent re-referral to the care management team.   [place]  Direct Dial: [phone]

## 2022-09-24 DIAGNOSIS — R69 Illness, unspecified: Secondary | ICD-10-CM | POA: Diagnosis not present

## 2022-09-25 ENCOUNTER — Ambulatory Visit: Payer: Medicare HMO | Admitting: Cardiology

## 2022-09-28 DIAGNOSIS — R69 Illness, unspecified: Secondary | ICD-10-CM | POA: Diagnosis not present

## 2022-10-01 DIAGNOSIS — R69 Illness, unspecified: Secondary | ICD-10-CM | POA: Diagnosis not present

## 2022-10-03 DIAGNOSIS — R69 Illness, unspecified: Secondary | ICD-10-CM | POA: Diagnosis not present

## 2022-10-05 DIAGNOSIS — R69 Illness, unspecified: Secondary | ICD-10-CM | POA: Diagnosis not present

## 2022-10-09 ENCOUNTER — Telehealth: Payer: Self-pay | Admitting: Cardiology

## 2022-10-09 MED ORDER — METOPROLOL TARTRATE 25 MG PO TABS: 25.0000 mg | ORAL_TABLET | Freq: Two times a day (BID) | ORAL | 0 refills | Status: AC

## 2022-10-09 NOTE — Telephone Encounter (Signed)
 *  STAT* If patient is at the pharmacy, call can be transferred to refill team.   1. Which medications need to be refilled? (please list name of each medication and dose if known) metoprolol tartrate (LOPRESSOR) 25 MG tablet   2. Which pharmacy/location (including street and city if local pharmacy) is medication to be sent to?  [id number] - [place], [place] - [address] AT [address]    3. Do they need a 30 day or 90 day supply? 90 days

## 2022-10-09 NOTE — Telephone Encounter (Signed)
Patient needs f/u apt, was not in recall list. I will provide 90 day supply, have messaged schedulers to make apt for pt.

## 2022-10-12 DIAGNOSIS — R69 Illness, unspecified: Secondary | ICD-10-CM | POA: Diagnosis not present

## 2022-10-15 DIAGNOSIS — R69 Illness, unspecified: Secondary | ICD-10-CM | POA: Diagnosis not present

## 2022-10-16 ENCOUNTER — Ambulatory Visit: Payer: Medicare HMO | Attending: Cardiology | Admitting: Cardiology

## 2022-10-16 ENCOUNTER — Encounter: Payer: Self-pay | Admitting: Cardiology

## 2022-10-16 VITALS — BP 130/60 | HR 82 | Ht 71.0 in | Wt 162.0 lb

## 2022-10-16 DIAGNOSIS — I493 Ventricular premature depolarization: Secondary | ICD-10-CM | POA: Diagnosis not present

## 2022-10-16 DIAGNOSIS — I714 Abdominal aortic aneurysm, without rupture, unspecified: Secondary | ICD-10-CM

## 2022-10-16 DIAGNOSIS — E782 Mixed hyperlipidemia: Secondary | ICD-10-CM | POA: Diagnosis not present

## 2022-10-16 DIAGNOSIS — I10 Essential (primary) hypertension: Secondary | ICD-10-CM | POA: Diagnosis not present

## 2022-10-16 DIAGNOSIS — I2510 Atherosclerotic heart disease of native coronary artery without angina pectoris: Secondary | ICD-10-CM

## 2022-10-16 NOTE — Patient Instructions (Signed)
Medication Instructions:  Your physician recommends that you continue on your current medications as directed. Please refer to the Current Medication list given to you today.  *If you need a refill on your cardiac medications before your next appointment, please call your pharmacy*   Lab Work: None If you have labs (blood work) drawn today and your tests are completely normal, you will receive your results only by: MyChart Message (if you have MyChart) OR A paper copy in the mail If you have any lab test that is abnormal or we need to change your treatment, we will call you to review the results.   Testing/Procedures: Abdominal Aortic Aneurysm Ultrasound    Follow-Up: At [facility] you and your health needs are our priority.  As part of our continuing mission to provide you with exceptional heart care, we have created designated Provider Care Teams.  These Care Teams include your primary Cardiologist (physician) and Advanced Practice Providers (APPs -  Physician Assistants and Nurse Practitioners) who all work together to provide you with the care you need, when you need it.  We recommend signing up for the patient portal called "MyChart".  Sign up information is provided on this After Visit Summary.  MyChart is used to connect with patients for Virtual Visits (Telemedicine).  Patients are able to view lab/test results, encounter notes, upcoming appointments, etc.  Non-urgent messages can be sent to your provider as well.   To learn more about what you can do with MyChart, go to [email or web address].    Your next appointment:   6 month(s)  Provider:   Dina Rich, MD    Other Instructions

## 2022-10-16 NOTE — Progress Notes (Signed)
Clinical Summary Mr. Hangartner is a 72 y.o.male seen today for follow up of the following medical problems       1. CAD   - prior 3 vessel CABG in 09/2012 (LIMA to LAD, SVG to ramus, SVG to OM), history of PCI prior to that. 09/2012 LVEF 60-65% by echo     - - no chest pains, no SOB/DOE -compliant withmeds     2. HTN    - compliant with meds - no recent orthostatic symptoms since off HCTZ.    - compliant with meds   3. HL   - 01/2022 TC 99 TG 84 HDL 38 LDL 44   4. AAA   - [place] 09/2013 3.5 x 4.5, slightly larger than prior study a year prior (3.3x3.9) - for repeat study in 2019   - no recent symptoms.    06/2019 4.3 cm aneurusm 06/2021 CT renal stone: 4.4 x 4.4 cm previously 4.3 x 3.9 cm.      5. PVCs -rare infrequent symptoms.      SH:  Retired Emergency planning/management officer.  On disabilty due to cardaic condition and chronic joint pains.   Past Medical History:  Diagnosis Date   AAA (abdominal aortic aneurysm)    3.3 cm in 2012   ABDOMINAL AORTIC ANEURYSM 11/07/2009   Diameter of 3.3 cm by CT in 12/2010    Anxiety    ASCVD (arteriosclerotic cardiovascular disease)    LAD stent in 1999; RCA bare-metal stent in 12/2007, LAD stent patent, 50-60% proximal Cx lesion treated medically; 09/2012: CABG x3, LIMA to LAD, Sequential SVG to ramus intermediate and OM Ilir Mahrt with EVH via right thigh   Coronary artery disease    Depression    Diabetes mellitus    DIABETES MELLITUS, TYPE II 08/09/2010   no insulin     ED (erectile dysfunction)    Gastric ulcer    Hepatic steatosis    Hyperlipidemia    HYPERLIPIDEMIA 08/09/2010   Lipid profile in 07/2010:144, 124, 34, 86; 01/2012:93, 82, 34, 43    Hypertension    Insomnia    MI (myocardial infarction)    Nephrolithiasis    NEPHROLITHIASIS, RECURRENT 08/09/2010   Obstruction of the ureter on CT scan of the abdomen resulting in right hydronephrosis in 12/2010; bladder outlet obstruction prior to that.  CT also revealed atherosclerotic changes  in the abdominal arterial vessels, hepatic granuloma and calcification of the prostate.    S/P CABG x 3 10/07/2012   LIMA to LAD, Sequential SVG to ramus intermediate and OM Vyom Brass with EVH via right thigh   Thrombocytopenia    Chronic; evaluated by Dr. Mariel Sleet in 2007   THROMBOCYTOPENIA 08/09/2010   Platelets 88-114,000 over the past 4 years; prior evaluation by Dr. Mariel Sleet without a specific etiology identified.  01/2012: Normal CBC ex platelets-120    Tobacco abuse      Allergies  Allergen Reactions   Meperidine Hcl Other (See Comments)    Was mixed with phenergan and turned blood vessels red.    Promethazine Hcl Other (See Comments)    Was mixed with the Demerol and caused veins to turn red.      Current Outpatient Medications  Medication Sig Dispense Refill   acetaminophen (TYLENOL) 500 MG tablet Take 500 mg by mouth at bedtime.     amLODipine (NORVASC) 10 MG tablet Take 1 tablet (10 mg total) by mouth daily. 90 tablet 3   aspirin EC 81 MG tablet Take 1  tablet (81 mg total) by mouth daily. 30 tablet 11   atorvastatin (LIPITOR) 40 MG tablet TAKE 1 TABLET BY MOUTH EVERY DAY 90 tablet 1   benzonatate (TESSALON) 100 MG capsule Take 1 capsule (100 mg total) by mouth every 8 (eight) hours. 21 capsule 0   cetirizine (ZYRTEC) 10 MG tablet Take 10 mg by mouth daily. Or zyzal     finasteride (PROSCAR) 5 MG tablet Take 1 tablet (5 mg total) by mouth daily. 100 tablet 3   fluticasone (FLONASE) 50 MCG/ACT nasal spray Place 1 spray into both nostrils 2 (two) times daily. 16 g 2   levocetirizine (XYZAL) 5 MG tablet Take 1 tablet (5 mg total) by mouth every evening. (Patient taking differently: Take 5 mg by mouth every evening. Or zyrtec) 90 tablet 0   metFORMIN (GLUCOPHAGE) 1000 MG tablet Take 1,000 mg by mouth 2 (two) times daily.     metoprolol tartrate (LOPRESSOR) 25 MG tablet Take 1 tablet (25 mg total) by mouth 2 (two) times daily. 180 tablet 0   ONETOUCH ULTRA test strip USE TO TEST  SUGAR THREE TIMES DAILY     ramipril (ALTACE) 10 MG capsule TAKE 2 CAPSULES BY MOUTH EVERY DAY WITH BREAKFAST 180 capsule 3   tadalafil (CIALIS) 5 MG tablet Take 5 mg by mouth daily as needed for erectile dysfunction.     tamsulosin (FLOMAX) 0.4 MG CAPS capsule Take 1 capsule (0.4 mg total) by mouth daily. 100 capsule 3   nicotine (NICODERM CQ - DOSED IN MG/24 HOURS) 14 mg/24hr patch Place 1 patch (14 mg total) onto the skin daily. (Patient not taking: Reported on 10/16/2022) 28 patch 0   No current facility-administered medications for this visit.     Past Surgical History:  Procedure Laterality Date   COLONOSCOPY N/A 01/19/2021   Procedure: COLONOSCOPY;  Surgeon: Malissa Hippo, MD;  Location: [facility];  Service: Endoscopy;  Laterality: N/A;  12:30   CORONARY ANGIOPLASTY WITH STENT PLACEMENT     1999, 2009   CORONARY ARTERY BYPASS GRAFT N/A 10/07/2012   Procedure: CORONARY ARTERY BYPASS GRAFTING (CABG);  Surgeon: Purcell Nails, MD;  Location: [facility] OR;  Service: Open Heart Surgery;  Laterality: N/A;   CYSTOSCOPY WITH LITHOLAPAXY N/A 10/29/2019   Procedure: CYSTOSCOPY WITH LITHOLAPAXY;  Surgeon: Bjorn Pippin, MD;  Location: [facility] ORS;  Service: Urology;  Laterality: N/A;   CYSTOSCOPY WITH RETROGRADE PYELOGRAM, URETEROSCOPY AND STENT PLACEMENT Left 08/30/2015   Procedure: CYSTOSCOPY LEFT RETROGRADE PYELOGRAM  LEFT   URETEROSCOPY,  LITHOLAPAXY;  Surgeon: Bjorn Pippin, MD;  Location: [facility];  Service: Urology;  Laterality: Left;   ESOPHAGOGASTRODUODENOSCOPY  04/04/2012   Procedure: ESOPHAGOGASTRODUODENOSCOPY (EGD);  Surgeon: Malissa Hippo, MD;  Location: [facility];  Service: Endoscopy;  Laterality: N/A;  830   ESOPHAGOGASTRODUODENOSCOPY  07/25/2012   Procedure: ESOPHAGOGASTRODUODENOSCOPY (EGD);  Surgeon: Malissa Hippo, MD;  Location: [facility];  Service: Endoscopy;  Laterality: N/A;  830   HOLMIUM LASER APPLICATION Left 08/30/2015   Procedure: HOLMIUM LASER APPLICATION;  Surgeon: Bjorn Pippin, MD;  Location: [facility];  Service: Urology;  Laterality: Left;   INGUINAL HERNIA REPAIR     INTRAOPERATIVE TRANSESOPHAGEAL ECHOCARDIOGRAM N/A 10/07/2012   Procedure: INTRAOPERATIVE TRANSESOPHAGEAL ECHOCARDIOGRAM;  Surgeon: Purcell Nails, MD;  Location: [facility] OR;  Service: Open Heart Surgery;  Laterality: N/A;   LEFT HEART CATHETERIZATION WITH CORONARY ANGIOGRAM N/A 10/03/2012   Procedure: LEFT HEART CATHETERIZATION WITH CORONARY ANGIOGRAM;  Surgeon: Rollene Rotunda, MD;  Location: [facility];  Service: Cardiovascular;  Laterality: N/A;   LITHOTRIPSY     POLYPECTOMY  01/19/2021   Procedure: POLYPECTOMY;  Surgeon: Malissa Hippo, MD;  Location: [facility];  Service: Endoscopy;;     Allergies  Allergen Reactions   Meperidine Hcl Other (See Comments)    Was mixed with phenergan and turned blood vessels red.    Promethazine Hcl Other (See Comments)    Was mixed with the Demerol and caused veins to turn red.       Family History  Problem Relation Age of Onset   Diabetes Mother    Ovarian cancer Sister      Social History Mr. Hedding reports that he has been smoking cigarettes. He has a 20.00 pack-year smoking history. He has never used smokeless tobacco. Mr. Climer reports no history of alcohol use.   Review of Systems CONSTITUTIONAL: No weight loss, fever, chills, weakness or fatigue.  HEENT: Eyes: No visual loss, blurred vision, double vision or yellow sclerae.No hearing loss, sneezing, congestion, runny nose or sore throat.  SKIN: No rash or itching.  CARDIOVASCULAR: per hpi RESPIRATORY: No shortness of breath, cough or sputum.  GASTROINTESTINAL: No anorexia, nausea, vomiting or diarrhea. No abdominal pain or blood.  GENITOURINARY: No burning on urination, no polyuria NEUROLOGICAL: No headache, dizziness, syncope, paralysis, ataxia, numbness or tingling in the extremities. No change in bowel or bladder control.  MUSCULOSKELETAL: No muscle, back pain, joint pain or stiffness.   LYMPHATICS: No enlarged nodes. No history of splenectomy.  PSYCHIATRIC: No history of depression or anxiety.  ENDOCRINOLOGIC: No reports of sweating, cold or heat intolerance. No polyuria or polydipsia.  Marland Kitchen   Physical Examination Today's Vitals   10/16/22 1418 10/16/22 1501  BP: 138/64 130/60  Pulse: 82   SpO2: 96%   Weight: 162 lb (73.5 kg)   Height:  (1.803 m)    Body mass index is 22.59 kg/m.  Gen: resting comfortably, no acute distress HEENT: no scleral icterus, pupils equal round and reactive, no palptable cervical adenopathy,  CV: RRR, no m/rg, no jvd Resp: Clear to auscultation bilaterally GI: abdomen is soft, non-tender, non-distended, normal bowel sounds, no hepatosplenomegaly MSK: extremities are warm, no edema.  Skin: warm, no rash Neuro:  no focal deficits Psych: appropriate affect   Diagnostic Studies  09/2012 Echo: LVEF 55-60%, no WMAs,    09/2012 AAA [place]: 3.3 x 3.9 cm, stable from prior CT   08/2013 AAA [place] Infrarenal abdominal aortic aneurysm measuring 3.5 x 4.5 cm.   Previous examination of 10/06/2012 demonstrated the abdominal aorta   measuring 3.3 x 3.9 cm.   08/2014 AAA US IMPRESSION: 4.4 cm infrarenal abdominal aortic aneurysm. Recommend followup by [place] in 3 years. This recommendation follows ACR consensus guidelines: White Paper of the ACR Incidental Findings Committee II on Vascular Findings. J Am Coll Radiol 2013; 82:956-213    Assessment and Plan  1. CAD   - no recent symptoms, continue current meds   2. HTN   - at goal, continue current meds   3. Hyperlipidemia - LDL at goal, continue currentmeds   4. PVCs - asymptomatic, suspect related to history of CAD -he will continue metoprolol   4. AAA   -we will repeat AAA [place]   F/u 6 months      Antoine Poche, M.D.

## 2022-10-17 DIAGNOSIS — R69 Illness, unspecified: Secondary | ICD-10-CM | POA: Diagnosis not present

## 2022-10-19 DIAGNOSIS — R69 Illness, unspecified: Secondary | ICD-10-CM | POA: Diagnosis not present

## 2022-10-22 DIAGNOSIS — R69 Illness, unspecified: Secondary | ICD-10-CM | POA: Diagnosis not present

## 2022-10-24 DIAGNOSIS — R69 Illness, unspecified: Secondary | ICD-10-CM | POA: Diagnosis not present

## 2022-10-25 ENCOUNTER — Ambulatory Visit (HOSPITAL_COMMUNITY)
Admission: RE | Admit: 2022-10-25 | Discharge: 2022-10-25 | Disposition: A | Discharge status: Home / Self Care | Payer: Medicare HMO | Source: Ambulatory Visit | Attending: Cardiology | Admitting: Cardiology

## 2022-10-25 DIAGNOSIS — I714 Abdominal aortic aneurysm, without rupture, unspecified: Secondary | ICD-10-CM

## 2022-10-26 DIAGNOSIS — R69 Illness, unspecified: Secondary | ICD-10-CM | POA: Diagnosis not present

## 2022-10-29 DIAGNOSIS — R69 Illness, unspecified: Secondary | ICD-10-CM | POA: Diagnosis not present

## 2022-10-31 DIAGNOSIS — R69 Illness, unspecified: Secondary | ICD-10-CM | POA: Diagnosis not present

## 2022-11-01 ENCOUNTER — Telehealth: Payer: Self-pay | Admitting: Cardiology

## 2022-11-01 NOTE — Telephone Encounter (Signed)
Wife called to follow-up on patient's test results. 

## 2022-11-01 NOTE — Telephone Encounter (Signed)
STable moderate aneurysm on [place], we will continue to monitor at this time.   Dominga Ferry MD      Wife notified, copied pcp

## 2022-11-01 NOTE — Telephone Encounter (Signed)
Wife states she was returning call. Please advise 

## 2022-11-02 DIAGNOSIS — R69 Illness, unspecified: Secondary | ICD-10-CM | POA: Diagnosis not present

## 2022-11-07 DIAGNOSIS — R69 Illness, unspecified: Secondary | ICD-10-CM | POA: Diagnosis not present

## 2022-11-09 DIAGNOSIS — R69 Illness, unspecified: Secondary | ICD-10-CM | POA: Diagnosis not present

## 2022-11-12 DIAGNOSIS — R69 Illness, unspecified: Secondary | ICD-10-CM | POA: Diagnosis not present

## 2022-11-16 DIAGNOSIS — R69 Illness, unspecified: Secondary | ICD-10-CM | POA: Diagnosis not present

## 2022-11-23 DIAGNOSIS — R69 Illness, unspecified: Secondary | ICD-10-CM | POA: Diagnosis not present

## 2022-11-28 DIAGNOSIS — R69 Illness, unspecified: Secondary | ICD-10-CM | POA: Diagnosis not present

## 2022-12-03 DIAGNOSIS — R69 Illness, unspecified: Secondary | ICD-10-CM | POA: Diagnosis not present

## 2022-12-05 DIAGNOSIS — R69 Illness, unspecified: Secondary | ICD-10-CM | POA: Diagnosis not present

## 2022-12-07 ENCOUNTER — Other Ambulatory Visit: Payer: Self-pay | Admitting: Family Medicine

## 2022-12-07 DIAGNOSIS — R69 Illness, unspecified: Secondary | ICD-10-CM | POA: Diagnosis not present

## 2022-12-07 NOTE — Telephone Encounter (Signed)
Unable to refill per protocol, last refill by another provider not at this practice.  Requested Prescriptions  Pending Prescriptions Disp Refills   fluticasone (FLONASE) 50 MCG/ACT nasal spray [Pharmacy Med Name: FLUTICASONE PROP 50 MCG SPRAY] 48 mL     Sig: PLACE 1 SPRAY INTO BOTH NOSTRILS 2 (TWO) TIMES DAILY     There is no refill protocol information for this order      

## 2022-12-10 DIAGNOSIS — R69 Illness, unspecified: Secondary | ICD-10-CM | POA: Diagnosis not present

## 2022-12-11 ENCOUNTER — Other Ambulatory Visit: Payer: Self-pay | Admitting: Family Medicine

## 2022-12-12 DIAGNOSIS — R69 Illness, unspecified: Secondary | ICD-10-CM | POA: Diagnosis not present

## 2022-12-12 NOTE — Telephone Encounter (Signed)
Urgent Care patient Requested Prescriptions  Pending Prescriptions Disp Refills  . fluticasone (FLONASE) 50 MCG/ACT nasal spray [Pharmacy Med Name: FLUTICASONE PROP 50 MCG SPRAY] 48 mL     Sig: PLACE 1 SPRAY INTO BOTH NOSTRILS 2 (TWO) TIMES DAILY     There is no refill protocol information for this order     

## 2022-12-14 DIAGNOSIS — R69 Illness, unspecified: Secondary | ICD-10-CM | POA: Diagnosis not present

## 2022-12-17 DIAGNOSIS — R69 Illness, unspecified: Secondary | ICD-10-CM | POA: Diagnosis not present

## 2022-12-19 DIAGNOSIS — R69 Illness, unspecified: Secondary | ICD-10-CM | POA: Diagnosis not present

## 2022-12-21 DIAGNOSIS — R69 Illness, unspecified: Secondary | ICD-10-CM | POA: Diagnosis not present

## 2022-12-24 DIAGNOSIS — R69 Illness, unspecified: Secondary | ICD-10-CM | POA: Diagnosis not present

## 2022-12-28 DIAGNOSIS — R69 Illness, unspecified: Secondary | ICD-10-CM | POA: Diagnosis not present

## 2022-12-31 DIAGNOSIS — R69 Illness, unspecified: Secondary | ICD-10-CM | POA: Diagnosis not present

## 2023-02-15 ENCOUNTER — Other Ambulatory Visit: Payer: Self-pay | Admitting: Cardiology

## 2023-02-21 DIAGNOSIS — E782 Mixed hyperlipidemia: Secondary | ICD-10-CM | POA: Diagnosis not present

## 2023-02-21 DIAGNOSIS — E1151 Type 2 diabetes mellitus with diabetic peripheral angiopathy without gangrene: Secondary | ICD-10-CM | POA: Diagnosis not present

## 2023-02-21 DIAGNOSIS — Z6822 Body mass index (BMI) 22.0-22.9, adult: Secondary | ICD-10-CM | POA: Diagnosis not present

## 2023-02-21 DIAGNOSIS — I2510 Atherosclerotic heart disease of native coronary artery without angina pectoris: Secondary | ICD-10-CM | POA: Diagnosis not present

## 2023-02-21 DIAGNOSIS — I10 Essential (primary) hypertension: Secondary | ICD-10-CM | POA: Diagnosis not present

## 2023-02-21 DIAGNOSIS — N400 Enlarged prostate without lower urinary tract symptoms: Secondary | ICD-10-CM | POA: Diagnosis not present

## 2023-02-21 DIAGNOSIS — Z0001 Encounter for general adult medical examination with abnormal findings: Secondary | ICD-10-CM | POA: Diagnosis not present

## 2023-02-21 DIAGNOSIS — Z1331 Encounter for screening for depression: Secondary | ICD-10-CM | POA: Diagnosis not present

## 2023-03-24 ENCOUNTER — Other Ambulatory Visit: Payer: Self-pay | Admitting: Cardiology

## 2023-05-13 ENCOUNTER — Emergency Department (HOSPITAL_COMMUNITY)
Admission: EM | Admit: 2023-05-13 | Discharge: 2023-05-13 | Disposition: A | Discharge status: Home / Self Care | Payer: Medicare HMO | Attending: Emergency Medicine | Admitting: Emergency Medicine

## 2023-05-13 ENCOUNTER — Encounter (HOSPITAL_COMMUNITY): Payer: Self-pay | Admitting: Emergency Medicine

## 2023-05-13 ENCOUNTER — Other Ambulatory Visit: Payer: Self-pay

## 2023-05-13 DIAGNOSIS — Z7984 Long term (current) use of oral hypoglycemic drugs: Secondary | ICD-10-CM | POA: Diagnosis not present

## 2023-05-13 DIAGNOSIS — E119 Type 2 diabetes mellitus without complications: Secondary | ICD-10-CM | POA: Diagnosis not present

## 2023-05-13 DIAGNOSIS — H01001 Unspecified blepharitis right upper eyelid: Secondary | ICD-10-CM | POA: Insufficient documentation

## 2023-05-13 DIAGNOSIS — Z7982 Long term (current) use of aspirin: Secondary | ICD-10-CM | POA: Insufficient documentation

## 2023-05-13 DIAGNOSIS — H5711 Ocular pain, right eye: Secondary | ICD-10-CM | POA: Diagnosis present

## 2023-05-13 MED ORDER — TETRACAINE HCL 0.5 % OP SOLN
2.0000 [drp] | Freq: Once | OPHTHALMIC | Status: AC
  Administered 2023-05-13: 2 [drp] via OPHTHALMIC
  Filled 2023-05-13: qty 4

## 2023-05-13 MED ORDER — ERYTHROMYCIN 5 MG/GM OP OINT
TOPICAL_OINTMENT | Freq: Once | OPHTHALMIC | Status: AC
  Filled 2023-05-13: qty 3.5

## 2023-05-13 MED ORDER — ERYTHROMYCIN 5 MG/GM OP OINT: TOPICAL_OINTMENT | OPHTHALMIC | 0 refills | Status: DC

## 2023-05-13 MED ORDER — FLUORESCEIN SODIUM 1 MG OP STRP
1.0000 | ORAL_STRIP | Freq: Once | OPHTHALMIC | Status: AC
  Administered 2023-05-13: 1 via OPHTHALMIC
  Filled 2023-05-13: qty 1

## 2023-05-13 NOTE — ED Notes (Signed)
Provider at bedside

## 2023-05-13 NOTE — ED Notes (Signed)
Pt able to walk from lobby to FT24 Pt stated that it feels like something is stuck under the lid of his RIGHT eye   Eye orders placed in system Waiting on provider

## 2023-05-13 NOTE — ED Notes (Signed)
Eye cart and meds outside room  Waiting on provider

## 2023-05-13 NOTE — ED Triage Notes (Addendum)
Pt c/o right eye pain. Pt states that they feel like something is under the right eyelid. Pt has tried flushing eye with no relief. Pt's right eye is swollen and pink at this time. Denies any injury to eye

## 2023-05-13 NOTE — Discharge Instructions (Signed)
As discussed, you must likely have an eyelid infection called blepharitis.  Apply warm compresses to your eyelid for 15 minutes 4x a day.  Use the erythromycin ointment on your upper eyelid as needed.  Get help right away if: You have pain that gets worse or spreads to other parts of your face. You have redness that gets worse or spreads to other parts of your face. You have changes in how you see (vision). You have pain when you look at lights or things that move. You have a fever.

## 2023-05-13 NOTE — ED Notes (Signed)
Visual acuity  RIGHT eye 20/10 LEFT eye 20/10 B/L 20/10

## 2023-05-13 NOTE — ED Provider Notes (Signed)
[place] EMERGENCY DEPARTMENT AT [facility] Provider Note   CSN: [id number] Arrival date & time: 05/13/23  1920     History  Chief Complaint  Patient presents with   Eye Pain    [name] is a 72 y.o. male with a history of ASCVD, MI, thrombocytopenia, and diabetes mellitus who presents the ED today for eye pain.  Patient reports that he has felt like something was under his right upper eyelid all day today with associated swelling of the right upper eyelid and redness to eyeball.  Denies any aggravating incidents prior to the onset of symptoms. He tried OTC eyedrops without relief.  Denies any blurred vision, double vision, loss of vision.  No additional complaints or concerns at this time.    Home Medications Prior to Admission medications   Medication Sig Start Date End Date Taking? Authorizing Provider  erythromycin ophthalmic ointment Place a 1/2 inch ribbon of ointment into the upper eyelid. 05/13/23  Yes Maxwell Marion, PA-C  acetaminophen (TYLENOL) 500 MG tablet Take 500 mg by mouth at bedtime.    [provider]  amLODipine (NORVASC) 10 MG tablet TAKE 1 TABLET BY MOUTH EVERY DAY 03/25/23   Antoine Poche, MD  aspirin EC 81 MG tablet Take 1 tablet (81 mg total) by mouth daily. 01/20/21   Malissa Hippo, MD  atorvastatin (LIPITOR) 40 MG tablet TAKE 1 TABLET BY MOUTH EVERY DAY 02/26/22   Antoine Poche, MD  benzonatate (TESSALON) 100 MG capsule Take 1 capsule (100 mg total) by mouth every 8 (eight) hours. 08/30/22   Particia Nearing, PA-C  cetirizine (ZYRTEC) 10 MG tablet Take 10 mg by mouth daily. Or zyzal    [provider]  finasteride (PROSCAR) 5 MG tablet Take 1 tablet (5 mg total) by mouth daily. 08/09/22   Bjorn Pippin, MD  fluticasone (FLONASE) 50 MCG/ACT nasal spray Place 1 spray into both nostrils 2 (two) times daily. 08/30/22   Particia Nearing, PA-C  levocetirizine (XYZAL) 5 MG tablet Take 1 tablet (5 mg total) by  mouth every evening. Patient taking differently: Take 5 mg by mouth every evening. Or zyrtec 08/20/21   Wallis Bamberg, PA-C  metFORMIN (GLUCOPHAGE) 1000 MG tablet Take 1,000 mg by mouth 2 (two) times daily.    [provider]  metoprolol tartrate (LOPRESSOR) 25 MG tablet Take 1 tablet (25 mg total) by mouth 2 (two) times daily. 10/09/22   Antoine Poche, MD  nicotine (NICODERM CQ - DOSED IN MG/24 HOURS) 14 mg/24hr patch Place 1 patch (14 mg total) onto the skin daily. Patient not taking: Reported on 10/16/2022 03/19/21   Vassie Loll, MD  [facility] ULTRA test strip USE TO TEST SUGAR THREE TIMES DAILY 12/21/20   [provider]  ramipril (ALTACE) 10 MG capsule TAKE 2 CAPSULES BY MOUTH EVERY DAY WITH BREAKFAST 02/15/23   Antoine Poche, MD  tadalafil (CIALIS) 5 MG tablet Take 5 mg by mouth daily as needed for erectile dysfunction.    [provider]  tamsulosin (FLOMAX) 0.4 MG CAPS capsule Take 1 capsule (0.4 mg total) by mouth daily. 08/09/22   Bjorn Pippin, MD      Allergies    Meperidine hcl and Promethazine hcl    Review of Systems   Review of Systems  Eyes:  Positive for pain.  All other systems reviewed and are negative.   Physical Exam Updated Vital Signs BP (!) 155/80 (BP Location: Right Arm)  Pulse 80   Temp 98.2 F (36.8 C) (Oral)   Resp 16   Ht 5\' 11"  (1.803 m)   Wt 72.6 kg   SpO2 97%   BMI 22.32 kg/m  Physical Exam Vitals and nursing note reviewed.  Constitutional:      General: He is not in acute distress.    Appearance: Normal appearance.  HENT:     Head: Normocephalic and atraumatic.     Mouth/Throat:     Mouth: Mucous membranes are moist.  Eyes:     General:        Right eye: Discharge present.     Extraocular Movements: Extraocular movements intact.     Pupils: Pupils are equal, round, and reactive to light.     Comments: Swelling and crusting present at the right eyelid with conjunctival injections of the right eye. Left eye is  unremarkable.  No foreign bodies present with eversion of the right upper eyelid.  No abrasions or ulcerations present with the woods lamp exam.  Visual acuity R eye: 20/10 L eye: 20/10  Bilateral: 20/10  Cardiovascular:     Rate and Rhythm: Normal rate and regular rhythm.     Pulses: Normal pulses.  Pulmonary:     Effort: Pulmonary effort is normal.     Breath sounds: Normal breath sounds.  Abdominal:     Palpations: Abdomen is soft.     Tenderness: There is no abdominal tenderness.  Skin:    General: Skin is warm and dry.     Findings: No rash.  Neurological:     General: No focal deficit present.     Mental Status: He is alert.  Psychiatric:        Mood and Affect: Mood normal.        Behavior: Behavior normal.     ED Results / Procedures / Treatments   Labs (all labs ordered are listed, but only abnormal results are displayed) Labs Reviewed - No data to display  EKG None  Radiology No results found.  Procedures Procedures: not indicated.   Medications Ordered in ED Medications  tetracaine (PONTOCAINE) 0.5 % ophthalmic solution 2 drop (2 drops Right Eye Given by Other 05/13/23 2241)  fluorescein ophthalmic strip 1 strip (1 strip Right Eye Given by Other 05/13/23 2241)  erythromycin ophthalmic ointment ( Right Eye Given 05/13/23 2258)    ED Course/ Medical Decision Making/ A&P                                 Medical Decision Making Risk Prescription drug management.   This patient presents to the ED for concern of right eye pain, this involves an extensive number of treatment options, and is a complaint that carries with it a high risk of complications and morbidity.   Differential diagnosis includes: corneal abrasion, corneal ulceration, conjunctivitis, blepharitis, chalazion, hordeolum,  dacryocystitis, etc.   Comorbidities  See HPI above   Additional History  Additional history obtained from prior records.   Problem List / ED Course /  Critical Interventions / Medication Management  Right eye irritation I ordered medications including: Tetracaine eye drops Erythromycin eye drops for blepharitis  I have reviewed the patients home medicines and have made adjustments as needed   Social Determinants of Health  Access to healthcare   Test / Admission - Considered  Discussed findings with patient. He is hemodynamically stable and safe for discharge home. Return precautions  provided.       Final Clinical Impression(s) / ED Diagnoses Final diagnoses:  Blepharitis of right upper eyelid, unspecified type    Rx / DC Orders ED Discharge Orders          Ordered    erythromycin ophthalmic ointment        05/13/23 2256              Maxwell Marion, PA-C 05/13/23 2318    Bethann Berkshire, MD 05/14/23 1224

## 2023-05-15 DIAGNOSIS — H25093 Other age-related incipient cataract, bilateral: Secondary | ICD-10-CM | POA: Diagnosis not present

## 2023-05-16 ENCOUNTER — Other Ambulatory Visit: Payer: Self-pay

## 2023-05-16 ENCOUNTER — Emergency Department (HOSPITAL_COMMUNITY)
Admission: EM | Admit: 2023-05-16 | Discharge: 2023-05-16 | Disposition: A | Discharge status: Home / Self Care | Payer: Medicare HMO | Attending: Emergency Medicine | Admitting: Emergency Medicine

## 2023-05-16 ENCOUNTER — Encounter (HOSPITAL_COMMUNITY): Payer: Self-pay

## 2023-05-16 ENCOUNTER — Emergency Department (HOSPITAL_COMMUNITY): Payer: Medicare HMO

## 2023-05-16 DIAGNOSIS — I6501 Occlusion and stenosis of right vertebral artery: Secondary | ICD-10-CM | POA: Diagnosis not present

## 2023-05-16 DIAGNOSIS — R519 Headache, unspecified: Secondary | ICD-10-CM

## 2023-05-16 DIAGNOSIS — E119 Type 2 diabetes mellitus without complications: Secondary | ICD-10-CM | POA: Insufficient documentation

## 2023-05-16 DIAGNOSIS — Z7982 Long term (current) use of aspirin: Secondary | ICD-10-CM | POA: Diagnosis not present

## 2023-05-16 DIAGNOSIS — I2510 Atherosclerotic heart disease of native coronary artery without angina pectoris: Secondary | ICD-10-CM | POA: Insufficient documentation

## 2023-05-16 DIAGNOSIS — Z79899 Other long term (current) drug therapy: Secondary | ICD-10-CM | POA: Diagnosis not present

## 2023-05-16 DIAGNOSIS — Z7984 Long term (current) use of oral hypoglycemic drugs: Secondary | ICD-10-CM | POA: Insufficient documentation

## 2023-05-16 DIAGNOSIS — I6523 Occlusion and stenosis of bilateral carotid arteries: Secondary | ICD-10-CM | POA: Diagnosis not present

## 2023-05-16 DIAGNOSIS — R112 Nausea with vomiting, unspecified: Secondary | ICD-10-CM | POA: Diagnosis not present

## 2023-05-16 LAB — COMPREHENSIVE METABOLIC PANEL
ALT: 23 U/L (ref 0–44)
AST: 13 U/L — ABNORMAL LOW (ref 15–41)
Albumin: 4.4 g/dL (ref 3.5–5.0)
Alkaline Phosphatase: 63 U/L (ref 38–126)
Anion gap: 11 (ref 5–15)
BUN: 21 mg/dL (ref 8–23)
CO2: 24 mmol/L (ref 22–32)
Calcium: 9.4 mg/dL (ref 8.9–10.3)
Chloride: 103 mmol/L (ref 98–111)
Creatinine, Ser: 0.99 mg/dL (ref 0.61–1.24)
GFR, Estimated: >60 mL/min (ref >60)
Glucose, Bld: 135 mg/dL — ABNORMAL HIGH (ref 70–99)
Potassium: 4.1 mmol/L (ref 3.5–5.1)
Sodium: 138 mmol/L (ref 135–145)
Total Bilirubin: 0.7 mg/dL (ref <1.2)
Total Protein: 7.3 g/dL (ref 6.5–8.1)

## 2023-05-16 LAB — CBC WITH DIFFERENTIAL/PLATELET
Abs Immature Granulocytes: 0.03 10*3/uL (ref 0.00–0.07)
Basophils Absolute: 0 10*3/uL (ref 0.0–0.1)
Basophils Relative: 0 %
Eosinophils Absolute: 0.1 10*3/uL (ref 0.0–0.5)
Eosinophils Relative: 1 %
HCT: 43.8 % (ref 39.0–52.0)
Hemoglobin: 14.9 g/dL (ref 13.0–17.0)
Immature Granulocytes: 0 %
Lymphocytes Relative: 13 %
Lymphs Abs: 1 10*3/uL (ref 0.7–4.0)
MCH: 30.1 pg (ref 26.0–34.0)
MCHC: 34 g/dL (ref 30.0–36.0)
MCV: 88.5 fL (ref 80.0–100.0)
Monocytes Absolute: 0.5 10*3/uL (ref 0.1–1.0)
Monocytes Relative: 6 %
Neutro Abs: 6.2 10*3/uL (ref 1.7–7.7)
Neutrophils Relative %: 80 %
Platelets: 125 10*3/uL — ABNORMAL LOW (ref 150–400)
RBC: 4.95 MIL/uL (ref 4.22–5.81)
RDW: 12.4 % (ref 11.5–15.5)
WBC: 7.9 10*3/uL (ref 4.0–10.5)
nRBC: 0 % (ref 0.0–0.2)

## 2023-05-16 MED ORDER — HYDROMORPHONE HCL 1 MG/ML IJ SOLN
0.5000 mg | Freq: Once | INTRAMUSCULAR | Status: AC
  Administered 2023-05-16: 0.5 mg via INTRAVENOUS
  Filled 2023-05-16: qty 0.5

## 2023-05-16 MED ORDER — PROCHLORPERAZINE EDISYLATE 10 MG/2ML IJ SOLN
10.0000 mg | Freq: Once | INTRAMUSCULAR | Status: AC
  Administered 2023-05-16: 10 mg via INTRAVENOUS
  Filled 2023-05-16: qty 2

## 2023-05-16 MED ORDER — DIPHENHYDRAMINE HCL 50 MG/ML IJ SOLN
50.0000 mg | Freq: Once | INTRAMUSCULAR | Status: AC
Start: 2023-05-16 — End: 2023-05-16
  Administered 2023-05-16: 50 mg via INTRAVENOUS
  Filled 2023-05-16: qty 1

## 2023-05-16 MED ORDER — OXYCODONE-ACETAMINOPHEN 5-325 MG PO TABS: 1.0000 | ORAL_TABLET | Freq: Four times a day (QID) | ORAL | 0 refills | Status: DC | PRN

## 2023-05-16 MED ORDER — KETOROLAC TROMETHAMINE 30 MG/ML IJ SOLN
30.0000 mg | Freq: Once | INTRAMUSCULAR | Status: AC
  Administered 2023-05-16: 30 mg via INTRAVENOUS
  Filled 2023-05-16: qty 1

## 2023-05-16 MED ORDER — IOHEXOL 350 MG/ML SOLN
100.0000 mL | Freq: Once | INTRAVENOUS | Status: AC | PRN
  Administered 2023-05-16: 100 mL via INTRAVENOUS

## 2023-05-16 NOTE — Discharge Instructions (Signed)
Follow-up with your hide doctor next week.  Follow-up with the neurologist you have been referred to.  And follow-up with your family doctor if any other problems

## 2023-05-16 NOTE — ED Triage Notes (Signed)
Pt c/o headache and dizziness since Monday. Pt states rt eye pain as well. Pt was seen Monday night in the ED and dx with a eye infection and was informed if eye not any better in a couple of days to f/u with ophthalmology. Pt states seeing the ophthalmologist yesterday and they "dug into my eye" saying they thought something was embedded in it but than decided there was not anything in there.

## 2023-05-16 NOTE — ED Notes (Signed)
Patient transported to CT 

## 2023-05-16 NOTE — ED Notes (Signed)
Pt states pain medication "helped but not much" and requests more pain medication.

## 2023-05-16 NOTE — ED Provider Notes (Signed)
[place] EMERGENCY DEPARTMENT AT [facility] Provider Note   CSN: [id number] Arrival date & time: 05/16/23  6387     History  Chief Complaint  Patient presents with   Headache    [name] is a 72 y.o. male.  Patient has a history of diabetes and coronary artery disease.  He complains of a headache.  He was seen recently for eye problems and has been seen by an eye doctor  The history is provided by the patient and medical records. No language interpreter was used.  Headache Pain location:  Generalized Quality:  Dull Radiates to:  Does not radiate Severity currently:  8/10 Severity at highest:  9/10 Onset quality:  Sudden Timing:  Constant Progression:  Worsening Chronicity:  New Similar to prior headaches: no   Context: not activity   Relieved by:  Nothing Worsened by:  Nothing Ineffective treatments:  None tried Associated symptoms: no abdominal pain, no back pain, no congestion, no cough, no diarrhea, no fatigue, no seizures and no sinus pressure        Home Medications Prior to Admission medications   Medication Sig Start Date End Date Taking? Authorizing Provider  oxyCODONE-acetaminophen (PERCOCET/ROXICET) 5-325 MG tablet Take 1 tablet by mouth every 6 (six) hours as needed for severe pain (pain score 7-10). 05/16/23  Yes Bethann Berkshire, MD  acetaminophen (TYLENOL) 500 MG tablet Take 500 mg by mouth at bedtime.    [provider]  amLODipine (NORVASC) 10 MG tablet TAKE 1 TABLET BY MOUTH EVERY DAY 03/25/23   Antoine Poche, MD  aspirin EC 81 MG tablet Take 1 tablet (81 mg total) by mouth daily. 01/20/21   Malissa Hippo, MD  atorvastatin (LIPITOR) 40 MG tablet TAKE 1 TABLET BY MOUTH EVERY DAY 02/26/22   Antoine Poche, MD  benzonatate (TESSALON) 100 MG capsule Take 1 capsule (100 mg total) by mouth every 8 (eight) hours. 08/30/22   Particia Nearing, PA-C  cetirizine (ZYRTEC) 10 MG tablet Take 10 mg by mouth daily. Or zyzal     [provider]  erythromycin ophthalmic ointment Place a 1/2 inch ribbon of ointment into the upper eyelid. 05/13/23   Maxwell Marion, PA-C  finasteride (PROSCAR) 5 MG tablet Take 1 tablet (5 mg total) by mouth daily. 08/09/22   Bjorn Pippin, MD  fluticasone (FLONASE) 50 MCG/ACT nasal spray Place 1 spray into both nostrils 2 (two) times daily. 08/30/22   Particia Nearing, PA-C  levocetirizine (XYZAL) 5 MG tablet Take 1 tablet (5 mg total) by mouth every evening. Patient taking differently: Take 5 mg by mouth every evening. Or zyrtec 08/20/21   Wallis Bamberg, PA-C  metFORMIN (GLUCOPHAGE) 1000 MG tablet Take 1,000 mg by mouth 2 (two) times daily.    [provider]  metoprolol tartrate (LOPRESSOR) 25 MG tablet Take 1 tablet (25 mg total) by mouth 2 (two) times daily. 10/09/22   Antoine Poche, MD  nicotine (NICODERM CQ - DOSED IN MG/24 HOURS) 14 mg/24hr patch Place 1 patch (14 mg total) onto the skin daily. Patient not taking: Reported on 10/16/2022 03/19/21   Vassie Loll, MD  [facility] ULTRA test strip USE TO TEST SUGAR THREE TIMES DAILY 12/21/20   [provider]  ramipril (ALTACE) 10 MG capsule TAKE 2 CAPSULES BY MOUTH EVERY DAY WITH BREAKFAST 02/15/23   Antoine Poche, MD  tadalafil (CIALIS) 5 MG tablet Take 5 mg by mouth daily as needed for erectile dysfunction.  [provider]  tamsulosin (FLOMAX) 0.4 MG CAPS capsule Take 1 capsule (0.4 mg total) by mouth daily. 08/09/22   Bjorn Pippin, MD      Allergies    Meperidine hcl and Promethazine hcl    Review of Systems   Review of Systems  Constitutional:  Negative for appetite change and fatigue.  HENT:  Negative for congestion, ear discharge and sinus pressure.   Eyes:  Negative for discharge.  Respiratory:  Negative for cough.   Cardiovascular:  Negative for chest pain.  Gastrointestinal:  Negative for abdominal pain and diarrhea.  Genitourinary:  Negative for frequency and hematuria.   Musculoskeletal:  Negative for back pain.  Skin:  Negative for rash.  Neurological:  Positive for headaches. Negative for seizures.  Psychiatric/Behavioral:  Negative for hallucinations.     Physical Exam Updated Vital Signs BP (!) 164/69   Pulse (!) 53   Temp 97.7 F (36.5 C) (Oral)   Resp 16   Ht 5\' 11"  (1.803 m)   Wt 72.6 kg   SpO2 97%   BMI 22.31 kg/m  Physical Exam Vitals and nursing note reviewed.  Constitutional:      Appearance: He is well-developed.  HENT:     Head: Normocephalic.     Nose: Nose normal.  Eyes:     General: No scleral icterus.    Conjunctiva/sclera: Conjunctivae normal.  Neck:     Thyroid: No thyromegaly.  Cardiovascular:     Rate and Rhythm: Normal rate and regular rhythm.     Heart sounds: No murmur heard.    No friction rub. No gallop.  Pulmonary:     Breath sounds: No stridor. No wheezing or rales.  Chest:     Chest wall: No tenderness.  Abdominal:     General: There is no distension.     Tenderness: There is no abdominal tenderness. There is no rebound.  Musculoskeletal:        General: Normal range of motion.     Cervical back: Neck supple.  Lymphadenopathy:     Cervical: No cervical adenopathy.  Skin:    Findings: No erythema or rash.  Neurological:     Mental Status: He is alert and oriented to person, place, and time.     Motor: No abnormal muscle tone.     Coordination: Coordination normal.  Psychiatric:        Behavior: Behavior normal.     ED Results / Procedures / Treatments   Labs (all labs ordered are listed, but only abnormal results are displayed) Labs Reviewed  CBC WITH DIFFERENTIAL/PLATELET - Abnormal; Notable for the following components:      Result Value   Platelets 125 (*)    All other components within normal limits  COMPREHENSIVE METABOLIC PANEL - Abnormal; Notable for the following components:   Glucose, Bld 135 (*)    AST 13 (*)    All other components within normal limits     EKG None  Radiology CT ANGIO HEAD NECK W WO CM  Result Date: 05/16/2023 CLINICAL DATA:  Headache for 3 days, nausea and vomiting EXAM: CT ANGIOGRAPHY HEAD AND NECK WITH AND WITHOUT CONTRAST TECHNIQUE: Multidetector CT imaging of the head and neck was performed using the standard protocol during bolus administration of intravenous contrast. Multiplanar CT image reconstructions and MIPs were obtained to evaluate the vascular anatomy. Carotid stenosis measurements (when applicable) are obtained utilizing NASCET criteria, using the distal internal carotid diameter as the denominator. RADIATION DOSE REDUCTION: This  exam was performed according to the departmental dose-optimization program which includes automated exposure control, adjustment of the mA and/or kV according to patient size and/or use of iterative reconstruction technique. CONTRAST:  OMNIPAQUE IOHEXOL 350 MG/ML SOLN COMPARISON:  No prior CTA available correlation is made with CT head 03/15/2021 and CT neck 11/22/2021 FINDINGS: CT HEAD FINDINGS Brain: No evidence of acute infarct, hemorrhage, mass, mass effect, or midline shift. No hydrocephalus or extra-axial fluid collection. Vascular: No hyperdense vessel. Skull: Negative for fracture or focal lesion. Sinuses/Orbits: No acute finding. Other: The mastoid air cells are well aerated. CTA NECK FINDINGS Aortic arch: Two-vessel arch with a common origin of the brachiocephalic and left common carotid arteries. Imaged portion shows no evidence of aneurysm or dissection. No significant stenosis of the major arch vessel origins. Aortic atherosclerosis. Right carotid system: No evidence of dissection, occlusion, or hemodynamically significant stenosis (greater than 50%). Atherosclerotic disease at the bifurcation and in the proximal ICA is not hemodynamically significant. Left carotid system: No evidence of dissection, occlusion, or hemodynamically significant stenosis (greater than 50%).  Atherosclerotic disease at the bifurcation and in the proximal ICA is not hemodynamically significant. Vertebral arteries: Moderate stenosis at the origin of the right vertebral artery, which is dominant. The vertebral arteries are otherwise patent to the skull base, without additional stenosis or evidence of dissection. Skeleton: No acute osseous abnormality. Degenerative changes in the cervical spine. A status post median sternotomy. Other neck: No acute finding. Upper chest: No focal pulmonary opacity or pleural effusion. Review of the MIP images confirms the above findings CTA HEAD FINDINGS Anterior circulation: Both internal carotid arteries are patent to the termini, with calcifications but without significant stenosis. Patent right A1. Hypoplastic or aplastic left A1. Normal anterior communicating artery. Anterior cerebral arteries are patent to their distal aspects without significant stenosis. No M1 stenosis or occlusion. MCA branches perfused to their distal aspects without significant stenosis. Posterior circulation: Vertebral arteries patent to the vertebrobasilar junction without significant stenosis. Posterior inferior cerebellar arteries patent proximally. Basilar patent to its distal aspect without significant stenosis. Superior cerebellar arteries patent proximally. Patent P1 segments. PCAs perfused to their distal aspects without significant stenosis. The right posterior communicating artery is patent. Venous sinuses: As permitted by contrast timing, patent. Anatomic variants: None significant. No evidence of aneurysm or vascular malformation. Review of the MIP images confirms the above findings IMPRESSION: 1. No acute intracranial process. 2. No intracranial large vessel occlusion or significant stenosis. 3. Moderate stenosis at the origin of the right vertebral artery, which is dominant. No other hemodynamically significant stenosis in the neck. 4. Aortic atherosclerosis. Aortic Atherosclerosis  (ICD10-I70.0). Electronically Signed   By: Wiliam Ke M.D.   On: 05/16/2023 11:25    Procedures Procedures    Medications Ordered in ED Medications  HYDROmorphone (DILAUDID) injection 0.5 mg (0.5 mg Intravenous Given 05/16/23 0907)  HYDROmorphone (DILAUDID) injection 0.5 mg (0.5 mg Intravenous Given 05/16/23 1111)  iohexol (OMNIPAQUE) 350 MG/ML injection 100 mL (100 mLs Intravenous Contrast Given 05/16/23 1037)  ketorolac (TORADOL) 30 MG/ML injection 30 mg (30 mg Intravenous Given 05/16/23 1236)  prochlorperazine (COMPAZINE) injection 10 mg (10 mg Intravenous Given 05/16/23 1235)  diphenhydrAMINE (BENADRYL) injection 50 mg (50 mg Intravenous Given 05/16/23 1236)    ED Course/ Medical Decision Making/ A&P                                 Medical Decision Making  Amount and/or Complexity of Data Reviewed Labs: ordered. Radiology: ordered.  Risk Prescription drug management.   Patient with persistent headache and normal CT angio of the head.  He is sent home on some pain medicines and will follow-up with neurology.  He will also follow-up with the eye doctor this on the other day        Final Clinical Impression(s) / ED Diagnoses Final diagnoses:  Bad headache    Rx / DC Orders ED Discharge Orders          Ordered    Ambulatory referral to Neurology       Comments: An appointment is requested in approximately: 2 weeks   05/16/23 1408    oxyCODONE-acetaminophen (PERCOCET/ROXICET) 5-325 MG tablet  Every 6 hours PRN        05/16/23 1409              Bethann Berkshire, MD 05/17/23 [phone]

## 2023-05-17 ENCOUNTER — Encounter (HOSPITAL_COMMUNITY): Payer: Self-pay | Admitting: Emergency Medicine

## 2023-05-17 ENCOUNTER — Other Ambulatory Visit: Payer: Self-pay

## 2023-05-17 ENCOUNTER — Emergency Department (HOSPITAL_COMMUNITY)
Admission: EM | Admit: 2023-05-17 | Discharge: 2023-05-17 | Disposition: A | Discharge status: Home / Self Care | Payer: Medicare HMO | Attending: Emergency Medicine | Admitting: Emergency Medicine

## 2023-05-17 DIAGNOSIS — B0230 Zoster ocular disease, unspecified: Secondary | ICD-10-CM | POA: Diagnosis not present

## 2023-05-17 DIAGNOSIS — Z79899 Other long term (current) drug therapy: Secondary | ICD-10-CM | POA: Insufficient documentation

## 2023-05-17 DIAGNOSIS — Z7984 Long term (current) use of oral hypoglycemic drugs: Secondary | ICD-10-CM | POA: Insufficient documentation

## 2023-05-17 DIAGNOSIS — B0231 Zoster conjunctivitis: Secondary | ICD-10-CM | POA: Diagnosis not present

## 2023-05-17 DIAGNOSIS — Z7982 Long term (current) use of aspirin: Secondary | ICD-10-CM | POA: Insufficient documentation

## 2023-05-17 DIAGNOSIS — R519 Headache, unspecified: Secondary | ICD-10-CM | POA: Diagnosis present

## 2023-05-17 DIAGNOSIS — B029 Zoster without complications: Secondary | ICD-10-CM | POA: Diagnosis not present

## 2023-05-17 DIAGNOSIS — B0239 Other herpes zoster eye disease: Secondary | ICD-10-CM | POA: Diagnosis not present

## 2023-05-17 MED ORDER — VALACYCLOVIR HCL 500 MG PO TABS
1000.0000 mg | ORAL_TABLET | Freq: Once | ORAL | Status: AC
  Administered 2023-05-17: 1000 mg via ORAL
  Filled 2023-05-17: qty 2

## 2023-05-17 MED ORDER — TETRACAINE HCL 0.5 % OP SOLN
2.0000 [drp] | Freq: Once | OPHTHALMIC | Status: AC
  Administered 2023-05-17: 2 [drp] via OPHTHALMIC
  Filled 2023-05-17: qty 4

## 2023-05-17 MED ORDER — GABAPENTIN 100 MG PO CAPS: 100.0000 mg | ORAL_CAPSULE | Freq: Three times a day (TID) | ORAL | 0 refills | Status: DC

## 2023-05-17 MED ORDER — VALACYCLOVIR HCL 1 G PO TABS: 1000.0000 mg | ORAL_TABLET | Freq: Three times a day (TID) | ORAL | 0 refills | Status: AC

## 2023-05-17 MED ORDER — FLUORESCEIN SODIUM 1 MG OP STRP
1.0000 | ORAL_STRIP | Freq: Once | OPHTHALMIC | Status: AC
  Administered 2023-05-17: 1 via OPHTHALMIC
  Filled 2023-05-17: qty 1

## 2023-05-17 MED ORDER — PREDNISONE 20 MG PO TABS: ORAL_TABLET | ORAL | 0 refills | Status: DC

## 2023-05-17 MED ORDER — PREDNISONE 50 MG PO TABS
60.0000 mg | ORAL_TABLET | Freq: Once | ORAL | Status: AC
  Administered 2023-05-17: 60 mg via ORAL
  Filled 2023-05-17: qty 1

## 2023-05-17 MED ORDER — HYDROMORPHONE HCL 1 MG/ML IJ SOLN
1.0000 mg | Freq: Once | INTRAMUSCULAR | Status: AC
  Administered 2023-05-17: 1 mg via INTRAMUSCULAR
  Filled 2023-05-17: qty 1

## 2023-05-17 MED ORDER — GABAPENTIN 100 MG PO CAPS
100.0000 mg | ORAL_CAPSULE | Freq: Once | ORAL | Status: AC
  Administered 2023-05-17: 100 mg via ORAL
  Filled 2023-05-17: qty 1

## 2023-05-17 NOTE — ED Provider Notes (Signed)
[place] EMERGENCY DEPARTMENT AT [facility] Provider Note   CSN: [id number] Arrival date & time: 05/17/23  5284     History  Chief Complaint  Patient presents with   Headache    [name] is a 72 y.o. male.  Third visit to the ER in the last 5 days for headache and eye pain.  Initially thought to be blepharitis and treated appropriately.  Symptoms did improve and only got worse so he returned yesterday had a CT angio which was negative and was given some narcotic pain medicine and a headache cocktail which improved it briefly but then still was not able to sleep last night and seemed that the pain got worse and came in here for further evaluation.  No other acute changes.  No neurologic changes.  No vision changes.  He states he did see an ophthalmologist a few days ago and they did not know for sure what was going on.   Headache      Home Medications Prior to Admission medications   Medication Sig Start Date End Date Taking? Authorizing Provider  gabapentin (NEURONTIN) 100 MG capsule Take 1 capsule (100 mg total) by mouth 3 (three) times daily. 05/17/23  Yes Anes Rigel, Barbara Cower, MD  predniSONE (DELTASONE) 20 MG tablet 3 tabs po daily x 3 days, then 2 tabs x 3 days, then 1.5 tabs x 3 days, then 1 tab x 3 days, then 0.5 tabs x 3 days 05/18/23  Yes Brisa Auth, Barbara Cower, MD  valACYclovir (VALTREX) 1000 MG tablet Take 1 tablet (1,000 mg total) by mouth 3 (three) times daily for 10 days. 05/17/23 05/27/23 Yes Rodderick Holtzer, Barbara Cower, MD  acetaminophen (TYLENOL) 500 MG tablet Take 500 mg by mouth at bedtime.    [provider]  amLODipine (NORVASC) 10 MG tablet TAKE 1 TABLET BY MOUTH EVERY DAY 03/25/23   Antoine Poche, MD  aspirin EC 81 MG tablet Take 1 tablet (81 mg total) by mouth daily. 01/20/21   Malissa Hippo, MD  atorvastatin (LIPITOR) 40 MG tablet TAKE 1 TABLET BY MOUTH EVERY DAY 02/26/22   Antoine Poche, MD  benzonatate (TESSALON) 100 MG capsule Take 1 capsule  (100 mg total) by mouth every 8 (eight) hours. 08/30/22   Particia Nearing, PA-C  cetirizine (ZYRTEC) 10 MG tablet Take 10 mg by mouth daily. Or zyzal    [provider]  erythromycin ophthalmic ointment Place a 1/2 inch ribbon of ointment into the upper eyelid. 05/13/23   Maxwell Marion, PA-C  finasteride (PROSCAR) 5 MG tablet Take 1 tablet (5 mg total) by mouth daily. 08/09/22   Bjorn Pippin, MD  fluticasone (FLONASE) 50 MCG/ACT nasal spray Place 1 spray into both nostrils 2 (two) times daily. 08/30/22   Particia Nearing, PA-C  levocetirizine (XYZAL) 5 MG tablet Take 1 tablet (5 mg total) by mouth every evening. Patient taking differently: Take 5 mg by mouth every evening. Or zyrtec 08/20/21   Wallis Bamberg, PA-C  metFORMIN (GLUCOPHAGE) 1000 MG tablet Take 1,000 mg by mouth 2 (two) times daily.    [provider]  metoprolol tartrate (LOPRESSOR) 25 MG tablet Take 1 tablet (25 mg total) by mouth 2 (two) times daily. 10/09/22   Antoine Poche, MD  nicotine (NICODERM CQ - DOSED IN MG/24 HOURS) 14 mg/24hr patch Place 1 patch (14 mg total) onto the skin daily. Patient not taking: Reported on 10/16/2022 03/19/21   Vassie Loll, MD  [facility] ULTRA test strip USE TO  TEST SUGAR THREE TIMES DAILY 12/21/20   [provider]  oxyCODONE-acetaminophen (PERCOCET/ROXICET) 5-325 MG tablet Take 1 tablet by mouth every 6 (six) hours as needed for severe pain (pain score 7-10). 05/16/23   Bethann Berkshire, MD  ramipril (ALTACE) 10 MG capsule TAKE 2 CAPSULES BY MOUTH EVERY DAY WITH BREAKFAST 02/15/23   Antoine Poche, MD  tadalafil (CIALIS) 5 MG tablet Take 5 mg by mouth daily as needed for erectile dysfunction.    [provider]  tamsulosin (FLOMAX) 0.4 MG CAPS capsule Take 1 capsule (0.4 mg total) by mouth daily. 08/09/22   Bjorn Pippin, MD      Allergies    Meperidine hcl and Promethazine hcl    Review of Systems   Review of Systems  Neurological:  Positive for  headaches.    Physical Exam Updated Vital Signs BP 135/80 (BP Location: Right Arm)   Pulse 77   Temp 98.2 F (36.8 C) (Oral)   Resp (!) 22   Ht 5\' 11"  (1.803 m)   Wt 72.1 kg   SpO2 99%   BMI 22.18 kg/m  Physical Exam Vitals and nursing note reviewed.  Constitutional:      Appearance: He is well-developed.  HENT:     Head: Normocephalic and atraumatic.  Eyes:     Extraocular Movements: Extraocular movements intact.     Pupils: Pupils are equal, round, and reactive to light.     Comments: Fluorescein stain of his right eye shows uptake both laterally and medially but worse laterally.  No obvious dendritic lesions but there are some fine lines that are difficult to see very well with a [place] lamp.  Mild injection of right conjunctivo-.  Cardiovascular:     Rate and Rhythm: Normal rate.  Pulmonary:     Effort: Pulmonary effort is normal. No respiratory distress.  Abdominal:     General: There is no distension.  Musculoskeletal:        General: Normal range of motion.     Cervical back: Normal range of motion.  Skin:    Comments: He has some erythema coming from his scalp over his right temple area to his right thigh.  Tender to touch.  Multiple vesicular lesions within it.  Neurological:     Mental Status: He is alert.     ED Results / Procedures / Treatments   Labs (all labs ordered are listed, but only abnormal results are displayed) Labs Reviewed - No data to display  EKG None  Radiology CT ANGIO HEAD NECK W WO CM  Result Date: 05/16/2023 CLINICAL DATA:  Headache for 3 days, nausea and vomiting EXAM: CT ANGIOGRAPHY HEAD AND NECK WITH AND WITHOUT CONTRAST TECHNIQUE: Multidetector CT imaging of the head and neck was performed using the standard protocol during bolus administration of intravenous contrast. Multiplanar CT image reconstructions and MIPs were obtained to evaluate the vascular anatomy. Carotid stenosis measurements (when applicable) are obtained  utilizing NASCET criteria, using the distal internal carotid diameter as the denominator. RADIATION DOSE REDUCTION: This exam was performed according to the departmental dose-optimization program which includes automated exposure control, adjustment of the mA and/or kV according to patient size and/or use of iterative reconstruction technique. CONTRAST:  OMNIPAQUE IOHEXOL 350 MG/ML SOLN COMPARISON:  No prior CTA available correlation is made with CT head 03/15/2021 and CT neck 11/22/2021 FINDINGS: CT HEAD FINDINGS Brain: No evidence of acute infarct, hemorrhage, mass, mass effect, or midline shift. No hydrocephalus or extra-axial fluid collection. Vascular:  No hyperdense vessel. Skull: Negative for fracture or focal lesion. Sinuses/Orbits: No acute finding. Other: The mastoid air cells are well aerated. CTA NECK FINDINGS Aortic arch: Two-vessel arch with a common origin of the brachiocephalic and left common carotid arteries. Imaged portion shows no evidence of aneurysm or dissection. No significant stenosis of the major arch vessel origins. Aortic atherosclerosis. Right carotid system: No evidence of dissection, occlusion, or hemodynamically significant stenosis (greater than 50%). Atherosclerotic disease at the bifurcation and in the proximal ICA is not hemodynamically significant. Left carotid system: No evidence of dissection, occlusion, or hemodynamically significant stenosis (greater than 50%). Atherosclerotic disease at the bifurcation and in the proximal ICA is not hemodynamically significant. Vertebral arteries: Moderate stenosis at the origin of the right vertebral artery, which is dominant. The vertebral arteries are otherwise patent to the skull base, without additional stenosis or evidence of dissection. Skeleton: No acute osseous abnormality. Degenerative changes in the cervical spine. A status post median sternotomy. Other neck: No acute finding. Upper chest: No focal pulmonary opacity or  pleural effusion. Review of the MIP images confirms the above findings CTA HEAD FINDINGS Anterior circulation: Both internal carotid arteries are patent to the termini, with calcifications but without significant stenosis. Patent right A1. Hypoplastic or aplastic left A1. Normal anterior communicating artery. Anterior cerebral arteries are patent to their distal aspects without significant stenosis. No M1 stenosis or occlusion. MCA branches perfused to their distal aspects without significant stenosis. Posterior circulation: Vertebral arteries patent to the vertebrobasilar junction without significant stenosis. Posterior inferior cerebellar arteries patent proximally. Basilar patent to its distal aspect without significant stenosis. Superior cerebellar arteries patent proximally. Patent P1 segments. PCAs perfused to their distal aspects without significant stenosis. The right posterior communicating artery is patent. Venous sinuses: As permitted by contrast timing, patent. Anatomic variants: None significant. No evidence of aneurysm or vascular malformation. Review of the MIP images confirms the above findings IMPRESSION: 1. No acute intracranial process. 2. No intracranial large vessel occlusion or significant stenosis. 3. Moderate stenosis at the origin of the right vertebral artery, which is dominant. No other hemodynamically significant stenosis in the neck. 4. Aortic atherosclerosis. Aortic Atherosclerosis (ICD10-I70.0). Electronically Signed   By: Wiliam Ke M.D.   On: 05/16/2023 11:25    Procedures Procedures    Medications Ordered in ED Medications  fluorescein ophthalmic strip 1 strip (1 strip Right Eye Given 05/17/23 0444)  tetracaine (PONTOCAINE) 0.5 % ophthalmic solution 2 drop (2 drops Right Eye Given 05/17/23 0444)  HYDROmorphone (DILAUDID) injection 1 mg (1 mg Intramuscular Given 05/17/23 0444)  predniSONE (DELTASONE) tablet 60 mg (60 mg Oral Given 05/17/23 0527)  valACYclovir (VALTREX)  tablet 1,000 mg (1,000 mg Oral Given 05/17/23 0527)  gabapentin (NEURONTIN) capsule 100 mg (100 mg Oral Given 05/17/23 0981)    ED Course/ Medical Decision Making/ A&P                                 Medical Decision Making Risk Prescription drug management.   Patient with signs and symptoms of shingles with likely herpes ophthalmicus.  Started on antivirals, steroids and Neurontin for pain control.  Discussed with Dr. Dione Booze with ophthalmology who would like to see the patient in office this morning.  Phone number and address given to patient.  He will show up before 8:00 to try to be worked in this morning.  No other suggestions at this time from the eye doctor.  Patient received some pain control here which seemed to help.  Discussed with him the evolution of shingles.  He did get a shingles vaccine but is not sure how many and when the last one was.  I have low suspicion for temporal arteritis or glaucoma with his physical exam findings and presentation.   Final Clinical Impression(s) / ED Diagnoses Final diagnoses:  Herpes zoster conjunctivitis  Herpes zoster ophthalmicus    Rx / DC Orders ED Discharge Orders          Ordered    predniSONE (DELTASONE) 20 MG tablet        05/17/23 0527    gabapentin (NEURONTIN) 100 MG capsule  3 times daily        05/17/23 0527    valACYclovir (VALTREX) 1000 MG tablet  3 times daily        05/17/23 0527              Vincie Linn, Barbara Cower, MD 05/17/23 [phone]

## 2023-05-17 NOTE — ED Triage Notes (Signed)
Pt c/o headache, nausea, and right eye irritation, pt seen for same x 2 ED visits this week and ophthalmologist, redness and draining noted to right eye, pt reports rash to right side of face, denies fever

## 2023-05-20 DIAGNOSIS — H04123 Dry eye syndrome of bilateral lacrimal glands: Secondary | ICD-10-CM | POA: Diagnosis not present

## 2023-05-20 DIAGNOSIS — H40051 Ocular hypertension, right eye: Secondary | ICD-10-CM | POA: Diagnosis not present

## 2023-06-02 ENCOUNTER — Ambulatory Visit
Admission: EM | Admit: 2023-06-02 | Discharge: 2023-06-02 | Disposition: A | Discharge status: Home / Self Care | Payer: Medicare HMO

## 2023-06-02 DIAGNOSIS — B0230 Zoster ocular disease, unspecified: Secondary | ICD-10-CM | POA: Diagnosis not present

## 2023-06-02 MED ORDER — GABAPENTIN 100 MG PO CAPS: 100.0000 mg | ORAL_CAPSULE | Freq: Three times a day (TID) | ORAL | 0 refills | Status: DC

## 2023-06-02 MED ORDER — PREDNISONE 20 MG PO TABS: ORAL_TABLET | ORAL | 0 refills | Status: DC

## 2023-06-02 MED ORDER — TRAMADOL HCL 50 MG PO TABS: 50.0000 mg | ORAL_TABLET | Freq: Three times a day (TID) | ORAL | 0 refills | Status: DC | PRN

## 2023-06-02 NOTE — ED Triage Notes (Signed)
Pt reports he is having a shingles outbreak on the right side of his ead and eye. States he is having a bad headache and eye watering. This situation has been going on for about a month.

## 2023-06-02 NOTE — ED Provider Notes (Signed)
[facility]    CSN: [id number] Arrival date & time: 06/02/23  0102      History   Chief Complaint Chief Complaint  Patient presents with   Herpes Zoster    HPI [name] is a 72 y.o. male.   Presenting today with 3 to 4-week history of shingles rash to the right forehead and eye region.  Was initially seen in the emergency department and treated with gabapentin, prednisone, Valtrex.  Has been following with an ophthalmologist since onset additionally.  Denies visual change, fever, chills.  Still having scabs, redness, swelling and significant pain to the area and is out of the medications prescribed.  Ophthalmology appointment is on Thursday.    Past Medical History:  Diagnosis Date   AAA (abdominal aortic aneurysm) (HCC)    3.3 cm in 2012   ABDOMINAL AORTIC ANEURYSM 11/07/2009   Diameter of 3.3 cm by CT in 12/2010    Anxiety    ASCVD (arteriosclerotic cardiovascular disease)    LAD stent in 1999; RCA bare-metal stent in 12/2007, LAD stent patent, 50-60% proximal Cx lesion treated medically; 09/2012: CABG x3, LIMA to LAD, Sequential SVG to ramus intermediate and OM branch with EVH via right thigh   Coronary artery disease    Depression    Diabetes mellitus    DIABETES MELLITUS, TYPE II 08/09/2010   no insulin     ED (erectile dysfunction)    Gastric ulcer    Hepatic steatosis    Hyperlipidemia    HYPERLIPIDEMIA 08/09/2010   Lipid profile in 07/2010:144, 124, 34, 86; 01/2012:93, 82, 34, 43    Hypertension    Insomnia    MI (myocardial infarction) (HCC)    Nephrolithiasis    NEPHROLITHIASIS, RECURRENT 08/09/2010   Obstruction of the ureter on CT scan of the abdomen resulting in right hydronephrosis in 12/2010; bladder outlet obstruction prior to that.  CT also revealed atherosclerotic changes in the abdominal arterial vessels, hepatic granuloma and calcification of the prostate.    S/P CABG x 3 10/07/2012   LIMA to LAD, Sequential SVG to ramus intermediate  and OM branch with EVH via right thigh   THROMBOCYTOPENIA 08/09/2010   Platelets 88-114,000 over the past 4 years; prior evaluation by Dr. Mariel Sleet without a specific etiology identified.  01/2012: Normal CBC ex platelets-120    Thrombocytopenia (HCC)    Chronic; evaluated by Dr. Mariel Sleet in 2007   Tobacco abuse     Patient Active Problem List   Diagnosis Date Noted   Left upper quadrant abdominal pain 06/15/2021   Gross hematuria 06/15/2021   BPH with urinary obstruction 06/15/2021   Weakness    CAP (community acquired pneumonia) 03/15/2021   Ureteral stone 07/09/2013   Hypertension 10/02/2012   Type 2 diabetes mellitus with hyperlipidemia (HCC) 08/09/2010   HYPERLIPIDEMIA 08/09/2010   THROMBOCYTOPENIA 08/09/2010   Arteriosclerotic cardiovascular disease (ASCVD) 08/09/2010   Renal stones 08/09/2010   ERECTILE DYSFUNCTION, ORGANIC 08/09/2010   INSOMNIA 08/09/2010   Abdominal aortic aneurysm (HCC) 11/07/2009    Past Surgical History:  Procedure Laterality Date   COLONOSCOPY N/A 01/19/2021   Procedure: COLONOSCOPY;  Surgeon: Malissa Hippo, MD;  Location: [facility];  Service: Endoscopy;  Laterality: N/A;  12:30   CORONARY ANGIOPLASTY WITH STENT PLACEMENT     1999, 2009   CORONARY ARTERY BYPASS GRAFT N/A 10/07/2012   Procedure: CORONARY ARTERY BYPASS GRAFTING (CABG);  Surgeon: Purcell Nails, MD;  Location: [facility] OR;  Service: Open Heart Surgery;  Laterality: N/A;   CYSTOSCOPY WITH LITHOLAPAXY N/A 10/29/2019   Procedure: CYSTOSCOPY WITH LITHOLAPAXY;  Surgeon: Bjorn Pippin, MD;  Location: [facility];  Service: Urology;  Laterality: N/A;   CYSTOSCOPY WITH RETROGRADE PYELOGRAM, URETEROSCOPY AND STENT PLACEMENT Left 08/30/2015   Procedure: CYSTOSCOPY LEFT RETROGRADE PYELOGRAM  LEFT   URETEROSCOPY,  LITHOLAPAXY;  Surgeon: Bjorn Pippin, MD;  Location: [facility];  Service: Urology;  Laterality: Left;   ESOPHAGOGASTRODUODENOSCOPY  04/04/2012   Procedure: ESOPHAGOGASTRODUODENOSCOPY (EGD);  Surgeon:  Malissa Hippo, MD;  Location: [facility];  Service: Endoscopy;  Laterality: N/A;  830   ESOPHAGOGASTRODUODENOSCOPY  07/25/2012   Procedure: ESOPHAGOGASTRODUODENOSCOPY (EGD);  Surgeon: Malissa Hippo, MD;  Location: [facility];  Service: Endoscopy;  Laterality: N/A;  830   HOLMIUM LASER APPLICATION Left 08/30/2015   Procedure: HOLMIUM LASER APPLICATION;  Surgeon: Bjorn Pippin, MD;  Location: [facility];  Service: Urology;  Laterality: Left;   INGUINAL HERNIA REPAIR     INTRAOPERATIVE TRANSESOPHAGEAL ECHOCARDIOGRAM N/A 10/07/2012   Procedure: INTRAOPERATIVE TRANSESOPHAGEAL ECHOCARDIOGRAM;  Surgeon: Purcell Nails, MD;  Location: [facility] OR;  Service: Open Heart Surgery;  Laterality: N/A;   LEFT HEART CATHETERIZATION WITH CORONARY ANGIOGRAM N/A 10/03/2012   Procedure: LEFT HEART CATHETERIZATION WITH CORONARY ANGIOGRAM;  Surgeon: Rollene Rotunda, MD;  Location: [facility] CATH LAB;  Service: Cardiovascular;  Laterality: N/A;   LITHOTRIPSY     POLYPECTOMY  01/19/2021   Procedure: POLYPECTOMY;  Surgeon: Malissa Hippo, MD;  Location: [facility];  Service: Endoscopy;;       Home Medications    Prior to Admission medications   Medication Sig Start Date End Date Taking? Authorizing Provider  traMADol (ULTRAM) 50 MG tablet Take 1 tablet (50 mg total) by mouth 3 (three) times daily as needed. 06/02/23  Yes Particia Nearing, PA-C  valACYclovir (VALTREX) 1000 MG tablet Take 1,000 mg by mouth 2 (two) times daily.   Yes [provider]  acetaminophen (TYLENOL) 500 MG tablet Take 500 mg by mouth at bedtime.    [provider]  amLODipine (NORVASC) 10 MG tablet TAKE 1 TABLET BY MOUTH EVERY DAY 03/25/23   Antoine Poche, MD  aspirin EC 81 MG tablet Take 1 tablet (81 mg total) by mouth daily. 01/20/21   Malissa Hippo, MD  atorvastatin (LIPITOR) 40 MG tablet TAKE 1 TABLET BY MOUTH EVERY DAY 02/26/22   Antoine Poche, MD  benzonatate (TESSALON) 100 MG capsule Take 1 capsule (100 mg total)  by mouth every 8 (eight) hours. 08/30/22   Particia Nearing, PA-C  cetirizine (ZYRTEC) 10 MG tablet Take 10 mg by mouth daily. Or zyzal    [provider]  erythromycin ophthalmic ointment Place a 1/2 inch ribbon of ointment into the upper eyelid. 05/13/23   Maxwell Marion, PA-C  finasteride (PROSCAR) 5 MG tablet Take 1 tablet (5 mg total) by mouth daily. 08/09/22   Bjorn Pippin, MD  fluticasone (FLONASE) 50 MCG/ACT nasal spray Place 1 spray into both nostrils 2 (two) times daily. 08/30/22   Particia Nearing, PA-C  gabapentin (NEURONTIN) 100 MG capsule Take 1 capsule (100 mg total) by mouth 3 (three) times daily. 06/02/23   Particia Nearing, PA-C  levocetirizine (XYZAL) 5 MG tablet Take 1 tablet (5 mg total) by mouth every evening. Patient taking differently: Take 5 mg by mouth every evening. Or zyrtec 08/20/21   Wallis Bamberg, PA-C  metFORMIN (GLUCOPHAGE) 1000 MG tablet Take 1,000 mg by mouth 2 (two) times daily.  [provider]  metoprolol tartrate (LOPRESSOR) 25 MG tablet Take 1 tablet (25 mg total) by mouth 2 (two) times daily. 10/09/22   Antoine Poche, MD  nicotine (NICODERM CQ - DOSED IN MG/24 HOURS) 14 mg/24hr patch Place 1 patch (14 mg total) onto the skin daily. Patient not taking: Reported on 10/16/2022 03/19/21   Vassie Loll, MD  [facility] ULTRA test strip USE TO TEST SUGAR THREE TIMES DAILY 12/21/20   [provider]  oxyCODONE-acetaminophen (PERCOCET/ROXICET) 5-325 MG tablet Take 1 tablet by mouth every 6 (six) hours as needed for severe pain (pain score 7-10). 05/16/23   Bethann Berkshire, MD  predniSONE (DELTASONE) 20 MG tablet 3 tabs po daily x 3 days, then 2 tabs x 3 days, then 1.5 tabs x 3 days, then 1 tab x 3 days, then 0.5 tabs x 3 days 06/02/23   Particia Nearing, PA-C  ramipril (ALTACE) 10 MG capsule TAKE 2 CAPSULES BY MOUTH EVERY DAY WITH BREAKFAST 02/15/23   Antoine Poche, MD  tadalafil (CIALIS) 5 MG tablet Take 5 mg by mouth daily  as needed for erectile dysfunction.    [provider]  tamsulosin (FLOMAX) 0.4 MG CAPS capsule Take 1 capsule (0.4 mg total) by mouth daily. 08/09/22   Bjorn Pippin, MD    Family History Family History  Problem Relation Age of Onset   Diabetes Mother    Ovarian cancer Sister     Social History Social History   Tobacco Use   Smoking status: Every Day    Current packs/day: 0.00    Average packs/day: 0.5 packs/day for 40.0 years (20.0 ttl pk-yrs)    Types: Cigarettes    Start date: 07/09/1973    Last attempt to quit: 07/09/2013    Years since quitting: 9.9   Smokeless tobacco: Never   Tobacco comments:    6-7 cigs. daily   Vaping Use   Vaping status: Never Used  Substance Use Topics   Alcohol use: No    Alcohol/week: 0.0 standard drinks of alcohol   Drug use: No     Allergies   Meperidine hcl and Promethazine hcl   Review of Systems Review of Systems Per HPI  Physical Exam Triage Vital Signs ED Triage Vitals  Encounter Vitals Group     BP 06/02/23 0823 113/71     Systolic BP Percentile --      Diastolic BP Percentile --      Pulse Rate 06/02/23 0823 77     Resp 06/02/23 0823 16     Temp 06/02/23 0823 98.2 F (36.8 C)     Temp Source 06/02/23 0823 Oral     SpO2 06/02/23 0823 95 %     Weight --      Height --      Head Circumference --      Peak Flow --      Pain Score 06/02/23 0824 6     Pain Loc --      Pain Education --      Exclude from Growth Chart --    No data found.  Updated Vital Signs BP 113/71 (BP Location: Right Arm)   Pulse 77   Temp 98.2 F (36.8 C) (Oral)   Resp 16   SpO2 95%   Visual Acuity Right Eye Distance:   Left Eye Distance:   Bilateral Distance:    Right Eye Near:   Left Eye Near:    Bilateral Near:     Physical  Exam Vitals and nursing note reviewed.  Constitutional:      Appearance: Normal appearance.  HENT:     Head: Atraumatic.  Eyes:     Extraocular Movements: Extraocular movements intact.      Comments: Clear drainage, conjunctival erythema to the right eye Fluorescein stain deferred today as actively following with ophthalmology  Cardiovascular:     Rate and Rhythm: Normal rate and regular rhythm.  Pulmonary:     Effort: Pulmonary effort is normal.     Breath sounds: Normal breath sounds.  Musculoskeletal:        General: Normal range of motion.     Cervical back: Normal range of motion and neck supple.  Skin:    General: Skin is warm and dry.     Findings: Rash present.     Comments: Erythema, edema and several scabbed ulcerations to the right forehead and scalp.  Eyelid erythematous on the right upper  Neurological:     General: No focal deficit present.     Mental Status: He is oriented to person, place, and time.     Motor: No weakness.     Gait: Gait normal.  Psychiatric:        Mood and Affect: Mood normal.        Thought Content: Thought content normal.        Judgment: Judgment normal.      UC Treatments / Results  Labs (all labs ordered are listed, but only abnormal results are displayed) Labs Reviewed - No data to display  EKG   Radiology No results found.  Procedures Procedures (including critical care time)  Medications Ordered in UC Medications - No data to display  Initial Impression / Assessment and Plan / UC Course  I have reviewed the triage vital signs and the nursing notes.  Pertinent labs & imaging results that were available during my care of the patient were reviewed by me and considered in my medical decision making (see chart for details).     Will refill prednisone, gabapentin and tramadol as he is still in significant pain.  Continue following with ophthalmology closely, PCP follow-up recommended additionally. Final Clinical Impressions(s) / UC Diagnoses   Final diagnoses:  Herpes zoster with ophthalmic complication, unspecified herpes zoster eye disease   Discharge Instructions   None    ED Prescriptions      Medication Sig Dispense Auth. Provider   predniSONE (DELTASONE) 20 MG tablet 3 tabs po daily x 3 days, then 2 tabs x 3 days, then 1.5 tabs x 3 days, then 1 tab x 3 days, then 0.5 tabs x 3 days 27 tablet Particia Nearing, PA-C   gabapentin (NEURONTIN) 100 MG capsule Take 1 capsule (100 mg total) by mouth 3 (three) times daily. 60 capsule Particia Nearing, [place]   traMADol (ULTRAM) 50 MG tablet Take 1 tablet (50 mg total) by mouth 3 (three) times daily as needed. 15 tablet Particia Nearing, [place]      I have reviewed the PDMP during this encounter.   Roosvelt Maser [place], [place] 06/02/23 [phone]

## 2023-06-03 ENCOUNTER — Encounter: Payer: Self-pay | Admitting: Neurology

## 2023-06-06 DIAGNOSIS — B0233 Zoster keratitis: Secondary | ICD-10-CM | POA: Diagnosis not present

## 2023-06-06 DIAGNOSIS — H40051 Ocular hypertension, right eye: Secondary | ICD-10-CM | POA: Diagnosis not present

## 2023-06-17 DIAGNOSIS — B0233 Zoster keratitis: Secondary | ICD-10-CM | POA: Diagnosis not present

## 2023-06-19 ENCOUNTER — Other Ambulatory Visit: Payer: Self-pay | Admitting: Family Medicine

## 2023-06-19 ENCOUNTER — Other Ambulatory Visit: Payer: Self-pay | Admitting: Urology

## 2023-06-19 ENCOUNTER — Ambulatory Visit
Admission: EM | Admit: 2023-06-19 | Discharge: 2023-06-19 | Disposition: A | Discharge status: Home / Self Care | Payer: Medicare HMO | Attending: Family Medicine | Admitting: Family Medicine

## 2023-06-19 DIAGNOSIS — G4701 Insomnia due to medical condition: Secondary | ICD-10-CM

## 2023-06-19 DIAGNOSIS — N401 Enlarged prostate with lower urinary tract symptoms: Secondary | ICD-10-CM

## 2023-06-19 DIAGNOSIS — B0229 Other postherpetic nervous system involvement: Secondary | ICD-10-CM

## 2023-06-19 MED ORDER — HYDROXYZINE HCL 10 MG PO TABS: 10.0000 mg | ORAL_TABLET | Freq: Every evening | ORAL | 0 refills | Status: AC | PRN

## 2023-06-19 NOTE — ED Triage Notes (Addendum)
Pt reports right facial pain, pt states he had shingles and was given medicine completed course, but is still having pain in his face. Pain goes from under the eye to the back of the head.

## 2023-06-19 NOTE — Discharge Instructions (Signed)
Continue the gabapentin for the nerve pain from shingles.  I have sent in some hydroxyzine to see if this helps you with your sleep at night.  It will cause drowsiness so make sure not to drive or take any other sedating over-the-counter medications with this.  Follow-up with your primary care provider for a recheck.

## 2023-06-20 NOTE — ED Provider Notes (Signed)
[facility]    CSN: [id number] Arrival date & time: 06/19/23  0801      History   Chief Complaint No chief complaint on file.   HPI [name] is a 72 y.o. male.   Patient presenting today following up on ongoing neuropathic pain from recent shingles outbreak to the right side of face and eye region.  Was treated with 2 rounds of Valtrex and prednisone and has been on gabapentin, dose recently increased to 300 mg 3 times daily.  He is awaiting neurology appointment for further management of his neuralgia wanting to know what he can do in the meantime for the severe pain.  States rash has now cleared.  He states he is having difficulty sleeping due to the pain and is wanting to know what he can take for this.    Past Medical History:  Diagnosis Date   AAA (abdominal aortic aneurysm) (HCC)    3.3 cm in 2012   ABDOMINAL AORTIC ANEURYSM 11/07/2009   Diameter of 3.3 cm by CT in 12/2010    Anxiety    ASCVD (arteriosclerotic cardiovascular disease)    LAD stent in 1999; RCA bare-metal stent in 12/2007, LAD stent patent, 50-60% proximal Cx lesion treated medically; 09/2012: CABG x3, LIMA to LAD, Sequential SVG to ramus intermediate and OM branch with EVH via right thigh   Coronary artery disease    Depression    Diabetes mellitus    DIABETES MELLITUS, TYPE II 08/09/2010   no insulin     ED (erectile dysfunction)    Gastric ulcer    Hepatic steatosis    Hyperlipidemia    HYPERLIPIDEMIA 08/09/2010   Lipid profile in 07/2010:144, 124, 34, 86; 01/2012:93, 82, 34, 43    Hypertension    Insomnia    MI (myocardial infarction) (HCC)    Nephrolithiasis    NEPHROLITHIASIS, RECURRENT 08/09/2010   Obstruction of the ureter on CT scan of the abdomen resulting in right hydronephrosis in 12/2010; bladder outlet obstruction prior to that.  CT also revealed atherosclerotic changes in the abdominal arterial vessels, hepatic granuloma and calcification of the prostate.    S/P CABG x 3  10/07/2012   LIMA to LAD, Sequential SVG to ramus intermediate and OM branch with EVH via right thigh   THROMBOCYTOPENIA 08/09/2010   Platelets 88-114,000 over the past 4 years; prior evaluation by Dr. Mariel Sleet without a specific etiology identified.  01/2012: Normal CBC ex platelets-120    Thrombocytopenia (HCC)    Chronic; evaluated by Dr. Mariel Sleet in 2007   Tobacco abuse     Patient Active Problem List   Diagnosis Date Noted   Left upper quadrant abdominal pain 06/15/2021   Gross hematuria 06/15/2021   BPH with urinary obstruction 06/15/2021   Weakness    CAP (community acquired pneumonia) 03/15/2021   Ureteral stone 07/09/2013   Hypertension 10/02/2012   Type 2 diabetes mellitus with hyperlipidemia (HCC) 08/09/2010   HYPERLIPIDEMIA 08/09/2010   THROMBOCYTOPENIA 08/09/2010   Arteriosclerotic cardiovascular disease (ASCVD) 08/09/2010   Renal stones 08/09/2010   ERECTILE DYSFUNCTION, ORGANIC 08/09/2010   INSOMNIA 08/09/2010   Abdominal aortic aneurysm (HCC) 11/07/2009    Past Surgical History:  Procedure Laterality Date   COLONOSCOPY N/A 01/19/2021   Procedure: COLONOSCOPY;  Surgeon: Malissa Hippo, MD;  Location: [facility];  Service: Endoscopy;  Laterality: N/A;  12:30   CORONARY ANGIOPLASTY WITH STENT PLACEMENT     1999, 2009   CORONARY ARTERY BYPASS GRAFT N/A 10/07/2012  Procedure: CORONARY ARTERY BYPASS GRAFTING (CABG);  Surgeon: Purcell Nails, MD;  Location: [facility];  Service: Open Heart Surgery;  Laterality: N/A;   CYSTOSCOPY WITH LITHOLAPAXY N/A 10/29/2019   Procedure: CYSTOSCOPY WITH LITHOLAPAXY;  Surgeon: Bjorn Pippin, MD;  Location: [facility];  Service: Urology;  Laterality: N/A;   CYSTOSCOPY WITH RETROGRADE PYELOGRAM, URETEROSCOPY AND STENT PLACEMENT Left 08/30/2015   Procedure: CYSTOSCOPY LEFT RETROGRADE PYELOGRAM  LEFT   URETEROSCOPY,  LITHOLAPAXY;  Surgeon: Bjorn Pippin, MD;  Location: [facility];  Service: Urology;  Laterality: Left;   ESOPHAGOGASTRODUODENOSCOPY   04/04/2012   Procedure: ESOPHAGOGASTRODUODENOSCOPY (EGD);  Surgeon: Malissa Hippo, MD;  Location: [facility];  Service: Endoscopy;  Laterality: N/A;  830   ESOPHAGOGASTRODUODENOSCOPY  07/25/2012   Procedure: ESOPHAGOGASTRODUODENOSCOPY (EGD);  Surgeon: Malissa Hippo, MD;  Location: [facility];  Service: Endoscopy;  Laterality: N/A;  830   HOLMIUM LASER APPLICATION Left 08/30/2015   Procedure: HOLMIUM LASER APPLICATION;  Surgeon: Bjorn Pippin, MD;  Location: [facility];  Service: Urology;  Laterality: Left;   INGUINAL HERNIA REPAIR     INTRAOPERATIVE TRANSESOPHAGEAL ECHOCARDIOGRAM N/A 10/07/2012   Procedure: INTRAOPERATIVE TRANSESOPHAGEAL ECHOCARDIOGRAM;  Surgeon: Purcell Nails, MD;  Location: [facility] OR;  Service: Open Heart Surgery;  Laterality: N/A;   LEFT HEART CATHETERIZATION WITH CORONARY ANGIOGRAM N/A 10/03/2012   Procedure: LEFT HEART CATHETERIZATION WITH CORONARY ANGIOGRAM;  Surgeon: Rollene Rotunda, MD;  Location: [facility] CATH LAB;  Service: Cardiovascular;  Laterality: N/A;   LITHOTRIPSY     POLYPECTOMY  01/19/2021   Procedure: POLYPECTOMY;  Surgeon: Malissa Hippo, MD;  Location: [facility];  Service: Endoscopy;;       Home Medications    Prior to Admission medications   Medication Sig Start Date End Date Taking? Authorizing Provider  hydrOXYzine (ATARAX) 10 MG tablet Take 1 tablet (10 mg total) by mouth at bedtime as needed. 06/19/23  Yes Particia Nearing, PA-C  acetaminophen (TYLENOL) 500 MG tablet Take 500 mg by mouth at bedtime.    [provider]  amLODipine (NORVASC) 10 MG tablet TAKE 1 TABLET BY MOUTH EVERY DAY 03/25/23   Antoine Poche, MD  aspirin EC 81 MG tablet Take 1 tablet (81 mg total) by mouth daily. 01/20/21   Malissa Hippo, MD  atorvastatin (LIPITOR) 40 MG tablet TAKE 1 TABLET BY MOUTH EVERY DAY 02/26/22   Antoine Poche, MD  benzonatate (TESSALON) 100 MG capsule Take 1 capsule (100 mg total) by mouth every 8 (eight) hours. 08/30/22   Particia Nearing, PA-C  cetirizine (ZYRTEC) 10 MG tablet Take 10 mg by mouth daily. Or zyzal    [provider]  erythromycin ophthalmic ointment Place a 1/2 inch ribbon of ointment into the upper eyelid. 05/13/23   Maxwell Marion, PA-C  finasteride (PROSCAR) 5 MG tablet TAKE 1 TABLET (5 MG TOTAL) BY MOUTH DAILY. 06/20/23   Bjorn Pippin, MD  fluticasone (FLONASE) 50 MCG/ACT nasal spray Place 1 spray into both nostrils 2 (two) times daily. 08/30/22   Particia Nearing, PA-C  gabapentin (NEURONTIN) 100 MG capsule Take 1 capsule (100 mg total) by mouth 3 (three) times daily. 06/02/23   Particia Nearing, PA-C  levocetirizine (XYZAL) 5 MG tablet Take 1 tablet (5 mg total) by mouth every evening. Patient taking differently: Take 5 mg by mouth every evening. Or zyrtec 08/20/21   Wallis Bamberg, PA-C  metFORMIN (GLUCOPHAGE) 1000 MG tablet Take 1,000 mg by mouth 2 (two) times daily.  [provider]  metoprolol tartrate (LOPRESSOR) 25 MG tablet Take 1 tablet (25 mg total) by mouth 2 (two) times daily. 10/09/22   Antoine Poche, MD  nicotine (NICODERM CQ - DOSED IN MG/24 HOURS) 14 mg/24hr patch Place 1 patch (14 mg total) onto the skin daily. Patient not taking: Reported on 10/16/2022 03/19/21   Vassie Loll, MD  [facility] ULTRA test strip USE TO TEST SUGAR THREE TIMES DAILY 12/21/20   [provider]  oxyCODONE-acetaminophen (PERCOCET/ROXICET) 5-325 MG tablet Take 1 tablet by mouth every 6 (six) hours as needed for severe pain (pain score 7-10). 05/16/23   Bethann Berkshire, MD  predniSONE (DELTASONE) 20 MG tablet 3 tabs po daily x 3 days, then 2 tabs x 3 days, then 1.5 tabs x 3 days, then 1 tab x 3 days, then 0.5 tabs x 3 days 06/02/23   Particia Nearing, PA-C  ramipril (ALTACE) 10 MG capsule TAKE 2 CAPSULES BY MOUTH EVERY DAY WITH BREAKFAST 02/15/23   Antoine Poche, MD  tadalafil (CIALIS) 5 MG tablet Take 5 mg by mouth daily as needed for erectile dysfunction.    [provider]  tamsulosin (FLOMAX) 0.4 MG CAPS capsule TAKE 1 CAPSULE BY MOUTH EVERY DAY 06/20/23   Bjorn Pippin, MD  traMADol (ULTRAM) 50 MG tablet Take 1 tablet (50 mg total) by mouth 3 (three) times daily as needed. 06/02/23   Particia Nearing, PA-C  valACYclovir (VALTREX) 1000 MG tablet Take 1,000 mg by mouth 2 (two) times daily.    [provider]    Family History Family History  Problem Relation Age of Onset   Diabetes Mother    Ovarian cancer Sister     Social History Social History   Tobacco Use   Smoking status: Every Day    Current packs/day: 0.00    Average packs/day: 0.5 packs/day for 40.0 years (20.0 ttl pk-yrs)    Types: Cigarettes    Start date: 07/09/1973    Last attempt to quit: 07/09/2013    Years since quitting: 9.9   Smokeless tobacco: Never   Tobacco comments:    6-7 cigs. daily   Vaping Use   Vaping status: Never Used  Substance Use Topics   Alcohol use: No    Alcohol/week: 0.0 standard drinks of alcohol   Drug use: No     Allergies   Meperidine hcl and Promethazine hcl   Review of Systems Review of Systems Per HPI  Physical Exam Triage Vital Signs ED Triage Vitals  Encounter Vitals Group     BP 06/19/23 0815 127/77     Systolic BP Percentile --      Diastolic BP Percentile --      Pulse Rate 06/19/23 0815 93     Resp 06/19/23 0815 18     Temp 06/19/23 0815 (!) 97 F (36.1 C)     Temp Source 06/19/23 0815 Oral     SpO2 06/19/23 0815 98 %     Weight --      Height --      Head Circumference --      Peak Flow --      Pain Score 06/19/23 0818 9     Pain Loc --      Pain Education --      Exclude from Growth Chart --    No data found.  Updated Vital Signs BP 127/77 (BP Location: Right Arm)   Pulse 93   Temp (!) 97 F (  36.1 C) (Oral)   Resp 18   SpO2 98%   Visual Acuity Right Eye Distance:   Left Eye Distance:   Bilateral Distance:    Right Eye Near:   Left Eye Near:    Bilateral Near:     Physical  Exam Vitals and nursing note reviewed.  Constitutional:      Appearance: Normal appearance.  HENT:     Head: Atraumatic.     Mouth/Throat:     Mouth: Mucous membranes are moist.  Eyes:     Extraocular Movements: Extraocular movements intact.     Conjunctiva/sclera: Conjunctivae normal.     Pupils: Pupils are equal, round, and reactive to light.  Cardiovascular:     Rate and Rhythm: Normal rate and regular rhythm.  Pulmonary:     Effort: Pulmonary effort is normal.     Breath sounds: Normal breath sounds.  Musculoskeletal:        General: Normal range of motion.     Cervical back: Normal range of motion and neck supple.  Skin:    General: Skin is warm and dry.     Findings: No rash.  Neurological:     General: No focal deficit present.     Mental Status: He is oriented to person, place, and time.  Psychiatric:        Mood and Affect: Mood normal.        Thought Content: Thought content normal.        Judgment: Judgment normal.      UC Treatments / Results  Labs (all labs ordered are listed, but only abnormal results are displayed) Labs Reviewed - No data to display  EKG   Radiology No results found.  Procedures Procedures (including critical care time)  Medications Ordered in UC Medications - No data to display  Initial Impression / Assessment and Plan / UC Course  I have reviewed the triage vital signs and the nursing notes.  Pertinent labs & imaging results that were available during my care of the patient were reviewed by me and considered in my medical decision making (see chart for details).     Continue gabapentin for postherpetic neuralgia and follow-up with neurology as scheduled.  Regarding the sleep, discussed he can try low-dose hydroxyzine to see if this helps him sleep better through the discomfort.  Follow-up with PCP and neurologist for recheck.  Final Clinical Impressions(s) / UC Diagnoses   Final diagnoses:  Post herpetic neuralgia   Insomnia due to medical condition     Discharge Instructions      Continue the gabapentin for the nerve pain from shingles.  I have sent in some hydroxyzine to see if this helps you with your sleep at night.  It will cause drowsiness so make sure not to drive or take any other sedating over-the-counter medications with this.  Follow-up with your primary care provider for a recheck.    ED Prescriptions     Medication Sig Dispense Auth. Provider   hydrOXYzine (ATARAX) 10 MG tablet Take 1 tablet (10 mg total) by mouth at bedtime as needed. 10 tablet Particia Nearing, [place]      PDMP not reviewed this encounter.   Particia Nearing, [place] 06/20/23 1609

## 2023-07-08 DIAGNOSIS — B0233 Zoster keratitis: Secondary | ICD-10-CM | POA: Diagnosis not present

## 2023-07-12 NOTE — Progress Notes (Signed)
Initial neurology clinic note  [name] MRN: [id number] DOB: 04/03/1951  Referring provider: Bethann Berkshire, MD  Primary care provider: Assunta Found, MD  Reason for consult:  headache  Subjective:  This is [name], a 73 y.o. right-handed male with a medical history of DM, CAD, HTN, AAA, HLD, depression, anxiety who presents to neurology clinic with headaches. The patient is accompanied by wife.  Patient's symptoms started around 05/12/23. He had pain in his right eye. He presented to ED on 05/13/23 for eye pain with swelling of right upper eyelid and redness of eye. He was diagnosed with blepharitis and was given tetracaine and erythromycin eye drops.   He then went to ED on 05/16/23 for headache. He had CT head and CTA head and neck neither of which showed any significant findings. He was given percocet and discharged with neurology outpatient follow up. Headache returned and patient came back to the ED on 05/17/23. Vesicular lesions where seen in right temple area. He was then referred to an eye doctor. The eye doctor saw patient and diagnosed him with shingles. 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 He was started on valtrex 1000 mg TID, prednisone 20 mg, and gabapentin 100 mg TID.  He went to urgent care on 06/02/23 as he ran out of medication. This was refilled for patient. He returned to ED on 06/19/23 due to ongoing neuropathic pain. His rash had cleared. His gabapentin had been increased to 300 mg TID. ED also added atarax 10 mg at bedtime to help him sleep.  He continues to have pain around the eye and tip of nose that he describes as tingling and numb. It can go to the back side of his head as well. He is currently taking tylenol every 6 hours while awake and gabapentin 300 mg BID. It does help ease the pain, but does not get rid of the pain. He has photophobia, but only in the right eye. He denies phonophobia, nausea, or vomiting. He denies any vision  changes. It can be as bad as 8-9/10. With gabapentin or tylenol the symptoms can reduce to 1-2/10. Chewing or swallowing are good and do not make symptoms worse.   He denies fevers. He has lost about 15 lbs since symptom onset but has regained about 5 lbs.  He denies any symptoms in his arms or legs.  Patient has never had the shingles vaccine.  Smoker: 2-3 cigarettes daily EtOH use: None Restrictive diet: No Family history of neurologic disease: Mother had headaches  MEDICATIONS:  Outpatient Encounter Medications as of 07/24/2023  Medication Sig   acetaminophen (TYLENOL) 500 MG tablet Take 500 mg by mouth at bedtime.   amLODipine (NORVASC) 10 MG tablet TAKE 1 TABLET BY MOUTH EVERY DAY   aspirin EC 81 MG tablet Take 1 tablet (81 mg total) by mouth daily.   atorvastatin (LIPITOR) 40 MG tablet TAKE 1 TABLET BY MOUTH EVERY DAY   cetirizine (ZYRTEC) 10 MG tablet Take 10 mg by mouth daily. Or zyzal   finasteride (PROSCAR) 5 MG tablet TAKE 1 TABLET (5 MG TOTAL) BY MOUTH DAILY.   fluticasone (FLONASE) 50 MCG/ACT nasal spray Place 1 spray into both nostrils 2 (two) times daily.   metFORMIN (GLUCOPHAGE) 1000 MG tablet Take 1,000 mg by mouth 2 (two) times daily.   metoprolol tartrate (LOPRESSOR) 25 MG tablet Take 1 tablet (25 mg total) by mouth 2 (two) times daily.   nicotine (NICODERM CQ - DOSED IN MG/24  HOURS) 14 mg/24hr patch Place 1 patch (14 mg total) onto the skin daily.   ONETOUCH ULTRA test strip USE TO TEST SUGAR THREE TIMES DAILY   ramipril (ALTACE) 10 MG capsule TAKE 2 CAPSULES BY MOUTH EVERY DAY WITH BREAKFAST   tadalafil (CIALIS) 5 MG tablet Take 5 mg by mouth daily as needed for erectile dysfunction.   tamsulosin (FLOMAX) 0.4 MG CAPS capsule TAKE 1 CAPSULE BY MOUTH EVERY DAY   [DISCONTINUED] gabapentin (NEURONTIN) 100 MG capsule Take 1 capsule (100 mg total) by mouth 3 (three) times daily. (Patient taking differently: Take 100 mg by mouth 3 (three) times daily. Patient take it 2 time  daily)   benzonatate (TESSALON) 100 MG capsule Take 1 capsule (100 mg total) by mouth every 8 (eight) hours. (Patient not taking: Reported on 07/24/2023)   erythromycin ophthalmic ointment Place a 1/2 inch ribbon of ointment into the upper eyelid. (Patient not taking: Reported on 07/24/2023)   gabapentin (NEURONTIN) 300 MG capsule Take 1 capsule (300 mg) in the morning, take 1 capsule (300 mg) midday, and take 2 capsules (600 mg) in the evening   hydrOXYzine (ATARAX) 10 MG tablet Take 1 tablet (10 mg total) by mouth at bedtime as needed. (Patient not taking: Reported on 07/24/2023)   levocetirizine (XYZAL) 5 MG tablet Take 1 tablet (5 mg total) by mouth every evening. (Patient not taking: Reported on 07/24/2023)   oxyCODONE-acetaminophen (PERCOCET/ROXICET) 5-325 MG tablet Take 1 tablet by mouth every 6 (six) hours as needed for severe pain (pain score 7-10). (Patient not taking: Reported on 07/24/2023)   predniSONE (DELTASONE) 20 MG tablet 3 tabs po daily x 3 days, then 2 tabs x 3 days, then 1.5 tabs x 3 days, then 1 tab x 3 days, then 0.5 tabs x 3 days (Patient not taking: Reported on 07/24/2023)   traMADol (ULTRAM) 50 MG tablet Take 1 tablet (50 mg total) by mouth 3 (three) times daily as needed. (Patient not taking: Reported on 07/24/2023)   valACYclovir (VALTREX) 1000 MG tablet Take 1,000 mg by mouth 2 (two) times daily. (Patient not taking: Reported on 07/24/2023)   No facility-administered encounter medications on file as of 07/24/2023.    PAST MEDICAL HISTORY: Past Medical History:  Diagnosis Date   AAA (abdominal aortic aneurysm) (HCC)    3.3 cm in 2012   ABDOMINAL AORTIC ANEURYSM 11/07/2009   Diameter of 3.3 cm by CT in 12/2010    Anxiety    ASCVD (arteriosclerotic cardiovascular disease)    LAD stent in 1999; RCA bare-metal stent in 12/2007, LAD stent patent, 50-60% proximal Cx lesion treated medically; 09/2012: CABG x3, LIMA to LAD, Sequential SVG to ramus intermediate and OM branch with EVH  via right thigh   Coronary artery disease    Depression    Diabetes mellitus    DIABETES MELLITUS, TYPE II 08/09/2010   no insulin     ED (erectile dysfunction)    Gastric ulcer    Hepatic steatosis    Hyperlipidemia    HYPERLIPIDEMIA 08/09/2010   Lipid profile in 07/2010:144, 124, 34, 86; 01/2012:93, 82, 34, 43    Hypertension    Insomnia    MI (myocardial infarction) (HCC)    Nephrolithiasis    NEPHROLITHIASIS, RECURRENT 08/09/2010   Obstruction of the ureter on CT scan of the abdomen resulting in right hydronephrosis in 12/2010; bladder outlet obstruction prior to that.  CT also revealed atherosclerotic changes in the abdominal arterial vessels, hepatic granuloma and calcification of the prostate.  S/P CABG x 3 10/07/2012   LIMA to LAD, Sequential SVG to ramus intermediate and OM branch with EVH via right thigh   THROMBOCYTOPENIA 08/09/2010   Platelets 88-114,000 over the past 4 years; prior evaluation by Dr. Mariel Sleet without a specific etiology identified.  01/2012: Normal CBC ex platelets-120    Thrombocytopenia (HCC)    Chronic; evaluated by Dr. Mariel Sleet in 2007   Tobacco abuse     PAST SURGICAL HISTORY: Past Surgical History:  Procedure Laterality Date   COLONOSCOPY N/A 01/19/2021   Procedure: COLONOSCOPY;  Surgeon: Malissa Hippo, MD;  Location: [facility];  Service: Endoscopy;  Laterality: N/A;  12:30   CORONARY ANGIOPLASTY WITH STENT PLACEMENT     1999, 2009   CORONARY ARTERY BYPASS GRAFT N/A 10/07/2012   Procedure: CORONARY ARTERY BYPASS GRAFTING (CABG);  Surgeon: Purcell Nails, MD;  Location: [facility] OR;  Service: Open Heart Surgery;  Laterality: N/A;   CYSTOSCOPY WITH LITHOLAPAXY N/A 10/29/2019   Procedure: CYSTOSCOPY WITH LITHOLAPAXY;  Surgeon: Bjorn Pippin, MD;  Location: [facility] ORS;  Service: Urology;  Laterality: N/A;   CYSTOSCOPY WITH RETROGRADE PYELOGRAM, URETEROSCOPY AND STENT PLACEMENT Left 08/30/2015   Procedure: CYSTOSCOPY LEFT RETROGRADE PYELOGRAM  LEFT    URETEROSCOPY,  LITHOLAPAXY;  Surgeon: Bjorn Pippin, MD;  Location: [facility];  Service: Urology;  Laterality: Left;   ESOPHAGOGASTRODUODENOSCOPY  04/04/2012   Procedure: ESOPHAGOGASTRODUODENOSCOPY (EGD);  Surgeon: Malissa Hippo, MD;  Location: [facility];  Service: Endoscopy;  Laterality: N/A;  830   ESOPHAGOGASTRODUODENOSCOPY  07/25/2012   Procedure: ESOPHAGOGASTRODUODENOSCOPY (EGD);  Surgeon: Malissa Hippo, MD;  [facility]: [facility];  Service: Endoscopy;  Laterality: N/A;  830   HOLMIUM LASER APPLICATION Left 08/30/2015   Procedure: HOLMIUM LASER APPLICATION;  Surgeon: Bjorn Pippin, MD;  Location: [facility];  Service: Urology;  Laterality: Left;   INGUINAL HERNIA REPAIR     INTRAOPERATIVE TRANSESOPHAGEAL ECHOCARDIOGRAM N/A 10/07/2012   Procedure: INTRAOPERATIVE TRANSESOPHAGEAL ECHOCARDIOGRAM;  Surgeon: Purcell Nails, MD;  Location[facility];  Service: Open Heart Surgery;  Laterality: N/A;   LEFT HEART CATHETERIZATION WITH CORONARY ANGIOGRAM N/A 10/03/2012   Procedure: LEFT HEART CATHETERIZATION WITH CORONARY ANGIOGRAM;  Surgeon: Rollene Rotunda, MD;  Location: [facility] CATH LAB;  Service: Cardiovascular;  Laterality: N/A;   LITHOTRIPSY     POLYPECTOMY  01/19/2021   Procedure: POLYPECTOMY;  Surgeon: Malissa Hippo, MD;  Location: [facility];  Service: Endoscopy;;    ALLERGIES: Allergies  Allergen Reactions   Meperidine Hcl Other (See Comments)    Was mixed with phenergan and turned blood vessels red.    Promethazine Hcl Other (See Comments)    Was mixed with the Demerol and caused veins to turn red.     FAMILY HISTORY: Family History  Problem Relation Age of Onset   Diabetes Mother    Ovarian cancer Sister     SOCIAL HISTORY: Social History   Tobacco Use   Smoking status: Every Day    Current packs/day: 0.00    Average packs/day: 0.5 packs/day for 40.0 years (20.0 ttl pk-yrs)    Types: Cigarettes    Start date: 07/09/1973    Last attempt to quit: 07/09/2013    Years since quitting: 10.0    Smokeless tobacco: Never   Tobacco comments:    2-3cigs. daily   Vaping Use   Vaping status: Never Used  Substance Use Topics   Alcohol use: No    Alcohol/week: 0.0 standard drinks of alcohol   Drug use:  No   Social History   Social History Narrative   No regular exercise   Are you right handed or left handed? Right   Are you currently employed ?    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   Who lives with you? family   What type of home do you live in: 1 story or 2 story? one    Caffiene  2 cups a day    Objective:  Vital Signs:  BP 120/73   Pulse 83   Ht 5\' 11"  (1.803 m)   Wt 158 lb (71.7 kg)   SpO2 97%   BMI 22.04 kg/m   General: No acute distress.  Patient appears well-groomed.   Head:  Normocephalic/atraumatic Eyes:  fundi examined but not visualized Neck: supple Lungs: Non-labored breathing on room air   Neurological Exam: Mental status: alert and oriented, speech fluent and not dysarthric, language intact.  Cranial nerves: CN I: not tested CN II: pupils equal, round and reactive to light, visual fields intact CN III, IV, VI:  full range of motion, no nystagmus, mild right sided ptosis CN V: facial sensation intact. CN VII: upper and lower face symmetric CN VIII: hearing intact CN IX, X: uvula midline CN XI: sternocleidomastoid and trapezius muscles intact CN XII: tongue midline  Bulk & Tone: normal, no fasciculations. Motor:  muscle strength 5/5 throughout Deep Tendon Reflexes:  2+ throughout.   Sensation:  Pinprick sensation intact. Finger to nose testing:  Without dysmetria.   Gait:  Normal station and stride.   Labs and Imaging review: Internal labs: Lab Results  Component Value Date   HGBA1C 5.8 (H) 03/15/2021   Lab Results  Component Value Date   TSH 0.62 04/13/2008   05/16/23: CMP significant for glucose of 135 CBC w/ diff significant for plts 125 (chronic)  Imaging: CT head and CTA  head and neck (05/16/2023): FINDINGS: CT HEAD FINDINGS   Brain: No evidence of acute infarct, hemorrhage, mass, mass effect, or midline shift. No hydrocephalus or extra-axial fluid collection.   Vascular: No hyperdense vessel.   Skull: Negative for fracture or focal lesion.   Sinuses/Orbits: No acute finding.   Other: The mastoid air cells are well aerated.   CTA NECK FINDINGS   Aortic arch: Two-vessel arch with a common origin of the brachiocephalic and left common carotid arteries. Imaged portion shows no evidence of aneurysm or dissection. No significant stenosis of the major arch vessel origins. Aortic atherosclerosis.   Right carotid system: No evidence of dissection, occlusion, or hemodynamically significant stenosis (greater than 50%). Atherosclerotic disease at the bifurcation and in the proximal ICA is not hemodynamically significant.   Left carotid system: No evidence of dissection, occlusion, or hemodynamically significant stenosis (greater than 50%). Atherosclerotic disease at the bifurcation and in the proximal ICA is not hemodynamically significant.   Vertebral arteries: Moderate stenosis at the origin of the right vertebral artery, which is dominant. The vertebral arteries are otherwise patent to the skull base, without additional stenosis or evidence of dissection.   Skeleton: No acute osseous abnormality. Degenerative changes in the cervical spine. A status post median sternotomy.   Other neck: No acute finding.   Upper chest: No focal pulmonary opacity or pleural effusion.   Review of the MIP images confirms the above findings   CTA HEAD FINDINGS   Anterior circulation: Both internal carotid arteries are patent to the termini, with calcifications but without significant stenosis.  Patent right A1. Hypoplastic or aplastic left A1. Normal anterior communicating artery. Anterior cerebral arteries are patent to their distal aspects without  significant stenosis.   No M1 stenosis or occlusion. MCA branches perfused to their distal aspects without significant stenosis.   Posterior circulation: Vertebral arteries patent to the vertebrobasilar junction without significant stenosis. Posterior inferior cerebellar arteries patent proximally.   Basilar patent to its distal aspect without significant stenosis. Superior cerebellar arteries patent proximally.   Patent P1 segments. PCAs perfused to their distal aspects without significant stenosis. The right posterior communicating artery is patent.   Venous sinuses: As permitted by contrast timing, patent.   Anatomic variants: None significant.   No evidence of aneurysm or vascular malformation.   Review of the MIP images confirms the above findings   IMPRESSION: 1. No acute intracranial process. 2. No intracranial large vessel occlusion or significant stenosis. 3. Moderate stenosis at the origin of the right vertebral artery, which is dominant. No other hemodynamically significant stenosis in the neck. 4. Aortic atherosclerosis.  Assessment/Plan:  AMJAD ZUPAN is a 73 y.o. male who presents for evaluation of right face pain. He has a relevant medical history of DM, CAD, HTN, AAA, HLD, depression, anxiety. His neurological examination is essentially normal. Available diagnostic data is significant for [facility] and CTA head and neck with no acute process or pathology to explain symptoms. Patient's symptoms are most consistent with post herpetic neuralgia. I explained the natural history of this with the patient, emphasizing that symptoms can linger for months but is expected to continue to improved with time, but could recur.  PLAN: -Increase gabapentin to 300 mg in the morning, 300 mg in the afternoon, and 600 mg in the evening -Next step would be to add nortriptyline -No contraindication to future shingles vaccination  -Return to clinic in ~3 months  The impression above  as well as the plan as outlined below were extensively discussed with the patient (in the company of wife) who voiced understanding. All questions were answered to their satisfaction.  When available, results of the above investigations and possible further recommendations will be communicated to the patient via telephone/MyChart. Patient to call office if not contacted after expected testing turnaround time.   Total time spent reviewing records, interview, history/exam, documentation, and coordination of care on day of encounter:  55 min   Thank you for allowing me to participate in patient's care.  If I can answer any additional questions, I would be pleased to do so.  Jacquelyne Balint, MD   CC: Assunta Found, MD [address] [place] [address]  CC: Referring provider: Bethann Berkshire, MD [address] [place],  [place] [address]

## 2023-07-18 ENCOUNTER — Ambulatory Visit: Payer: Medicare HMO | Admitting: Cardiology

## 2023-07-24 ENCOUNTER — Ambulatory Visit: Payer: Medicare HMO | Admitting: Neurology

## 2023-07-24 ENCOUNTER — Encounter: Payer: Self-pay | Admitting: Neurology

## 2023-07-24 VITALS — BP 120/73 | HR 83 | Ht 71.0 in | Wt 158.0 lb

## 2023-07-24 DIAGNOSIS — B0229 Other postherpetic nervous system involvement: Secondary | ICD-10-CM

## 2023-07-24 DIAGNOSIS — R519 Headache, unspecified: Secondary | ICD-10-CM | POA: Diagnosis not present

## 2023-07-24 MED ORDER — GABAPENTIN 300 MG PO CAPS: ORAL_CAPSULE | ORAL | 5 refills | Status: DC

## 2023-07-24 NOTE — Patient Instructions (Addendum)
I saw you today for right head/face pain. This is likely the result of shingles, called post herpetic neuralgia.  I will increase your gabapentin to 300 mg in the morning, 300 mg in the afternoon, and 600 mg in the evening. I sent a new prescription to your pharmacy, but you can continue to take your current medication that you have as instructed above.  If this is not giving enough relief, please let me know by MyChart or phone and we can adjust medications.  I expect this will take some time but will get better.  I will see you back in clinic in about 3 months. Please let me know if you have any questions or concerns in the meantime.   The physicians and staff at [facility] are committed to providing excellent care. You may receive a survey requesting feedback about your experience at our office. We strive to receive "very good" responses to the survey questions. If you feel that your experience would prevent you from giving the office a "very good " response, please contact our office to try to remedy the situation. We may be reached at [phone]. Thank you for taking the time out of your busy day to complete the survey.  Jacquelyne Balint, MD [facility]

## 2023-08-01 ENCOUNTER — Other Ambulatory Visit: Payer: Medicare HMO

## 2023-08-01 DIAGNOSIS — N138 Other obstructive and reflux uropathy: Secondary | ICD-10-CM | POA: Diagnosis not present

## 2023-08-01 DIAGNOSIS — N401 Enlarged prostate with lower urinary tract symptoms: Secondary | ICD-10-CM

## 2023-08-02 LAB — PSA, TOTAL AND FREE
PSA, Free Pct: 30 %
PSA, Free: 0.09 ng/mL
Prostate Specific Ag, Serum: 0.3 ng/mL (ref 0.0–4.0)

## 2023-08-08 ENCOUNTER — Ambulatory Visit: Payer: Medicare HMO | Admitting: Urology

## 2023-08-15 ENCOUNTER — Other Ambulatory Visit: Payer: Self-pay | Admitting: Neurology

## 2023-08-15 ENCOUNTER — Telehealth: Payer: Self-pay | Admitting: Neurology

## 2023-08-15 DIAGNOSIS — B0229 Other postherpetic nervous system involvement: Secondary | ICD-10-CM

## 2023-08-15 DIAGNOSIS — R519 Headache, unspecified: Secondary | ICD-10-CM

## 2023-08-15 MED ORDER — GABAPENTIN 300 MG PO CAPS: ORAL_CAPSULE | ORAL | 5 refills | Status: DC

## 2023-08-15 NOTE — Telephone Encounter (Signed)
Opened in error

## 2023-08-15 NOTE — Telephone Encounter (Signed)
Called pt and talked to [name] and informed her of Dr. Jorge Ny notes and she understood and we compared what he is taking to the new script and it is the same.

## 2023-08-15 NOTE — Telephone Encounter (Signed)
Patient wants to know if he an increase the Gabapentin by one pill a day   He would like to take it as follows  1 @ 7 1 @ 12  1 @ 4 and then  2 @ 9  Right now he is taking it as follows 1 @ 7 1 @ 12 and then  2 @ 9   Please call

## 2023-08-22 ENCOUNTER — Ambulatory Visit: Payer: Medicare HMO | Admitting: Urology

## 2023-09-25 ENCOUNTER — Other Ambulatory Visit: Payer: Self-pay | Admitting: Family Medicine

## 2023-09-30 ENCOUNTER — Telehealth: Payer: Self-pay | Admitting: Neurology

## 2023-09-30 NOTE — Telephone Encounter (Signed)
Called pharmacy and left message to call office.

## 2023-09-30 NOTE — Telephone Encounter (Signed)
 Pt. Calling to rqst refill of Rx gabapentin, [facility] [address] [place]

## 2023-09-30 NOTE — Telephone Encounter (Signed)
 Called pt and left message that medicine was refilled in Feb.

## 2023-10-01 NOTE — Telephone Encounter (Signed)
 Called [facility] again and was unable to speak with pharmacy staff. Left message.

## 2023-10-01 NOTE — Telephone Encounter (Signed)
 Left message on machine for patient to call back.

## 2023-10-10 ENCOUNTER — Ambulatory Visit: Payer: Medicare HMO | Attending: Cardiology | Admitting: Cardiology

## 2023-10-10 ENCOUNTER — Encounter: Payer: Self-pay | Admitting: Cardiology

## 2023-10-10 VITALS — BP 134/66 | HR 68 | Ht 71.0 in | Wt 168.0 lb

## 2023-10-10 DIAGNOSIS — E782 Mixed hyperlipidemia: Secondary | ICD-10-CM

## 2023-10-10 DIAGNOSIS — I714 Abdominal aortic aneurysm, without rupture, unspecified: Secondary | ICD-10-CM | POA: Diagnosis not present

## 2023-10-10 DIAGNOSIS — I10 Essential (primary) hypertension: Secondary | ICD-10-CM

## 2023-10-10 DIAGNOSIS — I2510 Atherosclerotic heart disease of native coronary artery without angina pectoris: Secondary | ICD-10-CM

## 2023-10-10 NOTE — Patient Instructions (Signed)
 Medication Instructions:  Your physician recommends that you continue on your current medications as directed. Please refer to the Current Medication list given to you today.  *If you need a refill on your cardiac medications before your next appointment, please call your pharmacy*  Lab Work: None If you have labs (blood work) drawn today and your tests are completely normal, you will receive your results only by: MyChart Message (if you have MyChart) OR A paper copy in the mail If you have any lab test that is abnormal or we need to change your treatment, we will call you to review the results.  Testing/Procedures: abdominal aortic aneurysm ultrasound    Follow-Up: At [facility], you and your health needs are our priority.  As part of our continuing mission to provide you with exceptional heart care, our providers are all part of one team.  This team includes your primary Cardiologist (physician) and Advanced Practice Providers or APPs (Physician Assistants and Nurse Practitioners) who all work together to provide you with the care you need, when you need it.  Your next appointment:   6 month(s)  Provider:   You may see Dina Rich, MD or one of the following Advanced Practice Providers on your designated Care Team:   Randall An, PA-C  [place], [place] Jacolyn Reedy, [place]     We recommend signing up for the patient portal called "MyChart".  Sign up information is provided on this After Visit Summary.  MyChart is used to connect with patients for Virtual Visits (Telemedicine).  Patients are able to view lab/test results, encounter notes, upcoming appointments, etc.  Non-urgent messages can be sent to your provider as well.   To learn more about what you can do with MyChart, go to [email or web address].   Other Instructions

## 2023-10-10 NOTE — Progress Notes (Signed)
 Clinical Summary Mr. Mcfadyen is a 73 y.o.male seen today for follow up of the following medical problems     1. CAD   - prior 3 vessel CABG in 09/2012 (LIMA to LAD, SVG to ramus, SVG to OM), history of PCI prior to that. 09/2012 LVEF 60-65% by echo     -no chest pains, no SOB/DOE - compliant with meds     2. HTN    - he is compliant with meds   3. HLD - 01/2023 TC 96 TG 97 HDL 37 LDL 40   4. AAA   - [place] 09/2013 3.5 x 4.5, slightly larger than prior study a year prior (3.3x3.9) - for repeat study in 2019   - no recent symptoms.    06/2019 4.3 cm aneurusm 06/2021 CT renal stone: 4.4 x 4.4 cm previously 4.3 x 3.9 cm.   10/2022 AAA [place]: 4.4 x 5 cm, overall stable   5. PVCs -infrequent palpitations.   6. 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   Past Medical History:  Diagnosis Date   AAA (abdominal aortic aneurysm) (HCC)    3.3 cm in 2012   ABDOMINAL AORTIC ANEURYSM 11/07/2009   Diameter of 3.3 cm by CT in 12/2010    Anxiety    ASCVD (arteriosclerotic cardiovascular disease)    LAD stent in 1999; RCA bare-metal stent in 12/2007, LAD stent patent, 50-60% proximal Cx lesion treated medically; 09/2012: CABG x3, LIMA to LAD, Sequential SVG to ramus intermediate and OM [facility] with EVH via right thigh   Coronary artery disease    Depression    Diabetes mellitus    DIABETES MELLITUS, TYPE II 08/09/2010   no insulin     ED (erectile dysfunction)    Gastric ulcer    Hepatic steatosis    Hyperlipidemia    HYPERLIPIDEMIA 08/09/2010   Lipid profile in 07/2010:144, 124, 34, 86; 01/2012:93, 82, 34, 43    Hypertension    Insomnia    MI (myocardial infarction) (HCC)    Nephrolithiasis    NEPHROLITHIASIS, RECURRENT 08/09/2010   Obstruction of the ureter on CT scan of the abdomen resulting in right hydronephrosis in 12/2010; bladder outlet obstruction prior to that.  CT also  revealed atherosclerotic changes in the abdominal arterial vessels, hepatic granuloma and calcification of the prostate.    S/P CABG x 3 10/07/2012   LIMA to LAD, Sequential SVG to ramus intermediate and OM Krisha Beegle with EVH via right thigh   THROMBOCYTOPENIA 08/09/2010   Platelets 88-114,000 over the past 4 years; prior evaluation by Dr. Mariel Sleet without a specific etiology identified.  01/2012: Normal CBC ex platelets-120    Thrombocytopenia (HCC)    Chronic; evaluated by Dr. Mariel Sleet in 2007   Tobacco abuse      Allergies  Allergen Reactions   Meperidine Hcl Other (See Comments)    Was mixed with phenergan and turned blood vessels red.    Promethazine Hcl Other (See Comments)    Was mixed with the Demerol and caused veins to turn red.      Current Outpatient Medications  Medication Sig Dispense Refill   acetaminophen (TYLENOL) 500 MG tablet Take 500 mg by mouth at bedtime.     amLODipine (NORVASC) 10 MG tablet TAKE 1 TABLET BY MOUTH EVERY DAY 90 tablet 3   aspirin EC 81 MG tablet Take 1 tablet (81 mg  total) by mouth daily. 30 tablet 11   atorvastatin (LIPITOR) 40 MG tablet TAKE 1 TABLET BY MOUTH EVERY DAY 90 tablet 1   benzonatate (TESSALON) 100 MG capsule Take 1 capsule (100 mg total) by mouth every 8 (eight) hours. (Patient not taking: Reported on 07/24/2023) 21 capsule 0   cetirizine (ZYRTEC) 10 MG tablet Take 10 mg by mouth daily. Or zyzal     erythromycin ophthalmic ointment Place a 1/2 inch ribbon of ointment into the upper eyelid. (Patient not taking: Reported on 07/24/2023) 3.5 g 0   finasteride (PROSCAR) 5 MG tablet TAKE 1 TABLET (5 MG TOTAL) BY MOUTH DAILY. 100 tablet 3   fluticasone (FLONASE) 50 MCG/ACT nasal spray Place 1 spray into both nostrils 2 (two) times daily. 16 g 2   gabapentin (NEURONTIN) 300 MG capsule Take 1 capsule (300 mg) in the morning, take 1 capsule (300 mg) midday, take 1 capsule (300 mg) in the afternoon, and take 2 capsules (600 mg) in the evening 150  capsule 5   hydrOXYzine (ATARAX) 10 MG tablet Take 1 tablet (10 mg total) by mouth at bedtime as needed. (Patient not taking: Reported on 07/24/2023) 10 tablet 0   levocetirizine (XYZAL) 5 MG tablet Take 1 tablet (5 mg total) by mouth every evening. (Patient not taking: Reported on 07/24/2023) 90 tablet 0   metFORMIN (GLUCOPHAGE) 1000 MG tablet Take 1,000 mg by mouth 2 (two) times daily.     metoprolol tartrate (LOPRESSOR) 25 MG tablet Take 1 tablet (25 mg total) by mouth 2 (two) times daily. 180 tablet 0   nicotine (NICODERM CQ - DOSED IN MG/24 HOURS) 14 mg/24hr patch Place 1 patch (14 mg total) onto the skin daily. 28 patch 0   ONETOUCH ULTRA test strip USE TO TEST SUGAR THREE TIMES DAILY     oxyCODONE-acetaminophen (PERCOCET/ROXICET) 5-325 MG tablet Take 1 tablet by mouth every 6 (six) hours as needed for severe pain (pain score 7-10). (Patient not taking: Reported on 07/24/2023) 15 tablet 0   predniSONE (DELTASONE) 20 MG tablet 3 tabs po daily x 3 days, then 2 tabs x 3 days, then 1.5 tabs x 3 days, then 1 tab x 3 days, then 0.5 tabs x 3 days (Patient not taking: Reported on 07/24/2023) 27 tablet 0   ramipril (ALTACE) 10 MG capsule TAKE 2 CAPSULES BY MOUTH EVERY DAY WITH BREAKFAST 180 capsule 3   tadalafil (CIALIS) 5 MG tablet Take 5 mg by mouth daily as needed for erectile dysfunction.     tamsulosin (FLOMAX) 0.4 MG CAPS capsule TAKE 1 CAPSULE BY MOUTH EVERY DAY 100 capsule 3   traMADol (ULTRAM) 50 MG tablet Take 1 tablet (50 mg total) by mouth 3 (three) times daily as needed. (Patient not taking: Reported on 07/24/2023) 15 tablet 0   valACYclovir (VALTREX) 1000 MG tablet Take 1,000 mg by mouth 2 (two) times daily. (Patient not taking: Reported on 07/24/2023)     No current facility-administered medications for this visit.     Past Surgical History:  Procedure Laterality Date   COLONOSCOPY N/A 01/19/2021   Procedure: COLONOSCOPY;  Surgeon: Malissa Hippo, MD;  [facility];  Service:  Endoscopy;  Laterality: N/A;  12:30   CORONARY ANGIOPLASTY WITH STENT PLACEMENT     1999, 2009   CORONARY ARTERY BYPASS GRAFT N/A 10/07/2012   Procedure: CORONARY ARTERY BYPASS GRAFTING (CABG);  Surgeon: Purcell Nails, MD;  Location: [facility] OR;  Service: Open Heart Surgery;  Laterality: N/A;  CYSTOSCOPY WITH LITHOLAPAXY N/A 10/29/2019   Procedure: CYSTOSCOPY WITH LITHOLAPAXY;  Surgeon: Bjorn Pippin, MD;  Location: [facility];  Service: Urology;  Laterality: N/A;   CYSTOSCOPY WITH RETROGRADE PYELOGRAM, URETEROSCOPY AND STENT PLACEMENT Left 08/30/2015   Procedure: CYSTOSCOPY LEFT RETROGRADE PYELOGRAM  LEFT   URETEROSCOPY,  LITHOLAPAXY;  Surgeon: Bjorn Pippin, MD;  Location: [facility];  Service: Urology;  Laterality: Left;   ESOPHAGOGASTRODUODENOSCOPY  04/04/2012   Procedure: ESOPHAGOGASTRODUODENOSCOPY (EGD);  Surgeon: Malissa Hippo, MD;  [facility];  Service: Endoscopy;  Laterality: N/A;  830   ESOPHAGOGASTRODUODENOSCOPY  07/25/2012   Procedure: ESOPHAGOGASTRODUODENOSCOPY (EGD);  Surgeon: Malissa Hippo, MD;  [facility];  Service: Endoscopy;  Laterality: N/A;  830   HOLMIUM LASER APPLICATION Left 08/30/2015   Procedure: HOLMIUM LASER APPLICATION;  Surgeon: Bjorn Pippin, MD;  Location: [facility];  Service: Urology;  Laterality: Left;   INGUINAL HERNIA REPAIR     INTRAOPERATIVE TRANSESOPHAGEAL ECHOCARDIOGRAM N/A 10/07/2012   Procedure: INTRAOPERATIVE TRANSESOPHAGEAL ECHOCARDIOGRAM;  Surgeon: Purcell Nails, MD;  Location: [facility] OR;  Service: Open Heart Surgery;  Laterality: N/A;   LEFT HEART CATHETERIZATION WITH CORONARY ANGIOGRAM N/A 10/03/2012   Procedure: LEFT HEART CATHETERIZATION WITH CORONARY ANGIOGRAM;  Surgeon: Rollene Rotunda, MD;  Location: [facility] CATH LAB;  Service: Cardiovascular;  Laterality: N/A;   LITHOTRIPSY     POLYPECTOMY  01/19/2021   Procedure: POLYPECTOMY;  Surgeon: Malissa Hippo, MD;  [facility];  Service: Endoscopy;;     Allergies  Allergen Reactions    Meperidine Hcl Other (See Comments)    Was mixed with phenergan and turned blood vessels red.    Promethazine Hcl Other (See Comments)    Was mixed with the Demerol and caused veins to turn red.       Family History  Problem Relation Age of Onset   Diabetes Mother    Ovarian cancer Sister      Social History Mr. Hacker reports that he has been smoking cigarettes. He started smoking about 50 years ago. He has a 20 pack-year smoking history. He has never used smokeless tobacco. Mr. Schuld reports no history of alcohol use.     Physical Examination Today's Vitals   10/10/23 0832  BP: 134/66  Pulse: 68  SpO2: 98%  Weight: 168 lb (76.2 kg)  Height: 5\' 11"  (1.803 m)   Body mass index is 23.43 kg/m.  Gen: resting comfortably, no acute distress HEENT: no scleral icterus, pupils equal round and reactive, no palptable cervical adenopathy,  CV: RRR, no m/rg, no jvd Resp: Clear to auscultation bilaterally GI: abdomen is soft, non-tender, non-distended, normal bowel sounds, no hepatosplenomegaly MSK: extremities are warm, no edema.  Skin: warm, no rash Neuro:  no focal deficits Psych: appropriate affect   Diagnostic Studies  09/2012 Echo: LVEF 55-60%, no WMAs,    09/2012 AAA [place]: 3.3 x 3.9 cm, stable from prior CT   08/2013 AAA [place] Infrarenal abdominal aortic aneurysm measuring 3.5 x 4.5 cm.   Previous examination of 10/06/2012 demonstrated the abdominal aorta   measuring 3.3 x 3.9 cm.   08/2014 AAA US IMPRESSION: 4.4 cm infrarenal abdominal aortic aneurysm. Recommend followup by [place] in 3 years. This recommendation follows ACR consensus guidelines: White Paper of the ACR Incidental Findings Committee II on Vascular Findings. J Am Coll Radiol 2013; 46:962-952   Assessment and Plan    1. CAD   -doing well without symptoms, continue current meds   2. HTN   -  bp is at goal, continue current meds   3. Hyperlipidemia -at goal, continue curren tmeds   4. AAA - repeat  AAA [place]  F/u  6months    Antoine Poche, M.D.,

## 2023-10-15 ENCOUNTER — Ambulatory Visit (HOSPITAL_COMMUNITY)
Admission: RE | Admit: 2023-10-15 | Discharge: 2023-10-15 | Disposition: A | Discharge status: Home / Self Care | Source: Ambulatory Visit | Attending: Cardiology | Admitting: Cardiology

## 2023-10-15 DIAGNOSIS — I7143 Infrarenal abdominal aortic aneurysm, without rupture: Secondary | ICD-10-CM | POA: Diagnosis not present

## 2023-10-15 DIAGNOSIS — I714 Abdominal aortic aneurysm, without rupture, unspecified: Secondary | ICD-10-CM | POA: Insufficient documentation

## 2023-10-29 ENCOUNTER — Telehealth: Payer: Self-pay | Admitting: Cardiology

## 2023-10-29 NOTE — Telephone Encounter (Signed)
 Left a message for patient/wife to call office regarding testing results.

## 2023-10-29 NOTE — Telephone Encounter (Signed)
 Pt calling in regards to results. Please advise

## 2023-10-29 NOTE — Telephone Encounter (Signed)
 Patient's wife Genevia Kern) returned staff call regarding results.

## 2023-10-30 NOTE — Telephone Encounter (Signed)
 Patient notified of results via MyChart.

## 2023-11-08 NOTE — Progress Notes (Signed)
 NEUROLOGY FOLLOW UP OFFICE NOTE  [name] [id number]  Subjective:  [name] is a 73 y.o. year old  right-handed male with a medical history of DM, CAD, HTN, AAA, HLD, depression, anxiety who we last saw on 07/24/23 for right face pain.  To briefly review: Patient's symptoms started around 05/12/23. He had pain in his right eye. He presented to ED on 05/13/23 for eye pain with swelling of right upper eyelid and redness of eye. He was diagnosed with blepharitis and was given tetracaine  and erythromycin  eye drops.    He then went to ED on 05/16/23 for headache. He had CT head and CTA head and neck neither of which showed any significant findings. He was given percocet and discharged with neurology outpatient follow up. Headache returned and patient came back to the ED on 05/17/23. Vesicular lesions where seen in right temple area. He was then referred to an eye doctor. The eye doctor saw patient and diagnosed him with shingles. 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 He was started on valtrex  1000 mg TID, prednisone  20 mg, and gabapentin  100 mg TID.   He went to urgent care on 06/02/23 as he ran out of medication. This was refilled for patient. He returned to ED on 06/19/23 due to ongoing neuropathic pain. His rash had cleared. His gabapentin  had been increased to 300 mg TID. ED also added atarax  10 mg at bedtime to help him sleep.   He continues to have pain around the eye and tip of nose that he describes as tingling and numb. It can go to the back side of his head as well. He is currently taking tylenol  every 6 hours while awake and gabapentin  300 mg BID. It does help ease the pain, but does not get rid of the pain. He has photophobia, but only in the right eye. He denies phonophobia, nausea, or vomiting. He denies any vision changes. It can be as bad as 8-9/10. With gabapentin  or tylenol  the symptoms can reduce to 1-2/10. Chewing or swallowing are good and do not make  symptoms worse.    He denies fevers. He has lost about 15 lbs since symptom onset but has regained about 5 lbs.   He denies any symptoms in his arms or legs.   Patient has never had the shingles vaccine.   Smoker: 2-3 cigarettes daily EtOH use: None Restrictive diet: No Family history of neurologic disease: Mother had headaches  Most recent Assessment and Plan (07/24/23): [name] is a 73 y.o. male who presents for evaluation of right face pain. He has a relevant medical history of DM, CAD, HTN, AAA, HLD, depression, anxiety. His neurological examination is essentially normal. Available diagnostic data is significant for [facility] and CTA head and neck with no acute process or pathology to explain symptoms. Patient's symptoms are most consistent with post herpetic neuralgia. I explained the natural history of this with the patient, emphasizing that symptoms can linger for months but is expected to continue to improved with time, but could recur.   PLAN: -Increase gabapentin  to 300 mg in the morning, 300 mg in the afternoon, and 600 mg in the evening -Next step would be to add nortriptyline -No contraindication to future shingles vaccination  Since their last visit: Patient called on 08/15/23 to see if he could take an extra dose of gabapentin  in the afternoon to help with pain. I let him know this was okay and gave new prescription. He is  now taking gabapentin  300/300/300/600 mg. After taking the medication, the pain will decrease some, after about 1 hour. He will get relief for a few of hours though. The pain will be bearable with gabapentin . He has no problems with vision but will feel needles into the eye area. Touching his face or brushing his teeth does not cause or affect the pain.   Since symptom onset, there may be mild improvement.  He has had no further rash.  MEDICATIONS:  Outpatient Encounter Medications as of 11/21/2023  Medication Sig   acetaminophen  (TYLENOL ) 500 MG tablet  Take 500 mg by mouth at bedtime.   amLODipine  (NORVASC ) 10 MG tablet TAKE 1 TABLET BY MOUTH EVERY DAY   aspirin  EC 81 MG tablet Take 1 tablet (81 mg total) by mouth daily.   atorvastatin  (LIPITOR ) 40 MG tablet TAKE 1 TABLET BY MOUTH EVERY DAY   cetirizine (ZYRTEC) 10 MG tablet Take 10 mg by mouth daily. Or zyzal   finasteride  (PROSCAR ) 5 MG tablet TAKE 1 TABLET (5 MG TOTAL) BY MOUTH DAILY.   fluticasone  (FLONASE ) 50 MCG/ACT nasal spray Place 1 spray into both nostrils 2 (two) times daily. (Patient taking differently: Place 1 spray into both nostrils as needed for allergies.)   gabapentin  (NEURONTIN ) 300 MG capsule Take 1 capsule (300 mg) in the morning, take 1 capsule (300 mg) midday, take 1 capsule (300 mg) in the afternoon, and take 2 capsules (600 mg) in the evening   hydrOXYzine  (ATARAX ) 10 MG tablet Take 1 tablet (10 mg total) by mouth at bedtime as needed.   levocetirizine (XYZAL ) 5 MG tablet Take 1 tablet (5 mg total) by mouth every evening.   metFORMIN (GLUCOPHAGE) 1000 MG tablet Take 1,000 mg by mouth 2 (two) times daily.   metoprolol  tartrate (LOPRESSOR ) 25 MG tablet Take 1 tablet (25 mg total) by mouth 2 (two) times daily.   ONETOUCH ULTRA test strip USE TO TEST SUGAR THREE TIMES DAILY   ramipril  (ALTACE ) 10 MG capsule TAKE 2 CAPSULES BY MOUTH EVERY DAY WITH BREAKFAST   tadalafil  (CIALIS ) 5 MG tablet Take 5 mg by mouth daily as needed for erectile dysfunction.   tamsulosin  (FLOMAX ) 0.4 MG CAPS capsule TAKE 1 CAPSULE BY MOUTH EVERY DAY   benzonatate  (TESSALON ) 100 MG capsule Take 1 capsule (100 mg total) by mouth every 8 (eight) hours. (Patient not taking: Reported on 07/24/2023)   erythromycin  ophthalmic ointment Place a 1/2 inch ribbon of ointment into the upper eyelid. (Patient not taking: Reported on 11/21/2023)   nicotine  (NICODERM CQ  - DOSED IN MG/24 HOURS) 14 mg/24hr patch Place 1 patch (14 mg total) onto the skin daily. (Patient not taking: Reported on 11/21/2023)    oxyCODONE -acetaminophen  (PERCOCET/ROXICET) 5-325 MG tablet Take 1 tablet by mouth every 6 (six) hours as needed for severe pain (pain score 7-10). (Patient not taking: Reported on 11/21/2023)   traMADol  (ULTRAM ) 50 MG tablet Take 1 tablet (50 mg total) by mouth 3 (three) times daily as needed. (Patient not taking: Reported on 07/24/2023)   valACYclovir  (VALTREX ) 1000 MG tablet Take 1,000 mg by mouth 2 (two) times daily. (Patient not taking: Reported on 07/24/2023)   [DISCONTINUED] ramipril  (ALTACE ) 10 MG capsule TAKE 2 CAPSULES BY MOUTH EVERY DAY WITH BREAKFAST   No facility-administered encounter medications on file as of 11/21/2023.    PAST MEDICAL HISTORY: Past Medical History:  Diagnosis Date   AAA (abdominal aortic aneurysm) (HCC)    3.3 cm in 2012   ABDOMINAL AORTIC ANEURYSM  11/07/2009   Diameter of 3.3 cm by CT in 12/2010    Anxiety    ASCVD (arteriosclerotic cardiovascular disease)    LAD stent in 1999; RCA bare-metal stent in 12/2007, LAD stent patent, 50-60% proximal Cx lesion treated medically; 09/2012: CABG x3, LIMA to LAD, Sequential SVG to ramus intermediate and OM branch with EVH via right thigh   Coronary artery disease    Depression    Diabetes mellitus    DIABETES MELLITUS, TYPE II 08/09/2010   no insulin      ED (erectile dysfunction)    Gastric ulcer    Hepatic steatosis    Hyperlipidemia    HYPERLIPIDEMIA 08/09/2010   Lipid profile in 07/2010:144, 124, 34, 86; 01/2012:93, 82, 34, 43    Hypertension    Insomnia    MI (myocardial infarction) (HCC)    Nephrolithiasis    NEPHROLITHIASIS, RECURRENT 08/09/2010   Obstruction of the ureter on CT scan of the abdomen resulting in right hydronephrosis in 12/2010; bladder outlet obstruction prior to that.  CT also revealed atherosclerotic changes in the abdominal arterial vessels, hepatic granuloma and calcification of the prostate.    S/P CABG x 3 10/07/2012   LIMA to LAD, Sequential SVG to ramus intermediate and OM branch with EVH  via right thigh   THROMBOCYTOPENIA 08/09/2010   Platelets 88-114,000 over the past 4 years; prior evaluation by Dr. Nila Barth without a specific etiology identified.  01/2012: Normal CBC ex platelets-120    Thrombocytopenia (HCC)    Chronic; evaluated by Dr. Nila Barth in 2007   Tobacco abuse     PAST SURGICAL HISTORY: Past Surgical History:  Procedure Laterality Date   COLONOSCOPY N/A 01/19/2021   Procedure: COLONOSCOPY;  Surgeon: Ruby Corporal, MD;  [facility];  Service: Endoscopy;  Laterality: N/A;  12:30   CORONARY ANGIOPLASTY WITH STENT PLACEMENT     1999, 2009   CORONARY ARTERY BYPASS GRAFT N/A 10/07/2012   Procedure: CORONARY ARTERY BYPASS GRAFTING (CABG);  Surgeon: Gardenia Jump, MD;  Location: [facility] OR;  Service: Open Heart Surgery;  Laterality: N/A;   CYSTOSCOPY WITH LITHOLAPAXY N/A 10/29/2019   Procedure: CYSTOSCOPY WITH LITHOLAPAXY;  Surgeon: Homero Luster, MD;  Location: [facility];  Service: Urology;  Laterality: N/A;   CYSTOSCOPY WITH RETROGRADE PYELOGRAM, URETEROSCOPY AND STENT PLACEMENT Left 08/30/2015   Procedure: CYSTOSCOPY LEFT RETROGRADE PYELOGRAM  LEFT   URETEROSCOPY,  LITHOLAPAXY;  Surgeon: Homero Luster, MD;  Location: [facility];  Service: Urology;  Laterality: Left;   ESOPHAGOGASTRODUODENOSCOPY  04/04/2012   Procedure: ESOPHAGOGASTRODUODENOSCOPY (EGD);  Surgeon: Ruby Corporal, MD;  [facility];  Service: Endoscopy;  Laterality: N/A;  830   ESOPHAGOGASTRODUODENOSCOPY  07/25/2012   Procedure: ESOPHAGOGASTRODUODENOSCOPY (EGD);  Surgeon: Ruby Corporal, MD;  [facility];  Service: Endoscopy;  Laterality: N/A;  830   HOLMIUM LASER APPLICATION Left 08/30/2015   Procedure: HOLMIUM LASER APPLICATION;  Surgeon: Homero Luster, MD;  Location: [facility];  Service: Urology;  Laterality: Left;   INGUINAL HERNIA REPAIR     INTRAOPERATIVE TRANSESOPHAGEAL ECHOCARDIOGRAM N/A 10/07/2012   Procedure: INTRAOPERATIVE TRANSESOPHAGEAL ECHOCARDIOGRAM;  Surgeon: Gardenia Jump, MD;  Location: [facility] OR;  Service: Open Heart Surgery;  Laterality: N/A;   LEFT HEART CATHETERIZATION WITH CORONARY ANGIOGRAM N/A 10/03/2012   Procedure: LEFT HEART CATHETERIZATION WITH CORONARY ANGIOGRAM;  Surgeon: Eilleen Grates, MD;  Location: [facility] CATH LAB;  Service: Cardiovascular;  Laterality: N/A;   LITHOTRIPSY     POLYPECTOMY  01/19/2021   Procedure:  POLYPECTOMY;  Surgeon: Ruby Corporal, MD;  [facility];  Service: Endoscopy;;    ALLERGIES: Allergies  Allergen Reactions   Meperidine  Hcl Other (See Comments)    Was mixed with phenergan and turned blood vessels red.    Promethazine Hcl Other (See Comments)    Was mixed with the Demerol  and caused veins to turn red.     FAMILY HISTORY: Family History  Problem Relation Age of Onset   Diabetes Mother    Ovarian cancer Sister     SOCIAL HISTORY: Social History   Tobacco Use   Smoking status: Every Day    Current packs/day: 0.00    Average packs/day: 0.5 packs/day for 40.0 years (20.0 ttl pk-yrs)    Types: Cigarettes    Start date: 07/09/1973    Last attempt to quit: 07/09/2013    Years since quitting: 10.3   Smokeless tobacco: Never   Tobacco comments:    2-3cigs. daily   Vaping Use   Vaping status: Never Used  Substance Use Topics   Alcohol use: No    Alcohol/week: 0.0 standard drinks of alcohol   Drug use: No   Social History   Social History Narrative   No regular exercise   Are you right handed or left handed? Right   Are you currently employed ?    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   Who lives with you? family   What type of home do you live in: 1 story or 2 story? one    Caffiene  2 cups a day      Objective:  Vital Signs:  BP 133/73   Pulse 66   Ht 5\' 11"  (1.803 m)   Wt 168 lb (76.2 kg)   SpO2 96%   BMI 23.43 kg/m   General: No acute distress.  Patient appears well-groomed.   Head:  Normocephalic/atraumatic Neck: supple Lungs:   Clear to auscultation bilaterally Neurological Exam: alert and oriented.  Speech fluent and not dysarthric, language intact.  CN II-XII intact. Bulk and tone normal, muscle strength 5/5 throughout.  Sensation to light touch intact.  Deep tendon reflexes 2+ throughout, toes downgoing.  Finger to nose testing intact.  Gait normal, Romberg negative.   Labs and Imaging review: No new results  Previously reviewed results: Lab Results  Component Value Date    HGBA1C 5.8 (H) 03/15/2021      Recent Labs[] Expand by Default       Lab Results  Component Value Date    TSH 0.62 04/13/2008      05/16/23: CMP significant for glucose of 135 CBC w/ diff significant for plts 125 (chronic)   Imaging: CT head and CTA head and neck (05/16/2023): IMPRESSION: 1. No acute intracranial process. 2. No intracranial large vessel occlusion or significant stenosis. 3. Moderate stenosis at the origin of the right vertebral artery, which is dominant. No other hemodynamically significant stenosis in the neck. 4. Aortic atherosclerosis.  Assessment/Plan:  This is [name], a 73 y.o. male with right facial pain after vesicular rash, most consistent with post-herpetic neuralgia. Symptoms are not provoke to suggest another etiology such as trigeminal neuralgia. Unfortunately, his pain has been slow to resolve, but is currently bearable on gabapentin , so he defers changes for now.   Plan: -Patient will see eye doctor -Continue gabapentin  300/300/300/600 mg -Patient will call with new or worsening symptoms  Return to clinic in 6 months  Total time spent reviewing records, interview, history/exam, documentation, and coordination of care on day of encounter:  30 min  Rommie Coats, MD

## 2023-11-16 ENCOUNTER — Other Ambulatory Visit: Payer: Self-pay | Admitting: Cardiology

## 2023-11-21 ENCOUNTER — Ambulatory Visit: Payer: Medicare HMO | Admitting: Neurology

## 2023-11-21 ENCOUNTER — Encounter: Payer: Self-pay | Admitting: Neurology

## 2023-11-21 VITALS — BP 133/73 | HR 66 | Ht 71.0 in | Wt 168.0 lb

## 2023-11-21 DIAGNOSIS — R519 Headache, unspecified: Secondary | ICD-10-CM

## 2023-11-21 DIAGNOSIS — B0229 Other postherpetic nervous system involvement: Secondary | ICD-10-CM

## 2023-11-21 NOTE — Patient Instructions (Signed)
 After discussion, you decided to keep gabapentin  the same. You will take 300 mg/300 mg/300 mg/600 mg at night.   If this is not working or symptoms are changing, please let me know.  It is probably a good idea to see your eye doctor just to get a good eye exam to make sure there is no eye involvement.  Please let me know if you have any questions or concerns in the meantime.  The physicians and staff at [facility] are committed to providing excellent care. You may receive a survey requesting feedback about your experience at our office. We strive to receive "very good" responses to the survey questions. If you feel that your experience would prevent you from giving the office a "very good " response, please contact our office to try to remedy the situation. We may be reached at [phone]. Thank you for taking the time out of your busy day to complete the survey.  Rommie Coats, MD [facility]

## 2023-12-05 ENCOUNTER — Ambulatory Visit: Payer: Medicare HMO | Admitting: Urology

## 2023-12-12 ENCOUNTER — Encounter: Payer: Self-pay | Admitting: Emergency Medicine

## 2023-12-12 ENCOUNTER — Ambulatory Visit
Admission: EM | Admit: 2023-12-12 | Discharge: 2023-12-12 | Disposition: A | Discharge status: Home / Self Care | Attending: Nurse Practitioner | Admitting: Nurse Practitioner

## 2023-12-12 ENCOUNTER — Other Ambulatory Visit: Payer: Self-pay

## 2023-12-12 DIAGNOSIS — B349 Viral infection, unspecified: Secondary | ICD-10-CM

## 2023-12-12 DIAGNOSIS — R8290 Unspecified abnormal findings in urine: Secondary | ICD-10-CM

## 2023-12-12 DIAGNOSIS — E860 Dehydration: Secondary | ICD-10-CM | POA: Diagnosis not present

## 2023-12-12 LAB — POCT URINALYSIS DIP (MANUAL ENTRY)
Glucose, UA: NEGATIVE mg/dL
Ketones, POC UA: NEGATIVE mg/dL
Leukocytes, UA: NEGATIVE
Nitrite, UA: NEGATIVE
Protein Ur, POC: 100 mg/dL — AB
Spec Grav, UA: >=1.03 — AB (ref 1.010–1.025)
Urobilinogen, UA: 2 U/dL — AB
pH, UA: 5.5 (ref 5.0–8.0)

## 2023-12-12 LAB — POC SARS CORONAVIRUS 2 AG -  ED: SARS Coronavirus 2 Ag: NEGATIVE

## 2023-12-12 MED ORDER — FLUTICASONE PROPIONATE 50 MCG/ACT NA SUSP
2.0000 | Freq: Every day | NASAL | 0 refills | Status: AC
Start: 2023-12-12 — End: ?

## 2023-12-12 NOTE — Discharge Instructions (Addendum)
 Complete blood count and metabolic panel are pending as well as a urine culture.  You will be contacted if the pending test results are abnormal.  You will also access to your results via MyChart. Try to drink at least 8-10 8 ounce glasses of water  daily.  Recommend Pedialyte or Gatorade to prevent further dehydration. You may continue over-the-counter Tylenol  as needed for pain or discomfort. Make sure you are allowing for plenty of rest. Go to the emergency department immediately if you are experiencing worsening dizziness, fatigue, chest pain, shortness of breath, difficulty breathing, or other concerns. Recommend follow-up with your primary care physician within the next 3 to 5 days. Follow-up as needed.

## 2023-12-12 NOTE — ED Triage Notes (Signed)
 Chills, fever, runny nose since Sunday.

## 2023-12-12 NOTE — ED Provider Notes (Signed)
 [facility]    CSN: [id number] Arrival date & time: 12/12/23  0802      History   Chief Complaint Chief Complaint  Patient presents with   Fever   Nasal Congestion    HPI [name] is a 73 y.o. male.   The history is provided by the patient.   Patient presents with a 3 to 4-day history of fever, nasal congestion, body aches, dizziness, and generalized fatigue.  Patient states he does not recall how high his fever has been at home.  He also does not recall when he had his last fever.  Patient states that he did go out on the golf course yesterday and played golf, noting that he drank 2 bottles of water  and a bottle of Gatorade.  Patient denies headache, ear pain, nasal congestion, runny nose, chest pain, abdominal pain, nausea, vomiting, diarrhea, or rash.  Patient states he has been taking Tylenol  for his symptoms.  Past Medical History:  Diagnosis Date   AAA (abdominal aortic aneurysm) (HCC)    3.3 cm in 2012   ABDOMINAL AORTIC ANEURYSM 11/07/2009   Diameter of 3.3 cm by CT in 12/2010    Anxiety    ASCVD (arteriosclerotic cardiovascular disease)    LAD stent in 1999; RCA bare-metal stent in 12/2007, LAD stent patent, 50-60% proximal Cx lesion treated medically; 09/2012: CABG x3, LIMA to LAD, Sequential SVG to ramus intermediate and OM branch with EVH via right thigh   Coronary artery disease    Depression    Diabetes mellitus    DIABETES MELLITUS, TYPE II 08/09/2010   no insulin      ED (erectile dysfunction)    Gastric ulcer    Hepatic steatosis    Hyperlipidemia    HYPERLIPIDEMIA 08/09/2010   Lipid profile in 07/2010:144, 124, 34, 86; 01/2012:93, 82, 34, 43    Hypertension    Insomnia    MI (myocardial infarction) (HCC)    Nephrolithiasis    NEPHROLITHIASIS, RECURRENT 08/09/2010   Obstruction of the ureter on CT scan of the abdomen resulting in right hydronephrosis in 12/2010; bladder outlet obstruction prior to that.  CT also revealed atherosclerotic  changes in the abdominal arterial vessels, hepatic granuloma and calcification of the prostate.    S/P CABG x 3 10/07/2012   LIMA to LAD, Sequential SVG to ramus intermediate and OM branch with EVH via right thigh   THROMBOCYTOPENIA 08/09/2010   Platelets 88-114,000 over the past 4 years; prior evaluation by Dr. Nila Barth without a specific etiology identified.  01/2012: Normal CBC ex platelets-120    Thrombocytopenia (HCC)    Chronic; evaluated by Dr. Nila Barth in 2007   Tobacco abuse     Patient Active Problem List   Diagnosis Date Noted   Left upper quadrant abdominal pain 06/15/2021   Gross hematuria 06/15/2021   BPH with urinary obstruction 06/15/2021   Weakness    CAP (community acquired pneumonia) 03/15/2021   Ureteral stone 07/09/2013   Hypertension 10/02/2012   Type 2 diabetes mellitus with hyperlipidemia (HCC) 08/09/2010   HYPERLIPIDEMIA 08/09/2010   THROMBOCYTOPENIA 08/09/2010   Arteriosclerotic cardiovascular disease (ASCVD) 08/09/2010   Renal stones 08/09/2010   ERECTILE DYSFUNCTION, ORGANIC 08/09/2010   INSOMNIA 08/09/2010   Abdominal aortic aneurysm (HCC) 11/07/2009    Past Surgical History:  Procedure Laterality Date   COLONOSCOPY N/A 01/19/2021   Procedure: COLONOSCOPY;  Surgeon: Ruby Corporal, MD;  Location: [facility];  Service: Endoscopy;  Laterality: N/A;  12:30  CORONARY ANGIOPLASTY WITH STENT PLACEMENT     1999, 2009   CORONARY ARTERY BYPASS GRAFT N/A 10/07/2012   Procedure: CORONARY ARTERY BYPASS GRAFTING (CABG);  Surgeon: Gardenia Jump, MD;  Location: [facility] OR;  Service: Open Heart Surgery;  Laterality: N/A;   CYSTOSCOPY WITH LITHOLAPAXY N/A 10/29/2019   Procedure: CYSTOSCOPY WITH LITHOLAPAXY;  Surgeon: Homero Luster, MD;  Location: [facility];  Service: Urology;  Laterality: N/A;   CYSTOSCOPY WITH RETROGRADE PYELOGRAM, URETEROSCOPY AND STENT PLACEMENT Left 08/30/2015   Procedure: CYSTOSCOPY LEFT RETROGRADE PYELOGRAM  LEFT   URETEROSCOPY,  LITHOLAPAXY;   Surgeon: Homero Luster, MD;  Location: [facility];  Service: Urology;  Laterality: Left;   ESOPHAGOGASTRODUODENOSCOPY  04/04/2012   Procedure: ESOPHAGOGASTRODUODENOSCOPY (EGD);  Surgeon: Ruby Corporal, MD;  Location: [facility];  Service: Endoscopy;  Laterality: N/A;  830   ESOPHAGOGASTRODUODENOSCOPY  07/25/2012   Procedure: ESOPHAGOGASTRODUODENOSCOPY (EGD);  Surgeon: Ruby Corporal, MD;  Location: [facility];  Service: Endoscopy;  Laterality: N/A;  830   HOLMIUM LASER APPLICATION Left 08/30/2015   Procedure: HOLMIUM LASER APPLICATION;  Surgeon: Homero Luster, MD;  Location: [facility];  Service: Urology;  Laterality: Left;   INGUINAL HERNIA REPAIR     INTRAOPERATIVE TRANSESOPHAGEAL ECHOCARDIOGRAM N/A 10/07/2012   Procedure: INTRAOPERATIVE TRANSESOPHAGEAL ECHOCARDIOGRAM;  Surgeon: Gardenia Jump, MD;  Location: [facility] OR;  Service: Open Heart Surgery;  Laterality: N/A;   LEFT HEART CATHETERIZATION WITH CORONARY ANGIOGRAM N/A 10/03/2012   Procedure: LEFT HEART CATHETERIZATION WITH CORONARY ANGIOGRAM;  Surgeon: Eilleen Grates, MD;  Location: [facility];  Service: Cardiovascular;  Laterality: N/A;   LITHOTRIPSY     POLYPECTOMY  01/19/2021   Procedure: POLYPECTOMY;  Surgeon: Ruby Corporal, MD;  Location: [facility];  Service: Endoscopy;;       Home Medications    Prior to Admission medications   Medication Sig Start Date End Date Taking? Authorizing Provider  fluticasone  (FLONASE ) 50 MCG/ACT nasal spray Place 2 sprays into both nostrils daily. 12/12/23  Yes Leath-Warren, Belen Bowers, NP  acetaminophen  (TYLENOL ) 500 MG tablet Take 500 mg by mouth at bedtime.    [provider]  amLODipine  (NORVASC ) 10 MG tablet TAKE 1 TABLET BY MOUTH EVERY DAY 03/25/23   Laurann Pollock, MD  aspirin  EC 81 MG tablet Take 1 tablet (81 mg total) by mouth daily. 01/20/21   Ruby Corporal, MD  atorvastatin  (LIPITOR ) 40 MG tablet TAKE 1 TABLET BY MOUTH EVERY DAY 02/26/22   Laurann Pollock, MD  cetirizine  (ZYRTEC) 10 MG tablet Take 10 mg by mouth daily. Or zyzal    [provider]  finasteride  (PROSCAR ) 5 MG tablet TAKE 1 TABLET (5 MG TOTAL) BY MOUTH DAILY. 06/20/23   Homero Luster, MD  gabapentin  (NEURONTIN ) 300 MG capsule Take 1 capsule (300 mg) in the morning, take 1 capsule (300 mg) midday, take 1 capsule (300 mg) in the afternoon, and take 2 capsules (600 mg) in the evening 08/15/23   Ellene Gustin, MD  hydrOXYzine  (ATARAX ) 10 MG tablet Take 1 tablet (10 mg total) by mouth at bedtime as needed. 06/19/23   Corbin Dess, PA-C  levocetirizine (XYZAL ) 5 MG tablet Take 1 tablet (5 mg total) by mouth every evening. 08/20/21   Adolph Hoop, PA-C  metFORMIN (GLUCOPHAGE) 1000 MG tablet Take 1,000 mg by mouth 2 (two) times daily.    [provider]  metoprolol  tartrate (LOPRESSOR ) 25 MG tablet Take 1 tablet (25 mg total) by mouth 2 (  two) times daily. 10/09/22   Laurann Pollock, MD  ONETOUCH ULTRA test strip USE TO TEST SUGAR THREE TIMES DAILY 12/21/20   [provider]  ramipril  (ALTACE ) 10 MG capsule TAKE 2 CAPSULES BY MOUTH EVERY DAY WITH BREAKFAST 11/18/23   Laurann Pollock, MD  tadalafil  (CIALIS ) 5 MG tablet Take 5 mg by mouth daily as needed for erectile dysfunction.    [provider]  tamsulosin  (FLOMAX ) 0.4 MG CAPS capsule TAKE 1 CAPSULE BY MOUTH EVERY DAY 06/20/23   Homero Luster, MD  traMADol  (ULTRAM ) 50 MG tablet Take 1 tablet (50 mg total) by mouth 3 (three) times daily as needed. Patient not taking: Reported on 07/24/2023 06/02/23   Corbin Dess, PA-C  valACYclovir  (VALTREX ) 1000 MG tablet Take 1,000 mg by mouth 2 (two) times daily. Patient not taking: Reported on 07/24/2023    [provider]    Family History Family History  Problem Relation Age of Onset   Diabetes Mother    Ovarian cancer Sister     Social History Social History   Tobacco Use   Smoking status: Every Day    Current packs/day: 0.00    Average packs/day:  0.5 packs/day for 40.0 years (20.0 ttl pk-yrs)    Types: Cigarettes    Start date: 07/09/1973    Last attempt to quit: 07/09/2013    Years since quitting: 10.4   Smokeless tobacco: Never   Tobacco comments:    2-3cigs. daily   Vaping Use   Vaping status: Never Used  Substance Use Topics   Alcohol use: No    Alcohol/week: 0.0 standard drinks of alcohol   Drug use: No     Allergies   Meperidine  hcl and Promethazine hcl   Review of Systems Review of Systems Per HPI  Physical Exam Triage Vital Signs ED Triage Vitals  Encounter Vitals Group     BP 12/12/23 0819 100/63     Girls Systolic BP Percentile --      Girls Diastolic BP Percentile --      Boys Systolic BP Percentile --      Boys Diastolic BP Percentile --      Pulse Rate 12/12/23 0819 97     Resp 12/12/23 0819 18     Temp 12/12/23 0819 98.4 F (36.9 C)     Temp Source 12/12/23 0819 Oral     SpO2 12/12/23 0819 94 %     Weight --      Height --      Head Circumference --      Peak Flow --      Pain Score 12/12/23 0820 0     Pain Loc --      Pain Education --      Exclude from Growth Chart --    Orthostatic VS for the past 24 hrs:  BP- Lying Pulse- Lying BP- Sitting Pulse- Sitting BP- Standing at 0 minutes Pulse- Standing at 0 minutes  12/12/23 0839 106/63 83 99/64 86 91/57 92    Updated Vital Signs BP 100/63 (BP Location: Right Arm)   Pulse 97   Temp 98.4 F (36.9 C) (Oral)   Resp 18   SpO2 94%   Visual Acuity Right Eye Distance:   Left Eye Distance:   Bilateral Distance:    Right Eye Near:   Left Eye Near:    Bilateral Near:     Physical Exam Vitals and nursing note reviewed.  Constitutional:  General: He is not in acute distress.    Appearance: Normal appearance.  HENT:     Head: Normocephalic.     Right Ear: Tympanic membrane, ear canal and external ear normal.     Left Ear: Tympanic membrane, ear canal and external ear normal.     Nose: Rhinorrhea present. Rhinorrhea is clear.      Right Turbinates: Enlarged and swollen.     Left Turbinates: Enlarged and swollen.     Right Sinus: No maxillary sinus tenderness or frontal sinus tenderness.     Left Sinus: No maxillary sinus tenderness or frontal sinus tenderness.     Mouth/Throat:     Lips: Pink.     Mouth: Mucous membranes are moist.     Pharynx: Postnasal drip present. No pharyngeal swelling, oropharyngeal exudate, posterior oropharyngeal erythema or uvula swelling.     Comments: Cobblestoning present to posterior oropharynx   Eyes:     Extraocular Movements: Extraocular movements intact.     Conjunctiva/sclera: Conjunctivae normal.     Pupils: Pupils are equal, round, and reactive to light.    Cardiovascular:     Rate and Rhythm: Normal rate and regular rhythm.     Pulses: Normal pulses.     Heart sounds: Normal heart sounds.  Pulmonary:     Effort: Pulmonary effort is normal.     Breath sounds: Normal breath sounds.  Abdominal:     Palpations: Abdomen is soft.     Tenderness: There is no abdominal tenderness.   Musculoskeletal:     Cervical back: Normal range of motion.   Skin:    General: Skin is warm and dry.   Neurological:     General: No focal deficit present.     Mental Status: He is alert and oriented to person, place, and time.   Psychiatric:        Mood and Affect: Mood normal.        Behavior: Behavior normal.      UC Treatments / Results  Labs (all labs ordered are listed, but only abnormal results are displayed) Labs Reviewed  POCT URINALYSIS DIP (MANUAL ENTRY) - Abnormal; Notable for the following components:      Result Value   Color, UA straw (*)    Bilirubin, UA small (*)    Spec Grav, UA >=1.030 (*)    Blood, UA trace-intact (*)    Protein Ur, POC =100 (*)    Urobilinogen, UA 2.0 (*)    All other components within normal limits  URINE CULTURE  BASIC METABOLIC PANEL WITH GFR  CBC WITH DIFFERENTIAL/PLATELET  POC SARS CORONAVIRUS 2 AG -  ED    EKG   Radiology No  results found.  Procedures Procedures (including critical care time)  Medications Ordered in UC Medications - No data to display  Initial Impression / Assessment and Plan / UC Course  I have reviewed the triage vital signs and the nursing notes.  Pertinent labs & imaging results that were available during my care of the patient were reviewed by me and considered in my medical decision making (see chart for details).  The COVID test was negative.  Urinalysis does show protein, trace blood, and elevated specific gravity.  EKG shows normal sinus rhythm with T wave abnormality, compared to prior EKGs, no STEMI.  Orthostatic vital signs were negative, patient's BP is soft however.  Suspect patient may be dehydrated which is causing his hypotension.  Urine culture is pending.  CBC and BMP  are pending pending for safety.  In the interim, will provide symptomatic treatment with fluticasone  50 mcg nasal spray.  Supportive care recommendations were provided and discussed with the patient to include hydration, over-the-counter analgesics, and rest.  Discussed indications with patient regarding ER follow-up.  Patient was in agreement with this plan of care and verbalizes understanding.  All questions were answered.  Patient stable for discharge.   Final Clinical Impressions(s) / UC Diagnoses   Final diagnoses:  Dehydration  Abnormal urinalysis  Viral illness     Discharge Instructions      Complete blood count and metabolic panel are pending as well as a urine culture.  You will be contacted if the pending test results are abnormal.  You will also access to your results via MyChart. Try to drink at least 8-10 8 ounce glasses of water  daily.  Recommend Pedialyte or Gatorade to prevent further dehydration. You may continue over-the-counter Tylenol  as needed for pain or discomfort. Make sure you are allowing for plenty of rest. Go to the emergency department immediately if you are experiencing  worsening dizziness, fatigue, chest pain, shortness of breath, difficulty breathing, or other concerns. Recommend follow-up with your primary care physician within the next 3 to 5 days. Follow-up as needed.     ED Prescriptions     Medication Sig Dispense Auth. Provider   fluticasone  (FLONASE ) 50 MCG/ACT nasal spray Place 2 sprays into both nostrils daily. 16 g Leath-Warren, Belen Bowers, NP      PDMP not reviewed this encounter.   Hardy Lia, NP 12/12/23 [phone]

## 2023-12-13 LAB — BASIC METABOLIC PANEL WITH GFR
BUN/Creatinine Ratio: 18 (ref 10–24)
BUN: 26 mg/dL (ref 8–27)
CO2: 17 mmol/L — ABNORMAL LOW (ref 20–29)
Calcium: 9.3 mg/dL (ref 8.6–10.2)
Chloride: 102 mmol/L (ref 96–106)
Creatinine, Ser: 1.43 mg/dL — ABNORMAL HIGH (ref 0.76–1.27)
Glucose: 147 mg/dL — ABNORMAL HIGH (ref 70–99)
Potassium: 4.4 mmol/L (ref 3.5–5.2)
Sodium: 137 mmol/L (ref 134–144)
eGFR: 53 mL/min/{1.73_m2} — ABNORMAL LOW (ref >59)

## 2023-12-13 LAB — CBC WITH DIFFERENTIAL/PLATELET
Basophils Absolute: 0 10*3/uL (ref 0.0–0.2)
Basos: 1 %
EOS (ABSOLUTE): 0 10*3/uL (ref 0.0–0.4)
Eos: 1 %
Hematocrit: 43.4 % (ref 37.5–51.0)
Hemoglobin: 13.8 g/dL (ref 13.0–17.7)
Immature Grans (Abs): 0 10*3/uL (ref 0.0–0.1)
Immature Granulocytes: 0 %
Lymphocytes Absolute: 0.7 10*3/uL (ref 0.7–3.1)
Lymphs: 15 %
MCH: 28.6 pg (ref 26.6–33.0)
MCHC: 31.8 g/dL (ref 31.5–35.7)
MCV: 90 fL (ref 79–97)
Monocytes Absolute: 0.5 10*3/uL (ref 0.1–0.9)
Monocytes: 11 %
Neutrophils Absolute: 3.4 10*3/uL (ref 1.4–7.0)
Neutrophils: 72 %
Platelets: 81 10*3/uL — CL (ref 150–450)
RBC: 4.83 x10E6/uL (ref 4.14–5.80)
RDW: 13 % (ref 11.6–15.4)
WBC: 4.8 10*3/uL (ref 3.4–10.8)

## 2023-12-13 LAB — URINE CULTURE: Culture: NO GROWTH

## 2023-12-16 ENCOUNTER — Ambulatory Visit (HOSPITAL_COMMUNITY): Payer: Self-pay

## 2023-12-19 ENCOUNTER — Ambulatory Visit: Admitting: Urology

## 2023-12-19 ENCOUNTER — Encounter: Payer: Self-pay | Admitting: Urology

## 2023-12-19 ENCOUNTER — Ambulatory Visit (HOSPITAL_COMMUNITY)
Admission: RE | Admit: 2023-12-19 | Discharge: 2023-12-19 | Disposition: A | Discharge status: Home / Self Care | Source: Ambulatory Visit | Attending: Urology | Admitting: Urology

## 2023-12-19 ENCOUNTER — Other Ambulatory Visit: Payer: Self-pay

## 2023-12-19 VITALS — BP 123/62 | HR 79

## 2023-12-19 DIAGNOSIS — N200 Calculus of kidney: Secondary | ICD-10-CM

## 2023-12-19 DIAGNOSIS — N138 Other obstructive and reflux uropathy: Secondary | ICD-10-CM | POA: Diagnosis not present

## 2023-12-19 DIAGNOSIS — R350 Frequency of micturition: Secondary | ICD-10-CM

## 2023-12-19 DIAGNOSIS — R3129 Other microscopic hematuria: Secondary | ICD-10-CM

## 2023-12-19 DIAGNOSIS — I878 Other specified disorders of veins: Secondary | ICD-10-CM | POA: Diagnosis not present

## 2023-12-19 DIAGNOSIS — N2889 Other specified disorders of kidney and ureter: Secondary | ICD-10-CM | POA: Diagnosis not present

## 2023-12-19 DIAGNOSIS — N401 Enlarged prostate with lower urinary tract symptoms: Secondary | ICD-10-CM | POA: Diagnosis not present

## 2023-12-19 MED ORDER — TADALAFIL 5 MG PO TABS: 5.0000 mg | ORAL_TABLET | Freq: Every day | ORAL | 11 refills | Status: AC | PRN

## 2023-12-19 MED ORDER — FINASTERIDE 5 MG PO TABS: 5.0000 mg | ORAL_TABLET | Freq: Every day | ORAL | 3 refills | Status: AC

## 2023-12-19 MED ORDER — TAMSULOSIN HCL 0.4 MG PO CAPS: 0.4000 mg | ORAL_CAPSULE | Freq: Every day | ORAL | 3 refills | Status: AC

## 2023-12-19 NOTE — Progress Notes (Signed)
 Subjective:  1. BPH with urinary obstruction   2. Urinary frequency   3. Renal stones   4. Microhematuria      [name] returns in f/u.  He had a cystolithalopaxy on 10/29/19 for a 2.6cm stone.  He is doing well without hematuria and minimal LUTS with an IPSS of 1.    He has some BPH with BOO and has had a good response to tamsulosin  in the past but his LUTS haven't been too severe.    Pt is a 73YO male with h/o renal and bladder stones who presents with c/o 4 day h/o left sided abdominal pain radiating to the lower abdomen, gross hematuria and passage of clots. No dysuria, burning. Pt denies fever, chills, back pain, nausea, vomiting. Pt on ASA daily. Otherwise, has been doing well with LUTS on Flomax  daily. Pt also requests change of Rxs as tamsolosin will become tier 2 with his new insurance. IPPS score=4 PVR=19ml  CT shows stable renal stones with the largest in the LLP about 7-41mm.  No hydro is noted.  He has prostate enlargement but no obvious bladder lesions.  There is some interval enlargement in the AAA.    08/03/21: [name] returns today in f/u for cystoscopy for further evaluation of recent gross hematuria.   He has been on finasteride  with the tamsulosin  as the bleeding was presumed to be prostatic pending the cystoscopy.    8/3/23Alvie Thompson returns today in f/u.  He passed a small stone Tuesday.  He has no pain currently or further gross hematuria.   His UA today has 6-10 RBC's and >30 RBC's with few bacteria.  He remains on tamsulosin  and finasteride  and is voiding well with an IPSS of 5.  Cysto in February for gross hematuria just showed trilobar hyperplasia and some grit in the bladder.   He has known renal stones.    His PSA is 0.6.   2/82/24: [name] returns in f/u.  He has passed a couple of small stones in the last 6 months.  He has had one episode of hematuria.  His UA today is clear. He is voiding ok with an IPSS of 6 with nocturia x 1 and some intermittency.   HE had a KUB that shows stable  to minimally increased 11-30mm LLP stone.  He remains on tadalafil  for ED and tamsulosin  and finasteride  for BPH.     12/19/23: [name] returns today in f/u.  He has a history of stones, ED and BPH with bOO.  He has been on finasteride , tamsulosin  and tadalafil .  His most recent PSA was 0.3 on 08/01/23.  He had KUB on 12/15/23 and has a stable 14x82mm LLP stone.  UA on 12/12/23 was unremarkable and his Cr was 1.43 which is up for him but he was being seen for dehydration. [name] is voiding well with nocturia x 1.  He has had no hematuria.       ROS:  ROS:  A complete review of systems was performed.  All systems are negative except for pertinent findings as noted.   Review of Systems  Neurological:        Right scalp pain from post herpetic neuralgia.   All other systems reviewed and are negative.   Allergies  Allergen Reactions   Meperidine  Hcl Other (See Comments)    Was mixed with phenergan and turned blood vessels red.    Promethazine Hcl Other (See Comments)    Was mixed with the Demerol  and caused veins to  turn red.     Outpatient Encounter Medications as of 12/19/2023  Medication Sig   acetaminophen  (TYLENOL ) 500 MG tablet Take 500 mg by mouth at bedtime.   amLODipine  (NORVASC ) 10 MG tablet TAKE 1 TABLET BY MOUTH EVERY DAY   aspirin  EC 81 MG tablet Take 1 tablet (81 mg total) by mouth daily.   atorvastatin  (LIPITOR ) 40 MG tablet TAKE 1 TABLET BY MOUTH EVERY DAY   cetirizine (ZYRTEC) 10 MG tablet Take 10 mg by mouth daily. Or zyzal   fluticasone  (FLONASE ) 50 MCG/ACT nasal spray Place 2 sprays into both nostrils daily.   gabapentin  (NEURONTIN ) 300 MG capsule Take 1 capsule (300 mg) in the morning, take 1 capsule (300 mg) midday, take 1 capsule (300 mg) in the afternoon, and take 2 capsules (600 mg) in the evening   hydrOXYzine  (ATARAX ) 10 MG tablet Take 1 tablet (10 mg total) by mouth at bedtime as needed.   levocetirizine (XYZAL ) 5 MG tablet Take 1 tablet (5 mg total) by mouth every  evening.   metFORMIN (GLUCOPHAGE) 1000 MG tablet Take 1,000 mg by mouth 2 (two) times daily.   metoprolol  tartrate (LOPRESSOR ) 25 MG tablet Take 1 tablet (25 mg total) by mouth 2 (two) times daily.   ONETOUCH ULTRA test strip USE TO TEST SUGAR THREE TIMES DAILY   ramipril  (ALTACE ) 10 MG capsule TAKE 2 CAPSULES BY MOUTH EVERY DAY WITH BREAKFAST   [DISCONTINUED] finasteride  (PROSCAR ) 5 MG tablet TAKE 1 TABLET (5 MG TOTAL) BY MOUTH DAILY.   [DISCONTINUED] tadalafil  (CIALIS ) 5 MG tablet Take 5 mg by mouth daily as needed for erectile dysfunction.   [DISCONTINUED] tamsulosin  (FLOMAX ) 0.4 MG CAPS capsule TAKE 1 CAPSULE BY MOUTH EVERY DAY   finasteride  (PROSCAR ) 5 MG tablet Take 1 tablet (5 mg total) by mouth daily.   tadalafil  (CIALIS ) 5 MG tablet Take 1 tablet (5 mg total) by mouth daily as needed for erectile dysfunction.   tamsulosin  (FLOMAX ) 0.4 MG CAPS capsule Take 1 capsule (0.4 mg total) by mouth daily.   traMADol  (ULTRAM ) 50 MG tablet Take 1 tablet (50 mg total) by mouth 3 (three) times daily as needed. (Patient not taking: Reported on 12/19/2023)   valACYclovir  (VALTREX ) 1000 MG tablet Take 1,000 mg by mouth 2 (two) times daily. (Patient not taking: Reported on 12/19/2023)   No facility-administered encounter medications on file as of 12/19/2023.    Past Medical History:  Diagnosis Date   AAA (abdominal aortic aneurysm) (HCC)    3.3 cm in 2012   ABDOMINAL AORTIC ANEURYSM 11/07/2009   Diameter of 3.3 cm by CT in 12/2010    Anxiety    ASCVD (arteriosclerotic cardiovascular disease)    LAD stent in 1999; RCA bare-metal stent in 12/2007, LAD stent patent, 50-60% proximal Cx lesion treated medically; 09/2012: CABG x3, LIMA to LAD, Sequential SVG to ramus intermediate and OM branch with EVH via right thigh   Coronary artery disease    Depression    Diabetes mellitus    DIABETES MELLITUS, TYPE II 08/09/2010   no insulin      ED (erectile dysfunction)    Gastric ulcer    Hepatic steatosis     Hyperlipidemia    HYPERLIPIDEMIA 08/09/2010   Lipid profile in 07/2010:144, 124, 34, 86; 01/2012:93, 82, 34, 43    Hypertension    Insomnia    MI (myocardial infarction) (HCC)    Nephrolithiasis    NEPHROLITHIASIS, RECURRENT 08/09/2010   Obstruction of the ureter on CT scan of  the abdomen resulting in right hydronephrosis in 12/2010; bladder outlet obstruction prior to that.  CT also revealed atherosclerotic changes in the abdominal arterial vessels, hepatic granuloma and calcification of the prostate.    S/P CABG x 3 10/07/2012   LIMA to LAD, Sequential SVG to ramus intermediate and OM branch with EVH via right thigh   THROMBOCYTOPENIA 08/09/2010   Platelets 88-114,000 over the past 4 years; prior evaluation by Dr. Nila Barth without a specific etiology identified.  01/2012: Normal CBC ex platelets-120    Thrombocytopenia (HCC)    Chronic; evaluated by Dr. Nila Barth in 2007   Tobacco abuse     Past Surgical History:  Procedure Laterality Date   COLONOSCOPY N/A 01/19/2021   Procedure: COLONOSCOPY;  Surgeon: Ruby Corporal, MD;  Location: [facility];  Service: Endoscopy;  Laterality: N/A;  12:30   CORONARY ANGIOPLASTY WITH STENT PLACEMENT     1999, 2009   CORONARY ARTERY BYPASS GRAFT N/A 10/07/2012   Procedure: CORONARY ARTERY BYPASS GRAFTING (CABG);  Surgeon: Gardenia Jump, MD;  Location: [facility] OR;  Service: Open Heart Surgery;  Laterality: N/A;   CYSTOSCOPY WITH LITHOLAPAXY N/A 10/29/2019   Procedure: CYSTOSCOPY WITH LITHOLAPAXY;  Surgeon: Homero Luster, MD;  Location: [facility] ORS;  Service: Urology;  Laterality: N/A;   CYSTOSCOPY WITH RETROGRADE PYELOGRAM, URETEROSCOPY AND STENT PLACEMENT Left 08/30/2015   Procedure: CYSTOSCOPY LEFT RETROGRADE PYELOGRAM  LEFT   URETEROSCOPY,  LITHOLAPAXY;  Surgeon: Homero Luster, MD;  Location: [facility];  Service: Urology;  Laterality: Left;   ESOPHAGOGASTRODUODENOSCOPY  04/04/2012   Procedure: ESOPHAGOGASTRODUODENOSCOPY (EGD);  Surgeon: Ruby Corporal, MD;  Location:  [facility];  Service: Endoscopy;  Laterality: N/A;  830   ESOPHAGOGASTRODUODENOSCOPY  07/25/2012   Procedure: ESOPHAGOGASTRODUODENOSCOPY (EGD);  Surgeon: Ruby Corporal, MD;  Location: [facility];  Service: Endoscopy;  Laterality: N/A;  830   HOLMIUM LASER APPLICATION Left 08/30/2015   Procedure: HOLMIUM LASER APPLICATION;  Surgeon: Homero Luster, MD;  Location: [facility];  Service: Urology;  Laterality: Left;   INGUINAL HERNIA REPAIR     INTRAOPERATIVE TRANSESOPHAGEAL ECHOCARDIOGRAM N/A 10/07/2012   Procedure: INTRAOPERATIVE TRANSESOPHAGEAL ECHOCARDIOGRAM;  Surgeon: Gardenia Jump, MD;  Location: [facility] OR;  Service: Open Heart Surgery;  Laterality: N/A;   LEFT HEART CATHETERIZATION WITH CORONARY ANGIOGRAM N/A 10/03/2012   Procedure: LEFT HEART CATHETERIZATION WITH CORONARY ANGIOGRAM;  Surgeon: Eilleen Grates, MD;  Location: [facility] CATH LAB;  Service: Cardiovascular;  Laterality: N/A;   LITHOTRIPSY     POLYPECTOMY  01/19/2021   Procedure: POLYPECTOMY;  Surgeon: Ruby Corporal, MD;  Location: [facility];  Service: Endoscopy;;    Social History   Socioeconomic History   Marital status: Married    Spouse name: Not on file   Number of children: Not on file   Years of education: Not on file   Highest education level: Not on file  Occupational History   Not on file  Tobacco Use   Smoking status: Every Day    Current packs/day: 0.00    Average packs/day: 0.5 packs/day for 40.0 years (20.0 ttl pk-yrs)    Types: Cigarettes    Start date: 07/09/1973    Last attempt to quit: 07/09/2013    Years since quitting: 10.4   Smokeless tobacco: Never   Tobacco comments:    2-3cigs. daily   Vaping Use   Vaping status: Never Used  Substance and Sexual Activity   Alcohol use: No    Alcohol/week: 0.0 standard drinks of alcohol  Drug use: No   Sexual activity: Not on file  Other Topics Concern   Not on file  Social History Narrative   No regular exercise   Are you right handed or left handed? Right    Are you currently employed ?    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   Who lives with you? family   What type of home do you live in: 1 story or 2 story? one    Caffiene  2 cups a day   Social Drivers of Corporate investment banker Strain: Not on file  Food Insecurity: Not on file  Transportation Needs: Not on file  Physical Activity: Not on file  Stress: Not on file  Social Connections: Not on file  Intimate Partner Violence: Not on file    Family History  Problem Relation Age of Onset   Diabetes Mother    Ovarian cancer Sister        Objective: Vitals:   12/19/23 1518  BP: 123/62  Pulse: 79      Physical Exam Vitals reviewed.  Constitutional:      Appearance: Normal appearance.   Neurological:     Mental Status: He is alert.     Lab Results:  Results for orders placed or performed in visit on 12/19/23 (from the past 24 hours)  Urinalysis, Routine w reflex microscopic     Status: Abnormal   Collection Time: 12/19/23  3:20 PM  Result Value Ref Range   Specific Gravity, UA <1.005 (L) 1.005 - 1.030   pH, UA 6.0 5.0 - 7.5   Color, UA Yellow Yellow   Appearance Ur Clear Clear   Leukocytes,UA Negative Negative   Protein,UA Negative Negative/Trace   Glucose, UA Negative Negative   Ketones, UA Negative Negative   RBC, UA Negative Negative   Bilirubin, UA Negative Negative   Urobilinogen, Ur 0.2 0.2 - 1.0 mg/dL   Nitrite, UA Negative Negative   Microscopic Examination Comment    Narrative   Performed at:  [address] - [facility] [address], [place], [place]  [id number] Lab Director: Liliana Regulus MT, Phone:  [phone]    Recent Results (from the past 2160 hours)  POC SARS Coronavirus 2 Ag-ED - Nasal Swab     Status: None   Collection Time: 12/12/23  8:27 AM  Result Value Ref Range   SARS Coronavirus 2 Ag Negative Negative  POCT urinalysis dipstick     Status: Abnormal   Collection Time: 12/12/23   9:05 AM  Result Value Ref Range   Color, UA straw (A) yellow   Clarity, UA clear clear   Glucose, UA negative negative mg/dL   Bilirubin, UA small (A) negative   Ketones, POC UA negative negative mg/dL   Spec Grav, UA >=8.295 (A) 1.010 - 1.025   Blood, UA trace-intact (A) negative   pH, UA 5.5 5.0 - 8.0   Protein Ur, POC =100 (A) negative mg/dL   Urobilinogen, UA 2.0 (A) 0.2 or 1.0 E.U./dL   Nitrite, UA Negative Negative   Leukocytes, UA Negative Negative  Urine Culture     Status: None   Collection Time: 12/12/23  9:13 AM   Specimen: Urine, Clean Catch  Result Value Ref Range   Specimen Description      URINE, CLEAN CATCH Performed at [facility], [address] [place], [place] [address]    Special Requests      NONE  Performed at [facility], [address], [place], [place] [address]    Culture      NO GROWTH Performed at [facility], [address], [place], [place] [address]    Report Status 12/13/2023 FINAL   Basic metabolic panel     Status: Abnormal   Collection Time: 12/12/23  9:19 AM  Result Value Ref Range   Glucose 147 (H) 70 - 99 mg/dL   BUN 26 8 - 27 mg/dL   Creatinine, Ser 1.91 (H) 0.76 - 1.27 mg/dL   eGFR 53 (L) >47 WG/NFA/2.13   BUN/Creatinine Ratio 18 10 - 24   Sodium 137 134 - 144 mmol/L   Potassium 4.4 3.5 - 5.2 mmol/L   Chloride 102 96 - 106 mmol/L   CO2 17 (L) 20 - 29 mmol/L   Calcium  9.3 8.6 - 10.2 mg/dL  CBC with Differential/Platelet     Status: Abnormal   Collection Time: 12/12/23  9:19 AM  Result Value Ref Range   WBC 4.8 3.4 - 10.8 x10E3/uL   RBC 4.83 4.14 - 5.80 x10E6/uL   Hemoglobin 13.8 13.0 - 17.7 g/dL   Hematocrit 08.6 57.8 - 51.0 %   MCV 90 79 - 97 fL   MCH 28.6 26.6 - 33.0 pg   MCHC 31.8 31.5 - 35.7 g/dL   RDW 46.9 62.9 - 52.8 %   Platelets 81 (LL) 150 - 450 x10E3/uL    Comment: Actual platelet count may be somewhat higher than reported due to aggregation of platelets in this sample.    Neutrophils 72 Not Estab. %    Lymphs 15 Not Estab. %   Monocytes 11 Not Estab. %   Eos 1 Not Estab. %   Basos 1 Not Estab. %   Neutrophils Absolute 3.4 1.4 - 7.0 x10E3/uL   Lymphocytes Absolute 0.7 0.7 - 3.1 x10E3/uL   Monocytes Absolute 0.5 0.1 - 0.9 x10E3/uL   EOS (ABSOLUTE) 0.0 0.0 - 0.4 x10E3/uL   Basophils Absolute 0.0 0.0 - 0.2 x10E3/uL   Immature Granulocytes 0 Not Estab. %   Immature Grans (Abs) 0.0 0.0 - 0.1 x10E3/uL   Hematology Comments: Note:     Comment: CBC met reflex criteria for review of peripheral smear by medical laboratory professional. Automated results were confirmed by smear review.   Urinalysis, Routine w reflex microscopic     Status: Abnormal   Collection Time: 12/19/23  3:20 PM  Result Value Ref Range   Specific Gravity, UA <1.005 (L) 1.005 - 1.030   pH, UA 6.0 5.0 - 7.5   Color, UA Yellow Yellow   Appearance Ur Clear Clear   Leukocytes,UA Negative Negative   Protein,UA Negative Negative/Trace   Glucose, UA Negative Negative   Ketones, UA Negative Negative   RBC, UA Negative Negative   Bilirubin, UA Negative Negative   Urobilinogen, Ur 0.2 0.2 - 1.0 mg/dL   Nitrite, UA Negative Negative   Microscopic Examination Comment     Comment: Microscopic not indicated and not performed.        BMET No results for input(s): NA, K, CL, CO2, GLUCOSE, BUN, CREATININE, CALCIUM  in the last 72 hours. PSA No results found for: PSA No results found for: TESTOSTERONE Lab Results  Component Value Date   PSA1 0.3 08/01/2023   PSA1 0.6 01/25/2022     Studies/Results:    Assessment & Plan:  Nephrolithiasis.  He has minimal change in the LLP stone. F/u in a year with a KUB.  BPH with BOO.  He will stay on tamsulosin  and finasteride  which I refilled.  PSA is suppressed on finasteride .  Repeat PSA in a year.  ED.  He is doing well on tadalafil .      Meds ordered this encounter  Medications   finasteride  (PROSCAR ) 5 MG tablet    Sig: Take 1 tablet (5 mg  total) by mouth daily.    Dispense:  100 tablet    Refill:  3   tamsulosin  (FLOMAX ) 0.4 MG CAPS capsule    Sig: Take 1 capsule (0.4 mg total) by mouth daily.    Dispense:  100 capsule    Refill:  3   tadalafil  (CIALIS ) 5 MG tablet    Sig: Take 1 tablet (5 mg total) by mouth daily as needed for erectile dysfunction.    Dispense:  10 tablet    Refill:  11      Orders Placed This Encounter  Procedures   DG Abd 1 View    Standing Status:   Future    Expected Date:   12/11/2024    Expiration Date:   12/18/2024    Reason for Exam (SYMPTOM  OR DIAGNOSIS REQUIRED):   renal stone    Preferred imaging location?:   [facility]    Radiology Contrast Protocol - do NOT remove file path:   [email or web address]   Urinalysis, Routine w reflex microscopic   PSA, total and free    Standing Status:   Future    Expected Date:   12/11/2024    Expiration Date:   12/18/2024       Return in about 1 year (around 12/18/2024) for with any available provider. .   CC: Minus Amel, MD      Homero Luster 12/20/2023 Patient ID: Roselee Cong, male   DOB: 1950/08/23, 73 y.o.   MRN: [id number]

## 2023-12-20 ENCOUNTER — Telehealth: Payer: Self-pay

## 2023-12-20 LAB — URINALYSIS, ROUTINE W REFLEX MICROSCOPIC
Bilirubin, UA: NEGATIVE
Glucose, UA: NEGATIVE
Ketones, UA: NEGATIVE
Leukocytes,UA: NEGATIVE
Nitrite, UA: NEGATIVE
Protein,UA: NEGATIVE
RBC, UA: NEGATIVE
Specific Gravity, UA: <1.005 — ABNORMAL LOW (ref 1.005–1.030)
Urobilinogen, Ur: 0.2 mg/dL (ref 0.2–1.0)
pH, UA: 6 (ref 5.0–7.5)

## 2023-12-20 NOTE — Telephone Encounter (Signed)
 Medication prior authorization request received.  Completed PA request through cover my meds for drug Tadalafil  5 mg. KEY: [id number]  Approved: Pending

## 2023-12-23 ENCOUNTER — Telehealth: Payer: Self-pay

## 2023-12-23 NOTE — Telephone Encounter (Signed)
 Called to appeal denial for Rx of Tadalafil  after reviewing pt PA the representative stated appeal was approved case number: [id number]

## 2023-12-25 DIAGNOSIS — Z6823 Body mass index (BMI) 23.0-23.9, adult: Secondary | ICD-10-CM | POA: Diagnosis not present

## 2023-12-25 DIAGNOSIS — D6942 Congenital and hereditary thrombocytopenia purpura: Secondary | ICD-10-CM | POA: Diagnosis not present

## 2023-12-30 ENCOUNTER — Ambulatory Visit: Payer: Self-pay

## 2024-02-11 DIAGNOSIS — E1151 Type 2 diabetes mellitus with diabetic peripheral angiopathy without gangrene: Secondary | ICD-10-CM | POA: Diagnosis not present

## 2024-02-11 DIAGNOSIS — I2510 Atherosclerotic heart disease of native coronary artery without angina pectoris: Secondary | ICD-10-CM | POA: Diagnosis not present

## 2024-02-11 DIAGNOSIS — D6942 Congenital and hereditary thrombocytopenia purpura: Secondary | ICD-10-CM | POA: Diagnosis not present

## 2024-02-11 DIAGNOSIS — E7849 Other hyperlipidemia: Secondary | ICD-10-CM | POA: Diagnosis not present

## 2024-02-11 DIAGNOSIS — Z0001 Encounter for general adult medical examination with abnormal findings: Secondary | ICD-10-CM | POA: Diagnosis not present

## 2024-03-18 ENCOUNTER — Other Ambulatory Visit: Payer: Self-pay | Admitting: Cardiology

## 2024-04-01 ENCOUNTER — Other Ambulatory Visit: Payer: Self-pay | Admitting: Neurology

## 2024-04-01 DIAGNOSIS — B0229 Other postherpetic nervous system involvement: Secondary | ICD-10-CM

## 2024-04-01 DIAGNOSIS — R519 Headache, unspecified: Secondary | ICD-10-CM

## 2024-05-07 ENCOUNTER — Ambulatory Visit: Admitting: Cardiology

## 2024-05-21 DIAGNOSIS — L570 Actinic keratosis: Secondary | ICD-10-CM | POA: Diagnosis not present

## 2024-05-21 DIAGNOSIS — L821 Other seborrheic keratosis: Secondary | ICD-10-CM | POA: Diagnosis not present

## 2024-05-21 DIAGNOSIS — X32XXXA Exposure to sunlight, initial encounter: Secondary | ICD-10-CM | POA: Diagnosis not present

## 2024-05-26 ENCOUNTER — Encounter: Payer: Self-pay | Admitting: Cardiology

## 2024-05-26 ENCOUNTER — Ambulatory Visit: Attending: Cardiology | Admitting: Cardiology

## 2024-05-26 VITALS — BP 122/76 | HR 68 | Ht 71.0 in | Wt 164.0 lb

## 2024-05-26 DIAGNOSIS — E782 Mixed hyperlipidemia: Secondary | ICD-10-CM

## 2024-05-26 DIAGNOSIS — I714 Abdominal aortic aneurysm, without rupture, unspecified: Secondary | ICD-10-CM

## 2024-05-26 DIAGNOSIS — I2510 Atherosclerotic heart disease of native coronary artery without angina pectoris: Secondary | ICD-10-CM

## 2024-05-26 DIAGNOSIS — I493 Ventricular premature depolarization: Secondary | ICD-10-CM | POA: Diagnosis not present

## 2024-05-26 NOTE — Progress Notes (Signed)
 Clinical Summary [name] is a 73 y.o.male seen today for follow up of the following medical problems    1. CAD   - prior 3 vessel CABG in 09/2012 (LIMA to LAD, SVG to ramus, SVG to OM), history of PCI prior to that. 09/2012 LVEF 60-65% by echo     -no chest pain, no SOB/DOE - compliant with meds     2. HTN    - compliant with meds   3. HLD - 01/2023 TC 96 TG 97 HDL 37 LDL 40 - 01/2024 TC 882 TG 879 HDL 37 LDL 58   4. AAA   - US  09/2013 3.5 x 4.5, slightly larger than prior study a year prior (3.3x3.9) - for repeat study in 2019  06/2019 4.3 cm aneurusm 06/2021 CT renal stone: 4.4 x 4.4 cm previously 4.3 x 3.9 cm.   10/2022 AAA US : 4.4 x 5 cm, overall stable -10/2023 4.8 cm   5. PVCs - some palpitations at times, overall tolerable. - 2-3 cups of coffee.    6. Recent severe bout with facial/ocular shingles  - some residual symptoms      SH:  Retired emergency planning/management officer.  On disabilty due to cardaic condition and chronic joint pains.   Past Medical History:  Diagnosis Date   AAA (abdominal aortic aneurysm)    3.3 cm in 2012   ABDOMINAL AORTIC ANEURYSM 11/07/2009   Diameter of 3.3 cm by CT in 12/2010    Anxiety    ASCVD (arteriosclerotic cardiovascular disease)    LAD stent in 1999; RCA bare-metal stent in 12/2007, LAD stent patent, 50-60% proximal Cx lesion treated medically; 09/2012: CABG x3, LIMA to LAD, Sequential SVG to ramus intermediate and OM [name] with EVH via right thigh   Coronary artery disease    Depression    Diabetes mellitus    DIABETES MELLITUS, TYPE II 08/09/2010   no insulin      ED (erectile dysfunction)    Gastric ulcer    Hepatic steatosis    Hyperlipidemia    HYPERLIPIDEMIA 08/09/2010   Lipid profile in 07/2010:144, 124, 34, 86; 01/2012:93, 82, 34, 43    Hypertension    Insomnia    MI (myocardial infarction) (HCC)    Nephrolithiasis    NEPHROLITHIASIS, RECURRENT 08/09/2010   Obstruction of the ureter on CT scan of the abdomen resulting in  right hydronephrosis in 12/2010; bladder outlet obstruction prior to that.  CT also revealed atherosclerotic changes in the abdominal arterial vessels, hepatic granuloma and calcification of the prostate.    S/P CABG x 3 10/07/2012   LIMA to LAD, Sequential SVG to ramus intermediate and OM [name] with EVH via right thigh   Thrombocytopenia    Chronic; evaluated by Dr. Pernell in 2007   THROMBOCYTOPENIA 08/09/2010   Platelets 88-114,000 over the past 4 years; prior evaluation by Dr. Pernell without a specific etiology identified.  01/2012: Normal CBC ex platelets-120    Tobacco abuse      Allergies  Allergen Reactions   Meperidine  Hcl Other (See Comments)    Was mixed with phenergan and turned blood vessels red.    Promethazine Hcl Other (See Comments)    Was mixed with the Demerol  and caused veins to turn red.      Current Outpatient Medications  Medication Sig Dispense Refill   acetaminophen  (TYLENOL ) 500 MG tablet Take 500 mg by mouth at bedtime.     amLODipine  (NORVASC ) 10 MG tablet TAKE 1 TABLET  BY MOUTH EVERY DAY 90 tablet 2   aspirin  EC 81 MG tablet Take 1 tablet (81 mg total) by mouth daily. 30 tablet 11   atorvastatin  (LIPITOR ) 40 MG tablet TAKE 1 TABLET BY MOUTH EVERY DAY 90 tablet 1   cetirizine (ZYRTEC) 10 MG tablet Take 10 mg by mouth daily. Or zyzal     finasteride  (PROSCAR ) 5 MG tablet Take 1 tablet (5 mg total) by mouth daily. 100 tablet 3   fluticasone  (FLONASE ) 50 MCG/ACT nasal spray Place 2 sprays into both nostrils daily. 16 g 0   gabapentin  (NEURONTIN ) 300 MG capsule TAKE 1 CAP IN THE AM, TAKE 1 CAP MIDDAY, TAKE 1 CAP IN THE AFTERNOON, AND TAKE 2 CAPS IN THE EVENING 150 capsule 5   hydrOXYzine  (ATARAX ) 10 MG tablet Take 1 tablet (10 mg total) by mouth at bedtime as needed. 10 tablet 0   levocetirizine (XYZAL ) 5 MG tablet Take 1 tablet (5 mg total) by mouth every evening. 90 tablet 0   metFORMIN (GLUCOPHAGE) 1000 MG tablet Take 1,000 mg by mouth 2 (two) times  daily.     metoprolol  tartrate (LOPRESSOR ) 25 MG tablet Take 1 tablet (25 mg total) by mouth 2 (two) times daily. 180 tablet 0   ONETOUCH ULTRA test strip USE TO TEST SUGAR THREE TIMES DAILY     ramipril  (ALTACE ) 10 MG capsule TAKE 2 CAPSULES BY MOUTH EVERY DAY WITH BREAKFAST 180 capsule 3   tadalafil  (CIALIS ) 5 MG tablet Take 1 tablet (5 mg total) by mouth daily as needed for erectile dysfunction. 10 tablet 11   tamsulosin  (FLOMAX ) 0.4 MG CAPS capsule Take 1 capsule (0.4 mg total) by mouth daily. 100 capsule 3   No current facility-administered medications for this visit.     Past Surgical History:  Procedure Laterality Date   COLONOSCOPY N/A 01/19/2021   Procedure: COLONOSCOPY;  Surgeon: Golda Claudis PENNER, MD;  [facility]: [facility];  Service: Endoscopy;  Laterality: N/A;  12:30   CORONARY ANGIOPLASTY WITH STENT PLACEMENT     1999, 2009   CORONARY ARTERY BYPASS GRAFT N/A 10/07/2012   Procedure: CORONARY ARTERY BYPASS GRAFTING (CABG);  Surgeon: Sudie VEAR Laine, MD;  Location: [facility] OR;  Service: Open Heart Surgery;  Laterality: N/A;   CYSTOSCOPY WITH LITHOLAPAXY N/A 10/29/2019   Procedure: CYSTOSCOPY WITH LITHOLAPAXY;  Surgeon: Watt Rush, MD;  Location: [facility];  Service: Urology;  Laterality: N/A;   CYSTOSCOPY WITH RETROGRADE PYELOGRAM, URETEROSCOPY AND STENT PLACEMENT Left 08/30/2015   Procedure: CYSTOSCOPY LEFT RETROGRADE PYELOGRAM  LEFT   URETEROSCOPY,  LITHOLAPAXY;  Surgeon: Rush Watt, MD;  Location: [facility];  Service: Urology;  Laterality: Left;   ESOPHAGOGASTRODUODENOSCOPY  04/04/2012   Procedure: ESOPHAGOGASTRODUODENOSCOPY (EGD);  Surgeon: Claudis PENNER Golda, MD;  [facility]: [facility];  Service: Endoscopy;  Laterality: N/A;  830   ESOPHAGOGASTRODUODENOSCOPY  07/25/2012   Procedure: ESOPHAGOGASTRODUODENOSCOPY (EGD);  Surgeon: Claudis PENNER Golda, MD;  [facility]: [facility];  Service: Endoscopy;  Laterality: N/A;  830   HOLMIUM LASER APPLICATION Left 08/30/2015   Procedure: HOLMIUM LASER  APPLICATION;  Surgeon: Rush Watt, MD;  Location: [facility];  Service: Urology;  Laterality: Left;   INGUINAL HERNIA REPAIR     INTRAOPERATIVE TRANSESOPHAGEAL ECHOCARDIOGRAM N/A 10/07/2012   Procedure: INTRAOPERATIVE TRANSESOPHAGEAL ECHOCARDIOGRAM;  Surgeon: Sudie VEAR Laine, MD;  Location: [facility] OR;  Service: Open Heart Surgery;  Laterality: N/A;   LEFT HEART CATHETERIZATION WITH CORONARY ANGIOGRAM N/A 10/03/2012   Procedure: LEFT HEART CATHETERIZATION WITH CORONARY ANGIOGRAM;  Surgeon: Lynwood Schilling, MD;  Location: [facility] CATH LAB;  Service: Cardiovascular;  Laterality: N/A;   LITHOTRIPSY     POLYPECTOMY  01/19/2021   Procedure: POLYPECTOMY;  Surgeon: Golda Claudis PENNER, MD;  [facility];  Service: Endoscopy;;     Allergies  Allergen Reactions   Meperidine  Hcl Other (See Comments)    Was mixed with phenergan and turned blood vessels red.    Promethazine Hcl Other (See Comments)    Was mixed with the Demerol  and caused veins to turn red.       Family History  Problem Relation Age of Onset   Diabetes Mother    Ovarian cancer Sister      Social History Mr. Fullam reports that he has been smoking cigarettes. He started smoking about 50 years ago. He has a 20 pack-year smoking history. He has never used smokeless tobacco. Mr. Tschantz reports no history of alcohol use.     Physical Examination Today's Vitals   05/26/24 0838  BP: 122/76  Pulse: 68  SpO2: 98%  Weight: 164 lb (74.4 kg)  Height: 5' 11 (1.803 m)   Body mass index is 22.87 kg/m.  Gen: resting comfortably, no acute distress HEENT: no scleral icterus, pupils equal round and reactive, no palptable cervical adenopathy,  CV: RRR, no mrg, no jvd Resp: Clear to auscultation bilaterally GI: abdomen is soft, non-tender, non-distended, normal bowel sounds, no hepatosplenomegaly MSK: extremities are warm, no edema.  Skin: warm, no rash Neuro:  no focal deficits Psych: appropriate affect   Diagnostic Studies  09/2012  Echo: LVEF 55-60%, no WMAs,    09/2012 AAA US : 3.3 x 3.9 cm, stable from prior CT   08/2013 AAA US  Infrarenal abdominal aortic aneurysm measuring 3.5 x 4.5 cm.   Previous examination of 10/06/2012 demonstrated the abdominal aorta   measuring 3.3 x 3.9 cm.   08/2014 AAA US  IMPRESSION: 4.4 cm infrarenal abdominal aortic aneurysm. Recommend followup by US  in 3 years. This recommendation follows ACR consensus guidelines: White Paper of the ACR Incidental Findings Committee II on Vascular Findings. J Am Coll Radiol 2013; 89:210-205   Assessment and Plan   1. CAD   -no symptoms, continue current meds   2. HTN   - at goal, continue current meds   3. Hyperlipidemia -at goal, continue current therapy   4. AAA - moderate and stable, we will repeat study after our next visit in 6 months  5. PVCs - mild symptoms at times, overall tolerable and not to the extent he favors increasing the metoprolol   [name], M.D.

## 2024-05-26 NOTE — Patient Instructions (Signed)
 Medication Instructions:  Continue all current medications.   Labwork: none  Testing/Procedures: none  Follow-Up: 6 months   Any Other Special Instructions Will Be Listed Below (If Applicable).   If you need a refill on your cardiac medications before your next appointment, please call your pharmacy.

## 2024-07-09 ENCOUNTER — Ambulatory Visit
Admission: RE | Admit: 2024-07-09 | Discharge: 2024-07-09 | Disposition: A | Discharge status: Home / Self Care | Source: Ambulatory Visit | Attending: Family Medicine | Admitting: Family Medicine

## 2024-07-09 VITALS — BP 122/63 | HR 78 | Temp 98.1°F | Resp 16

## 2024-07-09 DIAGNOSIS — Z711 Person with feared health complaint in whom no diagnosis is made: Secondary | ICD-10-CM

## 2024-07-09 NOTE — ED Triage Notes (Signed)
 Pt reports rash on left arm and shoulder started x 5 days

## 2024-07-09 NOTE — ED Provider Notes (Signed)
 " [facility]    CSN: [id number] Arrival date & time: 07/09/24  1656      History   Chief Complaint No chief complaint on file.   HPI [name] is a 74 y.o. male.   The history is provided by the patient.   Patient presents for concerns of shingles to the left arm and left shoulder.  He states that he has intermittent discomfort to the left shoulder.  He denies apparent rash.  He reports that he is concerned because he has a history of shingles to the right side of his face and he is continue to deal with nerve pain.  He denies erythema, blistering, pain to the site, oozing, or drainage.  So far, he is not use any medications for symptoms.  Past Medical History:  Diagnosis Date   AAA (abdominal aortic aneurysm)    3.3 cm in 2012   ABDOMINAL AORTIC ANEURYSM 11/07/2009   Diameter of 3.3 cm by CT in 12/2010    Anxiety    ASCVD (arteriosclerotic cardiovascular disease)    LAD stent in 1999; RCA bare-metal stent in 12/2007, LAD stent patent, 50-60% proximal Cx lesion treated medically; 09/2012: CABG x3, LIMA to LAD, Sequential SVG to ramus intermediate and OM branch with EVH via right thigh   Coronary artery disease    Depression    Diabetes mellitus    DIABETES MELLITUS, TYPE II 08/09/2010   no insulin      ED (erectile dysfunction)    Gastric ulcer    Hepatic steatosis    Hyperlipidemia    HYPERLIPIDEMIA 08/09/2010   Lipid profile in 07/2010:144, 124, 34, 86; 01/2012:93, 82, 34, 43    Hypertension    Insomnia    MI (myocardial infarction) (HCC)    Nephrolithiasis    NEPHROLITHIASIS, RECURRENT 08/09/2010   Obstruction of the ureter on CT scan of the abdomen resulting in right hydronephrosis in 12/2010; bladder outlet obstruction prior to that.  CT also revealed atherosclerotic changes in the abdominal arterial vessels, hepatic granuloma and calcification of the prostate.    S/P CABG x 3 10/07/2012   LIMA to LAD, Sequential SVG to ramus intermediate and OM branch  with EVH via right thigh   Thrombocytopenia    Chronic; evaluated by Dr. Pernell in 2007   THROMBOCYTOPENIA 08/09/2010   Platelets 88-114,000 over the past 4 years; prior evaluation by Dr. Pernell without a specific etiology identified.  01/2012: Normal CBC ex platelets-120    Tobacco abuse     Patient Active Problem List   Diagnosis Date Noted   Left upper quadrant abdominal pain 06/15/2021   Gross hematuria 06/15/2021   BPH with urinary obstruction 06/15/2021   Weakness    CAP (community acquired pneumonia) 03/15/2021   Ureteral stone 07/09/2013   Hypertension 10/02/2012   Type 2 diabetes mellitus with hyperlipidemia (HCC) 08/09/2010   HYPERLIPIDEMIA 08/09/2010   THROMBOCYTOPENIA 08/09/2010   Arteriosclerotic cardiovascular disease (ASCVD) 08/09/2010   Renal stones 08/09/2010   ERECTILE DYSFUNCTION, ORGANIC 08/09/2010   INSOMNIA 08/09/2010   Abdominal aortic aneurysm 11/07/2009    Past Surgical History:  Procedure Laterality Date   COLONOSCOPY N/A 01/19/2021   Procedure: COLONOSCOPY;  Surgeon: Golda Claudis PENNER, MD;  [facility]: [facility];  Service: Endoscopy;  Laterality: N/A;  12:30   CORONARY ANGIOPLASTY WITH STENT PLACEMENT     1999, 2009   CORONARY ARTERY BYPASS GRAFT N/A 10/07/2012   Procedure: CORONARY ARTERY BYPASS GRAFTING (CABG);  Surgeon: Sudie VEAR Laine,  MD;  Location: [facility] OR;  Service: Open Heart Surgery;  Laterality: N/A;   CYSTOSCOPY WITH LITHOLAPAXY N/A 10/29/2019   Procedure: CYSTOSCOPY WITH LITHOLAPAXY;  Surgeon: Watt Rush, MD;  Location: [facility];  Service: Urology;  Laterality: N/A;   CYSTOSCOPY WITH RETROGRADE PYELOGRAM, URETEROSCOPY AND STENT PLACEMENT Left 08/30/2015   Procedure: CYSTOSCOPY LEFT RETROGRADE PYELOGRAM  LEFT   URETEROSCOPY,  LITHOLAPAXY;  Surgeon: Rush Watt, MD;  Location: [facility];  Service: Urology;  Laterality: Left;   ESOPHAGOGASTRODUODENOSCOPY  04/04/2012   Procedure: ESOPHAGOGASTRODUODENOSCOPY (EGD);  Surgeon: Claudis RAYMOND Rivet, MD;   [facility];  Service: Endoscopy;  Laterality: N/A;  830   ESOPHAGOGASTRODUODENOSCOPY  07/25/2012   Procedure: ESOPHAGOGASTRODUODENOSCOPY (EGD);  Surgeon: Claudis RAYMOND Rivet, MD;  [facility]: [facility];  Service: Endoscopy;  Laterality: N/A;  830   HOLMIUM LASER APPLICATION Left 08/30/2015   Procedure: HOLMIUM LASER APPLICATION;  Surgeon: Rush Watt, MD;  Location: [facility];  Service: Urology;  Laterality: Left;   INGUINAL HERNIA REPAIR     INTRAOPERATIVE TRANSESOPHAGEAL ECHOCARDIOGRAM N/A 10/07/2012   Procedure: INTRAOPERATIVE TRANSESOPHAGEAL ECHOCARDIOGRAM;  Surgeon: Sudie VEAR Laine, MD;  Location: [facility] OR;  Service: Open Heart Surgery;  Laterality: N/A;   LEFT HEART CATHETERIZATION WITH CORONARY ANGIOGRAM N/A 10/03/2012   Procedure: LEFT HEART CATHETERIZATION WITH CORONARY ANGIOGRAM;  Surgeon: Lynwood Schilling, MD;  Location: [facility] CATH LAB;  Service: Cardiovascular;  Laterality: N/A;   LITHOTRIPSY     POLYPECTOMY  01/19/2021   Procedure: POLYPECTOMY;  Surgeon: Rivet Claudis RAYMOND, MD;  Location: [facility];  Service: Endoscopy;;       Home Medications    Prior to Admission medications  Medication Sig Start Date End Date Taking? Authorizing Provider  acetaminophen  (TYLENOL ) 500 MG tablet Take 500 mg by mouth at bedtime.    [provider]  amLODipine  (NORVASC ) 10 MG tablet TAKE 1 TABLET BY MOUTH EVERY DAY 03/18/24   Alvan Dorn FALCON, MD  aspirin  EC 81 MG tablet Take 1 tablet (81 mg total) by mouth daily. 01/20/21   Rivet Claudis RAYMOND, MD  atorvastatin  (LIPITOR ) 40 MG tablet TAKE 1 TABLET BY MOUTH EVERY DAY 02/26/22   Alvan Dorn FALCON, MD  cetirizine (ZYRTEC) 10 MG tablet Take 10 mg by mouth daily. Or zyzal    [provider]  finasteride  (PROSCAR ) 5 MG tablet Take 1 tablet (5 mg total) by mouth daily. 12/19/23   Watt Rush, MD  fluticasone  (FLONASE ) 50 MCG/ACT nasal spray Place 2 sprays into both nostrils daily. 12/12/23   Leath-Warren, Etta PARAS, NP  gabapentin   (NEURONTIN ) 300 MG capsule TAKE 1 CAP IN THE AM, TAKE 1 CAP MIDDAY, TAKE 1 CAP IN THE AFTERNOON, AND TAKE 2 CAPS IN THE EVENING 04/02/24   Leigh Venetia CROME, MD  hydrOXYzine  (ATARAX ) 10 MG tablet Take 1 tablet (10 mg total) by mouth at bedtime as needed. 06/19/23   Stuart Vernell Norris, PA-C  levocetirizine (XYZAL ) 5 MG tablet Take 1 tablet (5 mg total) by mouth every evening. 08/20/21   Christopher Savannah, PA-C  metFORMIN (GLUCOPHAGE) 1000 MG tablet Take 1,000 mg by mouth 2 (two) times daily.    [provider]  metoprolol  tartrate (LOPRESSOR ) 25 MG tablet Take 1 tablet (25 mg total) by mouth 2 (two) times daily. 10/09/22   Alvan Dorn FALCON, MD  ONETOUCH ULTRA test strip USE TO TEST SUGAR THREE TIMES DAILY 12/21/20   [provider]  ramipril  (ALTACE ) 10 MG capsule TAKE 2 CAPSULES BY MOUTH EVERY DAY  WITH BREAKFAST 11/18/23   Alvan Dorn FALCON, MD  tadalafil  (CIALIS ) 5 MG tablet Take 1 tablet (5 mg total) by mouth daily as needed for erectile dysfunction. 12/19/23   Watt Rush, MD  tamsulosin  (FLOMAX ) 0.4 MG CAPS capsule Take 1 capsule (0.4 mg total) by mouth daily. 12/19/23   Watt Rush, MD    Family History Family History  Problem Relation Age of Onset   Diabetes Mother    Ovarian cancer Sister     Social History Social History[1]   Allergies   Meperidine  hcl and Promethazine hcl   Review of Systems Review of Systems Per HPI  Physical Exam Triage Vital Signs ED Triage Vitals  Encounter Vitals Group     BP 07/09/24 1707 122/63     Girls Systolic BP Percentile --      Girls Diastolic BP Percentile --      Boys Systolic BP Percentile --      Boys Diastolic BP Percentile --      Pulse Rate 07/09/24 1707 78     Resp 07/09/24 1706 16     Temp 07/09/24 1707 98.1 F (36.7 C)     Temp Source 07/09/24 1706 Oral     SpO2 07/09/24 1707 96 %     Weight --      Height --      Head Circumference --      Peak Flow --      Pain Score 07/09/24 1708 5     Pain Loc --       Pain Education --      Exclude from Growth Chart --    No data found.  Updated Vital Signs BP 122/63 (BP Location: Right Arm)   Pulse 78   Temp 98.1 F (36.7 C) (Oral)   Resp 16   SpO2 96%   Visual Acuity Right Eye Distance:   Left Eye Distance:   Bilateral Distance:    Right Eye Near:   Left Eye Near:    Bilateral Near:     Physical Exam Vitals and nursing note reviewed.  Constitutional:      General: He is not in acute distress.    Appearance: Normal appearance.  HENT:     Head: Normocephalic.  Eyes:     Extraocular Movements: Extraocular movements intact.     Pupils: Pupils are equal, round, and reactive to light.  Cardiovascular:     Rate and Rhythm: Normal rate and regular rhythm.     Pulses: Normal pulses.     Heart sounds: Normal heart sounds.  Pulmonary:     Effort: Pulmonary effort is normal.     Breath sounds: Normal breath sounds.  Musculoskeletal:     Cervical back: Normal range of motion.  Skin:    General: Skin is warm and dry.     Findings: No erythema, lesion or rash.  Neurological:     General: No focal deficit present.     Mental Status: He is alert and oriented to person, place, and time.  Psychiatric:        Mood and Affect: Mood normal.        Behavior: Behavior normal.      UC Treatments / Results  Labs (all labs ordered are listed, but only abnormal results are displayed) Labs Reviewed - No data to display  EKG   Radiology No results found.  Procedures Procedures (including critical care time)  Medications Ordered in UC Medications - No data to display  Initial Impression / Assessment and Plan / UC Course  I have reviewed the triage vital signs and the nursing notes.  Pertinent labs & imaging results that were available during my care of the patient were reviewed by me and considered in my medical decision making (see chart for details).  Patient with no apparent rash to the left shoulder or left arm.  Symptoms are not  consistent with shingles.  He does have some dry skin noted to the left upper extremity.  Supportive care recommendations were provided and discussed with the patient to include use of Aquaphor or Eucerin to the affected area.  Patient advised to follow-up if symptoms develop.  Patient was in agreement with this plan of care and verbalizes understanding.  All questions were answered.  Patient stable for discharge.  Final Clinical Impressions(s) / UC Diagnoses   Final diagnoses:  None   Discharge Instructions   None    ED Prescriptions   None    PDMP not reviewed this encounter.    [1]  Social History Tobacco Use   Smoking status: Every Day    Current packs/day: 0.00    Average packs/day: 0.5 packs/day for 40.0 years (20.0 ttl pk-yrs)    Types: Cigarettes    Start date: 07/09/1973    Last attempt to quit: 07/09/2013    Years since quitting: 11.0   Smokeless tobacco: Never   Tobacco comments:    2-3cigs. daily   Vaping Use   Vaping status: Never Used  Substance Use Topics   Alcohol use: No    Alcohol/week: 0.0 standard drinks of alcohol   Drug use: No     Gilmer Etta PARAS, NP 07/09/24 1723  "

## 2024-07-09 NOTE — Discharge Instructions (Signed)
 You do not have shingles on your left arm or left shoulder. Recommend using a moisturizer such as Eucerin or Aquaphor to the affected area daily while symptoms persist.   If you develop a rash, itching, or other concerns, you may follow-up in this clinic or with your primary care physician for further evaluation. Follow-up as needed.

## 2024-07-16 ENCOUNTER — Encounter: Payer: Self-pay | Admitting: Orthopedic Surgery

## 2024-07-16 ENCOUNTER — Telehealth: Payer: Self-pay | Admitting: Orthopedic Surgery

## 2024-07-16 ENCOUNTER — Ambulatory Visit: Admitting: Orthopedic Surgery

## 2024-07-16 VITALS — BP 122/63 | Ht 71.0 in | Wt 164.0 lb

## 2024-07-16 DIAGNOSIS — M65312 Trigger thumb, left thumb: Secondary | ICD-10-CM | POA: Diagnosis not present

## 2024-07-16 NOTE — Telephone Encounter (Signed)
 Dr. Areatha pt - pt's spouse lvm stating the pt was just up and got an injection in his finger.  They want to know why his finger is numb.  [phone]

## 2024-07-16 NOTE — Progress Notes (Signed)
" ° °  Patient: [name]           Date of Birth: [birth year]           MRN: [id number] Visit Date: 07/16/2024 Requested by: Marvine Rush, MD [address] [place],  [place] [address] PCP: Marvine Rush, MD  Encounter Diagnosis  Name Primary?   Trigger thumb, left thumb Yes    Assessment and plan:  Surgical versus nonoperative treatment reviewed with the patient  Opted for injection   Left Trigger thumb injection Medication  1 mL of 40 mg Depo-Medrol   2 mL of 1% lidocaine  plain  Ethyl chloride for anesthesia  Verbal consent was obtained timeout was taken to confirm the injection site as left thumb  Alcohol was used to prepare the skin along with ethyl chloride and then the injection was made at the A1 pulley there were no complications    No orders of the defined types were placed in this encounter.    Chief Complaint  Patient presents with   Hand Pain    Left catching/ locking has knot palmar area base of thumb     History:  74 year old male history of catching and locking and triggering left and right long fingers now presents with locking catching triggering left thumb  Focused exam findings:  Tenderness over the A1 pulley of the left thumb with some clicking on flexion extension but no locking neurovascular exam intact  No results found.   "

## 2024-07-16 NOTE — Progress Notes (Signed)
" °  Intake history:  Chief Complaint  Patient presents with   Hand Pain    Left      BP 122/63 Comment: 07/09/24  Ht 5' 11 (1.803 m)   Wt 164 lb (74.4 kg)   BMI 22.87 kg/m  Body mass index is 22.87 kg/m.  Pharmacy? [facility]_  WHAT ARE WE SEEING YOU FOR TODAY?   Thumb left  How long has this bothered you? (DOI?DOS?WS?)    Was there an injury? No  Anticoag.  No   Any ALLERGIES ______________________________________________   Treatment:  Have you taken:  Tylenol  Yes  Advil  No  Had PT No  Had injection No  Other  _________________________     "

## 2024-07-16 NOTE — Telephone Encounter (Signed)
 Theres lidocaine  mixed with the steroid medicine, will be numb for a couple hours, I called him to advise he voiced understanding

## 2024-08-04 NOTE — Progress Notes (Signed)
 [name]                                          MRN: [id number]   08/04/2024   The [facility] Quality Team Specialist reviewed this patient medical record for the purposes of chart review for care gap closure. The following were reviewed: abstraction for care gap closure-controlling blood pressure.    [facility] Quality Team

## 2024-12-08 ENCOUNTER — Other Ambulatory Visit

## 2024-12-15 ENCOUNTER — Ambulatory Visit: Admitting: Urology
